# Patient Record
Sex: Male | Born: 1940 | Race: White | Hispanic: No | Marital: Married | Smoking: Current every day smoker
Health system: Southern US, Community
[De-identification: ages and names within clinical notes are randomized; demographics above are authoritative.]

## PROBLEM LIST (undated history)

## (undated) ENCOUNTER — Inpatient Hospital Stay: Admission: EM | Payer: Self-pay | Source: Home / Self Care

## (undated) DIAGNOSIS — E785 Hyperlipidemia, unspecified: Secondary | ICD-10-CM

## (undated) DIAGNOSIS — I10 Essential (primary) hypertension: Secondary | ICD-10-CM

## (undated) DIAGNOSIS — Z87442 Personal history of urinary calculi: Secondary | ICD-10-CM

## (undated) DIAGNOSIS — I714 Abdominal aortic aneurysm, without rupture, unspecified: Secondary | ICD-10-CM

## (undated) DIAGNOSIS — B9620 Unspecified Escherichia coli [E. coli] as the cause of diseases classified elsewhere: Secondary | ICD-10-CM

## (undated) DIAGNOSIS — T827XXA Infection and inflammatory reaction due to other cardiac and vascular devices, implants and grafts, initial encounter: Secondary | ICD-10-CM

## (undated) DIAGNOSIS — R7881 Bacteremia: Secondary | ICD-10-CM

## (undated) DIAGNOSIS — I219 Acute myocardial infarction, unspecified: Secondary | ICD-10-CM

## (undated) DIAGNOSIS — K5090 Crohn's disease, unspecified, without complications: Secondary | ICD-10-CM

## (undated) DIAGNOSIS — C801 Malignant (primary) neoplasm, unspecified: Secondary | ICD-10-CM

## (undated) DIAGNOSIS — C3490 Malignant neoplasm of unspecified part of unspecified bronchus or lung: Secondary | ICD-10-CM

## (undated) DIAGNOSIS — I739 Peripheral vascular disease, unspecified: Secondary | ICD-10-CM

## (undated) HISTORY — PX: CARDIAC CATHETERIZATION: SHX172

## (undated) HISTORY — PX: MOHS SURGERY: SUR867

## (undated) HISTORY — PX: CYSTOSCOPY: SUR368

## (undated) HISTORY — DX: Acute myocardial infarction, unspecified: I21.9

## (undated) HISTORY — DX: Infection and inflammatory reaction due to other cardiac and vascular devices, implants and grafts, initial encounter: T82.7XXA

## (undated) HISTORY — PX: CORONARY STENT PLACEMENT: SHX1402

## (undated) HISTORY — DX: Abdominal aortic aneurysm, without rupture: I71.4

## (undated) HISTORY — DX: Abdominal aortic aneurysm, without rupture, unspecified: I71.40

## (undated) HISTORY — PX: OTHER SURGICAL HISTORY: SHX169

## (undated) HISTORY — DX: Unspecified Escherichia coli (E. coli) as the cause of diseases classified elsewhere: B96.20

## (undated) HISTORY — DX: Bacteremia: R78.81

## (undated) HISTORY — DX: Peripheral vascular disease, unspecified: I73.9

## (undated) HISTORY — DX: Essential (primary) hypertension: I10

---

## 1998-06-16 ENCOUNTER — Emergency Department (HOSPITAL_COMMUNITY): Admission: EM | Admit: 1998-06-16 | Discharge: 1998-06-16 | Payer: Self-pay | Admitting: Emergency Medicine

## 1998-06-21 ENCOUNTER — Encounter: Payer: Self-pay | Admitting: Emergency Medicine

## 1998-06-21 ENCOUNTER — Encounter: Payer: Self-pay | Admitting: Urology

## 1998-06-21 ENCOUNTER — Observation Stay (HOSPITAL_COMMUNITY): Admission: EM | Admit: 1998-06-21 | Discharge: 1998-06-21 | Payer: Self-pay | Admitting: Emergency Medicine

## 2000-06-22 ENCOUNTER — Other Ambulatory Visit: Admission: RE | Admit: 2000-06-22 | Discharge: 2000-06-22 | Payer: Self-pay | Admitting: Gastroenterology

## 2000-06-30 ENCOUNTER — Ambulatory Visit (HOSPITAL_COMMUNITY): Admission: RE | Admit: 2000-06-30 | Discharge: 2000-06-30 | Payer: Self-pay | Admitting: Gastroenterology

## 2000-06-30 ENCOUNTER — Encounter (INDEPENDENT_AMBULATORY_CARE_PROVIDER_SITE_OTHER): Payer: Self-pay | Admitting: *Deleted

## 2004-09-28 DIAGNOSIS — I219 Acute myocardial infarction, unspecified: Secondary | ICD-10-CM

## 2004-09-28 HISTORY — DX: Acute myocardial infarction, unspecified: I21.9

## 2005-08-11 ENCOUNTER — Ambulatory Visit: Payer: Self-pay | Admitting: Cardiology

## 2005-08-11 ENCOUNTER — Inpatient Hospital Stay (HOSPITAL_COMMUNITY): Admission: AD | Admit: 2005-08-11 | Discharge: 2005-08-13 | Payer: Self-pay | Admitting: Cardiology

## 2005-08-13 IMAGING — CR DG CHEST 2V
3 series · 3 of 3 positions shown · non-contrast
Comparison: None.
CHEST - 2 VIEW:

CLINICAL DATA: Stent placement.

[view not recorded (1 of 3)]
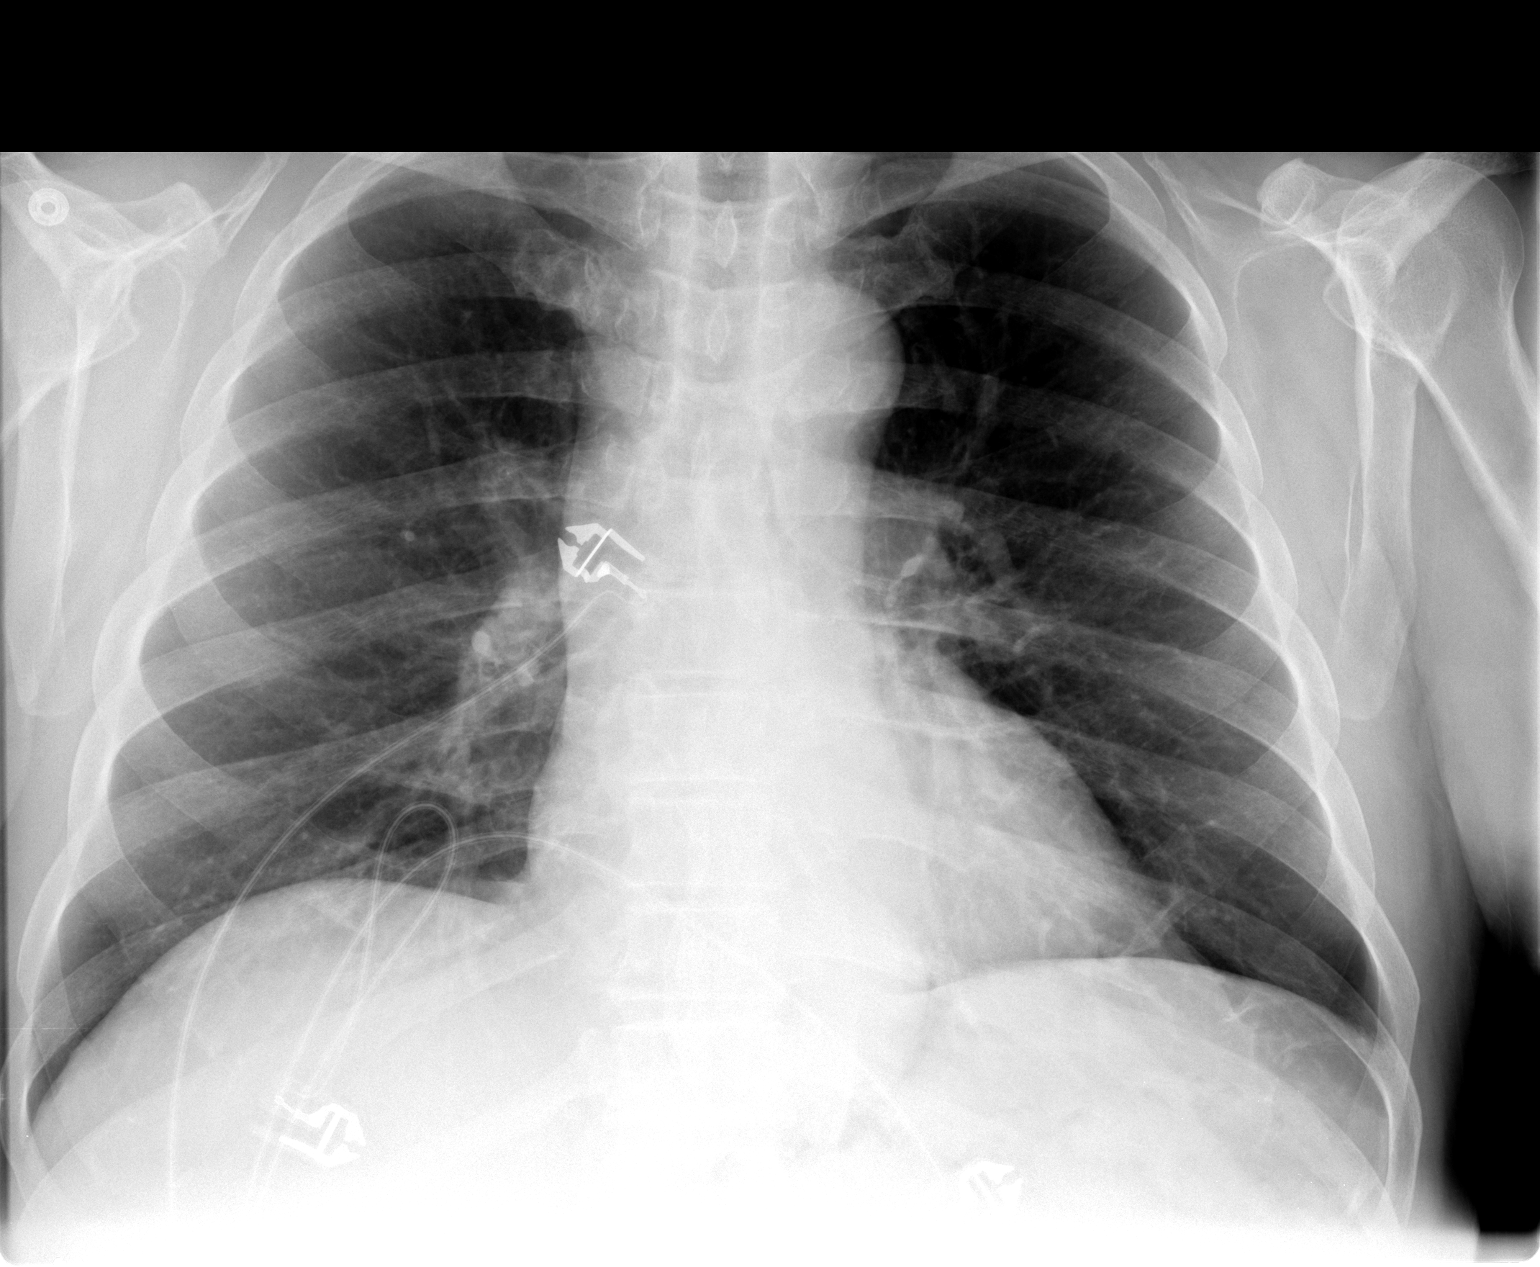

[view not recorded (2 of 3)]
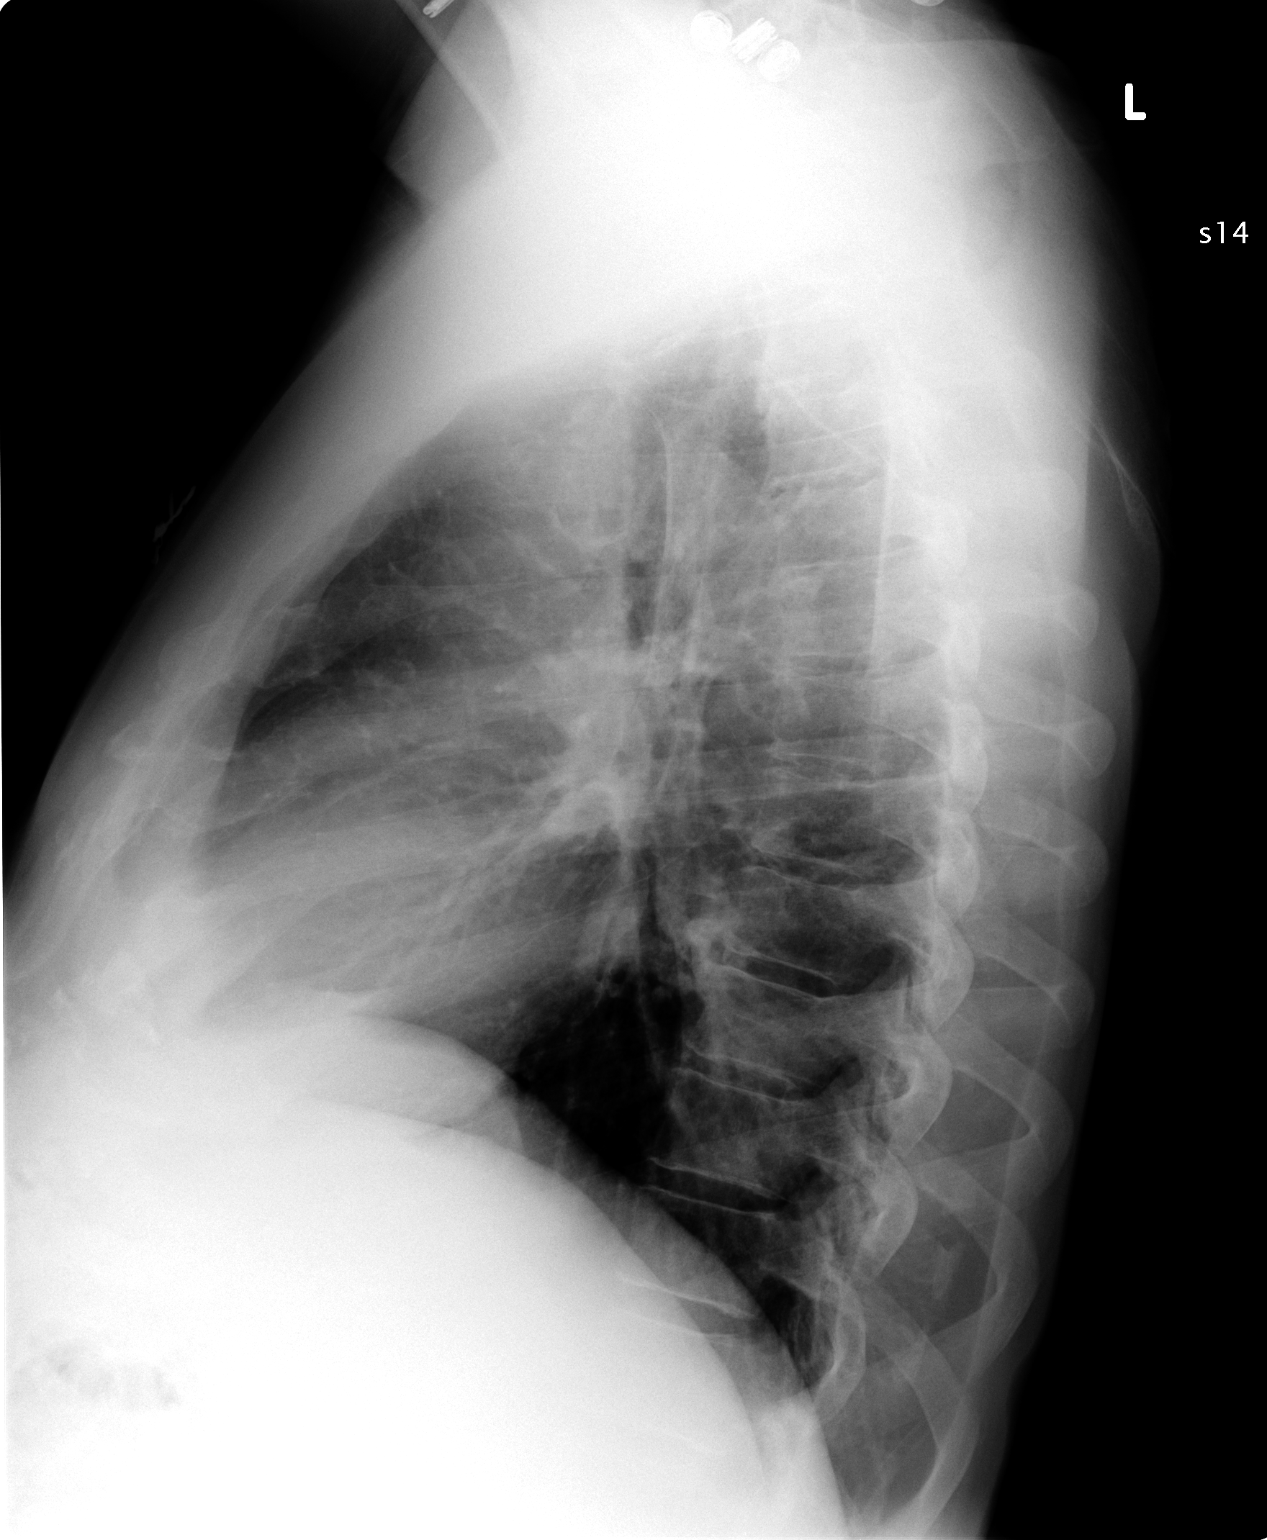

[view not recorded (3 of 3)]
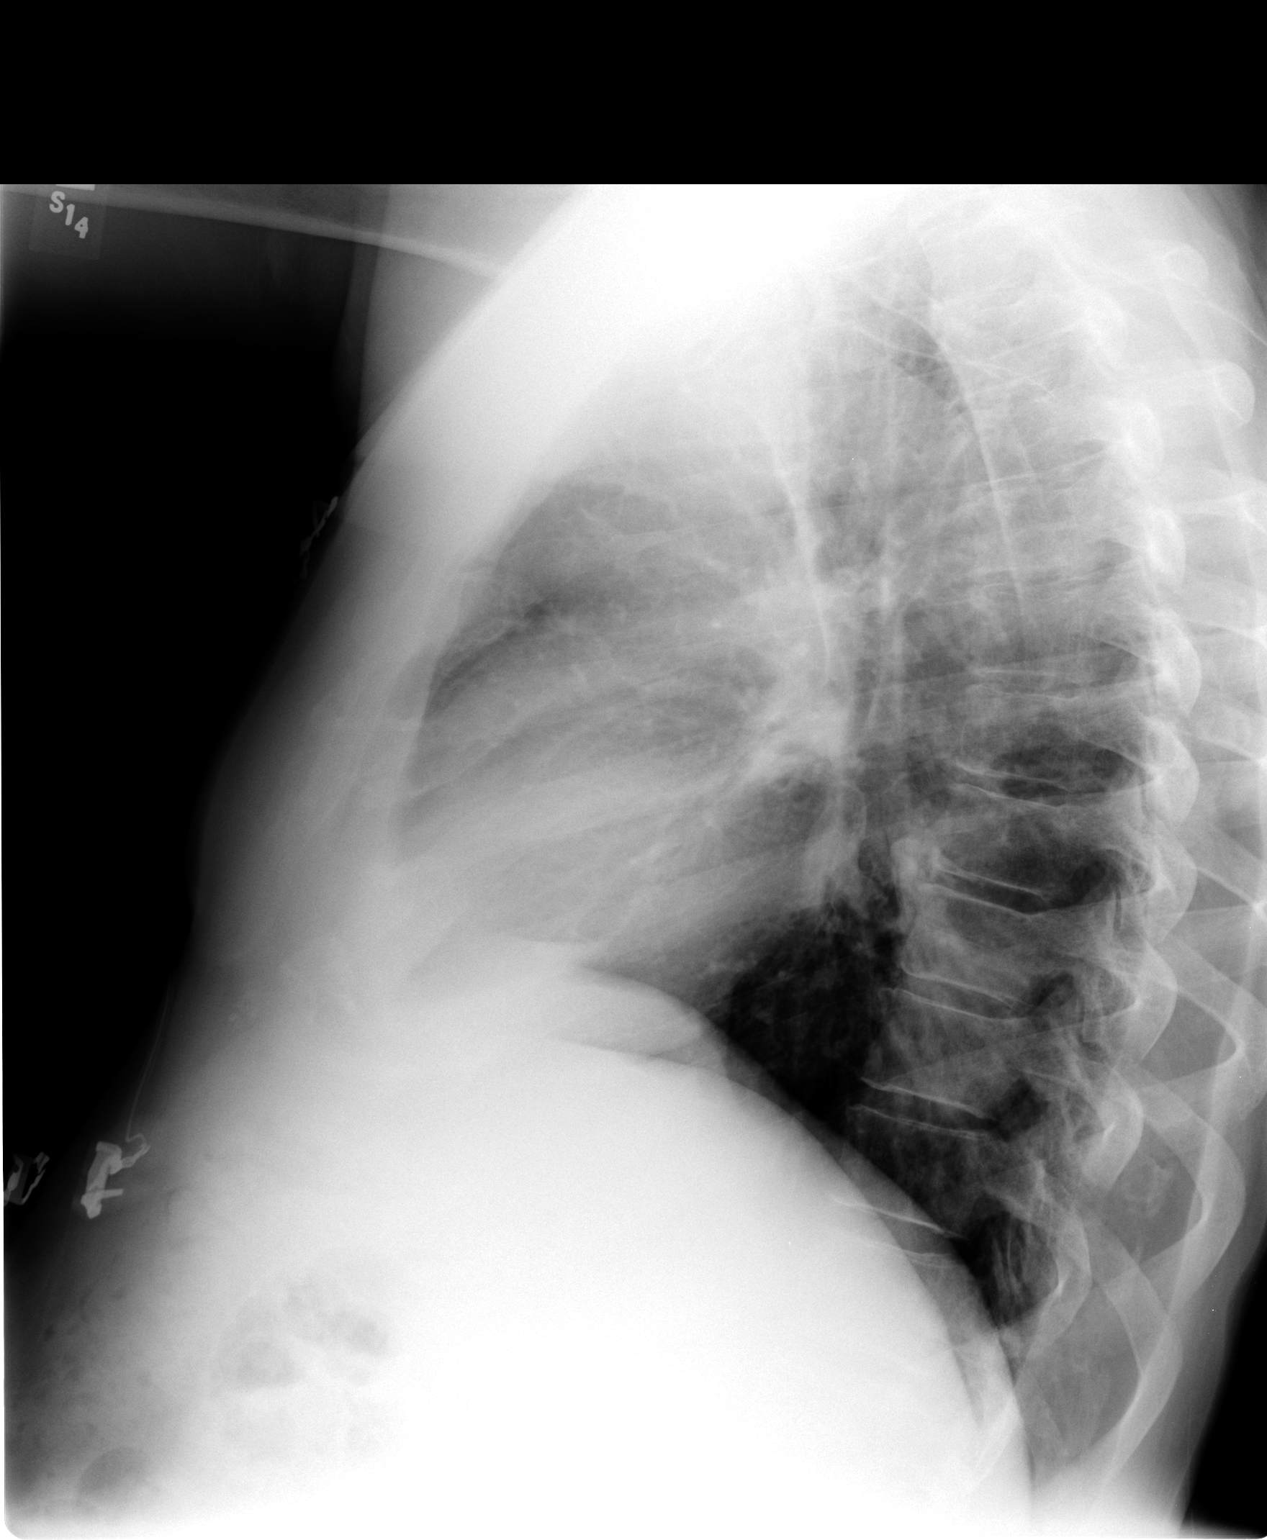

[3 of 3 positions shown; findings below may reference images not displayed]

FINDINGS: Lungs are clear.  Heart size is normal.  No effusion.  Rounded opacity in the lower chest is most compatible with a hiatal hernia.
IMPRESSION: No acute disease with a rounded opacity in the lower chest, most consistent with a hiatal hernia.

## 2005-08-28 ENCOUNTER — Ambulatory Visit: Payer: Self-pay | Admitting: Cardiovascular Disease

## 2006-01-27 ENCOUNTER — Encounter: Payer: Self-pay | Admitting: Emergency Medicine

## 2006-07-27 ENCOUNTER — Ambulatory Visit: Payer: Self-pay | Admitting: Cardiology

## 2010-02-26 DIAGNOSIS — I739 Peripheral vascular disease, unspecified: Secondary | ICD-10-CM

## 2010-02-26 HISTORY — DX: Peripheral vascular disease, unspecified: I73.9

## 2010-03-03 HISTORY — PX: ABDOMINAL AORTIC ANEURYSM REPAIR: SUR1152

## 2010-03-09 ENCOUNTER — Emergency Department (HOSPITAL_COMMUNITY): Admission: EM | Admit: 2010-03-09 | Discharge: 2010-03-09 | Payer: Self-pay | Admitting: Emergency Medicine

## 2010-03-09 IMAGING — CT CT ANGIO PELVIS
3 of 11 series · 11 of 46 positions shown, 17 images · IV contrast (APPLIED)
Comparison: None.

CTA ABDOMEN

CLINICAL DATA: THE PATIENT IS POSTOP DAY #6 FROM LOBE AND ABDOMINAL
AORTIC ANEURYSM REPAIR.  HE SNEEZED EARLIER TODAY AND SOME FLUID
LEAKED FROM THE INCISION SITE.

CT ANGIOGRAPHY OF ABDOMEN AND PELVIS WITHOUT AND/OR WITH CONTRAST -
AAA PROTOCOL
TECHNIQUE: Multidetector CT imaging of the abdomen and pelvis was
performed before and during bolus injection of intravenous
contrast.  Multiplanar CT angiographic image reconstructions were
also generated to evaluate the vascular structures.
Contrast: 100 ml [YN]

[Series 6: dissection 2.0 st · axial · 0.74mm/px · z∈[-514,-258]mm · 5 of 276 slices shown]
[im 19/276  soft-tissue]
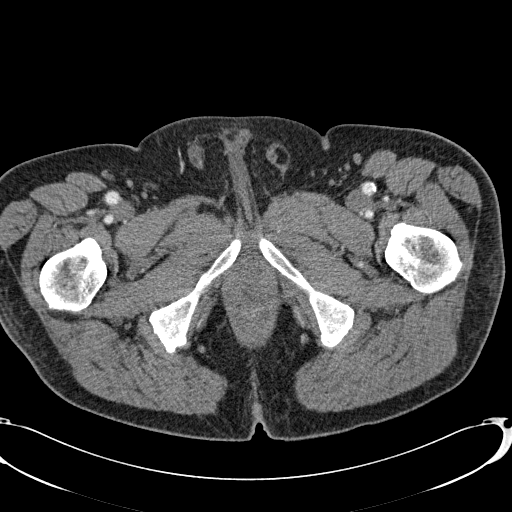
[im 56/276  soft-tissue]
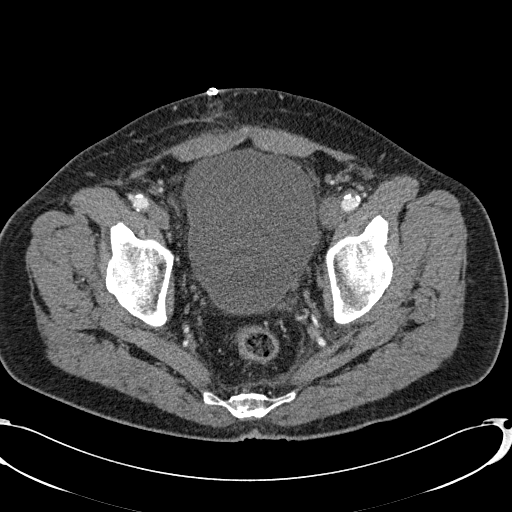
[im 92/276  soft-tissue]
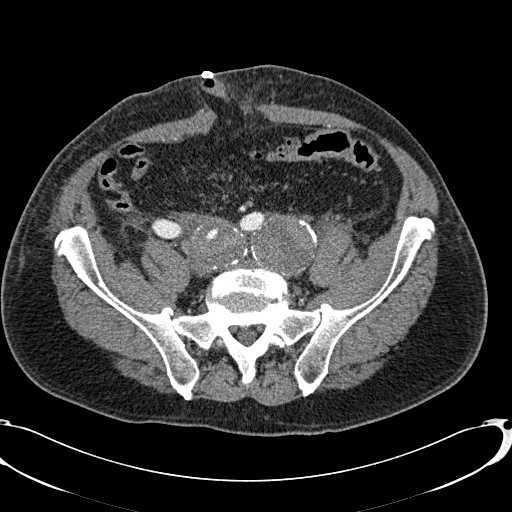
[im 129/276  soft-tissue]
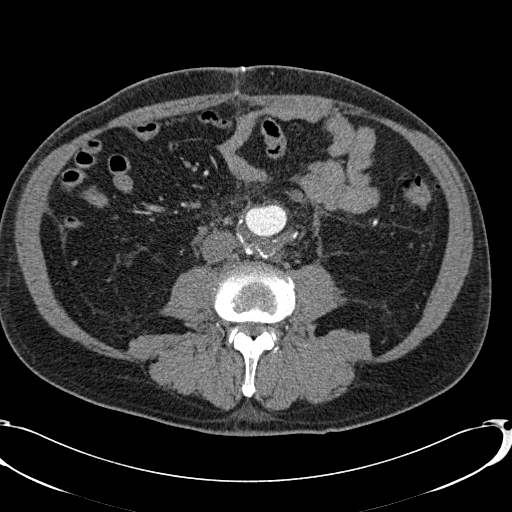
[im 147/276  soft-tissue]
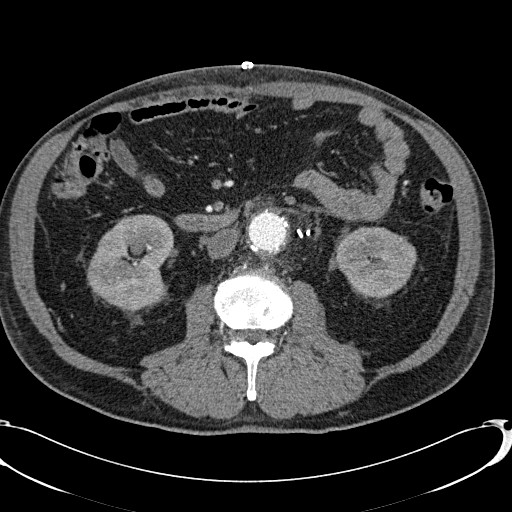

[Series 9: venous 5.0 st · axial · portal-venous · 0.74mm/px · z∈[-460,-96]mm · 5 of 111 slices shown, 10 images]
[im 19/111  soft-tissue]
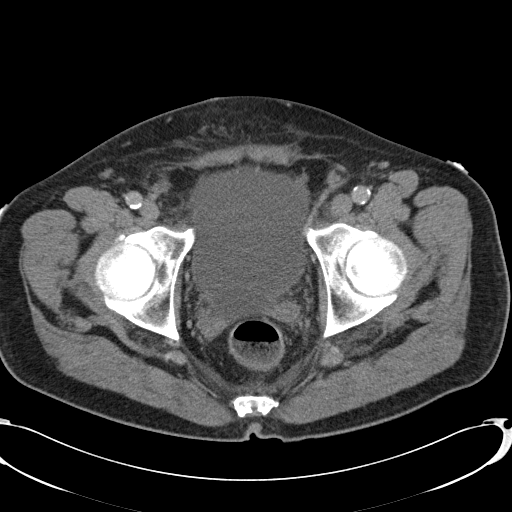
[im 19/111  bone]
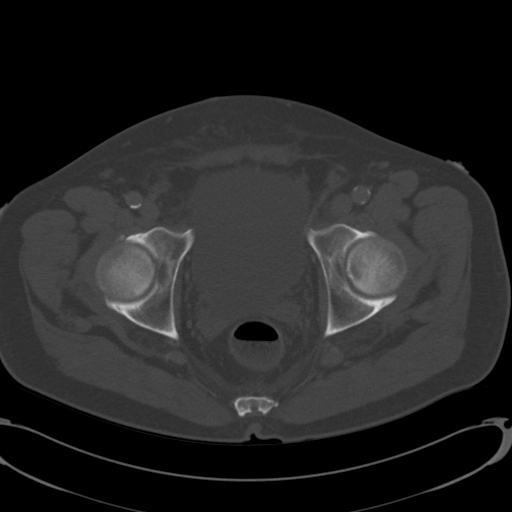
[im 37/111  soft-tissue]
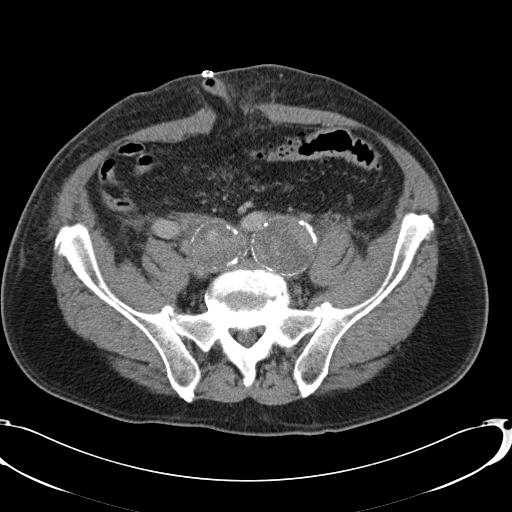
[im 37/111  lung]
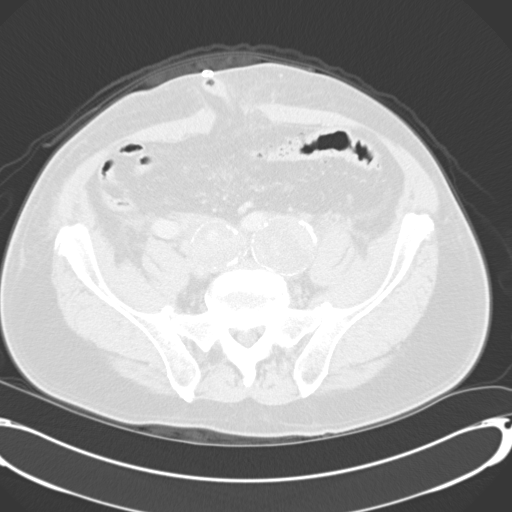
[im 56/111  soft-tissue]
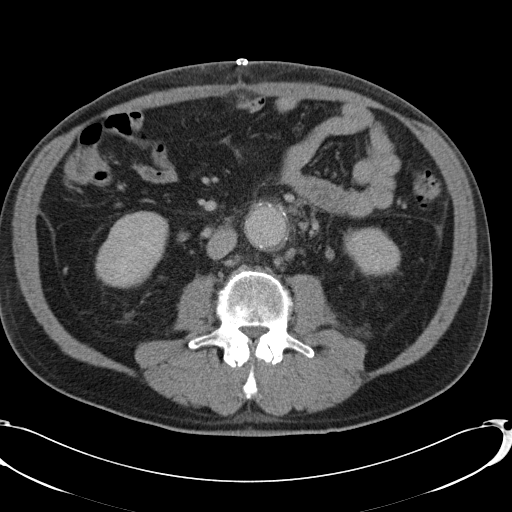
[im 56/111  lung]
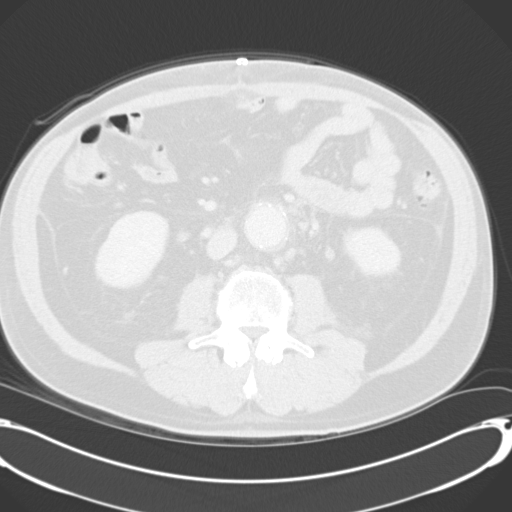
[im 74/111  soft-tissue]
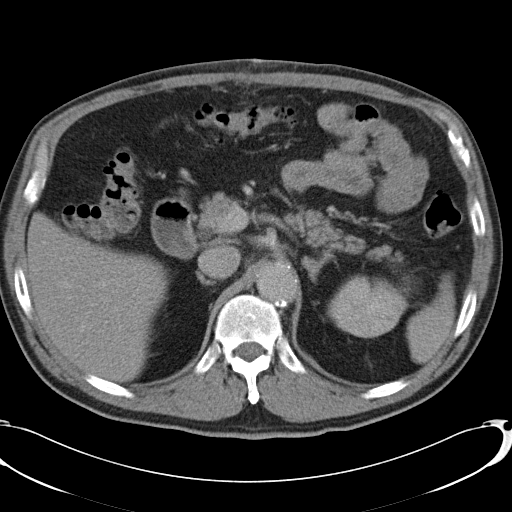
[im 74/111  lung]
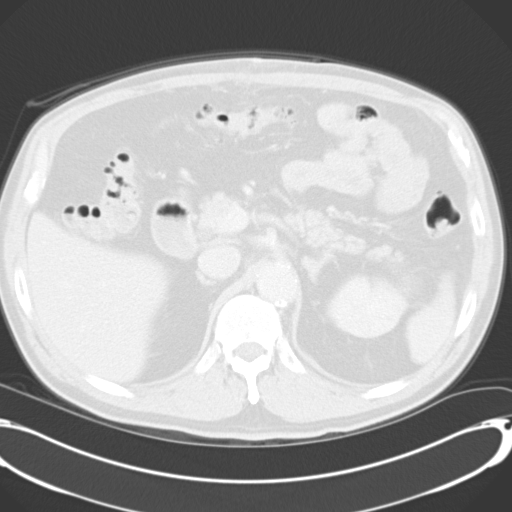
[im 92/111  soft-tissue]
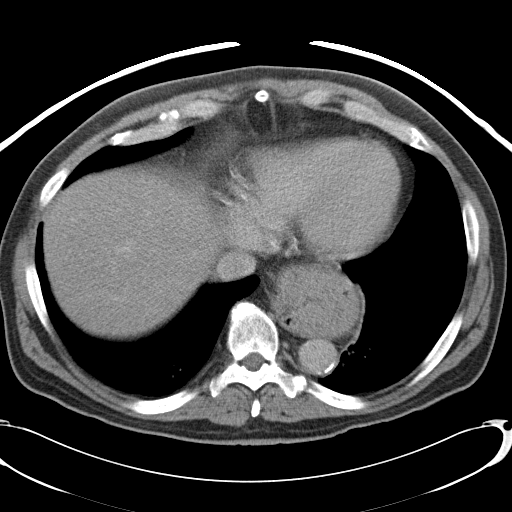
[im 92/111  lung]
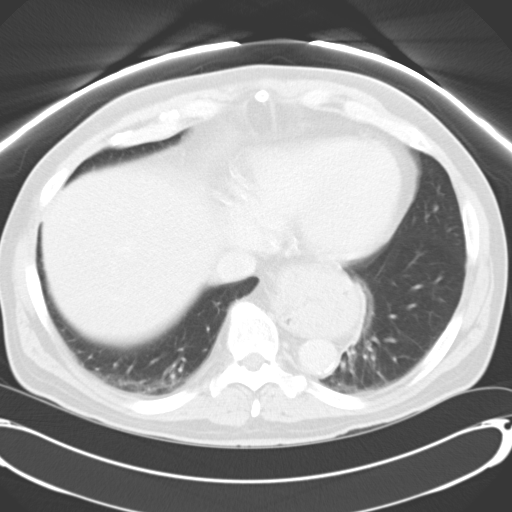

[Series 608: cor a/p venous · coronal · portal-venous · 1.08mm/px · 1 of 92 slices shown, 2 images]
[im 46/92  soft-tissue]
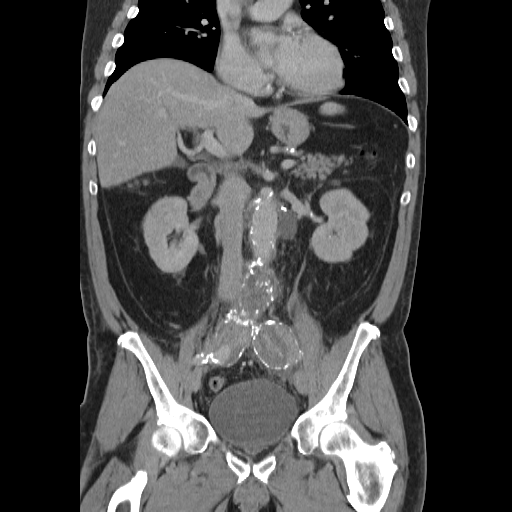
[im 46/92  bone]
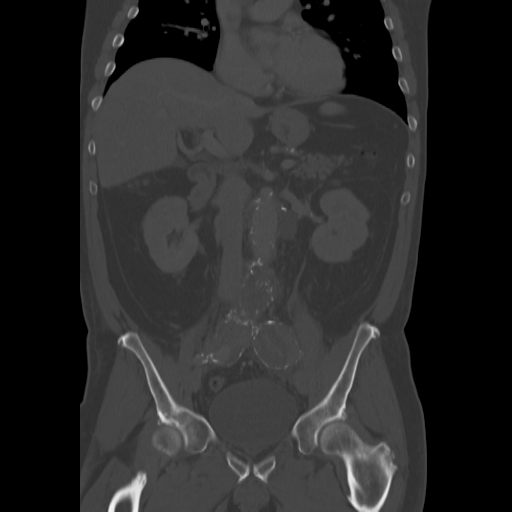

[11 of 46 positions shown; findings below may reference images not displayed]

FINDINGS: There is some minimal dependent atelectasis in the lower
lobes bilaterally.  The patient is status post aorto bi-iliac
bypass grafting.  Proximal anastomoses is at the level of the renal
arteries.  Both renal arteries are patent and renal perfusion is
symmetric.  There is some subtle irregularity at the anastomosis,
but not unexpected for 6 days from surgery.  2 x 3.8 cm fluid
collection is seen to the left of the aorta distal to the renal
arteries.  This contains some air locules.  The collection probably
represents a postsurgical seroma or hematoma and the gas is not
unexpected 6 days from surgery.  Opacification of the graft lumen
and iliac limbs is evident.  Distal anastomoses are patent
bilaterally.  The patient has bilateral common iliac artery
aneurysms and there is retrograde opacification of the right iliac
aneurysm.  Gas and thrombus are visible in the aneurysm sacs
bilaterally, probably related to the recent surgery.

No focal abnormalities seen in the liver or spleen.  Moderate
hiatal hernia is evident.  Duodenum, pancreas, gallbladder, and
adrenal glands have normal features.  The slightly heterogeneous
perfusion of the right renal cortex is probably related to recent
surgery.

No intraperitoneal free fluid.  No free air is identified in the
peroneal cavity.  There is no bowel obstruction.

The patient is noted to have a fascial defect at the midline, deep
to the midline incision.  Small bowel loops extend through the
fascial defects to a position just deep to the skin.
IMPRESSION: Status post aortobi-iliac bypass grafting.  The proximal and distal
anastomoses are patent.  There is some fluid and gas in the
retroperitoneal tissues around the graft which is not unexpected 6
days surgery.  The patient does have some retrograde opacification
of the right common iliac artery.

Midline fascial defect caudal to the umbilicus contains herniated
small bowel loops which track just deep to the midline skin
staples. No underlying subcutaneous fluid collection associated
with the staple line to suggest abscess or hematoma.

CTA PELVIS
FINDINGS: Symmetric opacification of the common femoral arteries is
noted.  There is no intraperitoneal free fluid.  Tiny air locule in
the bladder is probably secondary to recent instrumentation.  The
bilateral inguinal hernias contain only fat.  There is
diverticulosis of the sigmoid colon without diverticulitis.  The
terminal ileum is normal.  The appendix is normal.

Bone windows reveal no worrisome lytic or sclerotic osseous
lesions.
IMPRESSION: No acute findings in the pelvis.

## 2010-03-16 ENCOUNTER — Inpatient Hospital Stay (HOSPITAL_COMMUNITY): Admission: EM | Admit: 2010-03-16 | Discharge: 2010-03-18 | Payer: Self-pay | Admitting: Emergency Medicine

## 2010-03-16 IMAGING — CT CT ABD-PELV W/ CM
2 of 5 series · 15 of 46 positions shown, 17 images · IV contrast (agent unspecified)
Comparison: [DATE]

CLINICAL DATA: Abdominal distention.  Incision is draining.  Nausea
vomiting.

CT ABDOMEN AND PELVIS WITH CONTRAST
TECHNIQUE: Multidetector CT imaging of the abdomen and pelvis was
performed following the standard protocol during bolus
administration of intravenous contrast.
Contrast: 80 ml [W6]

[Series 2: rtn ap with st · axial · 0.82mm/px · z∈[-440,-10]mm · 12 of 96 slices shown, 14 images]
[im 5/96  soft-tissue]
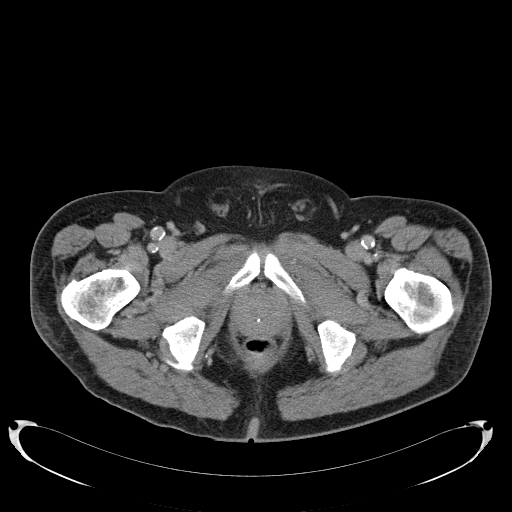
[im 5/96  bone]
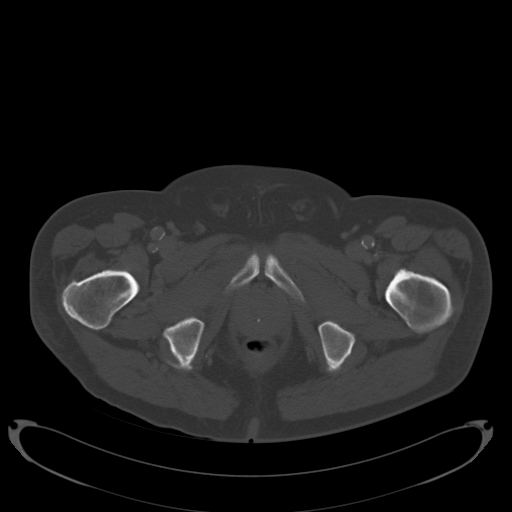
[im 15/96  soft-tissue]
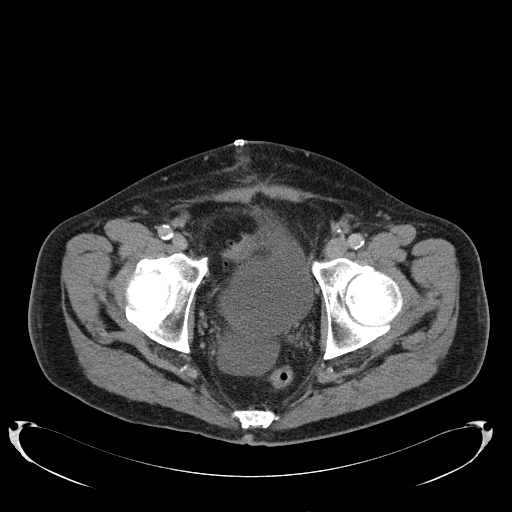
[im 20/96  soft-tissue]
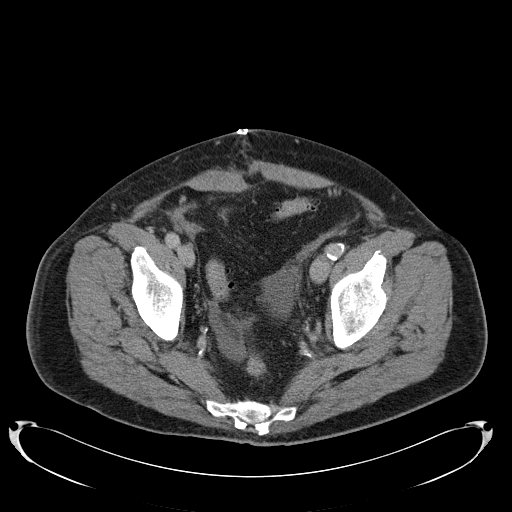
[im 29/96  soft-tissue]
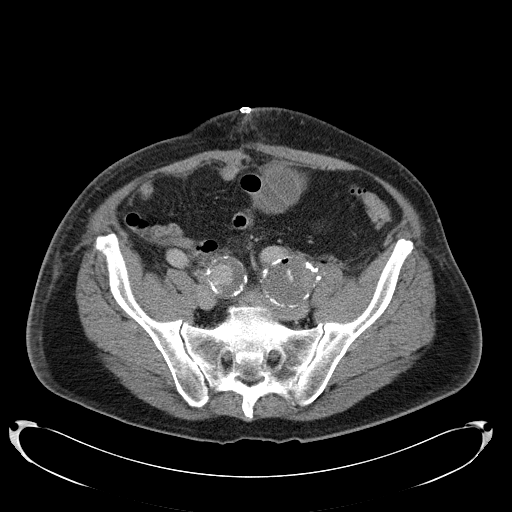
[im 39/96  soft-tissue]
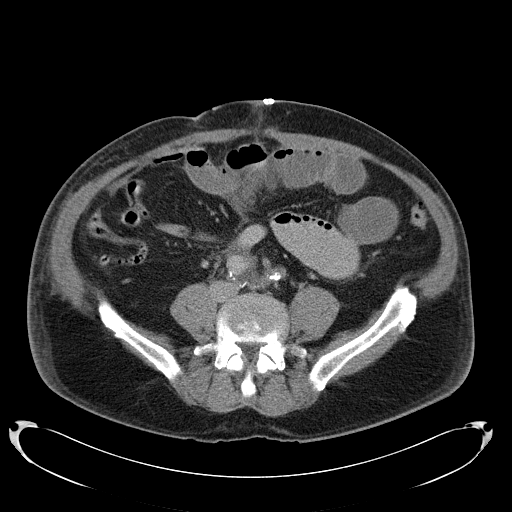
[im 43/96  soft-tissue]
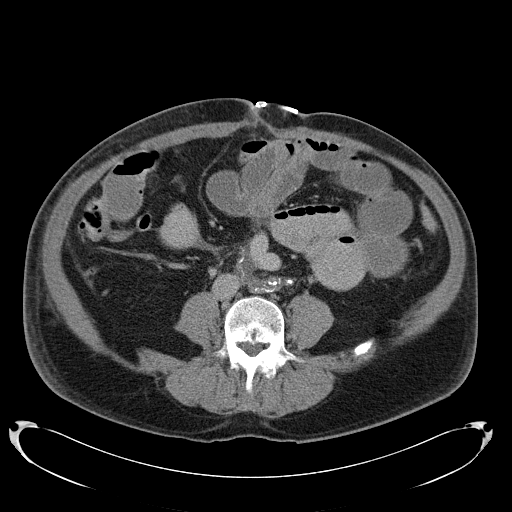
[im 53/96  soft-tissue]
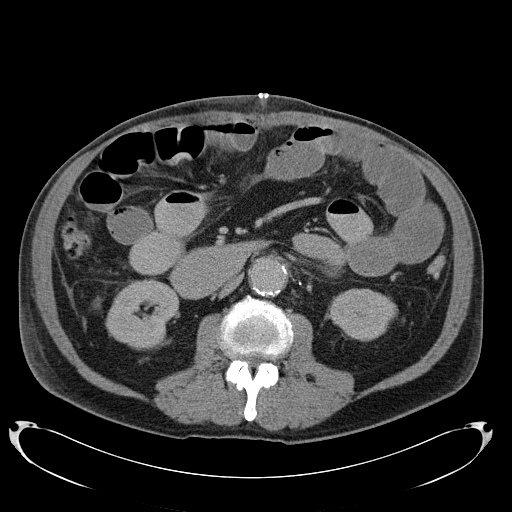
[im 58/96  soft-tissue]
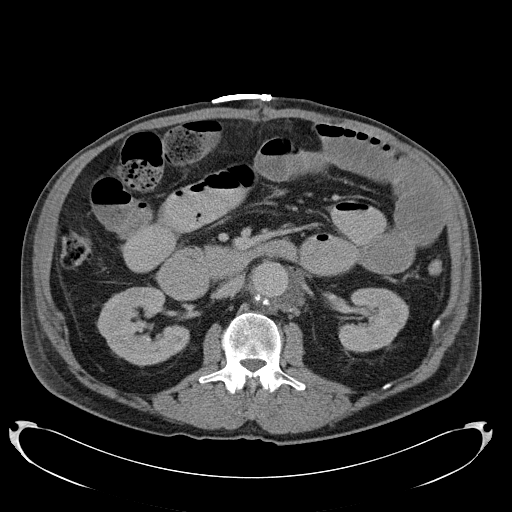
[im 67/96  soft-tissue]
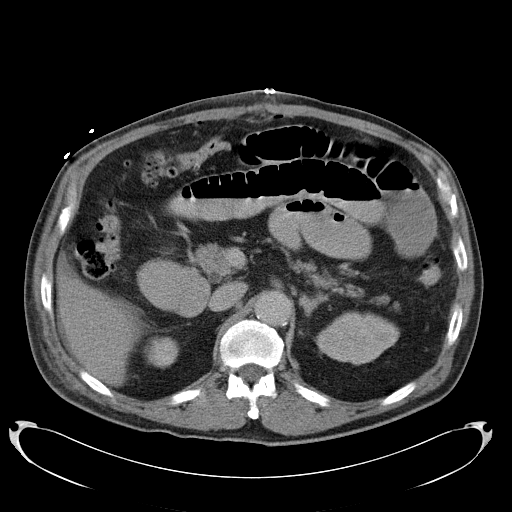
[im 67/96  bone]
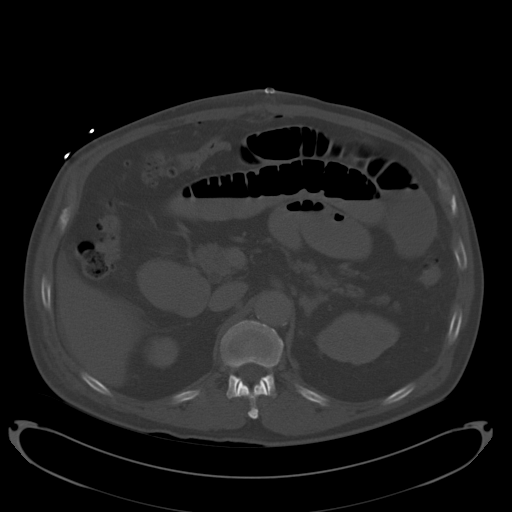
[im 77/96  soft-tissue]
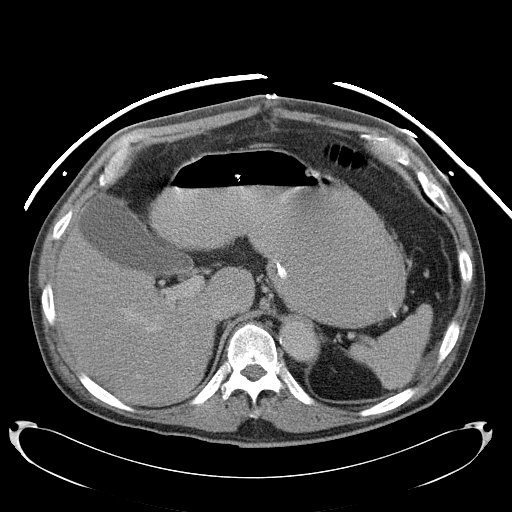
[im 81/96  soft-tissue]
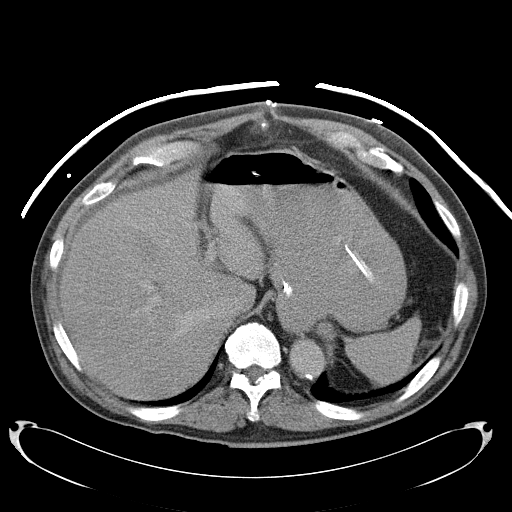
[im 91/96  soft-tissue]
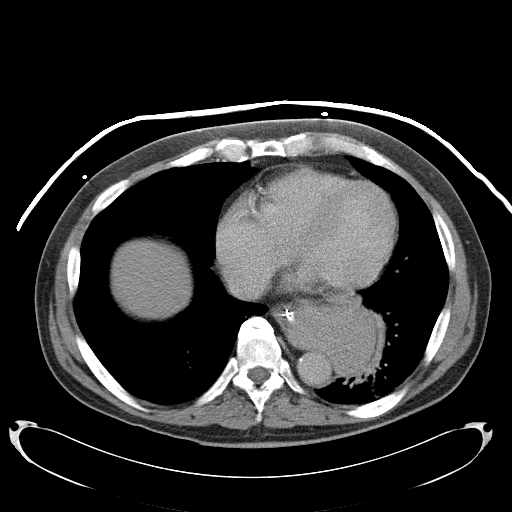

[Series 602: coronal · coronal · 0.97mm/px · 3 of 94 slices shown]
[im 32/94  soft-tissue]
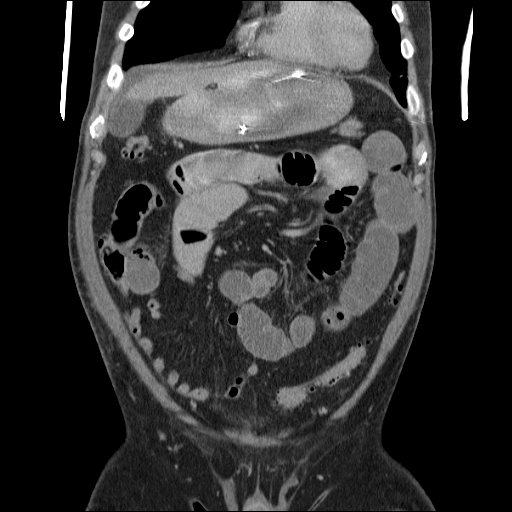
[im 42/94  soft-tissue]
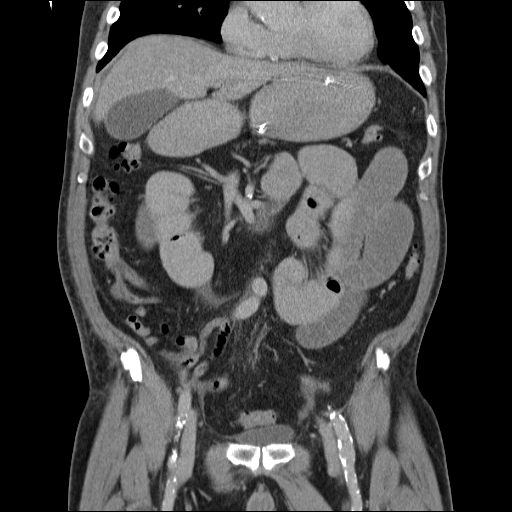
[im 52/94  soft-tissue]
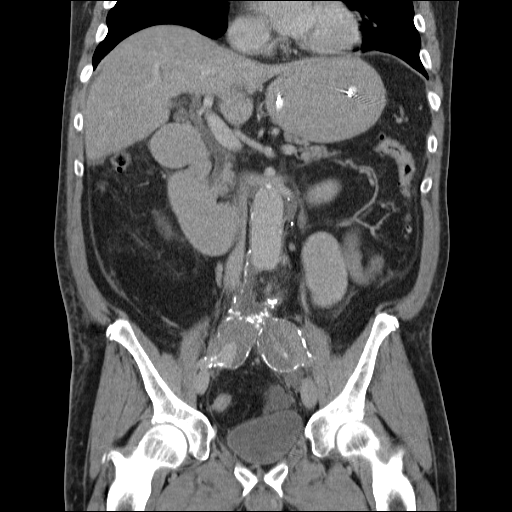

[15 of 46 positions shown; findings below may reference images not displayed]

FINDINGS: Posterior left lower lobe atelectasis is evident.

No focal abnormalities seen in the liver or spleen.  Moderate
hiatal hernia is evident.  NG tube is in the gastric fundus.
Stomach is mildly distended.  Duodenum is mildly distended.
Jejunal loops in the left upper quadrant are distended up to 4 cm
in diameter and are fluid filled.

Retention sutures are now seen at the midline.  By report, the
patient was reoperated on [DATE] for ventral hernia repair.
Numerous tiny air locules under the rectus fascia are not
unexpected 6 days out from surgery.  The herniated bowel loops seen
in the region of the midline incision on the previous study have
been reduced in the interval.  A tiny 2.8 x 0.9 cm fluid collection
is seen just deep to the midline rectus sheath, about 4 cm cranial
to the umbilicus.  This shows no rim enhancement or organization.

The dilated small bowel loops tracking into the central abdomen
where they become decompressed.  The precise transition zone is not
identified, but there is a region of adhesions in the central
abdomen, at about the level of the umbilicus, at the midline.  This
is immediately anterior to the proximal right iliac graft.  Small
bowel loops in the right lower quadrant are collapsed and
decompressed.  The colon is also decompressed.  A small bowel feces
sign is identified in the anterior right abdomen, in the region of
the small bowel transition.

The left para-aortic fluid collection, in the region of the
proximal anastomoses, has decreased in size in the interval,
measuring 13 x 32 mm today compared to 20 x 38 mm previously.
Similarly, the fluid which was seen around the graft bifurcation on
the previous study has decreased.  The gas locules seen in the
native common iliac artery aneurysms on the previous exam have also
decreased although there is still some residual gas on the left.
As before, there is retrograde filling of the right common iliac
artery aneurysm.

Small to moderate amount of free fluid is seen in the pelvis.  The
bladder is distended.  Prostate gland is enlarged.  Diffuse
diverticulosis of the sigmoid colon noted without diverticulitis.
IMPRESSION: Features consistent with small bowel obstruction.  Although the
exact transition point cannot be identified, it appears to be
within the central abdomen, towards the midline, at about the level
of the umbilicus.  There is evidence of adhesions in this region
and the adhesions are immediately anterior to the proximal right
common iliac artery graft.  No associated small bowel wall
thickening at this time.  No pneumatosis.

Small to moderate free fluid in the pelvis.

Intraperitoneal free air locules deep to the rectus sheath are not
unexpected 6 days out from revision of the patient's midline
incision.  Retention sutures are now visible.  There is no evidence
for bowel herniation through the rectus fascia as was seen on the
previous study.  A very tiny fluid collection is seen in the
intraperitoneal cavity, just deep to the incision as described
above.

## 2010-03-16 IMAGING — CR DG ABDOMEN ACUTE W/ 1V CHEST
4 series · 4 of 4 positions shown · non-contrast
Comparison: Comparison chest x-ray [DATE].  Comparison CT of
the abdomen pelvis [DATE].

CLINICAL DATA: Infusion the drainage.  Status post abdominal aortic
aneurysm repair

ACUTE ABDOMEN SERIES (ABDOMEN 2 VIEW & CHEST 1 VIEW)

[w chest pa]
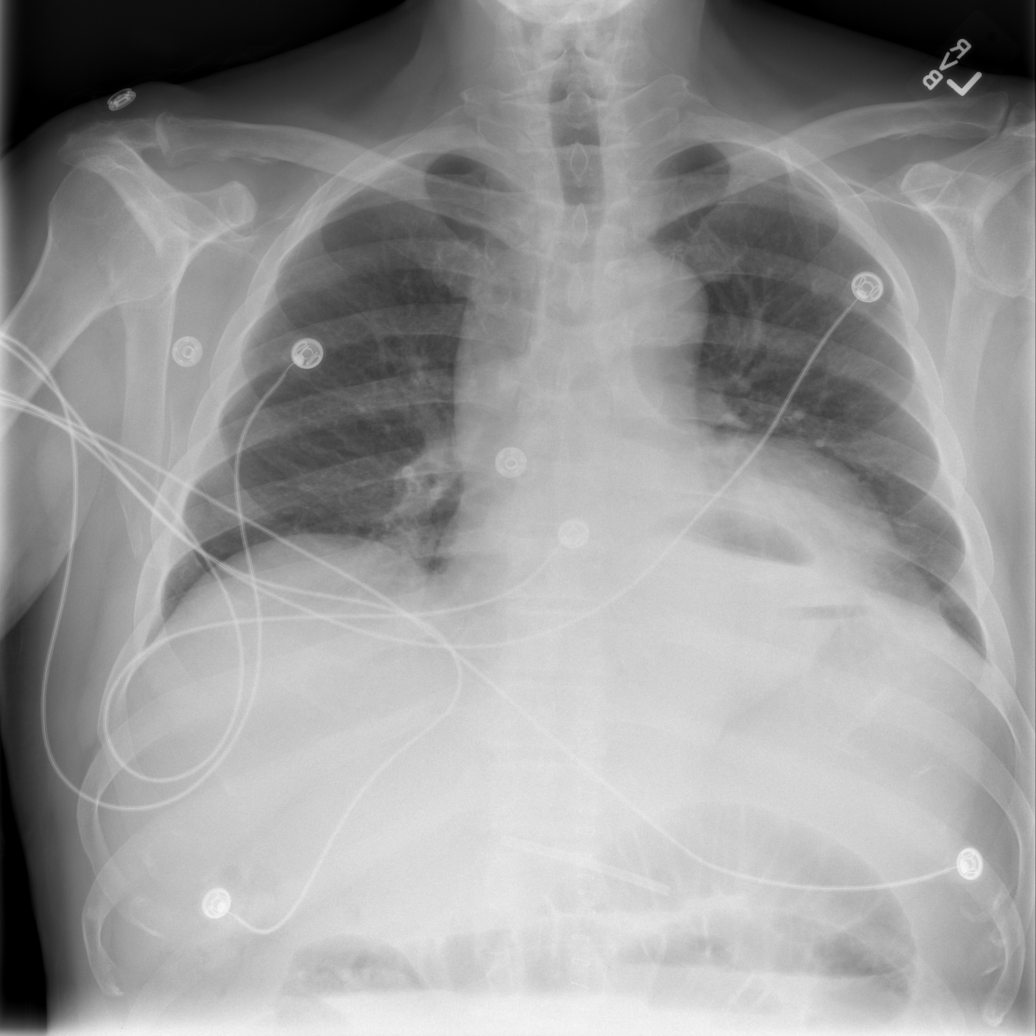

[w abdomen upright *]
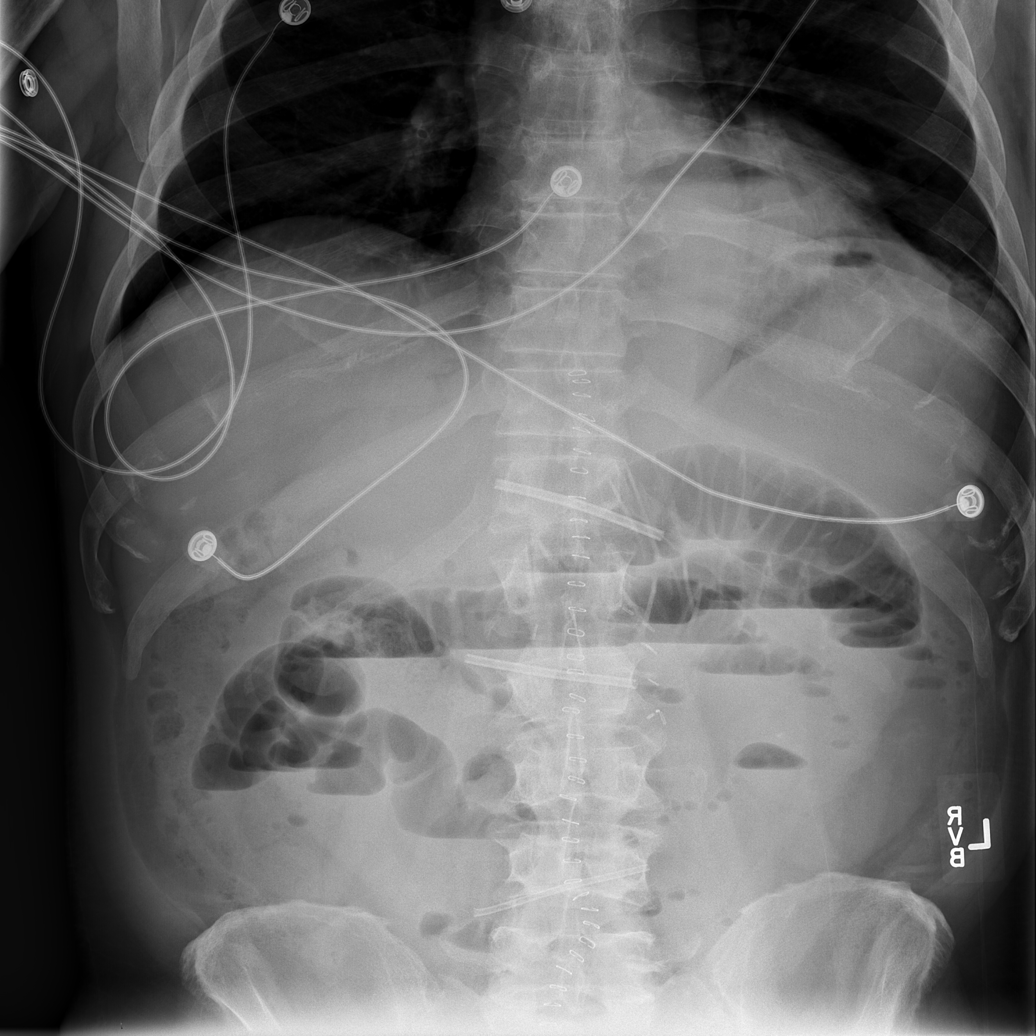

[t abdomen supine (1 of 2)]
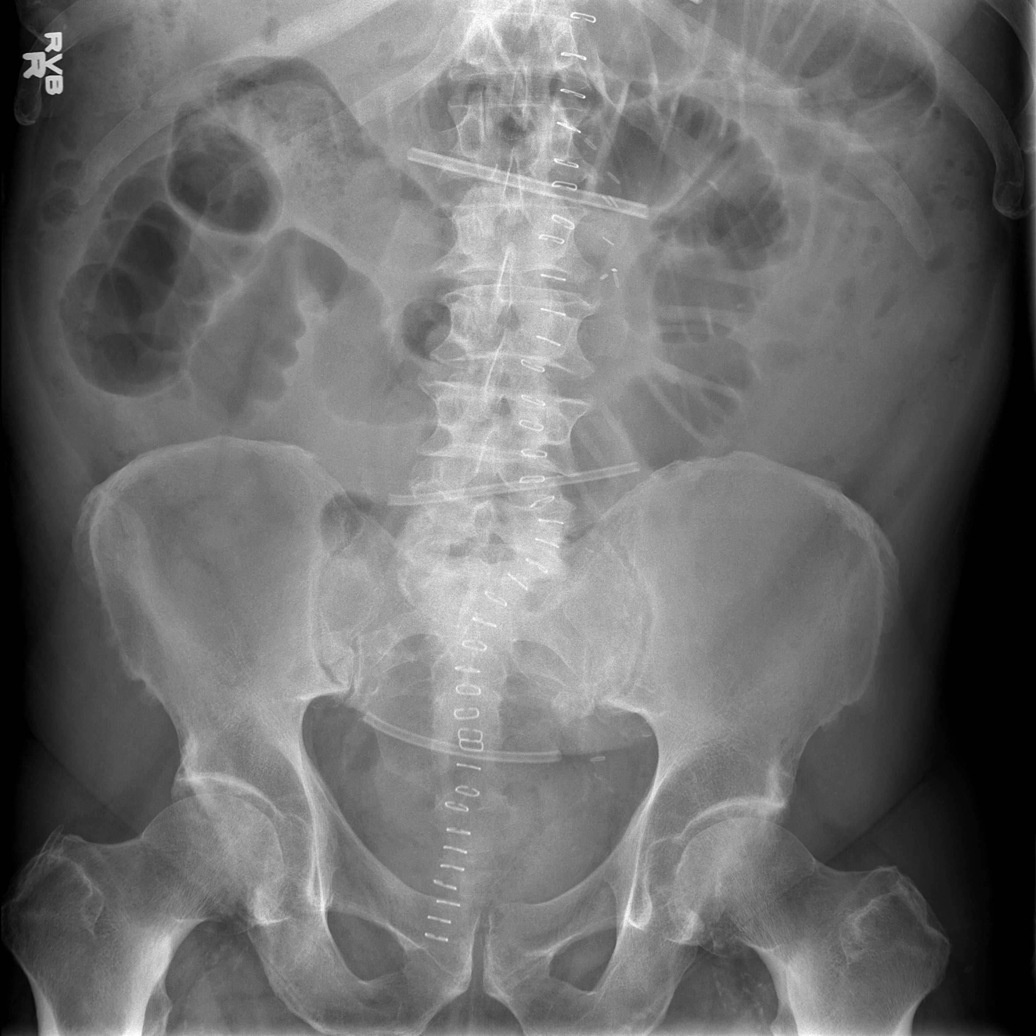

[t abdomen supine (2 of 2)]
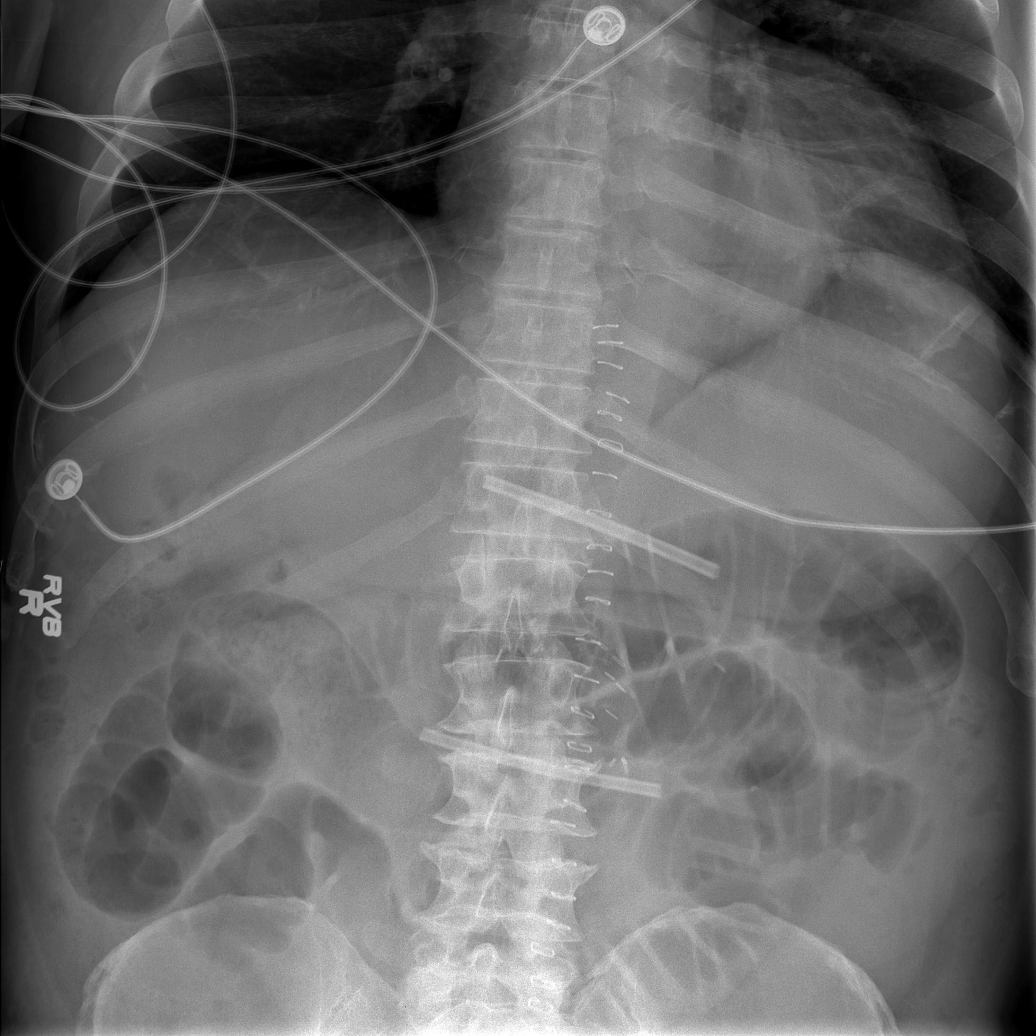

[4 of 4 positions shown; findings below may reference images not displayed]

FINDINGS: Cardiomegaly.  Tortuous aorta.  Basilar subsegmental
atelectasis.

Abnormal bowel gas pattern suggestive of small bowel obstruction
without free intraperitoneal air.  The small bowel loops measure up
to 5.5 cm.  On the prior CT, small bowel loops traversed through
anterior incision.  This cannot be assessed on the frontal views.

Hiatal hernia.
IMPRESSION: Small bowel obstructive pattern without free intraperitoneal air.
Please see above discussion.

Hiatal hernia.

Cardiomegaly.

Mildly tortuous aorta.

## 2010-06-10 ENCOUNTER — Encounter: Admission: RE | Admit: 2010-06-10 | Discharge: 2010-06-10 | Payer: Self-pay | Admitting: Family Medicine

## 2010-06-10 IMAGING — CR DG CHEST 2V
2 series · 2 of 2 positions shown · non-contrast
Comparison: [DATE]

CLINICAL DATA: Fever

CHEST - 2 VIEW

[w chest pa]
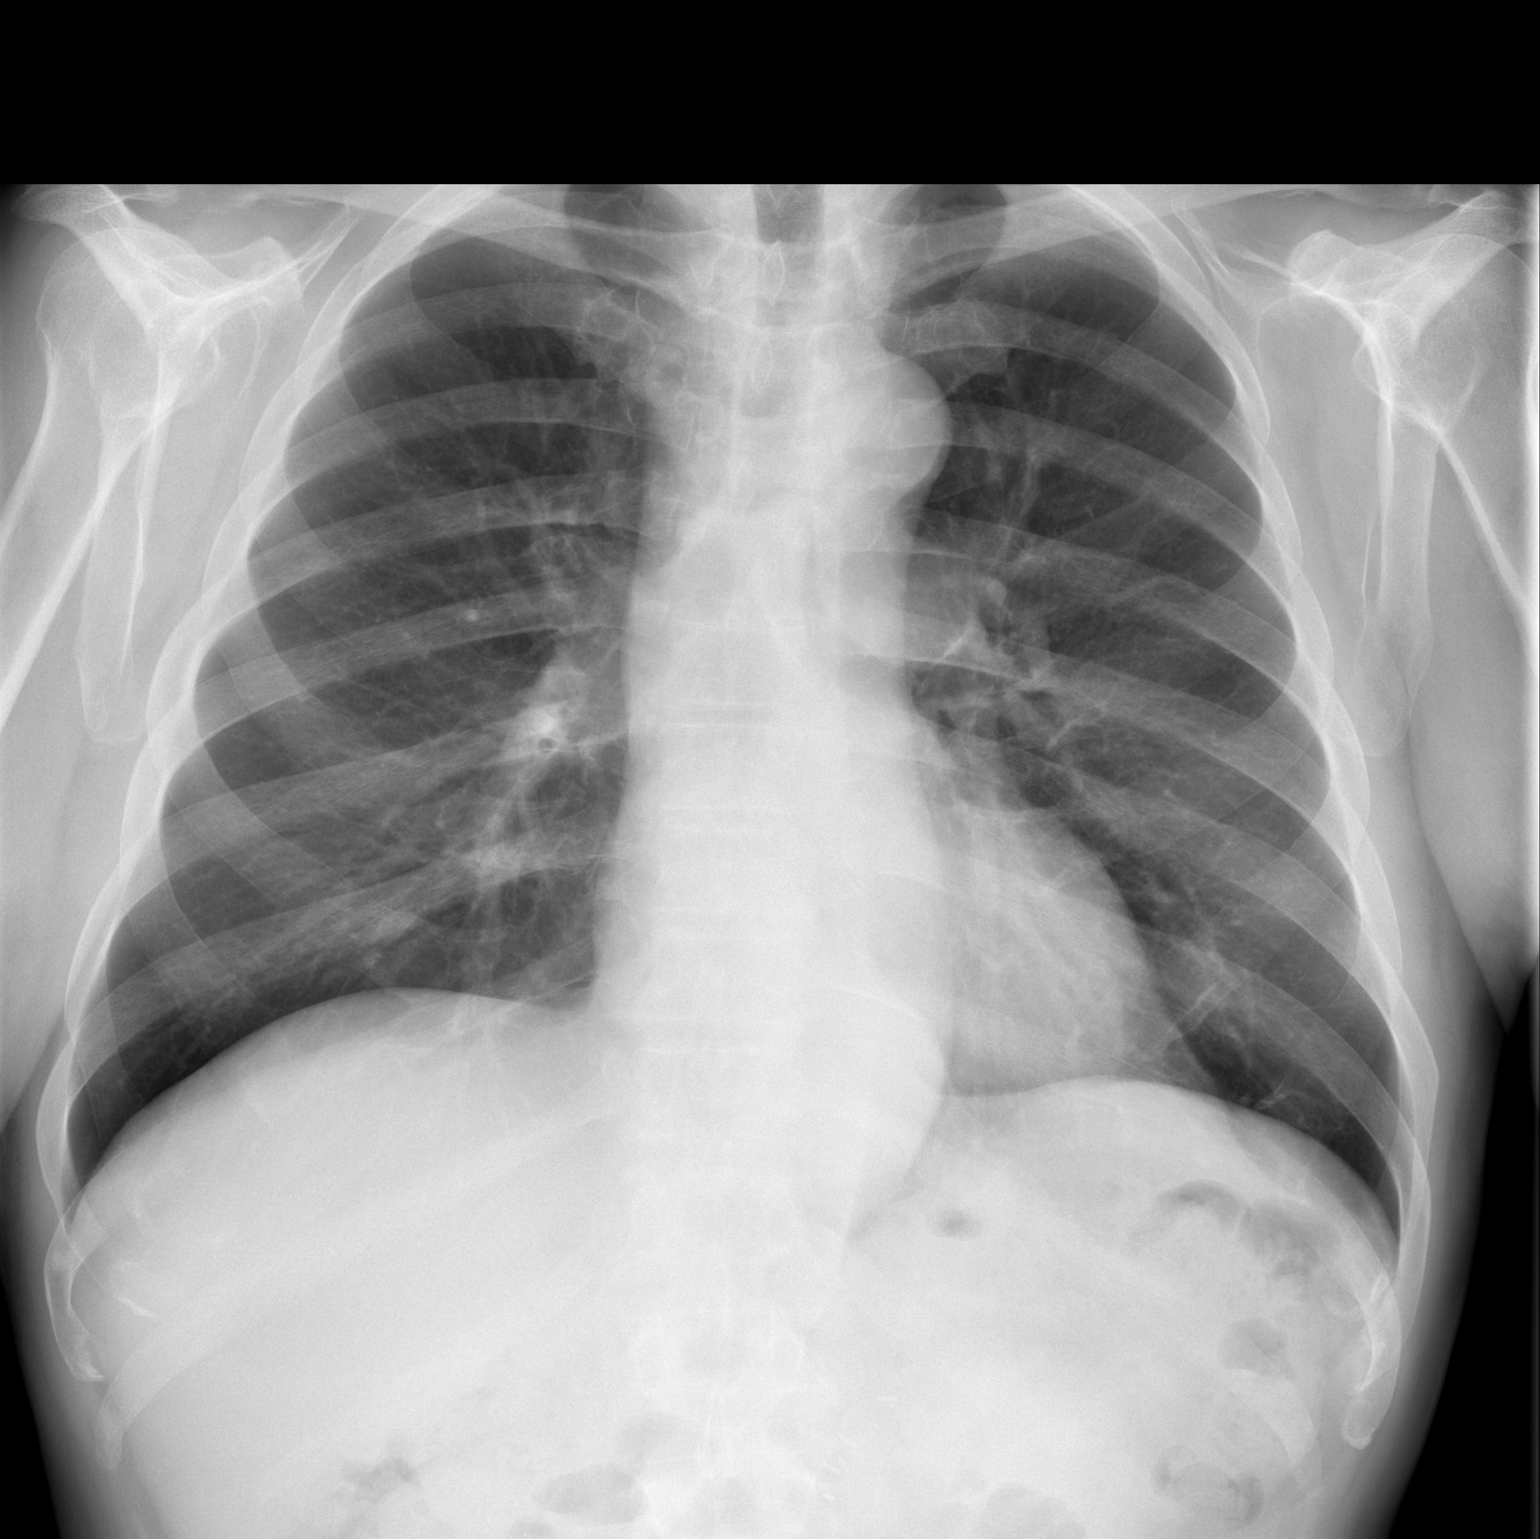

[w chest lat]
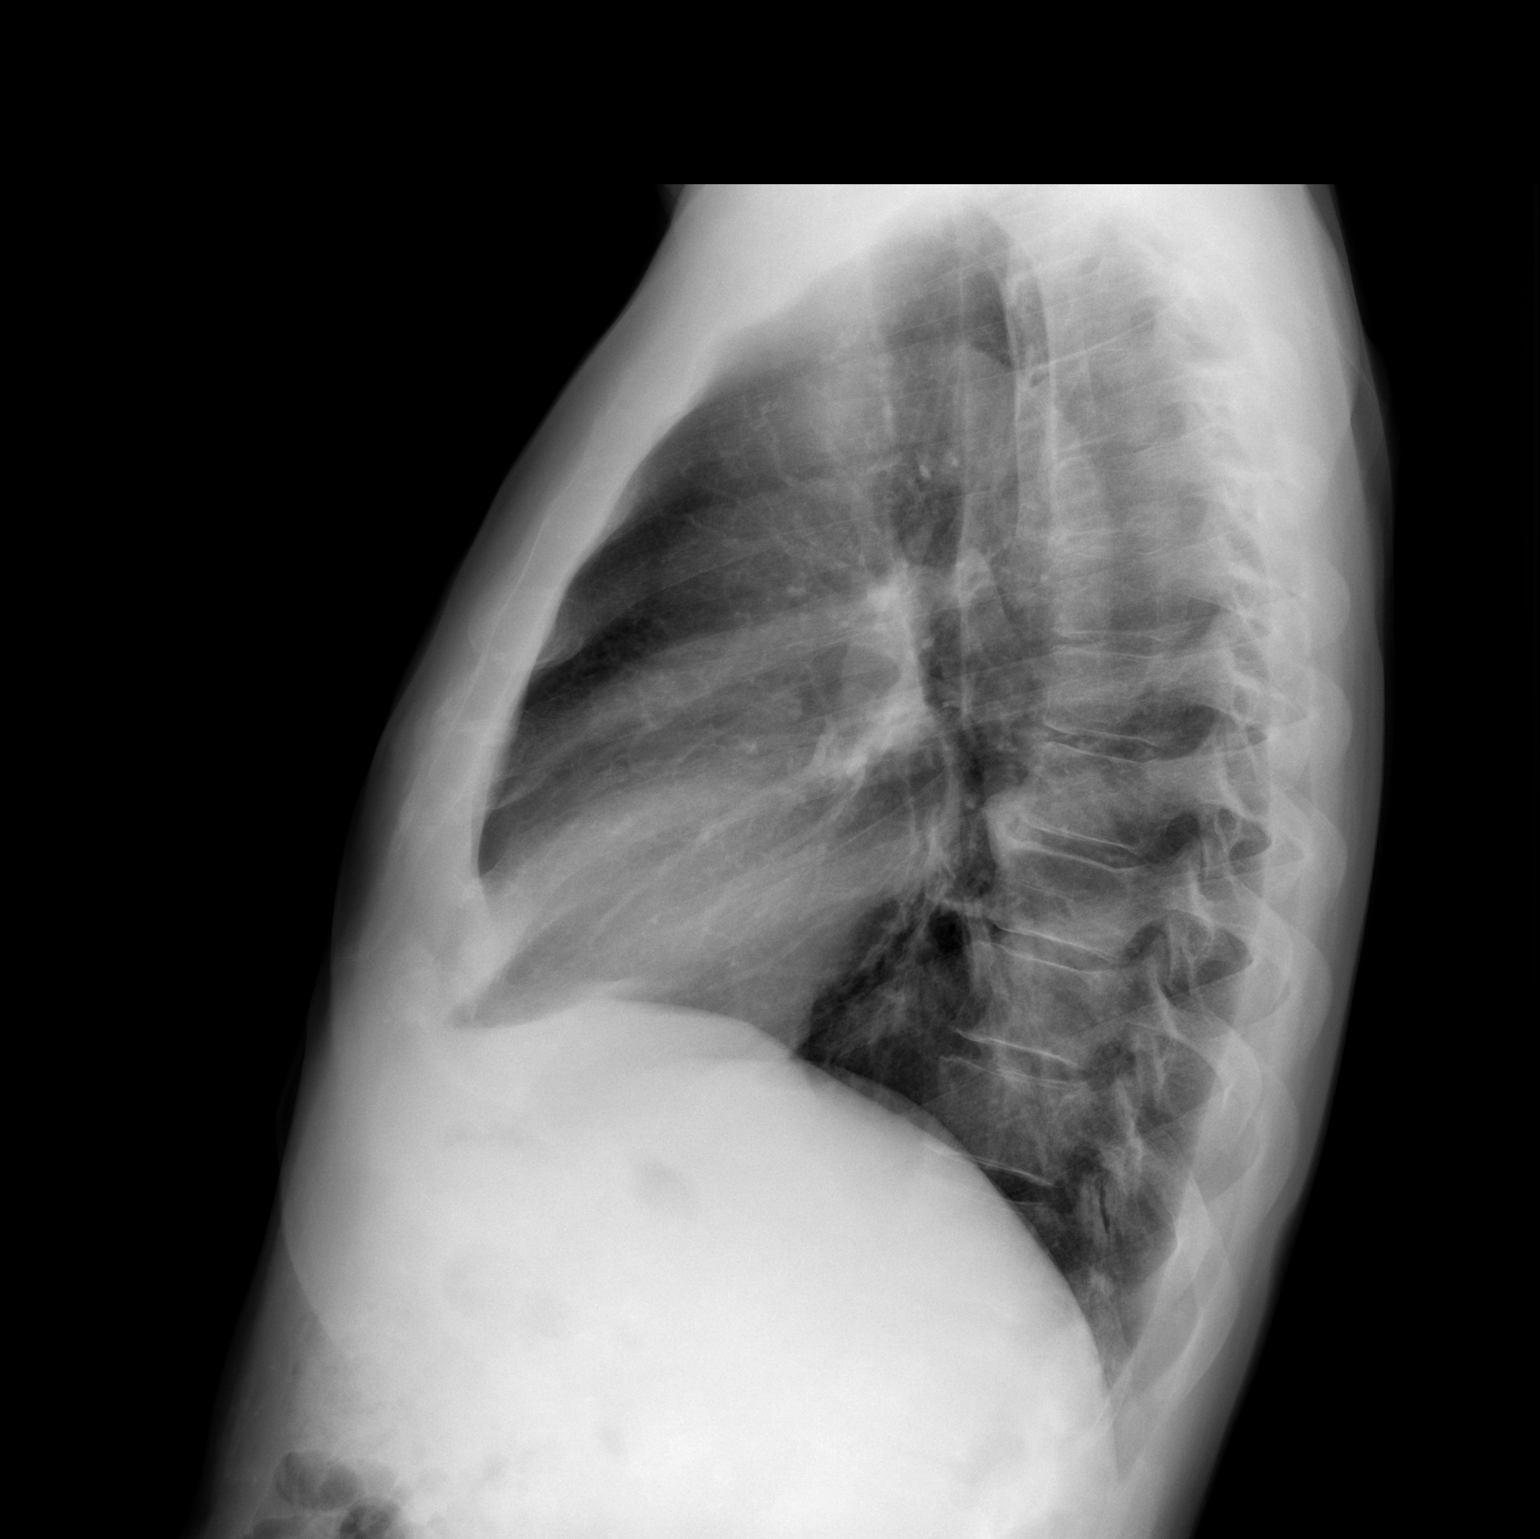

[2 of 2 positions shown; findings below may reference images not displayed]

FINDINGS: The lungs are clear bilaterally.  No confluent airspace
opacities, pleural effuions or pneumothoracies are seen.  The heart
is normal in size and contour.  The upper abdomen and osseous
structures are normal. A moderate sized hiatal hernia is again
seen.
IMPRESSION: No acute cardiopulmonary disease.

## 2010-07-22 ENCOUNTER — Ambulatory Visit: Payer: Self-pay | Admitting: Surgery

## 2010-07-22 ENCOUNTER — Encounter: Payer: Self-pay | Admitting: Emergency Medicine

## 2010-07-22 ENCOUNTER — Ambulatory Visit: Payer: Self-pay | Admitting: Pulmonary Disease

## 2010-07-22 ENCOUNTER — Inpatient Hospital Stay (HOSPITAL_COMMUNITY): Admission: EM | Admit: 2010-07-22 | Discharge: 2010-08-01 | Payer: Self-pay | Admitting: Emergency Medicine

## 2010-07-22 DIAGNOSIS — T827XXA Infection and inflammatory reaction due to other cardiac and vascular devices, implants and grafts, initial encounter: Secondary | ICD-10-CM

## 2010-07-22 HISTORY — PX: AXILLARY-FEMORAL BYPASS GRAFT: SHX894

## 2010-07-22 HISTORY — DX: Infection and inflammatory reaction due to other cardiac and vascular devices, implants and grafts, initial encounter: T82.7XXA

## 2010-07-22 IMAGING — CT CT ABD-PELV W/ CM
2 of 5 series · 17 of 46 positions shown, 19 images · IV contrast (agent unspecified)
Comparison: CT scan [DATE].

CLINICAL DATA: Abdominal pain, nausea, vomiting and constipation.
History of aortic aneurysm repair.

CT ABDOMEN AND PELVIS WITH CONTRAST
TECHNIQUE: Multidetector CT imaging of the abdomen and pelvis was
performed following the standard protocol during bolus
administration of intravenous contrast.
Contrast: 100 ml [0F].

[Series 2: rtn ap with st · axial · 0.79mm/px · z∈[-507,-87]mm · 14 of 96 slices shown, 16 images]
[im 6/96  soft-tissue]
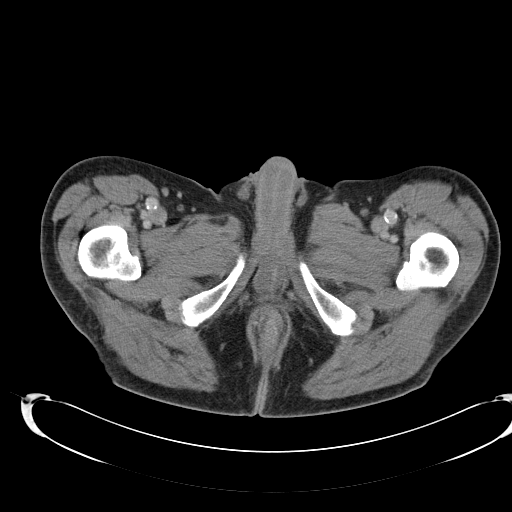
[im 6/96  bone]
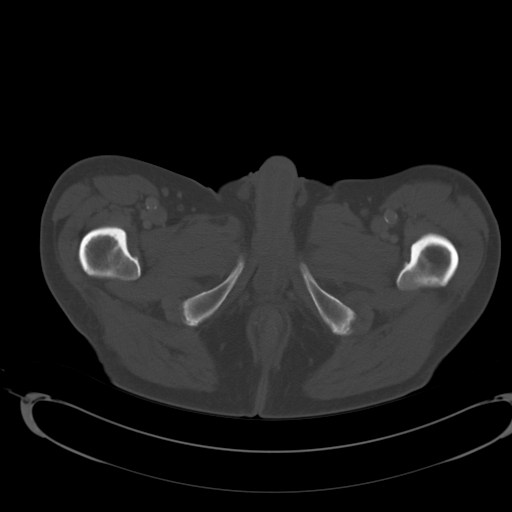
[im 11/96  soft-tissue]
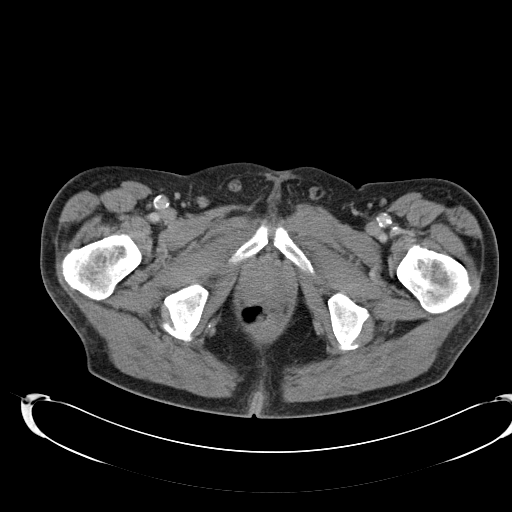
[im 22/96  soft-tissue]
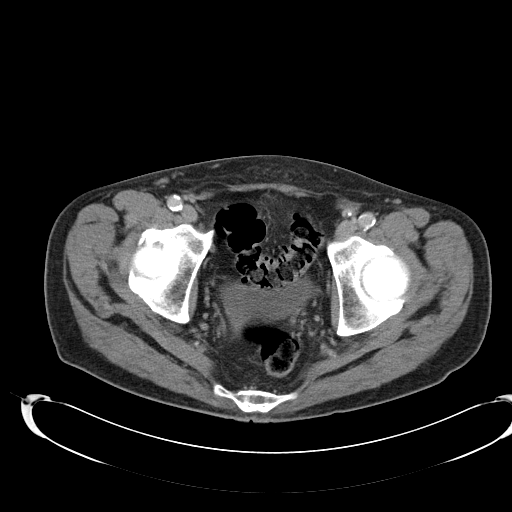
[im 27/96  soft-tissue]
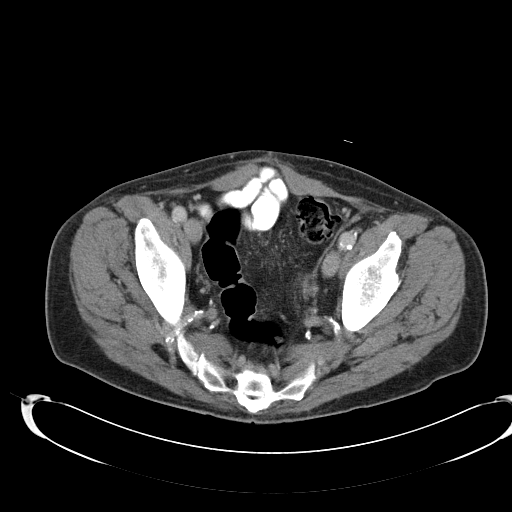
[im 32/96  soft-tissue]
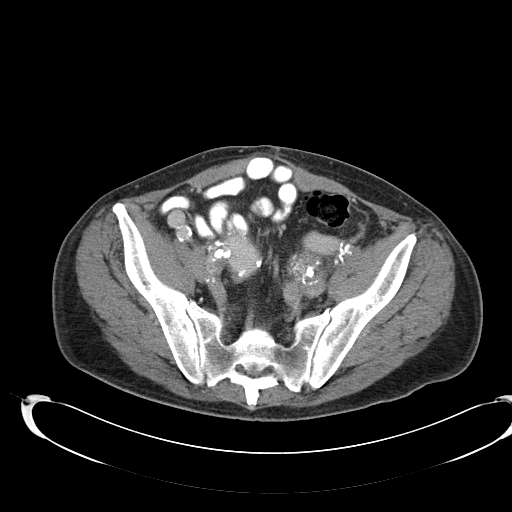
[im 37/96  soft-tissue]
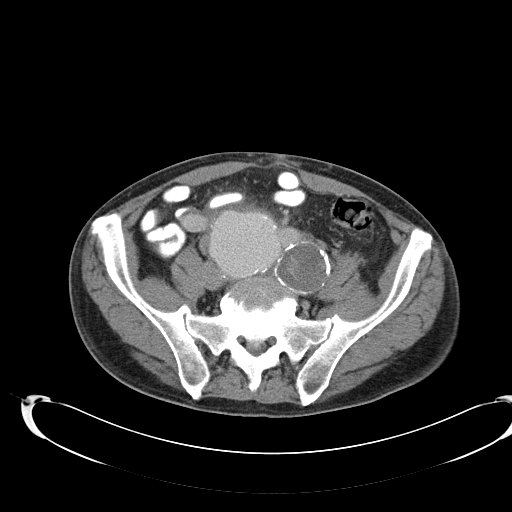
[im 43/96  soft-tissue]
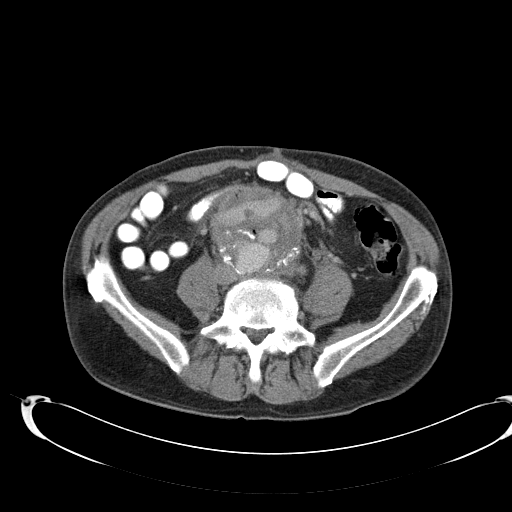
[im 53/96  soft-tissue]
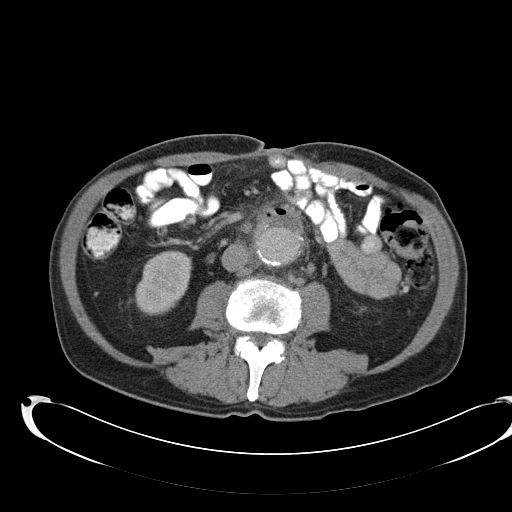
[im 59/96  soft-tissue]
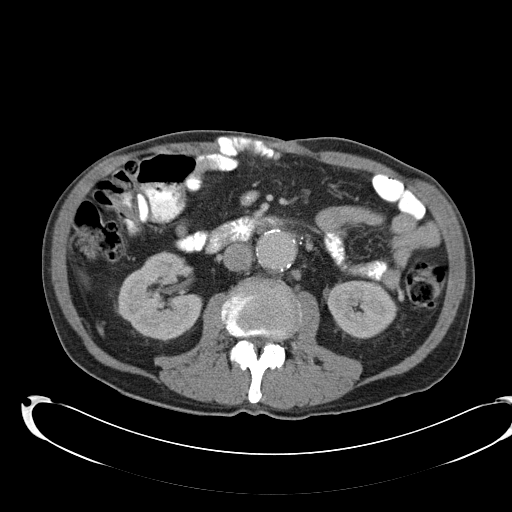
[im 59/96  bone]
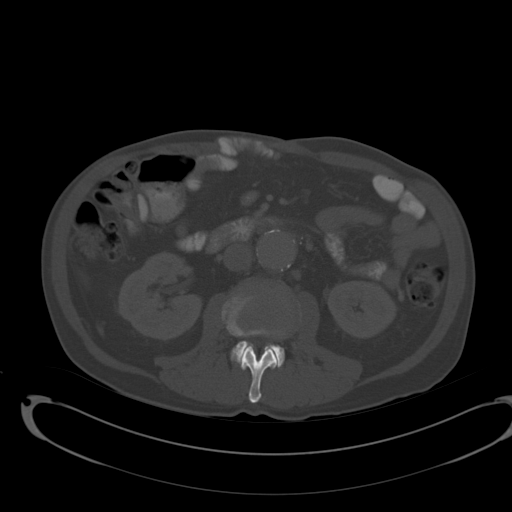
[im 64/96  soft-tissue]
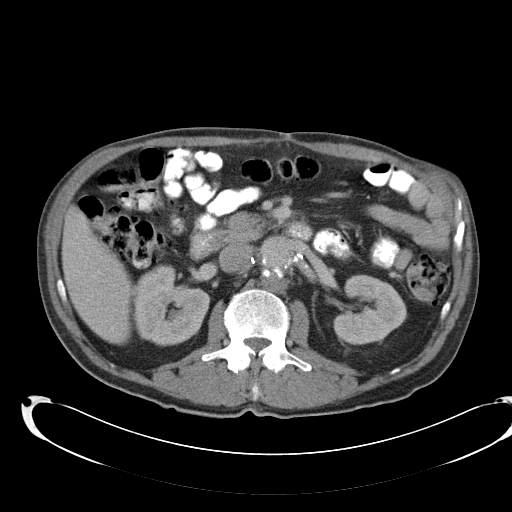
[im 69/96  soft-tissue]
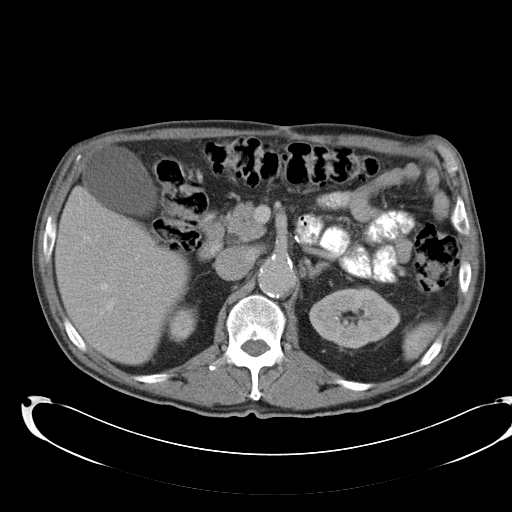
[im 74/96  soft-tissue]
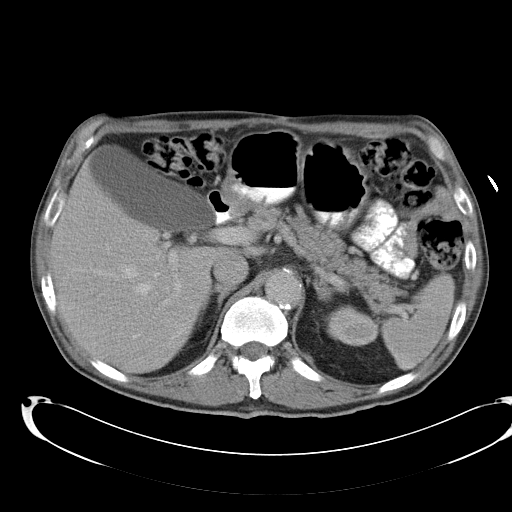
[im 85/96  soft-tissue]
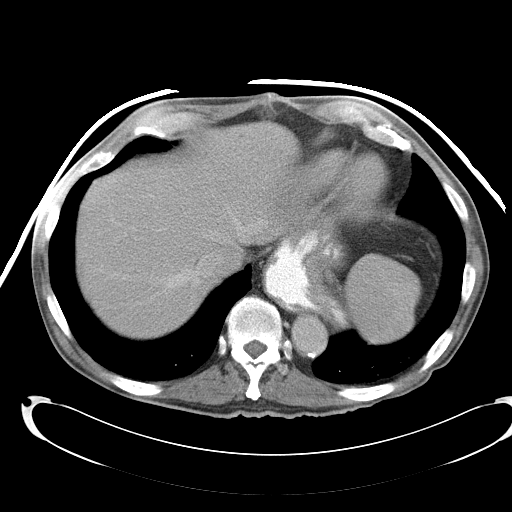
[im 90/96  soft-tissue]
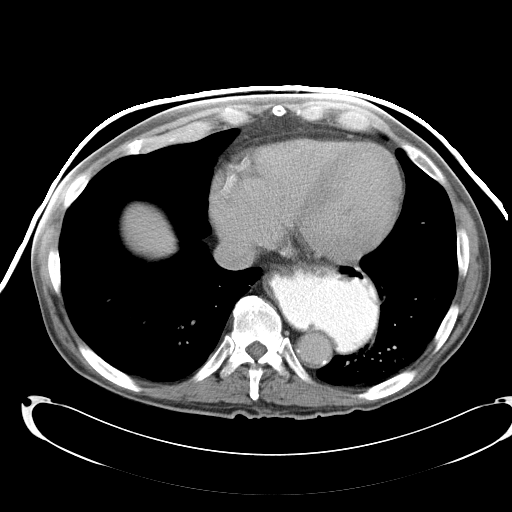

[Series 602: coronal images · coronal · 0.97mm/px · 3 of 85 slices shown]
[im 29/85  soft-tissue]
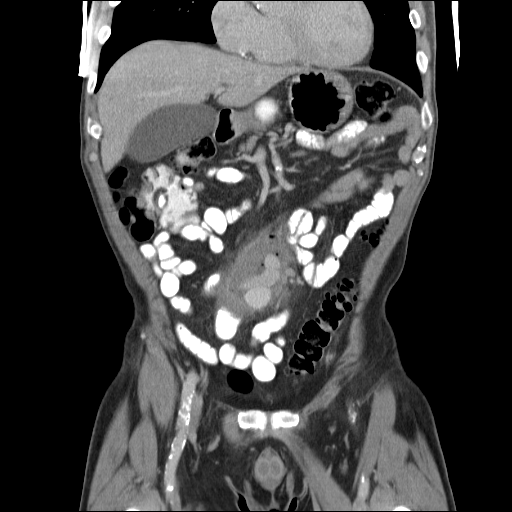
[im 38/85  soft-tissue]
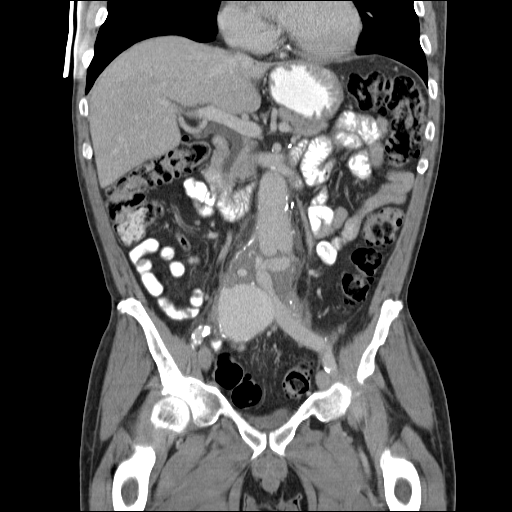
[im 47/85  soft-tissue]
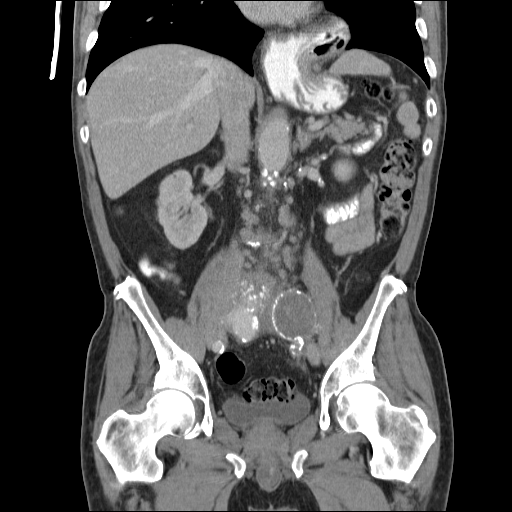

[17 of 46 positions shown; findings below may reference images not displayed]

FINDINGS: The lung bases are clear.  There is a large hiatal
hernia.

The solid abdominal organs are unremarkable except for small renal
calculi.

Mild distention the gallbladder is noted.  The stomach, duodenum,
small bowel and colon grossly normal.

There are surgical changes from the prior oversewn aorto-iliac
graft.  There is a large saccular aneurysm which is new extending
into the right common iliac artery aneurysm sac.  There is evidence
of acute contrast extravasation into the oversewn aortic graft but
no obvious extravasation of contrast into the retroperitoneal or
intraperitoneal cavity.  There are also gas collections in the
aneurysm sac suggesting infection.

The bladder, prostate gland and seminal vesicles are unremarkable.
No pelvic fluid collection or adenopathy.  There is retroperitoneal
adenopathy likely due to an infected aortic graft.
IMPRESSION: Findings consistent with an infected aortic graft with active
extravasation/leak into the oversewn aneurysm sac.  There is a 6 x
6 cm saccular aneurysm extending into the right iliac native
aneurysm.

Critical test results telephoned to Dr. BORK at the time of
interpretation on [DATE] at [0F] hours.

## 2010-07-23 ENCOUNTER — Encounter: Payer: Self-pay | Admitting: Surgery

## 2010-07-23 IMAGING — CR DG CHEST 1V PORT
1 series · 1 of 1 positions shown · non-contrast
Comparison: [DATE]

CLINICAL DATA: Postop AAA.

PORTABLE CHEST - 1 VIEW

[view not recorded]
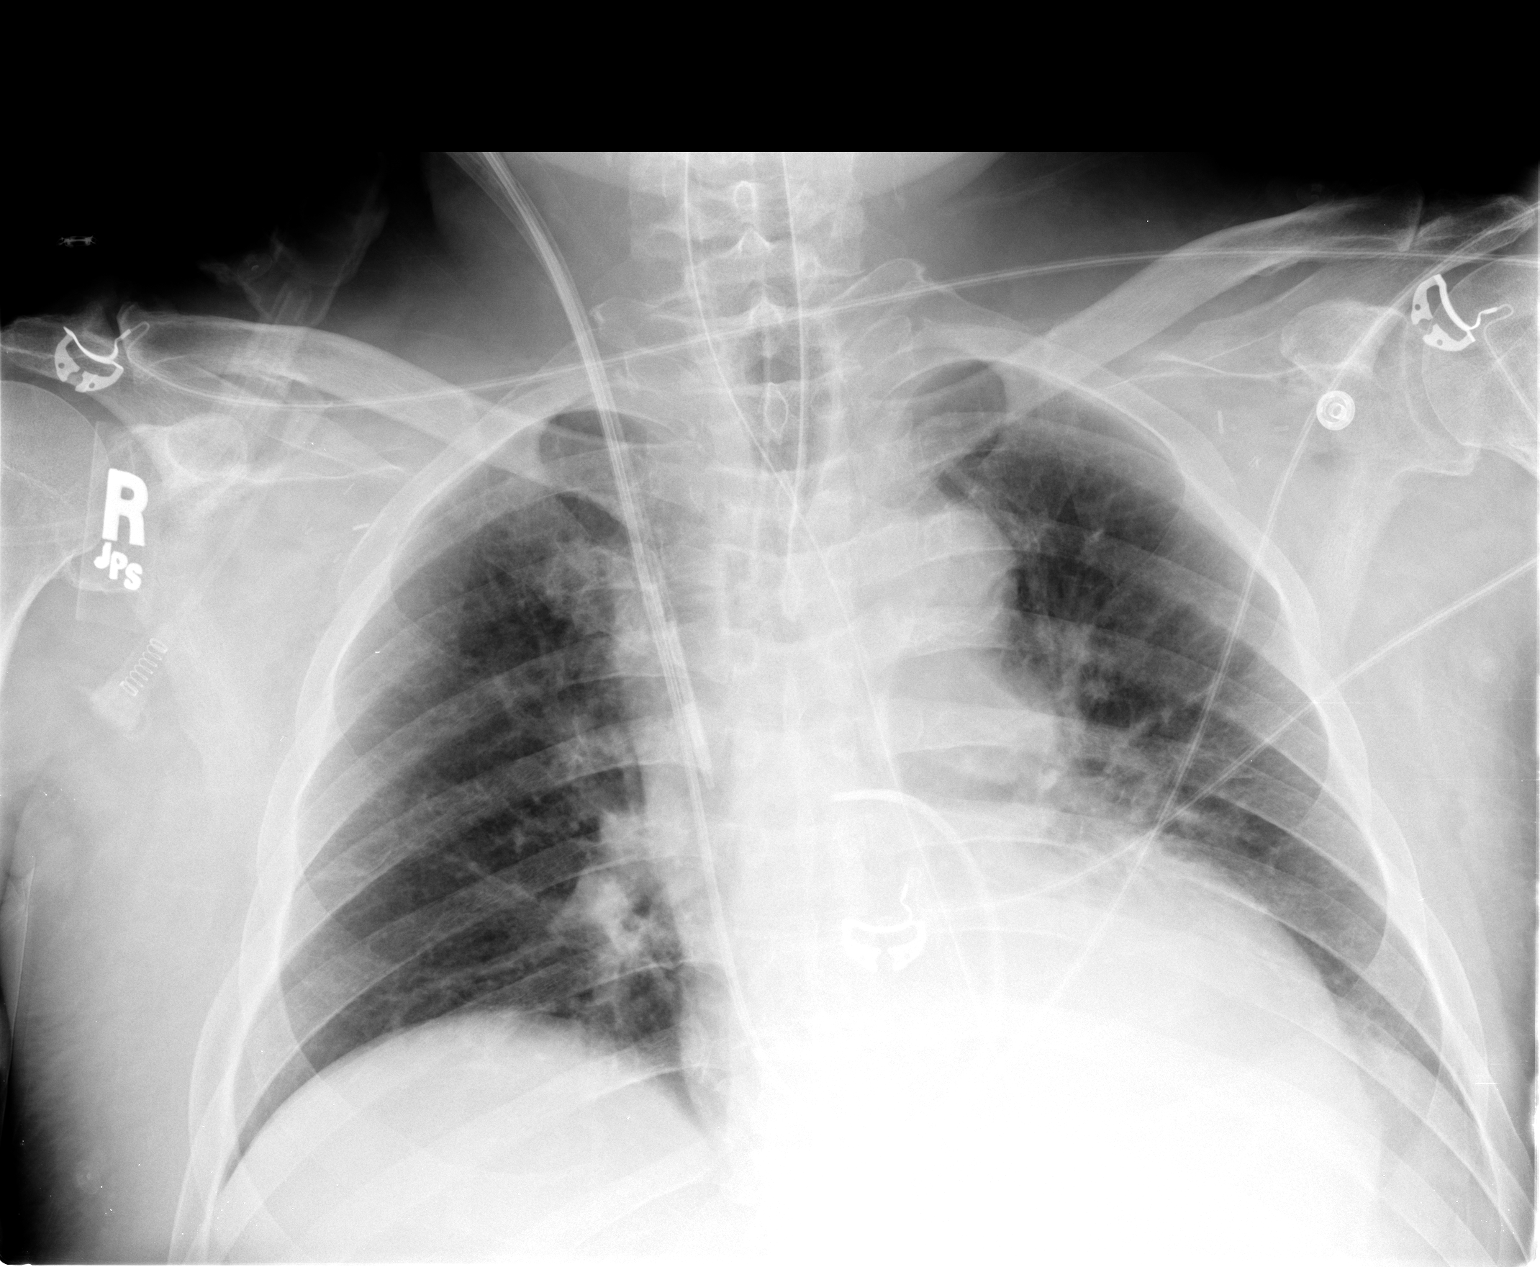

[1 of 1 positions shown; findings below may reference images not displayed]

FINDINGS: Endotracheal tube terminates approximately 4 cm above the
carina.  Right IJ central line tip projects over the SVC.  Right IJ
Swan-Ganz catheter tip projects over the proximal right pulmonary
artery.  Nasogastric tube courses through a large hiatal hernia in
the lower left chest, and is followed into the left upper quadrant,
with the tip projecting beyond the inferior boundary of the film.

Heart size is accentuated by low lung volumes and AP supine
technique.  Lungs are low in volume with mild central pulmonary
congestion.  Bibasilar atelectasis, left greater than right.  No
pneumothorax.
IMPRESSION: Low lung volumes with bibasilar atelectasis, left greater than
right.  Mild central pulmonary vascular congestion.

## 2010-07-24 IMAGING — CR DG CHEST 1V PORT
1 series · 1 of 1 positions shown · non-contrast
Comparison: Portable chest x-ray of [DATE]

CLINICAL DATA: On ventilator, history of triple lead

PORTABLE CHEST - 1 VIEW

[AP]
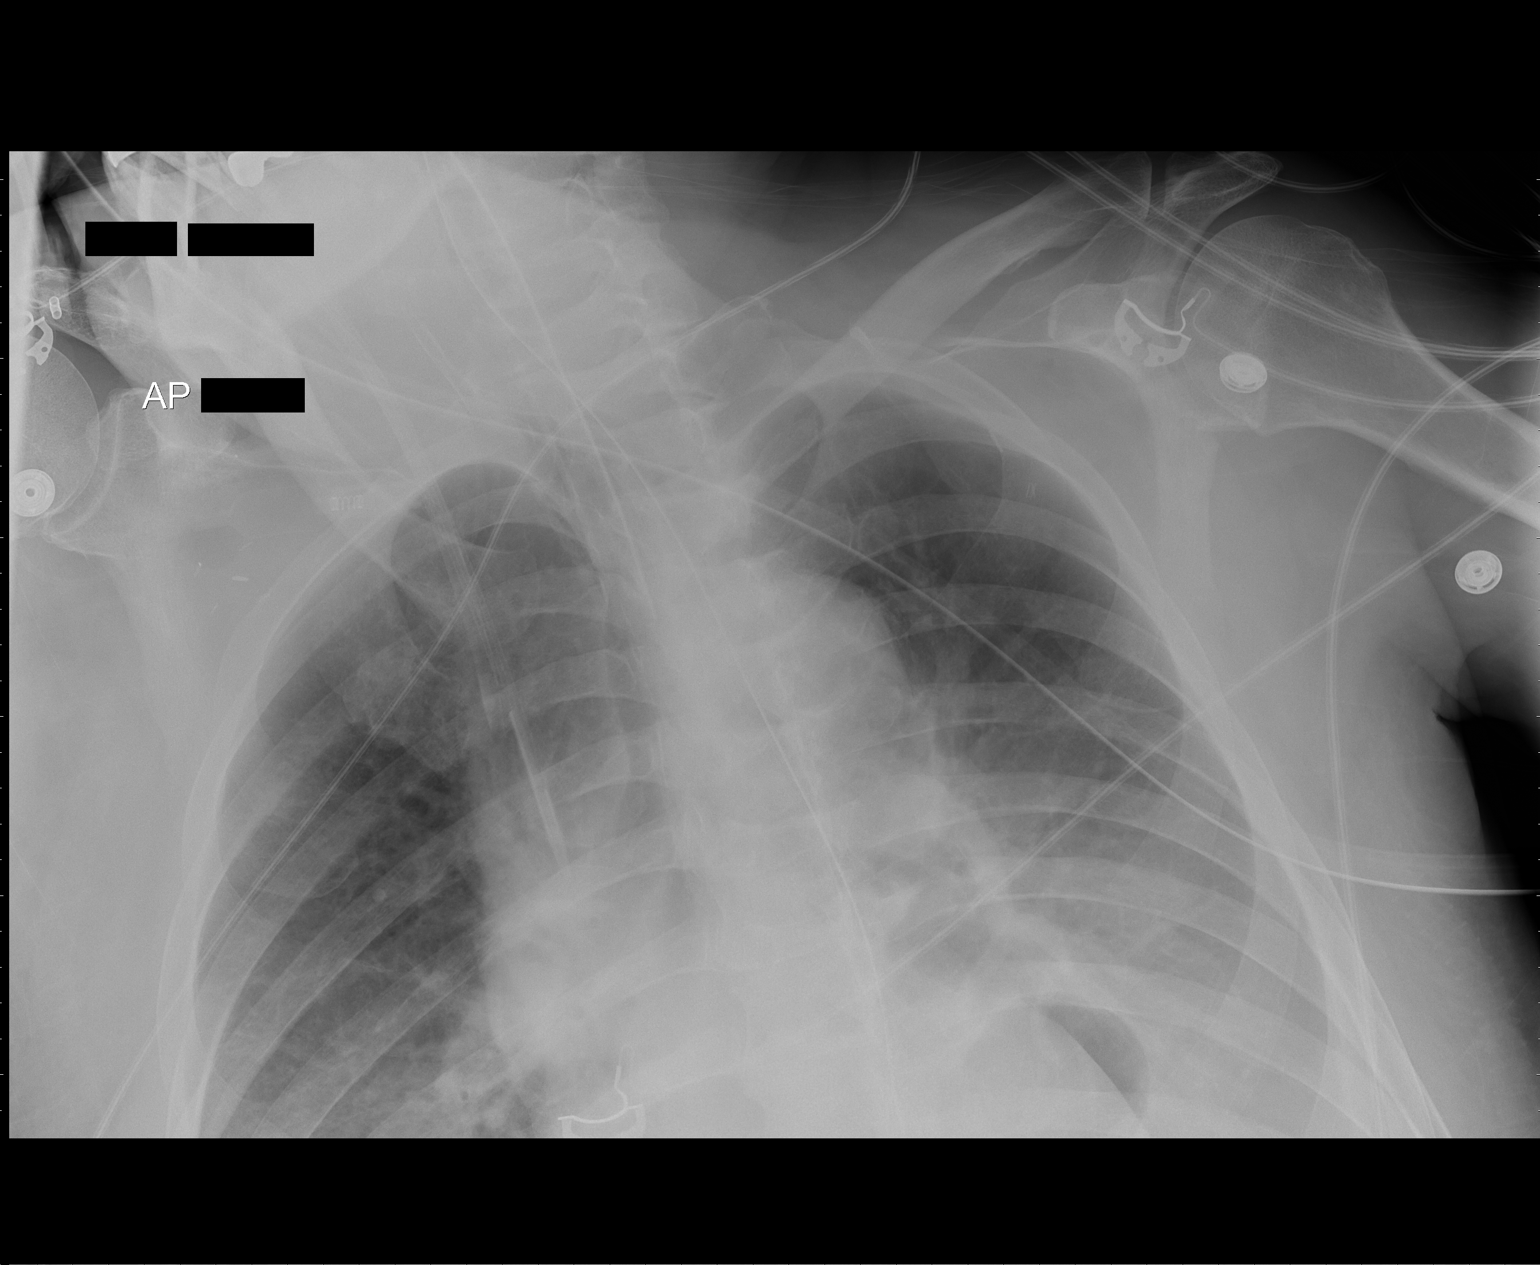

[1 of 1 positions shown; findings below may reference images not displayed]

FINDINGS: Endotracheal tube and right central venous line are
unchanged in position.  There has been some increase in basilar
opacity left greater than right most consistent with atelectasis
and probable effusions.  Lucency at the left lung base may
represent air within a hiatal hernia or medial pneumothorax and
follow-up is recommended. Cardiomegaly is stable.
IMPRESSION: Increasing airspace disease at the bases left greater than right
most consistent with atelectasis and effusion.  Air of the medial
left lung base may be due to hiatal hernia or less likely medial
pneumothorax.  Recommend follow-up.

## 2010-07-27 IMAGING — CR DG CHEST 1V PORT
1 series · 1 of 1 positions shown · non-contrast
Comparison: [DATE]

CLINICAL DATA: History of abdominal aortic aneurysm.

PORTABLE CHEST - 1 VIEW

[AP]
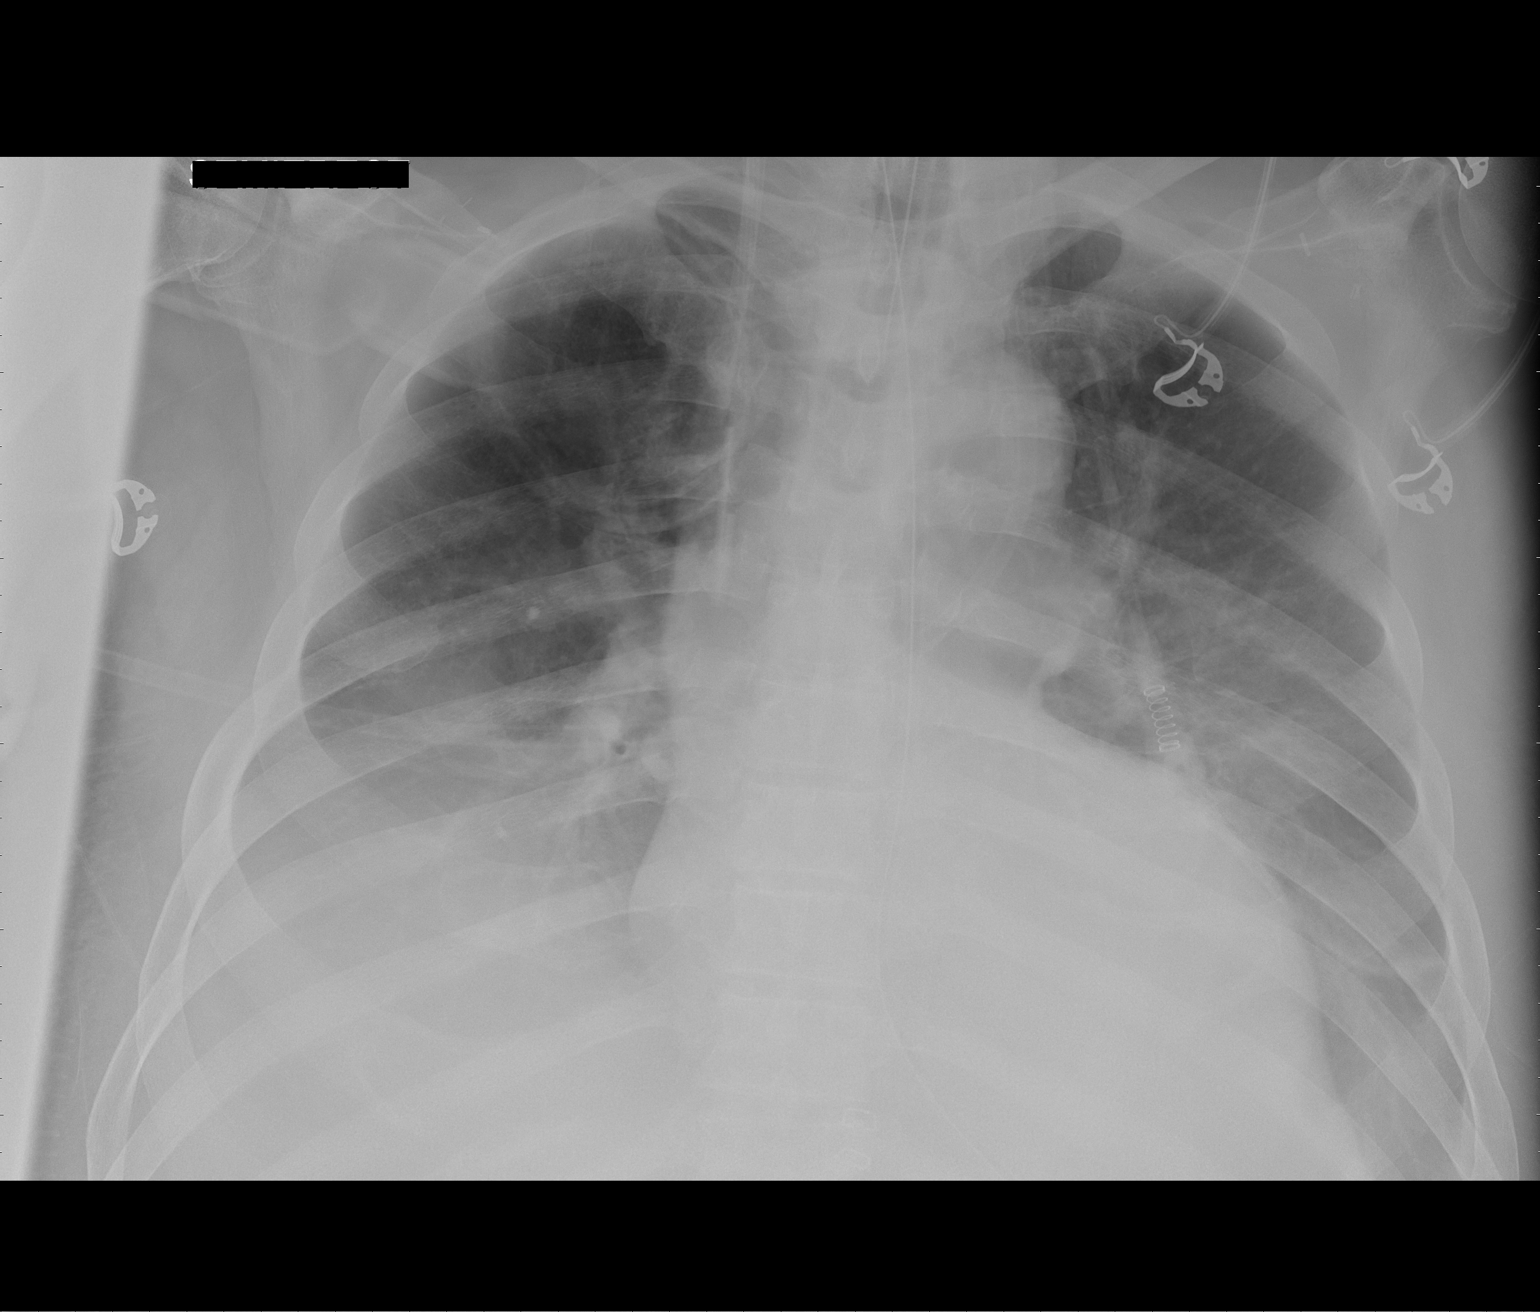

[1 of 1 positions shown; findings below may reference images not displayed]

FINDINGS: Endotracheal tube is 5.9 cm of above the carina.
Nasogastric tube extends into the abdomen.  There is a right
jugular central venous catheter in the SVC.  No evidence for a
large pneumothorax.  Bibasilar densities suggestive for
consolidation and pleural fluid.  Heart size is within normal
limits.
IMPRESSION: Basilar lung opacifications suggestive for pleural fluid and likely
consolidation.

## 2010-07-28 IMAGING — CR DG CHEST 1V PORT
1 series · 1 of 1 positions shown · non-contrast
Comparison: [DATE]

CLINICAL DATA: Abdominal aortic aneurysm.

PORTABLE CHEST - 1 VIEW

[AP]
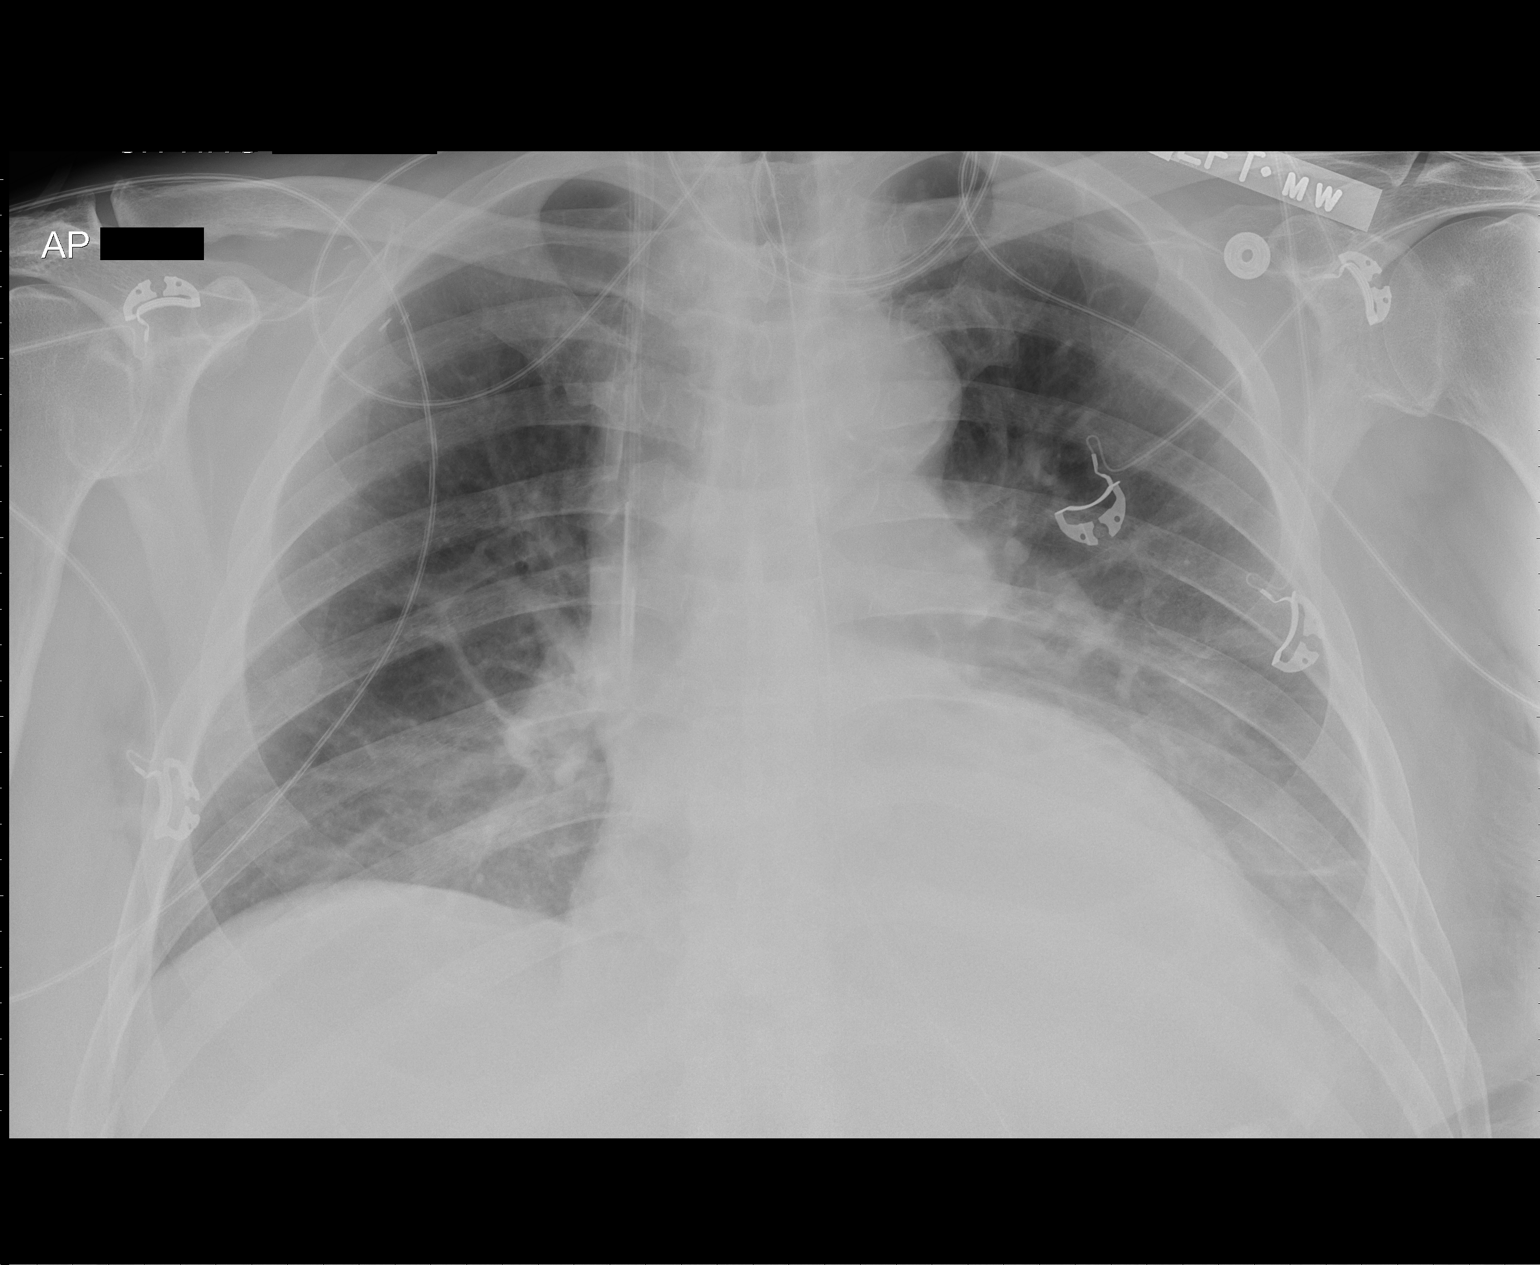

[1 of 1 positions shown; findings below may reference images not displayed]

FINDINGS: The patient has been extubated.  The NG tube remains.  A
right IJ double lumen catheter is stable in position.  Bibasilar
airspace disease, likely reflecting atelectasis remains, now worse
on the left.  There is some clearing at the right base.  A left
pleural effusion is suspected.  No other focal airspace disease is
evident.  The visualized soft tissues and bony thorax are
unremarkable.
IMPRESSION: 1.  Clearing at the right base.
2.  Persistent left basilar airspace disease and probable
effusion/atelectasis.

## 2010-07-31 ENCOUNTER — Ambulatory Visit: Payer: Self-pay | Admitting: Infectious Diseases

## 2010-08-18 ENCOUNTER — Ambulatory Visit: Payer: Self-pay | Admitting: Surgery

## 2010-08-19 ENCOUNTER — Encounter: Payer: Self-pay | Admitting: Infectious Disease

## 2010-08-20 ENCOUNTER — Ambulatory Visit: Payer: Self-pay | Admitting: Infectious Disease

## 2010-08-20 DIAGNOSIS — T8579XA Infection and inflammatory reaction due to other internal prosthetic devices, implants and grafts, initial encounter: Secondary | ICD-10-CM | POA: Insufficient documentation

## 2010-08-20 DIAGNOSIS — I714 Abdominal aortic aneurysm, without rupture, unspecified: Secondary | ICD-10-CM | POA: Insufficient documentation

## 2010-08-20 DIAGNOSIS — A4151 Sepsis due to Escherichia coli [E. coli]: Secondary | ICD-10-CM | POA: Insufficient documentation

## 2010-08-20 DIAGNOSIS — I330 Acute and subacute infective endocarditis: Secondary | ICD-10-CM | POA: Insufficient documentation

## 2010-08-20 LAB — CONVERTED CEMR LAB
Chloride: 106 meq/L (ref 96–112)
Creatinine, Ser: 1.02 mg/dL (ref 0.40–1.50)
Hemoglobin: 11.4 g/dL — ABNORMAL LOW (ref 13.0–17.0)
Lymphocytes Relative: 13 % (ref 12–46)
Lymphs Abs: 0.5 10*3/uL — ABNORMAL LOW (ref 0.7–4.0)
MCHC: 31.1 g/dL (ref 30.0–36.0)
Monocytes Absolute: 0.3 10*3/uL (ref 0.1–1.0)
Monocytes Relative: 7 % (ref 3–12)
Neutro Abs: 3.1 10*3/uL (ref 1.7–7.7)
Neutrophils Relative %: 78 % — ABNORMAL HIGH (ref 43–77)
RBC: 3.68 M/uL — ABNORMAL LOW (ref 4.22–5.81)
WBC: 4 10*3/uL (ref 4.0–10.5)

## 2010-09-09 ENCOUNTER — Encounter
Admission: RE | Admit: 2010-09-09 | Discharge: 2010-09-09 | Payer: Self-pay | Source: Home / Self Care | Attending: Surgery | Admitting: Surgery

## 2010-09-09 IMAGING — CT CT ANGIO ABDOMEN
2 of 7 series · 10 of 36 positions shown, 17 images · IV contrast ([ID] [id number])
Comparison: [DATE]

CTA CHEST

CLINICAL DATA: Previous infected aortic bypass graft, status post
recent bilateral axillary to femoral bypass

CT ANGIOGRAPHY CHEST, ABDOMEN AND PELVIS
TECHNIQUE: Multidetector CT imaging through the chest, abdomen and
pelvis was performed using the standard protocol during bolus
administration of intravenous contrast.  Multiplanar reconstructed
images including MIPs were obtained and reviewed to evaluate the
vascular anatomy.
Contrast: 100 ml [EW]

[Series 5: angio · axial · 0.78mm/px · z∈[-592,-57]mm · 9 of 268 slices shown, 15 images]
[im 27/268  soft-tissue]
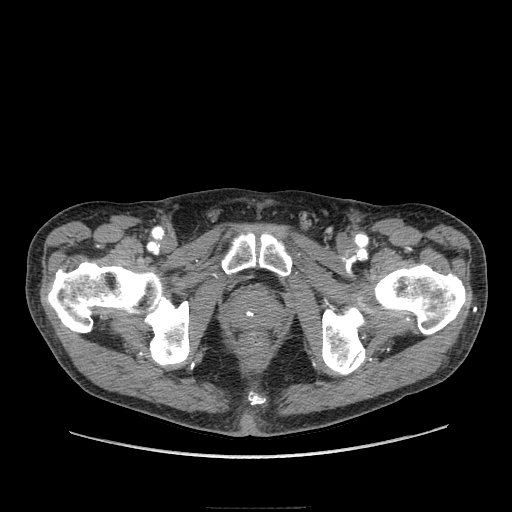
[im 27/268  bone]
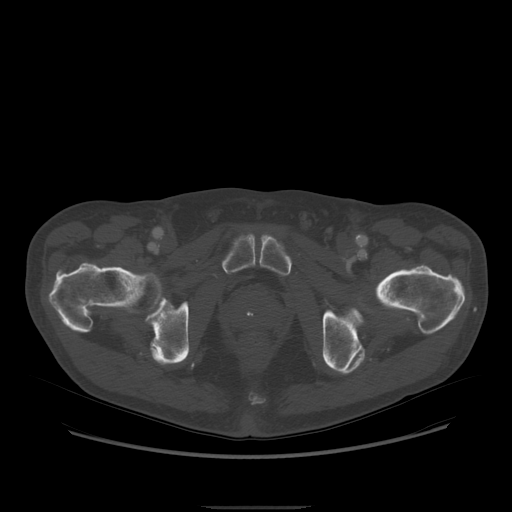
[im 54/268  soft-tissue]
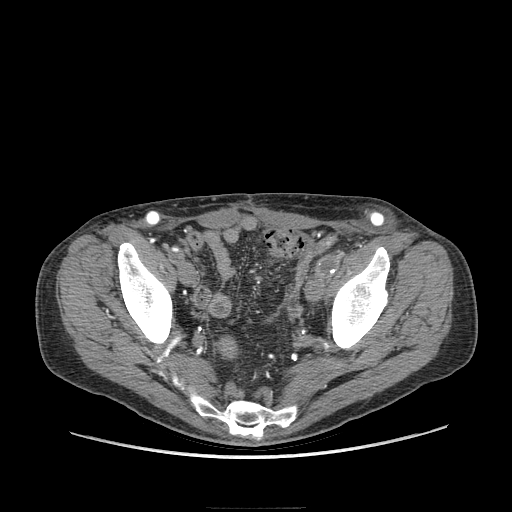
[im 81/268  soft-tissue]
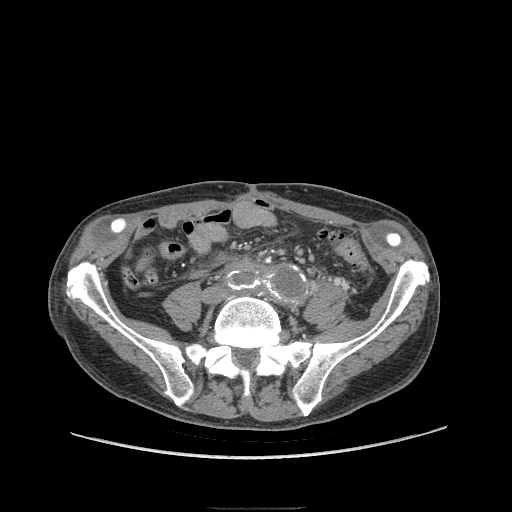
[im 107/268  soft-tissue]
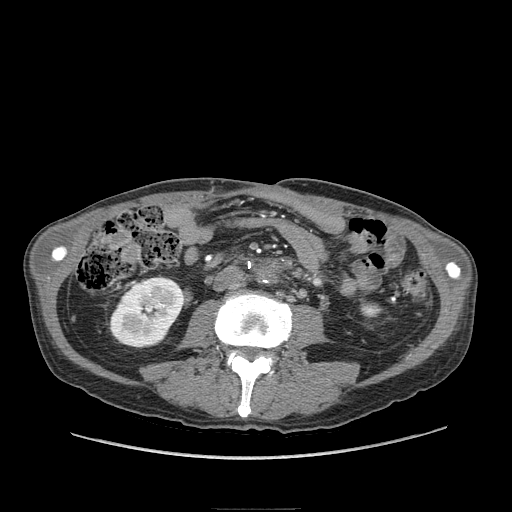
[im 134/268  soft-tissue]
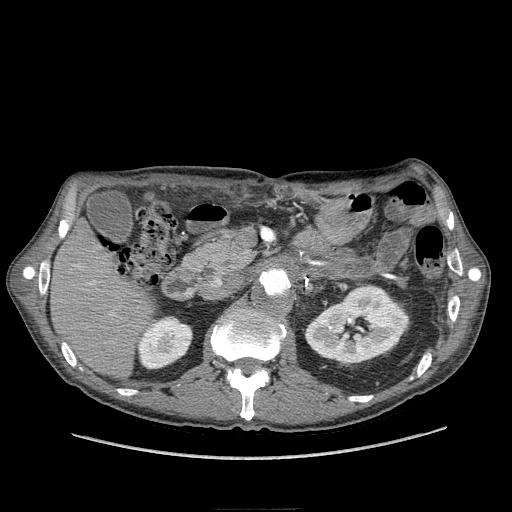
[im 161/268  soft-tissue]
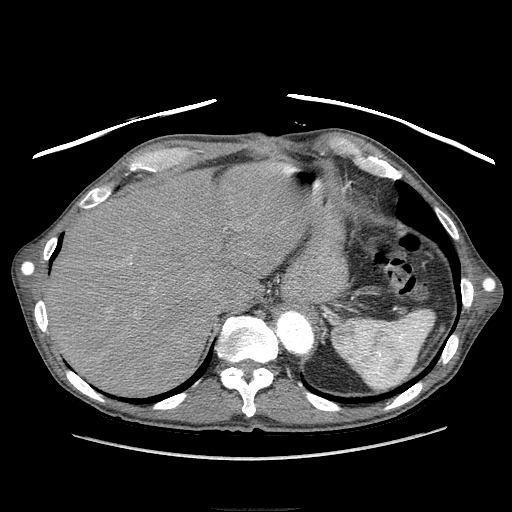
[im 161/268  lung]
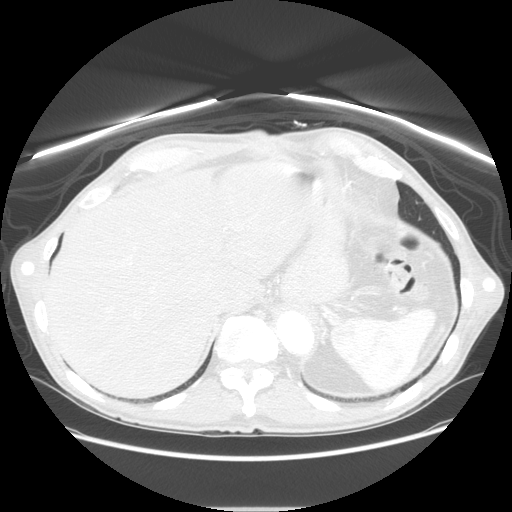
[im 187/268  soft-tissue]
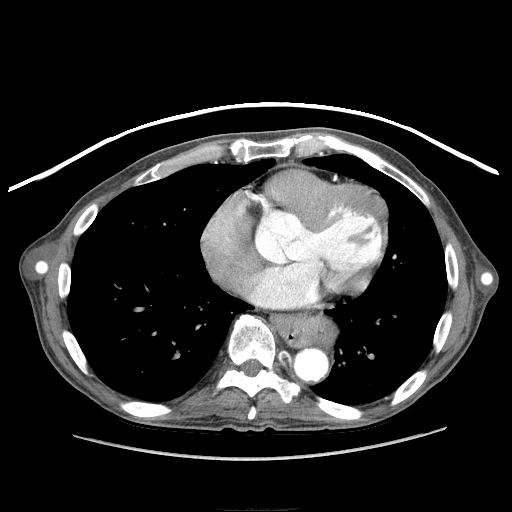
[im 187/268  lung]
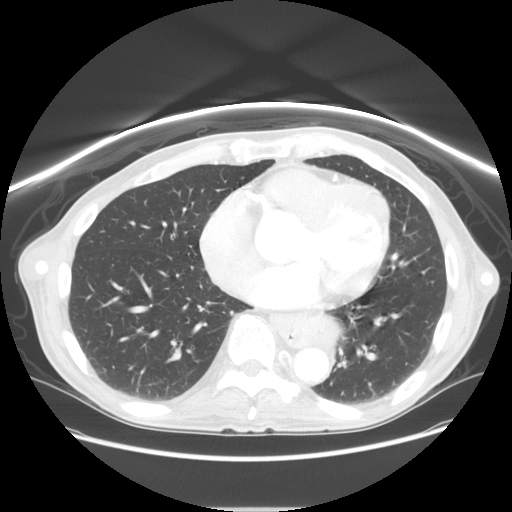
[im 214/268  soft-tissue]
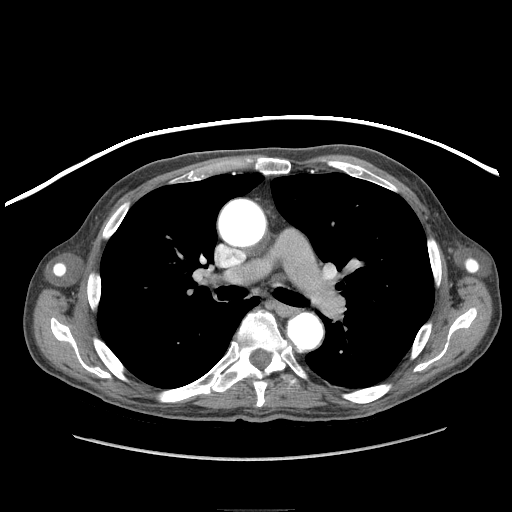
[im 214/268  lung]
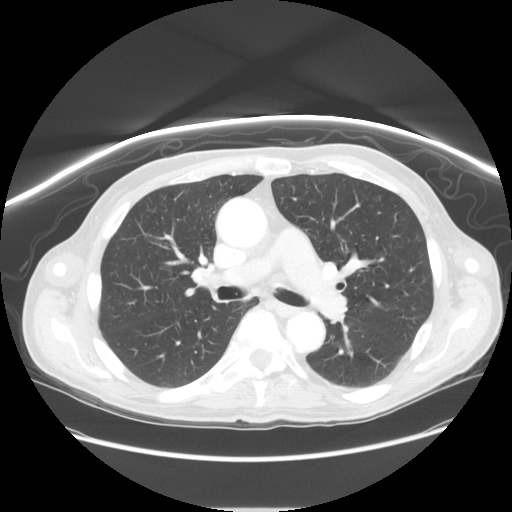
[im 241/268  soft-tissue]
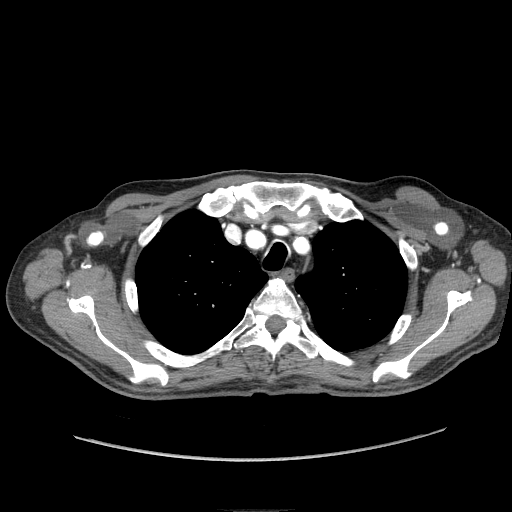
[im 241/268  lung]
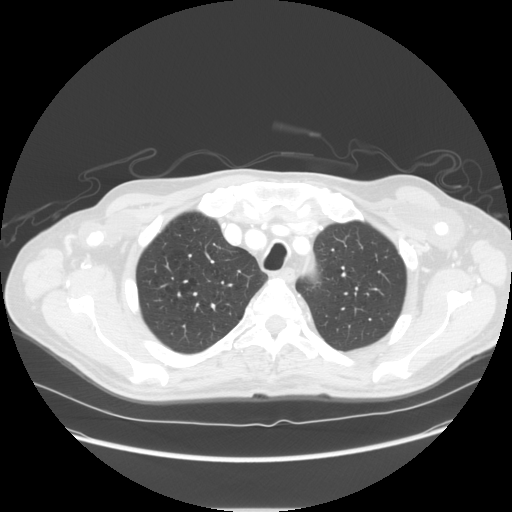
[im 241/268  bone]
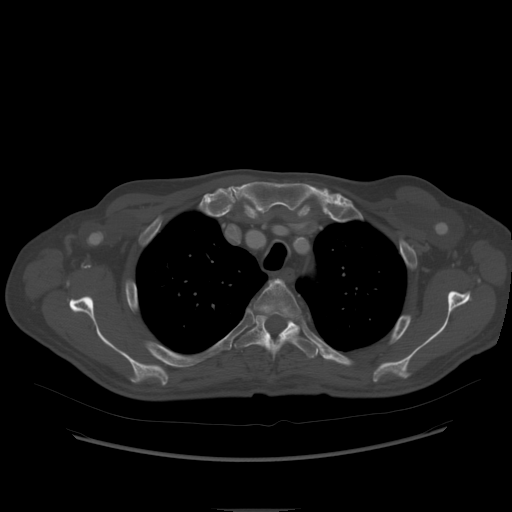

[Series 601: coronal body · coronal · 1.31mm/px · 1 of 119 slices shown, 2 images]
[im 40/119  soft-tissue]
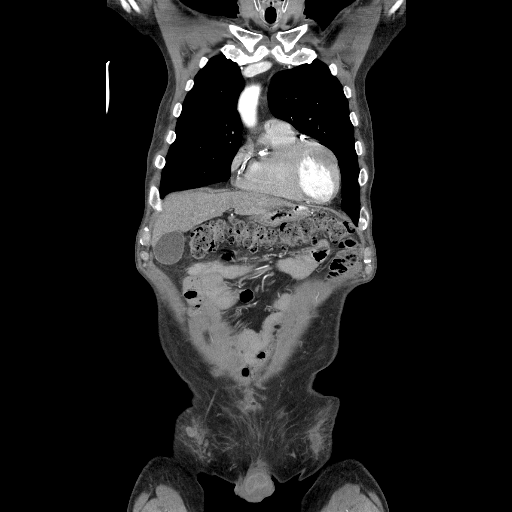
[im 40/119  bone]
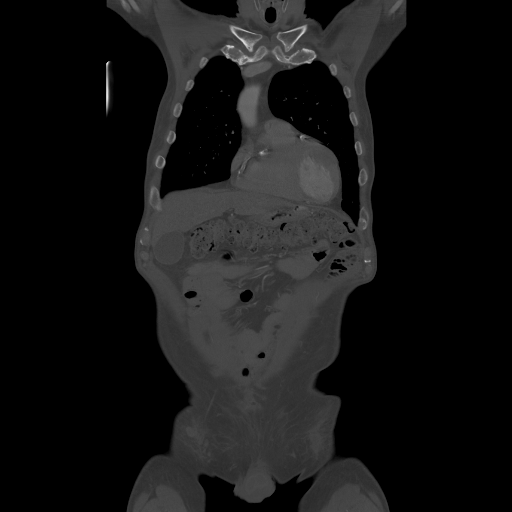

[10 of 36 positions shown; findings below may reference images not displayed]

FINDINGS: Patent bilateral axillary to femoral bypass grafts.
Elongated perigraft fluid collections noted along the bypass
conduits throughout their entire course.  Suspect postoperative
blood products and or seroma.  No air within these fluid
collections or peripheral enhancement to suggest definite
infection.

Atherosclerosis of the thoracic aorta and major branch vessels.
Branch vessels remain patent.  Coronary calcifications noted.
Normal heart size.  No pericardial or pleural effusion.  Large
hiatal hernia noted.

Lung windows  demonstrate mild hyperinflation related to emphysema.
Minimal left lower lobe atelectasis / scarring.  Central airways
trachea patent.

 Review of the MIP images confirms the above findings.
IMPRESSION: Patent bilateral axillary to femoral bypass grafts.

Elongated bilateral perigraft fluid collections suspicious for
postoperative blood products/seroma.  Perigraft infection less
likely.

CTA ABDOMEN
FINDINGS: Upper abdominal suprarenal aorta remains patent.  Aorta
is occluded below the renal arteries.  Postop changes noted.  The
celiac, SMA, and single renal arteries remain patent.  No residual
enhancement of the infrarenal aorta and iliac vessels.

Nonvascular imaging:  Liver, gallbladder, biliary system, pancreas,
spleen, and adrenal glands are within normal limits and stable for
arterial phase imaging.  Kidneys demonstrate symmetric enhancement.
No hydronephrosis.  Diffuse diverticulosis of the colon.  No bowel
obstruction pattern or ileus.  No abdominal free fluid, fluid
collection or acute hemorrhage.

 Review of the MIP images confirms the above findings.
IMPRESSION: Patent bilateral axillary femoral bypass grafts with perigraft
fluid collections as above.

Occluded infrarenal aorta and iliac vessels with postoperative
changes throughout the retroperitoneum.

Colonic diverticulosis

CTA PELVIS
FINDINGS: Distal femoral anastomoses are patent bilaterally.
Residual small perigraft fluid collections in the inguinal regions,
suspect blood products or seroma.  Common femoral, proximal SFA and
profunda femoral arteries are all patent.  Native iliac vessels are
occluded.  Postop changes along the iliac vasculature.  Distal
internal iliac branches reconstituted deep in the pelvis.  No
pelvic fluid collection or acute hemorrhage.  No free fluid.
Diverticulosis of the distal colon.

 Review of the MIP images confirms the above findings.
IMPRESSION: Occluded infrarenal aorta and pelvic iliac vessels with diffuse
postoperative findings in the retroperitoneum.

Patent axillary to femoral distal bypass anastomoses in the
inguinal regions.

Colonic diverticulosis

## 2010-10-06 ENCOUNTER — Ambulatory Visit
Admission: RE | Admit: 2010-10-06 | Discharge: 2010-10-06 | Payer: Self-pay | Source: Home / Self Care | Attending: Surgery | Admitting: Surgery

## 2010-10-06 ENCOUNTER — Ambulatory Visit: Admit: 2010-10-06 | Payer: Self-pay | Admitting: Surgery

## 2010-10-10 ENCOUNTER — Encounter: Payer: Self-pay | Admitting: Infectious Disease

## 2010-10-28 NOTE — Miscellaneous (Signed)
Summary: HIPAA Restrictions  HIPAA Restrictions   Imported By: Bonner Puna 08/26/2010 09:07:03  _____________________________________________________________________  External Attachment:    Type:   Image     Comment:   External Document

## 2010-10-28 NOTE — Assessment & Plan Note (Signed)
Summary: hsfu e coli/need chart   Visit Type:  Follow-up Referring Provider:  Dr. Trula Slade Primary Provider:  Dr. Carlena Sax  CC:  hsfu and slight leg pain and pain at surgery site.  History of Present Illness:   70 yo  with history of CAD, Crohns disease, who underwent open aneurysm repair with an aorto-bi-external iliac graft in June 2011.  The patient's  postoperative course was complicated by dehiscence, which required a return trip to the operating room for abdominal wall closure.  This was  done on March 10, 2010.  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 He was brought to the ED with abdominal pain. He was taken urgently to the OR at [facility] by Dr. 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 The patient underwent Bilateral axillary-to-common femoral artery bypass graft with 8-mm  ringed PTFE . Explant of aorto-bi-external iliac graft with closure of aortic stump.  Ligation of left renal vein. Lysis of adhesions x 90 minutes, Primary repair of enterocutaneous fistul, Omental flap, Wound VAC temporary abdominal closure. Blood cultures and intraoperative cultures yielded E coli. HE was narrowed from vancomycin and zosyn intially to rocephin and then dc on oral levaquin 537m daily. He has done well since dc from the hospital and has remained afebrile and without diaphoresis or signs of systemic infection.        Dr. ACarlena Sax  Current Allergies (reviewed today): No known allergies  Past History:  Past Medical History:  1. Crohn disease.   2. Coronary artery disease status post MI in 2006, status post       coronary stenting.   3. Abdominal aortic aneurysm   4. Hypertension.   5. Hypercholesterolemia.  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 Bilateral axillary-to-common femoral artery bypass graft with 8-mm       ringed PTFE (Propaten).   2. Explant of aorto-bi-external iliac graft with closure of aortic       stump.   3. Ligation of left renal vein.   4. Lysis of adhesions x 90 minutes.   5. Primary repair of enterocutaneous fistula.   6. Omental flap.   7. Wound VAC temporary abdominal closure.         Family History: :  Positive for lung cancer in his father, a liver disease   in his mother.  He has a sister with multiple myeloma.   Social History: quit tobacco after his last surgery. No etoh or other drugs. Veteran. Married. T  Review of Systems  The patient denies anorexia, fever, weight loss, weight gain, vision loss, decreased hearing, hoarseness, chest pain, syncope, dyspnea on exertion, peripheral edema, prolonged cough, headaches, hemoptysis, abdominal pain, melena, hematochezia, severe indigestion/heartburn, hematuria, incontinence, genital sores, muscle weakness, suspicious skin lesions, transient blindness, difficulty walking, depression, unusual weight change, abnormal bleeding, and enlarged lymph nodes.    Vital Signs:  Patient profile:   70year old male Height:      69 inches (175.26 cm) Weight:      146.50 pounds (66.59 kg) BMI:     21.71 Temp:     97.6 degrees F (36.44 degrees C) oral Pulse rate:   62 / minute BP  sitting:   128 / 76  (left arm)  Vitals Entered By: Jarrett Ables CMA (August 20, 2010 10:41 AM) CC: hsfu, slight leg pain and pain at surgery site Is Patient Diabetic? No Nutritional Status BMI of 19 -24 = normal  Does patient need assistance? Functional Status Self care Ambulation Normal   Physical Exam  General:  alert, well-nourished, and well-hydrated.   Head:  atraumatic.   Ears:  no external deformities.   Nose:  no external deformity and no external erythema.   Mouth:  pharynx  pink and moist and no erythema.   Lungs:  normal respiratory effort, no crackles, and no wheezes.   Heart:  normal rate, regular rhythm, no murmur, and no gallop.   Abdomen:  soft and no distention.   Msk:  normal ROM.   Neurologic:  alert & oriented X3 and gait normal.   Skin:  surgical scar well healed and without signficant erythema, no fluctuance. Psych:  Oriented X3, memory intact for recent and remote, and good eye contact.     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 I gave him a years worth of  rx for levaquin and will bring him back in 4 months Orders: T-CBC w/Diff ([id number]) T-Basic Metabolic Panel ([id number]) Est. Patient Level IV (18299)  Problem # 2:  INF&INFLAM REACT DUE UNSPEC DEVICE IMPLANT&GRAFT (ICD-996.60)  see above  Orders: Est. Patient Level IV (37169)  Problem # 3:  AAA (ICD-441.4)  see above  Orders: Est. Patient Level IV (67893)  Medications Added to Medication List This Visit: 1)  Levaquin 500 Mg Tabs (Levofloxacin) .Marland Kitchen.. 1 tablet by mouth indefinitely 2)  Aspir-low 81 Mg Tbec (Aspirin) .Marland Kitchen.. 1 once daily 3)  Atenolol 25 Mg Tabs (Atenolol) .Marland Kitchen.. 1 once daily 4)  Lisinopril 40 Mg Tabs (Lisinopril) .Marland Kitchen.. 1 once daily 5)  Mercaptopurine 50 Mg Tabs (Mercaptopurine) .Marland Kitchen.. 1 tablet by mouth 6)  Prilosec 20 Mg Cpdr (Omeprazole) .Marland Kitchen.. 1 once daily 7)  Simvastatin 80 Mg Tabs (Simvastatin) .Marland Kitchen.. 1 tablet 8)  Colace 50 Mg Caps (Docusate sodium) .... Take two tablets by mouth qhs  Patient Instructions: 1)  rtc in 4 months Prescriptions: LEVAQUIN 500 MG TABS (LEVOFLOXACIN) 1 tablet by mouth indefinitely  #90 x 4   Entered and Authorized by:   Alcide Evener MD   Signed by:   Rhina Brackett Dam MD on 08/20/2010   Method used:    Electronically to        [facility] [phone]* (retail)       [place], [place]  [address]       Ph: [phone]       Fax: [phone]   RxID:   [id number] LEVAQUIN 500 MG TABS (LEVOFLOXACIN) 1 tablet by mouth indefinitely  #90 x 4   Entered and Authorized by:   Alcide Evener MD   Signed by:   Alcide Evener MD on 08/20/2010   Method used:   Electronically to        [phone]* (retail)       [address]       [place], [place]  [address]       Ph: [phone]  Fax: [phone]   RxID:   [id number] COLACE 50 MG CAPS (DOCUSATE SODIUM) take two tablets by mouth qhs  #60 x 0   Entered and Authorized by:   Alcide Evener MD   Signed by:   Rhina Brackett Dam MD on 08/20/2010   Method used:   Print then Give to Patient   RxID:   [id number]

## 2010-11-25 NOTE — Consult Note (Signed)
Summary: [facility]  [facility]   Imported By: Florinda Marker 11/19/2010 10:25:34  _____________________________________________________________________  External Attachment:    Type:   Image     Comment:   External Document

## 2010-11-28 ENCOUNTER — Other Ambulatory Visit (HOSPITAL_COMMUNITY): Payer: Self-pay | Admitting: Family Medicine

## 2010-11-28 DIAGNOSIS — Z8679 Personal history of other diseases of the circulatory system: Secondary | ICD-10-CM

## 2010-12-02 ENCOUNTER — Other Ambulatory Visit (HOSPITAL_COMMUNITY): Payer: Self-pay

## 2010-12-02 ENCOUNTER — Encounter: Payer: Self-pay | Admitting: Licensed Clinical Social Worker

## 2010-12-03 ENCOUNTER — Other Ambulatory Visit (HOSPITAL_COMMUNITY): Payer: Self-pay

## 2010-12-08 ENCOUNTER — Other Ambulatory Visit (HOSPITAL_COMMUNITY): Payer: Self-pay

## 2010-12-08 ENCOUNTER — Ambulatory Visit (HOSPITAL_COMMUNITY)
Admission: RE | Admit: 2010-12-08 | Discharge: 2010-12-08 | Disposition: A | Discharge status: Home / Self Care | Payer: Medicare Other | Source: Ambulatory Visit | Attending: Family Medicine | Admitting: Family Medicine

## 2010-12-08 DIAGNOSIS — I700 Atherosclerosis of aorta: Secondary | ICD-10-CM | POA: Insufficient documentation

## 2010-12-08 DIAGNOSIS — I2510 Atherosclerotic heart disease of native coronary artery without angina pectoris: Secondary | ICD-10-CM | POA: Insufficient documentation

## 2010-12-08 DIAGNOSIS — K449 Diaphragmatic hernia without obstruction or gangrene: Secondary | ICD-10-CM | POA: Insufficient documentation

## 2010-12-08 DIAGNOSIS — Z09 Encounter for follow-up examination after completed treatment for conditions other than malignant neoplasm: Secondary | ICD-10-CM | POA: Insufficient documentation

## 2010-12-08 DIAGNOSIS — I719 Aortic aneurysm of unspecified site, without rupture: Secondary | ICD-10-CM | POA: Insufficient documentation

## 2010-12-08 DIAGNOSIS — J438 Other emphysema: Secondary | ICD-10-CM | POA: Insufficient documentation

## 2010-12-08 DIAGNOSIS — Z8679 Personal history of other diseases of the circulatory system: Secondary | ICD-10-CM

## 2010-12-08 IMAGING — CT CT CTA ABD/PEL W/CM AND/OR W/O CM
1 series · 1 of 1 positions shown · IV contrast (agent unspecified)
Comparison: [DATE]

CTA CHEST

CLINICAL DATA: Aortic aneurysm repair

CT ANGIOGRAPHY CHEST, ABDOMEN AND PELVIS
TECHNIQUE: Multidetector CT imaging through the chest, abdomen and
pelvis was performed using the standard protocol during bolus
administration of intravenous contrast.  Multiplanar reconstructed
images including MIPs were obtained and reviewed to evaluate the
vascular anatomy.
Contrast: 100 ml [FT] IV

[Series 1: scout · coronal · 0.6mm · 0.98mm/px · 1 of 1 slices shown]
[im 1/1]
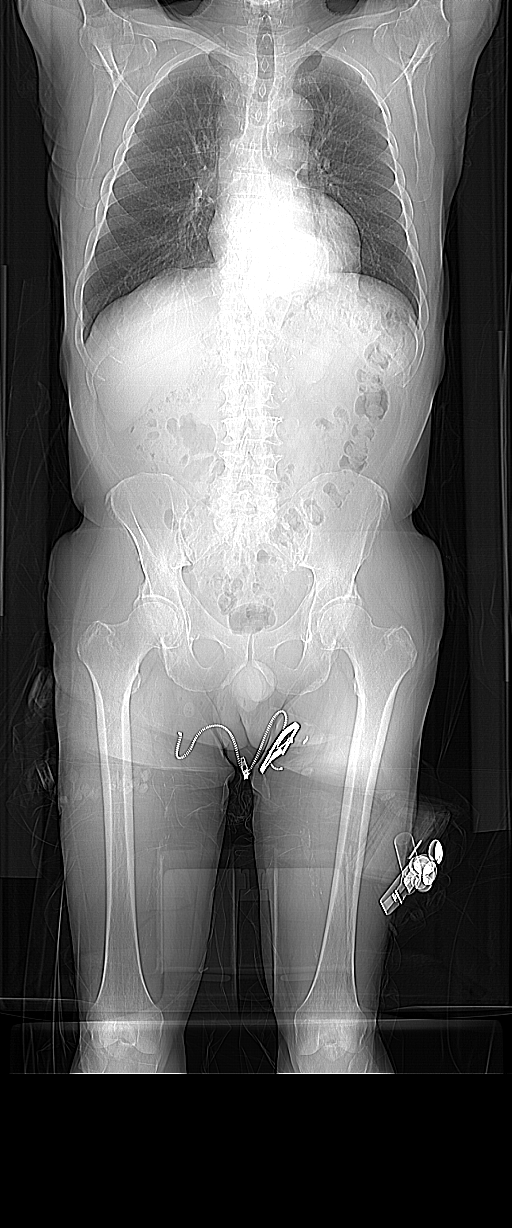

[1 of 1 positions shown; findings below may reference images not displayed]

FINDINGS: Patchy coronary and aortic calcified plaque.  There is
classic three-vessel brachiocephalic origin anatomy calcified
plaque in the origin of the subclavian artery resulting in less
than 50% diameter stenosis.
There are patent bilateral ax-fem grafts, with fluid along the
course of the synthetic grafts bilaterally. There is no
peripheral enhancement of the fluid collections.

There is a moderate hiatal hernia involving much of the gastric
fundus.  Sub centimeter AP window, precarinal, and pretracheal
lymph nodes.  No hilar adenopathy.  No pleural or pericardial
effusion.  Emphysematous changes in both lung apices.  Minimal
subpleural atelectasis or scarring in the posterior and medial
basal segments left lower lobe.

 Review of the MIP images confirms the above findings.
IMPRESSION: 1.  Patent bilateral ax-fem grafts with surrounding fluid.
2.  Coronary and aortic calcifications.
3.  Moderate hiatal hernia.

CTA ABDOMEN AND PELVIS
FINDINGS: Patchy calcified plaque in the suprarenal abdominal
aorta.

Celiac axis widely patent.

There is mild plaque in the proximal SMA without high-grade
stenosis.

There is eccentric mural thrombus in the abdominal aorta at and
below the level of the SMA origin.

There is calcified eccentric plaque at the origin of the single
right renal artery without significant stenosis.

There is calcified plaque at the origin of the single left renal
artery resulting at least 80% diameter stenosis over a short
segment less than 1 cm.

There is infrarenal aortic occlusion.  There is a thrombosed 2.5 cm
fusiform aneurysm of the left common iliac artery.

Patent bilateral ax-fem graft are noted into the common femoral
arteries.  The visualized proximal portions of the superficial
femoral arteries are widely patent with scattered calcified wall
plaque.

The venous phase was not obtained.

Unremarkable arterial phase evaluation of the liver, spleen,
adrenal glands, kidneys, pancreas.

The stomach and small bowel are decompressed.  Normal appendix.
Innumerable scattered diverticula throughout the colon without
adjacent inflammatory/edematous change.  Postop changes along the
bilateral common iliac and left external iliac arteries.

Urinary bladder is incompletely distended.  Central coarse
calcifications in an  enlarged prostate.  No ascites.  No free air.
There is collateral reconstitution of the internal iliac artery
branches bilaterally.

 Review of the MIP images confirms the above findings.
IMPRESSION: 1.  Infrarenal aortic occlusion with patent bilateral ax-fem
grafts.
2.  Left renal artery ostial stenosis of possible hemodynamic
significance.
3.  Extensive colonic diverticulosis.

## 2010-12-08 IMAGING — CT CT CTA ABD/PEL W/CM AND/OR W/O CM
2 of 7 series · 15 of 46 positions shown, 17 images · IV contrast (CONTRAST)
Comparison: [DATE]

CTA CHEST

CLINICAL DATA: Aortic aneurysm repair

CT ANGIOGRAPHY CHEST, ABDOMEN AND PELVIS
TECHNIQUE: Multidetector CT imaging through the chest, abdomen and
pelvis was performed using the standard protocol during bolus
administration of intravenous contrast.  Multiplanar reconstructed
images including MIPs were obtained and reviewed to evaluate the
vascular anatomy.
Contrast: 100 ml [FT] IV

[Series 4: soft tissue · axial · 0.81mm/px · z∈[-669,-59]mm · 12 of 339 slices shown, 14 images]
[im 17/339  soft-tissue]
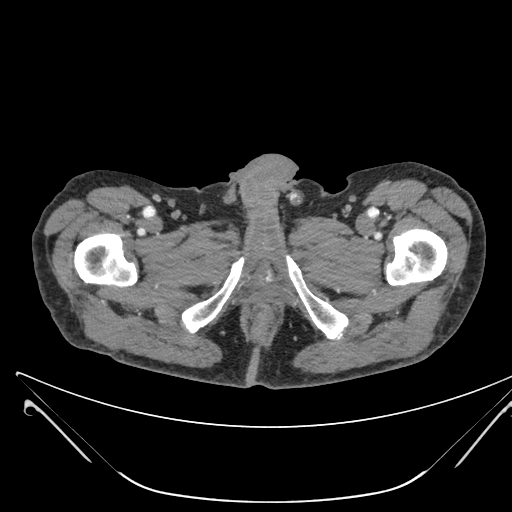
[im 17/339  bone]
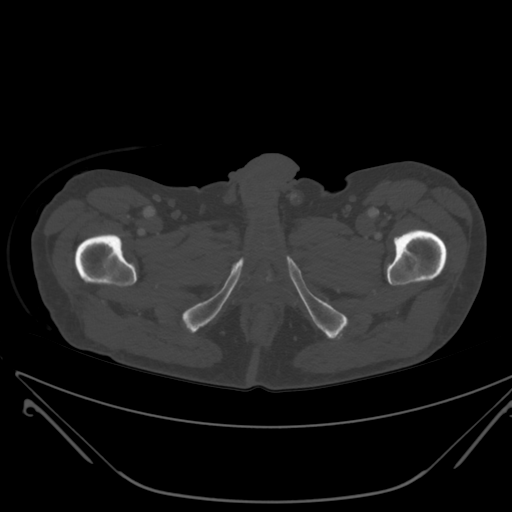
[im 51/339  soft-tissue]
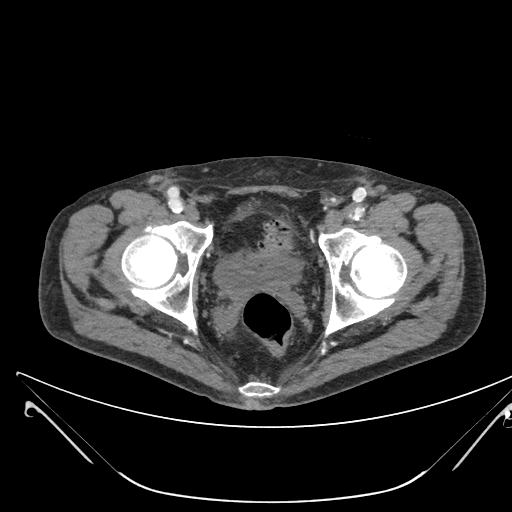
[im 68/339  soft-tissue]
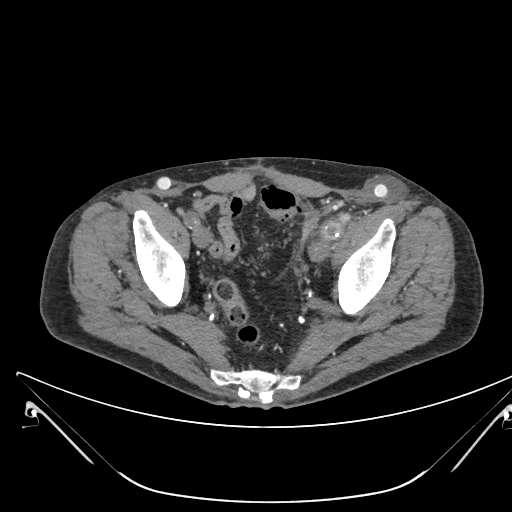
[im 102/339  soft-tissue]
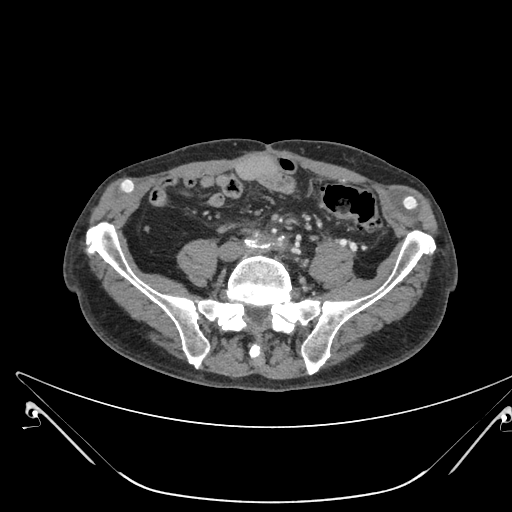
[im 136/339  soft-tissue]
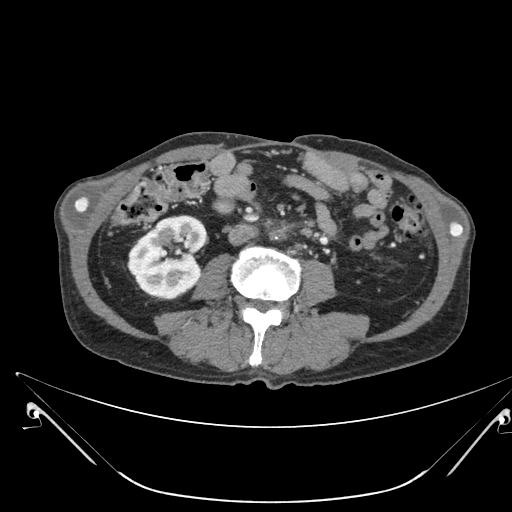
[im 153/339  soft-tissue]
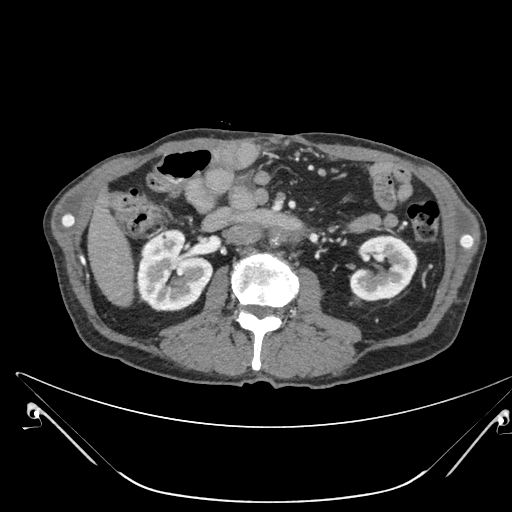
[im 186/339  soft-tissue]
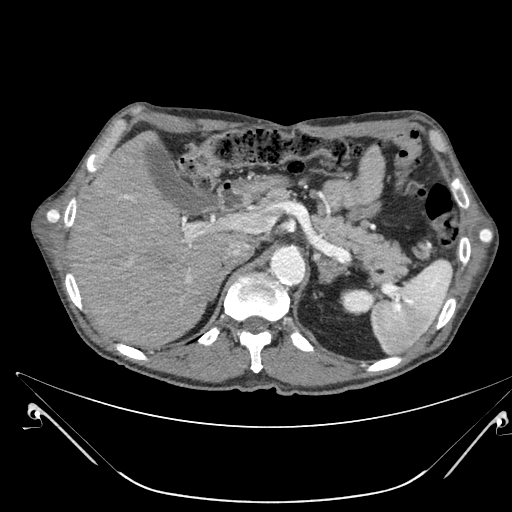
[im 203/339  soft-tissue]
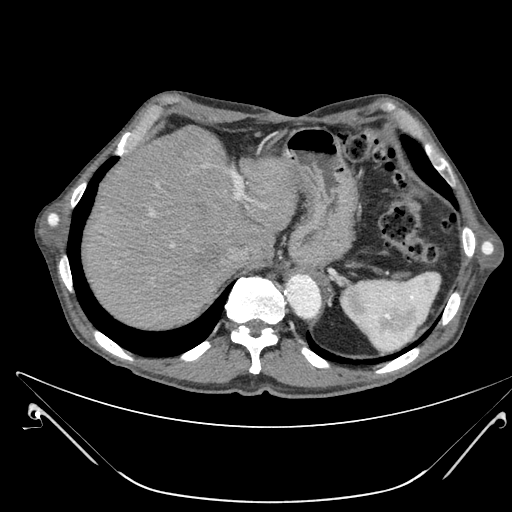
[im 237/339  soft-tissue]
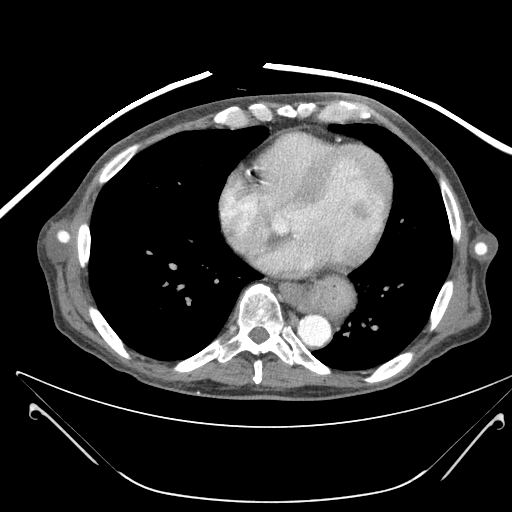
[im 237/339  bone]
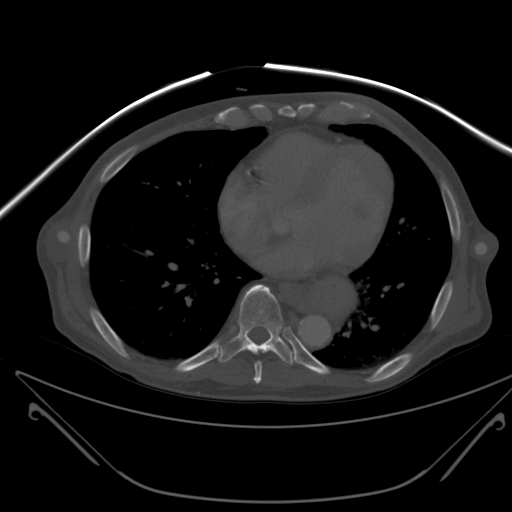
[im 271/339  soft-tissue]
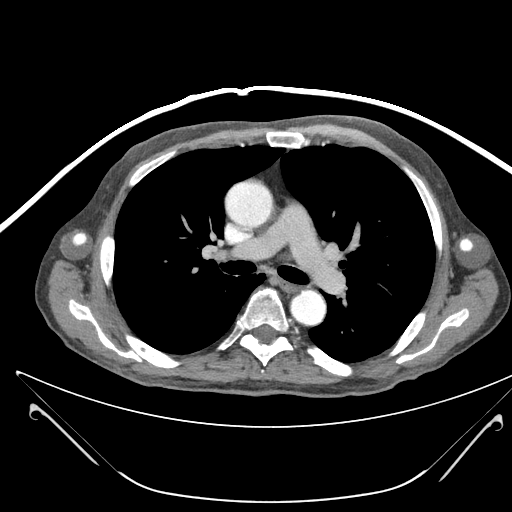
[im 288/339  soft-tissue]
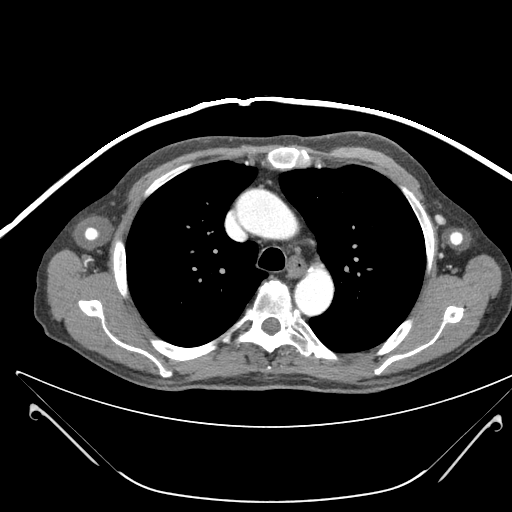
[im 322/339  soft-tissue]
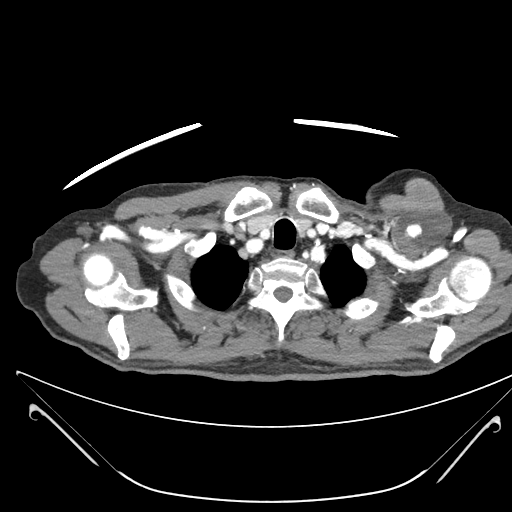

[coronals · coronal · 1.32mm/px · 3 of 119 slices shown]
[im 30/119  soft-tissue]
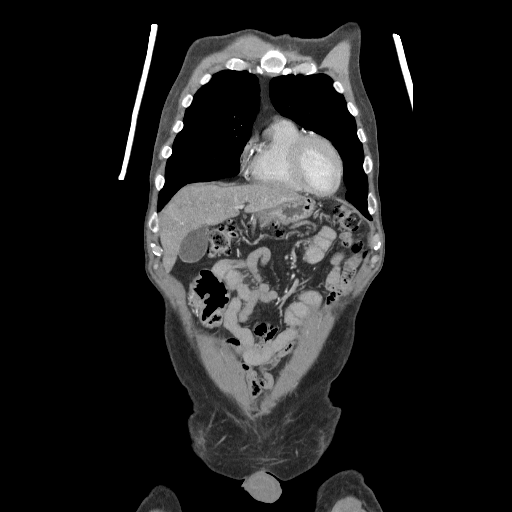
[im 60/119  soft-tissue]
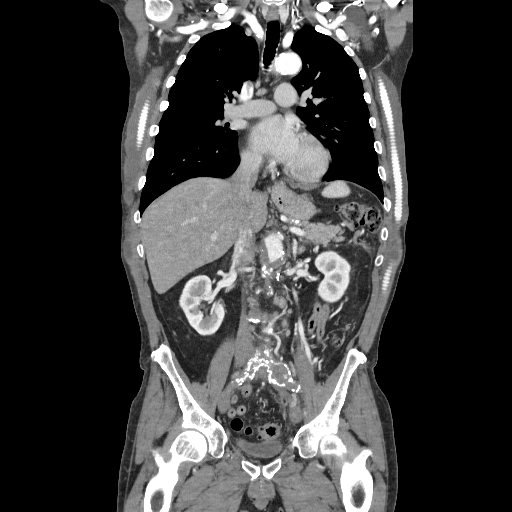
[im 89/119  soft-tissue]
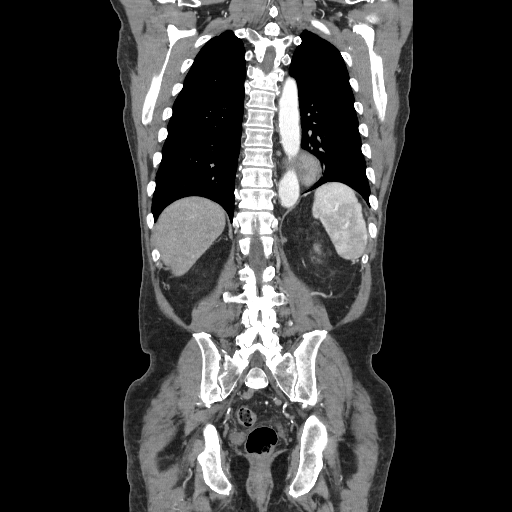

[15 of 46 positions shown; findings below may reference images not displayed]

FINDINGS: Patchy coronary and aortic calcified plaque.  There is
classic three-vessel brachiocephalic origin anatomy calcified
plaque in the origin of the subclavian artery resulting in less
than 50% diameter stenosis.
There are patent bilateral ax-fem grafts, with fluid along the
course of the synthetic grafts bilaterally. There is no
peripheral enhancement of the fluid collections.

There is a moderate hiatal hernia involving much of the gastric
fundus.  Sub centimeter AP window, precarinal, and pretracheal
lymph nodes.  No hilar adenopathy.  No pleural or pericardial
effusion.  Emphysematous changes in both lung apices.  Minimal
subpleural atelectasis or scarring in the posterior and medial
basal segments left lower lobe.

 Review of the MIP images confirms the above findings.
IMPRESSION: 1.  Patent bilateral ax-fem grafts with surrounding fluid.
2.  Coronary and aortic calcifications.
3.  Moderate hiatal hernia.

CTA ABDOMEN AND PELVIS
FINDINGS: Patchy calcified plaque in the suprarenal abdominal
aorta.

Celiac axis widely patent.

There is mild plaque in the proximal SMA without high-grade
stenosis.

There is eccentric mural thrombus in the abdominal aorta at and
below the level of the SMA origin.

There is calcified eccentric plaque at the origin of the single
right renal artery without significant stenosis.

There is calcified plaque at the origin of the single left renal
artery resulting at least 80% diameter stenosis over a short
segment less than 1 cm.

There is infrarenal aortic occlusion.  There is a thrombosed 2.5 cm
fusiform aneurysm of the left common iliac artery.

Patent bilateral ax-fem graft are noted into the common femoral
arteries.  The visualized proximal portions of the superficial
femoral arteries are widely patent with scattered calcified wall
plaque.

The venous phase was not obtained.

Unremarkable arterial phase evaluation of the liver, spleen,
adrenal glands, kidneys, pancreas.

The stomach and small bowel are decompressed.  Normal appendix.
Innumerable scattered diverticula throughout the colon without
adjacent inflammatory/edematous change.  Postop changes along the
bilateral common iliac and left external iliac arteries.

Urinary bladder is incompletely distended.  Central coarse
calcifications in an  enlarged prostate.  No ascites.  No free air.
There is collateral reconstitution of the internal iliac artery
branches bilaterally.

 Review of the MIP images confirms the above findings.
IMPRESSION: 1.  Infrarenal aortic occlusion with patent bilateral ax-fem
grafts.
2.  Left renal artery ostial stenosis of possible hemodynamic
significance.
3.  Extensive colonic diverticulosis.

## 2010-12-08 MED ORDER — IOHEXOL 300 MG/ML  SOLN
100.0000 mL | Freq: Once | INTRAMUSCULAR | Status: AC | PRN
  Administered 2010-12-08: 100 mL via INTRAVENOUS

## 2010-12-09 LAB — BASIC METABOLIC PANEL
BUN: 13 mg/dL (ref 6–23)
CO2: 25 mEq/L (ref 19–32)
CO2: 26 mEq/L (ref 19–32)
Calcium: 7.7 mg/dL — ABNORMAL LOW (ref 8.4–10.5)
Chloride: 108 mEq/L (ref 96–112)
Chloride: 109 mEq/L (ref 96–112)
Chloride: 109 mEq/L (ref 96–112)
Creatinine, Ser: 0.87 mg/dL (ref 0.4–1.5)
Creatinine, Ser: 0.91 mg/dL (ref 0.4–1.5)
GFR calc Af Amer: >60 mL/min (ref >60)
GFR calc Af Amer: >60 mL/min (ref >60)
Glucose, Bld: 104 mg/dL — ABNORMAL HIGH (ref 70–99)
Glucose, Bld: 111 mg/dL — ABNORMAL HIGH (ref 70–99)
Glucose, Bld: 88 mg/dL (ref 70–99)
Potassium: 3.2 mEq/L — ABNORMAL LOW (ref 3.5–5.1)
Potassium: 3.2 mEq/L — ABNORMAL LOW (ref 3.5–5.1)
Sodium: 140 mEq/L (ref 135–145)

## 2010-12-09 LAB — CBC
HCT: 25.2 % — ABNORMAL LOW (ref 39.0–52.0)
HCT: 25.3 % — ABNORMAL LOW (ref 39.0–52.0)
Hemoglobin: 8.5 g/dL — ABNORMAL LOW (ref 13.0–17.0)
MCH: 28.9 pg (ref 26.0–34.0)
MCH: 30 pg (ref 26.0–34.0)
MCH: 30.1 pg (ref 26.0–34.0)
MCHC: 32.4 g/dL (ref 30.0–36.0)
MCHC: 32.9 g/dL (ref 30.0–36.0)
MCHC: 33.6 g/dL (ref 30.0–36.0)
MCV: 89.4 fL (ref 78.0–100.0)
MCV: 91 fL (ref 78.0–100.0)
Platelets: 155 10*3/uL (ref 150–400)
RBC: 2.82 MIL/uL — ABNORMAL LOW (ref 4.22–5.81)
RBC: 3.11 MIL/uL — ABNORMAL LOW (ref 4.22–5.81)
RDW: 16.7 % — ABNORMAL HIGH (ref 11.5–15.5)

## 2010-12-09 LAB — CULTURE, BLOOD (ROUTINE X 2): Culture  Setup Time: 201111030254

## 2010-12-09 LAB — GLUCOSE, CAPILLARY
Glucose-Capillary: 84 mg/dL (ref 70–99)
Glucose-Capillary: 92 mg/dL (ref 70–99)

## 2010-12-10 LAB — POCT I-STAT, CHEM 8
BUN: 13 mg/dL (ref 6–23)
Chloride: 112 mEq/L (ref 96–112)
Creatinine, Ser: 0.6 mg/dL (ref 0.4–1.5)
Sodium: 150 mEq/L — ABNORMAL HIGH (ref 135–145)
TCO2: 19 mmol/L (ref 0–100)

## 2010-12-10 LAB — CBC
HCT: 17.4 % — ABNORMAL LOW (ref 39.0–52.0)
HCT: 21.1 % — ABNORMAL LOW (ref 39.0–52.0)
HCT: 23 % — ABNORMAL LOW (ref 39.0–52.0)
HCT: 23.5 % — ABNORMAL LOW (ref 39.0–52.0)
HCT: 24.3 % — ABNORMAL LOW (ref 39.0–52.0)
Hemoglobin: 5.9 g/dL — CL (ref 13.0–17.0)
Hemoglobin: 6.8 g/dL — CL (ref 13.0–17.0)
Hemoglobin: 8.1 g/dL — ABNORMAL LOW (ref 13.0–17.0)
Hemoglobin: 8.1 g/dL — ABNORMAL LOW (ref 13.0–17.0)
Hemoglobin: 8.1 g/dL — ABNORMAL LOW (ref 13.0–17.0)
Hemoglobin: 8.2 g/dL — ABNORMAL LOW (ref 13.0–17.0)
MCH: 28.4 pg (ref 26.0–34.0)
MCH: 29.1 pg (ref 26.0–34.0)
MCH: 29.1 pg (ref 26.0–34.0)
MCH: 29.2 pg (ref 26.0–34.0)
MCH: 29.2 pg (ref 26.0–34.0)
MCH: 29.8 pg (ref 26.0–34.0)
MCH: 29.9 pg (ref 26.0–34.0)
MCHC: 33.3 g/dL (ref 30.0–36.0)
MCHC: 33.9 g/dL (ref 30.0–36.0)
MCHC: 34.7 g/dL (ref 30.0–36.0)
MCHC: 35.1 g/dL (ref 30.0–36.0)
MCHC: 35.4 g/dL (ref 30.0–36.0)
MCHC: 32.9 g/dL (ref 30.0–36.0)
MCV: 83.3 fL (ref 78.0–100.0)
MCV: 83.4 fL (ref 78.0–100.0)
MCV: 83.7 fL (ref 78.0–100.0)
MCV: 83.7 fL (ref 78.0–100.0)
MCV: 83.8 fL (ref 78.0–100.0)
MCV: 84 fL (ref 78.0–100.0)
MCV: 84.2 fL (ref 78.0–100.0)
MCV: 85.2 fL (ref 78.0–100.0)
MCV: 87.4 fL (ref 78.0–100.0)
MCV: 88.7 fL (ref 78.0–100.0)
MCV: 90.8 fL (ref 78.0–100.0)
Platelets: 57 10*3/uL — ABNORMAL LOW (ref 150–400)
Platelets: 58 10*3/uL — ABNORMAL LOW (ref 150–400)
Platelets: 58 10*3/uL — ABNORMAL LOW (ref 150–400)
Platelets: 59 10*3/uL — ABNORMAL LOW (ref 150–400)
Platelets: 60 10*3/uL — ABNORMAL LOW (ref 150–400)
Platelets: 70 10*3/uL — ABNORMAL LOW (ref 150–400)
Platelets: 75 10*3/uL — ABNORMAL LOW (ref 150–400)
Platelets: 79 10*3/uL — ABNORMAL LOW (ref 150–400)
Platelets: 86 10*3/uL — ABNORMAL LOW (ref 150–400)
Platelets: 98 10*3/uL — ABNORMAL LOW (ref 150–400)
Platelets: 333 10*3/uL (ref 150–400)
RBC: 2.08 MIL/uL — ABNORMAL LOW (ref 4.22–5.81)
RBC: 2.34 MIL/uL — ABNORMAL LOW (ref 4.22–5.81)
RBC: 2.77 MIL/uL — ABNORMAL LOW (ref 4.22–5.81)
RBC: 2.89 MIL/uL — ABNORMAL LOW (ref 4.22–5.81)
RBC: 3.01 MIL/uL — ABNORMAL LOW (ref 4.22–5.81)
RBC: 3.5 MIL/uL — ABNORMAL LOW (ref 4.22–5.81)
RDW: 14.3 % (ref 11.5–15.5)
RDW: 14.4 % (ref 11.5–15.5)
RDW: 14.4 % (ref 11.5–15.5)
RDW: 14.6 % (ref 11.5–15.5)
RDW: 14.6 % (ref 11.5–15.5)
RDW: 14.6 % (ref 11.5–15.5)
RDW: 14.8 % (ref 11.5–15.5)
RDW: 14.9 % (ref 11.5–15.5)
RDW: 15.2 % (ref 11.5–15.5)
RDW: 15.6 % — ABNORMAL HIGH (ref 11.5–15.5)
RDW: 19.3 % — ABNORMAL HIGH (ref 11.5–15.5)
WBC: 5 10*3/uL (ref 4.0–10.5)
WBC: 5 10*3/uL (ref 4.0–10.5)
WBC: 5.1 10*3/uL (ref 4.0–10.5)
WBC: 5.2 10*3/uL (ref 4.0–10.5)
WBC: 5.5 10*3/uL (ref 4.0–10.5)
WBC: 6.1 10*3/uL (ref 4.0–10.5)
WBC: 6.1 10*3/uL (ref 4.0–10.5)
WBC: 6.4 10*3/uL (ref 4.0–10.5)
WBC: 6.5 10*3/uL (ref 4.0–10.5)
WBC: 6.8 10*3/uL (ref 4.0–10.5)
WBC: 7.7 10*3/uL (ref 4.0–10.5)
WBC: 8.2 10*3/uL (ref 4.0–10.5)
WBC: 10.5 10*3/uL (ref 4.0–10.5)

## 2010-12-10 LAB — POCT I-STAT 3, ART BLOOD GAS (G3+)
Acid-Base Excess: 5 mmol/L — ABNORMAL HIGH (ref 0.0–2.0)
Acid-base deficit: 3 mmol/L — ABNORMAL HIGH (ref 0.0–2.0)
Bicarbonate: 21 mEq/L (ref 20.0–24.0)
Bicarbonate: 21.6 mEq/L (ref 20.0–24.0)
Bicarbonate: 25.9 mEq/L — ABNORMAL HIGH (ref 20.0–24.0)
Bicarbonate: 28 mEq/L — ABNORMAL HIGH (ref 20.0–24.0)
Bicarbonate: 28.7 mEq/L — ABNORMAL HIGH (ref 20.0–24.0)
O2 Saturation: 100 %
O2 Saturation: 98 %
O2 Saturation: 99 %
Patient temperature: 35.4
Patient temperature: 36.2
Patient temperature: 97.7
Patient temperature: 98
TCO2: 20 mmol/L (ref 0–100)
TCO2: 25 mmol/L (ref 0–100)
TCO2: 29 mmol/L (ref 0–100)
TCO2: 30 mmol/L (ref 0–100)
pCO2 arterial: 33.9 mmHg — ABNORMAL LOW (ref 35.0–45.0)
pCO2 arterial: 34.2 mmHg — ABNORMAL LOW (ref 35.0–45.0)
pCO2 arterial: 36.2 mmHg (ref 35.0–45.0)
pH, Arterial: 7.387 (ref 7.350–7.450)
pH, Arterial: 7.406 (ref 7.350–7.450)
pH, Arterial: 7.485 — ABNORMAL HIGH (ref 7.350–7.450)
pH, Arterial: 7.498 — ABNORMAL HIGH (ref 7.350–7.450)
pO2, Arterial: 129 mmHg — ABNORMAL HIGH (ref 80.0–100.0)
pO2, Arterial: 134 mmHg — ABNORMAL HIGH (ref 80.0–100.0)
pO2, Arterial: 91 mmHg (ref 80.0–100.0)
pO2, Arterial: 91 mmHg (ref 80.0–100.0)

## 2010-12-10 LAB — POCT I-STAT 7, (LYTES, BLD GAS, ICA,H+H)
Acid-base deficit: 1 mmol/L (ref 0.0–2.0)
Acid-base deficit: 14 mmol/L — ABNORMAL HIGH (ref 0.0–2.0)
Acid-base deficit: 9 mmol/L — ABNORMAL HIGH (ref 0.0–2.0)
Bicarbonate: 14.7 mEq/L — ABNORMAL LOW (ref 20.0–24.0)
Bicarbonate: 16.8 mEq/L — ABNORMAL LOW (ref 20.0–24.0)
Bicarbonate: 17.7 mEq/L — ABNORMAL LOW (ref 20.0–24.0)
Bicarbonate: 23.3 mEq/L (ref 20.0–24.0)
Bicarbonate: 24.6 mEq/L — ABNORMAL HIGH (ref 20.0–24.0)
Calcium, Ion: 0.96 mmol/L — ABNORMAL LOW (ref 1.12–1.32)
Calcium, Ion: 1.08 mmol/L — ABNORMAL LOW (ref 1.12–1.32)
HCT: 22 % — ABNORMAL LOW (ref 39.0–52.0)
HCT: 23 % — ABNORMAL LOW (ref 39.0–52.0)
Hemoglobin: 10.9 g/dL — ABNORMAL LOW (ref 13.0–17.0)
Hemoglobin: 11.6 g/dL — ABNORMAL LOW (ref 13.0–17.0)
Hemoglobin: 7.5 g/dL — ABNORMAL LOW (ref 13.0–17.0)
Hemoglobin: 9.9 g/dL — ABNORMAL LOW (ref 13.0–17.0)
O2 Saturation: 100 %
Patient temperature: 36.6
Patient temperature: 36.9
Patient temperature: 37.2
Sodium: 135 mEq/L (ref 135–145)
Sodium: 141 mEq/L (ref 135–145)
Sodium: 144 mEq/L (ref 135–145)
TCO2: 16 mmol/L (ref 0–100)
TCO2: 18 mmol/L (ref 0–100)
TCO2: 24 mmol/L (ref 0–100)
TCO2: 26 mmol/L (ref 0–100)
pCO2 arterial: 38.3 mmHg (ref 35.0–45.0)
pCO2 arterial: 38.5 mmHg (ref 35.0–45.0)
pCO2 arterial: 38.9 mmHg (ref 35.0–45.0)
pCO2 arterial: 47.7 mmHg — ABNORMAL HIGH (ref 35.0–45.0)
pH, Arterial: 7.108 — CL (ref 7.350–7.450)
pH, Arterial: 7.152 — CL (ref 7.350–7.450)
pH, Arterial: 7.393 (ref 7.350–7.450)
pO2, Arterial: 367 mmHg — ABNORMAL HIGH (ref 80.0–100.0)
pO2, Arterial: 432 mmHg — ABNORMAL HIGH (ref 80.0–100.0)
pO2, Arterial: 450 mmHg — ABNORMAL HIGH (ref 80.0–100.0)

## 2010-12-10 LAB — CROSSMATCH
ABO/RH(D): O POS
Antibody Screen: NEGATIVE
Unit division: 0
Unit division: 0
Unit division: 0
Unit division: 0
Unit division: 0
Unit division: 0
Unit division: 0
Unit division: 0
Unit division: 0
Unit division: 0
Unit division: 0
Unit division: 0
Unit division: 0
Unit division: 0
Unit division: 0
Unit division: 0
Unit division: 0
Unit division: 0
Unit division: 0
Unit division: 0

## 2010-12-10 LAB — PREPARE FRESH FROZEN PLASMA
Unit division: 0
Unit division: 0
Unit division: 0
Unit division: 0
Unit division: 0
Unit division: 0
Unit division: 0
Unit division: 0
Unit division: 0

## 2010-12-10 LAB — BASIC METABOLIC PANEL
BUN: 14 mg/dL (ref 6–23)
CO2: 19 mEq/L (ref 19–32)
CO2: 27 mEq/L (ref 19–32)
Calcium: 5.8 mg/dL — CL (ref 8.4–10.5)
Calcium: 7.1 mg/dL — ABNORMAL LOW (ref 8.4–10.5)
Chloride: 114 mEq/L — ABNORMAL HIGH (ref 96–112)
Creatinine, Ser: 1.06 mg/dL (ref 0.4–1.5)
GFR calc Af Amer: >60 mL/min (ref >60)
Glucose, Bld: 147 mg/dL — ABNORMAL HIGH (ref 70–99)
Glucose, Bld: 91 mg/dL (ref 70–99)
Potassium: 3.7 mEq/L (ref 3.5–5.1)
Sodium: 144 mEq/L (ref 135–145)

## 2010-12-10 LAB — DIC (DISSEMINATED INTRAVASCULAR COAGULATION)PANEL
D-Dimer, Quant: 10.65 ug/mL-FEU — ABNORMAL HIGH (ref 0.00–0.48)
D-Dimer, Quant: 7.89 ug/mL-FEU — ABNORMAL HIGH (ref 0.00–0.48)
Fibrinogen: 259 mg/dL (ref 204–475)
Fibrinogen: 310 mg/dL (ref 204–475)
INR: 1.31 (ref 0.00–1.49)
INR: 1.34 (ref 0.00–1.49)
Platelets: 51 10*3/uL — ABNORMAL LOW (ref 150–400)
Platelets: 52 10*3/uL — ABNORMAL LOW (ref 150–400)
Platelets: 54 10*3/uL — ABNORMAL LOW (ref 150–400)
Prothrombin Time: 16.8 seconds — ABNORMAL HIGH (ref 11.6–15.2)
Prothrombin Time: 17.1 seconds — ABNORMAL HIGH (ref 11.6–15.2)
Smear Review: NONE SEEN
aPTT: 35 seconds (ref 24–37)
aPTT: 40 seconds — ABNORMAL HIGH (ref 24–37)

## 2010-12-10 LAB — COMPREHENSIVE METABOLIC PANEL
ALT: 255 U/L — ABNORMAL HIGH (ref 0–53)
ALT: 339 U/L — ABNORMAL HIGH (ref 0–53)
ALT: 78 U/L — ABNORMAL HIGH (ref 0–53)
AST: 100 U/L — ABNORMAL HIGH (ref 0–37)
AST: 103 U/L — ABNORMAL HIGH (ref 0–37)
AST: 114 U/L — ABNORMAL HIGH (ref 0–37)
AST: 186 U/L — ABNORMAL HIGH (ref 0–37)
AST: 63 U/L — ABNORMAL HIGH (ref 0–37)
Albumin: 1.6 g/dL — ABNORMAL LOW (ref 3.5–5.2)
Albumin: 1.7 g/dL — ABNORMAL LOW (ref 3.5–5.2)
Albumin: 2.1 g/dL — ABNORMAL LOW (ref 3.5–5.2)
Albumin: 2.3 g/dL — ABNORMAL LOW (ref 3.5–5.2)
Albumin: 2.9 g/dL — ABNORMAL LOW (ref 3.5–5.2)
Alkaline Phosphatase: 58 U/L (ref 39–117)
Alkaline Phosphatase: 61 U/L (ref 39–117)
BUN: 19 mg/dL (ref 6–23)
BUN: 29 mg/dL — ABNORMAL HIGH (ref 6–23)
CO2: 24 mEq/L (ref 19–32)
CO2: 24 mEq/L (ref 19–32)
CO2: 28 mEq/L (ref 19–32)
Calcium: 6.8 mg/dL — ABNORMAL LOW (ref 8.4–10.5)
Calcium: 7.4 mg/dL — ABNORMAL LOW (ref 8.4–10.5)
Calcium: 7.4 mg/dL — ABNORMAL LOW (ref 8.4–10.5)
Calcium: 8.9 mg/dL (ref 8.4–10.5)
Chloride: 111 mEq/L (ref 96–112)
Chloride: 114 mEq/L — ABNORMAL HIGH (ref 96–112)
Chloride: 115 mEq/L — ABNORMAL HIGH (ref 96–112)
Creatinine, Ser: 1.2 mg/dL (ref 0.4–1.5)
Creatinine, Ser: 1.25 mg/dL (ref 0.4–1.5)
Creatinine, Ser: 1.28 mg/dL (ref 0.4–1.5)
Creatinine, Ser: 1.34 mg/dL (ref 0.4–1.5)
Creatinine, Ser: 0.97 mg/dL (ref 0.4–1.5)
GFR calc Af Amer: >60 mL/min (ref >60)
GFR calc Af Amer: >60 mL/min (ref >60)
GFR calc Af Amer: >60 mL/min (ref >60)
GFR calc Af Amer: >60 mL/min (ref >60)
GFR calc Af Amer: >60 mL/min (ref >60)
GFR calc non Af Amer: 56 mL/min — ABNORMAL LOW (ref >60)
GFR calc non Af Amer: >60 mL/min (ref >60)
GFR calc non Af Amer: >60 mL/min (ref >60)
Potassium: 3.4 mEq/L — ABNORMAL LOW (ref 3.5–5.1)
Potassium: 3.4 mEq/L — ABNORMAL LOW (ref 3.5–5.1)
Potassium: 3.5 mEq/L (ref 3.5–5.1)
Potassium: 3.5 mEq/L (ref 3.5–5.1)
Potassium: 4.6 mEq/L (ref 3.5–5.1)
Sodium: 143 mEq/L (ref 135–145)
Sodium: 144 mEq/L (ref 135–145)
Sodium: 149 mEq/L — ABNORMAL HIGH (ref 135–145)
Total Bilirubin: 1.1 mg/dL (ref 0.3–1.2)
Total Bilirubin: 4.1 mg/dL — ABNORMAL HIGH (ref 0.3–1.2)
Total Bilirubin: 4.9 mg/dL — ABNORMAL HIGH (ref 0.3–1.2)
Total Protein: 3.9 g/dL — ABNORMAL LOW (ref 6.0–8.3)
Total Protein: 3.9 g/dL — ABNORMAL LOW (ref 6.0–8.3)
Total Protein: 4.5 g/dL — ABNORMAL LOW (ref 6.0–8.3)
Total Protein: 6.8 g/dL (ref 6.0–8.3)

## 2010-12-10 LAB — URINALYSIS, ROUTINE W REFLEX MICROSCOPIC
Hgb urine dipstick: NEGATIVE
Leukocytes, UA: NEGATIVE
Specific Gravity, Urine: 1.022 (ref 1.005–1.030)
Urobilinogen, UA: 0.2 mg/dL (ref 0.0–1.0)

## 2010-12-10 LAB — POTASSIUM: Potassium: 3.2 mEq/L — ABNORMAL LOW (ref 3.5–5.1)

## 2010-12-10 LAB — PREPARE PLATELETS
Unit division: 0
Unit division: 0
Unit division: 0

## 2010-12-10 LAB — GLUCOSE, CAPILLARY
Glucose-Capillary: 102 mg/dL — ABNORMAL HIGH (ref 70–99)
Glucose-Capillary: 106 mg/dL — ABNORMAL HIGH (ref 70–99)
Glucose-Capillary: 110 mg/dL — ABNORMAL HIGH (ref 70–99)
Glucose-Capillary: 122 mg/dL — ABNORMAL HIGH (ref 70–99)
Glucose-Capillary: 69 mg/dL — ABNORMAL LOW (ref 70–99)
Glucose-Capillary: 76 mg/dL (ref 70–99)
Glucose-Capillary: 77 mg/dL (ref 70–99)
Glucose-Capillary: 79 mg/dL (ref 70–99)
Glucose-Capillary: 81 mg/dL (ref 70–99)
Glucose-Capillary: 82 mg/dL (ref 70–99)
Glucose-Capillary: 85 mg/dL (ref 70–99)
Glucose-Capillary: 86 mg/dL (ref 70–99)
Glucose-Capillary: 86 mg/dL (ref 70–99)
Glucose-Capillary: 91 mg/dL (ref 70–99)
Glucose-Capillary: 91 mg/dL (ref 70–99)
Glucose-Capillary: 92 mg/dL (ref 70–99)
Glucose-Capillary: 93 mg/dL (ref 70–99)

## 2010-12-10 LAB — DIFFERENTIAL
Basophils Relative: 0 % (ref 0–1)
Lymphocytes Relative: 2 % — ABNORMAL LOW (ref 12–46)
Monocytes Relative: 5 % (ref 3–12)
Neutro Abs: 9.8 10*3/uL — ABNORMAL HIGH (ref 1.7–7.7)
Neutrophils Relative %: 93 % — ABNORMAL HIGH (ref 43–77)

## 2010-12-10 LAB — POCT I-STAT 4, (NA,K, GLUC, HGB,HCT)
Glucose, Bld: 112 mg/dL — ABNORMAL HIGH (ref 70–99)
HCT: 31 % — ABNORMAL LOW (ref 39.0–52.0)
Potassium: 3.6 mEq/L (ref 3.5–5.1)
Potassium: 4.2 mEq/L (ref 3.5–5.1)
Sodium: 142 mEq/L (ref 135–145)
Sodium: 143 mEq/L (ref 135–145)

## 2010-12-10 LAB — HEMOCCULT GUIAC POC 1CARD (OFFICE)
Fecal Occult Bld: POSITIVE
Fecal Occult Bld: POSITIVE

## 2010-12-10 LAB — PROTIME-INR
INR: 1.31 (ref 0.00–1.49)
INR: 1.2 (ref 0.00–1.49)
Prothrombin Time: 16.5 seconds — ABNORMAL HIGH (ref 11.6–15.2)
Prothrombin Time: 36.6 seconds — ABNORMAL HIGH (ref 11.6–15.2)
Prothrombin Time: 15.4 seconds — ABNORMAL HIGH (ref 11.6–15.2)

## 2010-12-10 LAB — HEMOGLOBIN AND HEMATOCRIT, BLOOD
HCT: 8.3 % — ABNORMAL LOW (ref 39.0–52.0)
Hemoglobin: 2.7 g/dL — CL (ref 13.0–17.0)

## 2010-12-10 LAB — CULTURE, BLOOD (ROUTINE X 2)
Culture  Setup Time: 201110261626
Culture: NO GROWTH

## 2010-12-10 LAB — PHOSPHORUS
Phosphorus: 3.4 mg/dL (ref 2.3–4.6)
Phosphorus: 3.6 mg/dL (ref 2.3–4.6)
Phosphorus: 3.9 mg/dL (ref 2.3–4.6)

## 2010-12-10 LAB — FIBRINOGEN: Fibrinogen: 114 mg/dL — ABNORMAL LOW (ref 204–475)

## 2010-12-10 LAB — SODIUM, URINE, RANDOM: Sodium, Ur: 126 mEq/L

## 2010-12-10 LAB — CALCIUM, IONIZED
Calcium, Ion: 0.98 mmol/L — ABNORMAL LOW (ref 1.12–1.32)
Calcium, Ion: 1.07 mmol/L — ABNORMAL LOW (ref 1.12–1.32)

## 2010-12-10 LAB — WOUND CULTURE

## 2010-12-10 LAB — CK TOTAL AND CKMB (NOT AT ARMC)
CK, MB: 7.2 ng/mL (ref 0.3–4.0)
Relative Index: 3.1 — ABNORMAL HIGH (ref 0.0–2.5)
Total CK: 185 U/L (ref 7–232)

## 2010-12-10 LAB — ANAEROBIC CULTURE

## 2010-12-10 LAB — URINE MICROSCOPIC-ADD ON

## 2010-12-10 LAB — PREPARE CRYOPRECIPITATE: Unit division: 0

## 2010-12-10 LAB — ABO/RH: ABO/RH(D): O POS

## 2010-12-10 LAB — APTT: aPTT: 36 seconds (ref 24–37)

## 2010-12-14 LAB — DIFFERENTIAL
Basophils Absolute: 0 10*3/uL (ref 0.0–0.1)
Basophils Absolute: 0 10*3/uL (ref 0.0–0.1)
Basophils Relative: 0 % (ref 0–1)
Basophils Relative: 0 % (ref 0–1)
Eosinophils Absolute: 0 10*3/uL (ref 0.0–0.7)
Eosinophils Absolute: 0.1 10*3/uL (ref 0.0–0.7)
Eosinophils Relative: 0 % (ref 0–5)
Eosinophils Relative: 1 % (ref 0–5)
Lymphocytes Relative: 1 % — ABNORMAL LOW (ref 12–46)
Lymphs Abs: 0.2 10*3/uL — ABNORMAL LOW (ref 0.7–4.0)
Monocytes Absolute: 1.3 10*3/uL — ABNORMAL HIGH (ref 0.1–1.0)
Monocytes Relative: 8 % (ref 3–12)
Neutro Abs: 15.4 10*3/uL — ABNORMAL HIGH (ref 1.7–7.7)
Neutrophils Relative %: 86 % — ABNORMAL HIGH (ref 43–77)
Neutrophils Relative %: 91 % — ABNORMAL HIGH (ref 43–77)

## 2010-12-14 LAB — COMPREHENSIVE METABOLIC PANEL
ALT: 13 U/L (ref 0–53)
AST: 14 U/L (ref 0–37)
AST: 17 U/L (ref 0–37)
Albumin: 2.5 g/dL — ABNORMAL LOW (ref 3.5–5.2)
Alkaline Phosphatase: 50 U/L (ref 39–117)
BUN: 36 mg/dL — ABNORMAL HIGH (ref 6–23)
CO2: 28 mEq/L (ref 19–32)
Calcium: 8.2 mg/dL — ABNORMAL LOW (ref 8.4–10.5)
Chloride: 100 mEq/L (ref 96–112)
Chloride: 101 mEq/L (ref 96–112)
Creatinine, Ser: 1.33 mg/dL (ref 0.4–1.5)
GFR calc Af Amer: >60 mL/min (ref >60)
GFR calc Af Amer: >60 mL/min (ref >60)
GFR calc non Af Amer: 54 mL/min — ABNORMAL LOW (ref >60)
GFR calc non Af Amer: >60 mL/min (ref >60)
Glucose, Bld: 76 mg/dL (ref 70–99)
Sodium: 142 mEq/L (ref 135–145)
Total Bilirubin: 0.8 mg/dL (ref 0.3–1.2)
Total Bilirubin: 1 mg/dL (ref 0.3–1.2)

## 2010-12-14 LAB — CBC
HCT: 29.2 % — ABNORMAL LOW (ref 39.0–52.0)
HCT: 35.1 % — ABNORMAL LOW (ref 39.0–52.0)
Hemoglobin: 10.9 g/dL — ABNORMAL LOW (ref 13.0–17.0)
Hemoglobin: 11.6 g/dL — ABNORMAL LOW (ref 13.0–17.0)
MCHC: 32.1 g/dL (ref 30.0–36.0)
MCHC: 32.7 g/dL (ref 30.0–36.0)
MCHC: 33 g/dL (ref 30.0–36.0)
MCV: 103.7 fL — ABNORMAL HIGH (ref 78.0–100.0)
MCV: 104 fL — ABNORMAL HIGH (ref 78.0–100.0)
MCV: 104.5 fL — ABNORMAL HIGH (ref 78.0–100.0)
Platelets: 300 10*3/uL (ref 150–400)
Platelets: 405 10*3/uL — ABNORMAL HIGH (ref 150–400)
RBC: 3.26 MIL/uL — ABNORMAL LOW (ref 4.22–5.81)
RBC: 3.39 MIL/uL — ABNORMAL LOW (ref 4.22–5.81)
RDW: 14.9 % (ref 11.5–15.5)
WBC: 17 10*3/uL — ABNORMAL HIGH (ref 4.0–10.5)
WBC: 9 10*3/uL (ref 4.0–10.5)

## 2010-12-14 LAB — HEPATIC FUNCTION PANEL
ALT: 14 U/L (ref 0–53)
AST: 17 U/L (ref 0–37)
Albumin: 3.3 g/dL — ABNORMAL LOW (ref 3.5–5.2)
Bilirubin, Direct: 0.2 mg/dL (ref 0.0–0.3)
Total Protein: 6.7 g/dL (ref 6.0–8.3)

## 2010-12-14 LAB — URINE CULTURE: Special Requests: NEGATIVE

## 2010-12-14 LAB — URINALYSIS, ROUTINE W REFLEX MICROSCOPIC
Glucose, UA: NEGATIVE mg/dL
Leukocytes, UA: NEGATIVE
Nitrite: NEGATIVE
Protein, ur: NEGATIVE mg/dL
Specific Gravity, Urine: 1.025 (ref 1.005–1.030)
Urobilinogen, UA: 0.2 mg/dL (ref 0.0–1.0)
pH: 5 (ref 5.0–8.0)

## 2010-12-14 LAB — LIPASE, BLOOD: Lipase: 22 U/L (ref 11–59)

## 2010-12-14 LAB — BASIC METABOLIC PANEL
CO2: 29 mEq/L (ref 19–32)
Calcium: 9.5 mg/dL (ref 8.4–10.5)
Creatinine, Ser: 1.76 mg/dL — ABNORMAL HIGH (ref 0.4–1.5)
GFR calc non Af Amer: 39 mL/min — ABNORMAL LOW (ref >60)
Glucose, Bld: 132 mg/dL — ABNORMAL HIGH (ref 70–99)
Sodium: 140 mEq/L (ref 135–145)

## 2010-12-14 LAB — MAGNESIUM: Magnesium: 2 mg/dL (ref 1.5–2.5)

## 2010-12-14 LAB — URINE MICROSCOPIC-ADD ON

## 2010-12-14 LAB — PHOSPHORUS: Phosphorus: 2.7 mg/dL (ref 2.3–4.6)

## 2010-12-15 LAB — TYPE AND SCREEN

## 2010-12-15 LAB — CBC
HCT: 31.5 % — ABNORMAL LOW (ref 39.0–52.0)
Hemoglobin: 10.8 g/dL — ABNORMAL LOW (ref 13.0–17.0)
MCHC: 34.2 g/dL (ref 30.0–36.0)
MCV: 103.9 fL — ABNORMAL HIGH (ref 78.0–100.0)
RBC: 3.03 MIL/uL — ABNORMAL LOW (ref 4.22–5.81)

## 2010-12-15 LAB — APTT: aPTT: 33 seconds (ref 24–37)

## 2010-12-15 LAB — DIFFERENTIAL
Basophils Relative: 0 % (ref 0–1)
Eosinophils Relative: 3 % (ref 0–5)
Monocytes Absolute: 0.8 10*3/uL (ref 0.1–1.0)
Monocytes Relative: 13 % — ABNORMAL HIGH (ref 3–12)
Neutro Abs: 4.7 10*3/uL (ref 1.7–7.7)

## 2010-12-15 LAB — POCT I-STAT, CHEM 8
BUN: 22 mg/dL (ref 6–23)
Calcium, Ion: 1.14 mmol/L (ref 1.12–1.32)
Chloride: 107 mEq/L (ref 96–112)
Creatinine, Ser: 1.3 mg/dL (ref 0.4–1.5)

## 2011-01-05 ENCOUNTER — Ambulatory Visit: Payer: Self-pay | Admitting: Infectious Disease

## 2011-01-09 ENCOUNTER — Encounter: Payer: Self-pay | Admitting: Infectious Disease

## 2011-01-09 ENCOUNTER — Ambulatory Visit (INDEPENDENT_AMBULATORY_CARE_PROVIDER_SITE_OTHER): Payer: Medicare Other | Admitting: Infectious Disease

## 2011-01-09 DIAGNOSIS — T8579XA Infection and inflammatory reaction due to other internal prosthetic devices, implants and grafts, initial encounter: Secondary | ICD-10-CM

## 2011-01-09 DIAGNOSIS — R319 Hematuria, unspecified: Secondary | ICD-10-CM | POA: Insufficient documentation

## 2011-01-09 DIAGNOSIS — I714 Abdominal aortic aneurysm, without rupture: Secondary | ICD-10-CM

## 2011-01-09 DIAGNOSIS — A4151 Sepsis due to Escherichia coli [E. coli]: Secondary | ICD-10-CM

## 2011-01-09 NOTE — Assessment & Plan Note (Signed)
Complicated by infection and bacteremia currently on lifelong suppressive levofloxacin. The patient will followup with Dr. Trula Slade

## 2011-01-09 NOTE — Assessment & Plan Note (Signed)
Since her result was likely due to his anti-coagulation

## 2011-01-09 NOTE — Assessment & Plan Note (Signed)
As above we'll continue him on lifelong levofloxacin. Would like to check a CBC and metabolic present metabolic panel from time to time. Clearly the main things are ready to do is to monitor his clinical symptoms. He'll return to clinic in one years time.

## 2011-01-09 NOTE — Assessment & Plan Note (Signed)
We will continue him on lifelong levofloxacin.

## 2011-01-09 NOTE — Progress Notes (Signed)
Subjective:    Patient ID: Gregory Hodges, male    DOB: Feb 12, 1941, 70 y.o.   MRN: [id number]  HPI 70 yo with history of CAD, Crohns disease, who underwent open aneurysm repair with an aorto-bi-external iliac graft in June 2011. The patient's postoperative course was complicated by dehiscence, which required a return trip to the operating room for abdominal wall closure. This was done on March 10, 2010. 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 He was brought to the ED with abdominal pain. He was taken urgently to the OR at [facility] by Dr. 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 The patient underwent Bilateral axillary-to-common femoral artery bypass graft with 8-mm ringed PTFE . Explant of aorto-bi-external iliac graft with closure of aortic stump. Ligation of left renal vein. Lysis of adhesions x 90 minutes, Primary repair of enterocutaneous fistul, Omental flap, Wound VAC temporary abdominal closure. Blood cultures and ntraoperative cultures yielded E coli. HE was narrowed from vancomycin and zosyn intially to rocephin and then dc on oral levaquin 53m daily.   patient returns for followup once more. He is doing well. He continues to use levofloxacin. He has gained weight he is without fevers chills or malaise. He is quite active and is playing golf and then spent time in his garden. He did have some hematuria recently and had a CT of his abdomen and pelvis with contrast was performed. This is in the chart. This was apparently reviewed with vascular surgery. He also was guided by urology. Hematuria is resolved. He has had basic lab work obtained by his primary care physician and that was origin having blood drawn today. He was upset with our scheduling system and was upset that he was unable to make a six-month upon the last visit and that he was unable  to that dated a month prior and he was rescheduled. I told him I would our clinic management. Primary Dr. SPatria Mane   Review of Systems As in history of present illness otherwise 12 point review of systems is negative.    Objective:   Physical Exam    patient is alert and oriented x4. HEENT normocephalic atraumatic. His nose though does have a bandage from a Mohs procedure. Her affect is clear.  Exam regular rate without murmurs or rubs heard lungs clear to auscultation bilaterally his surgical scar is well healed without fluctuance he does have an area of herniation the right side which is stable. 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  INF&INFLAM REACT DUE UNSPEC DEVICE IMPLANT&GRAFT As above we'll continue him on lifelong levofloxacin. Would like to check a CBC and metabolic present metabolic panel from time to time. Clearly the main things are ready to do is to monitor his clinical symptoms. He'll return to clinic in one years time.    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  INF&INFLAM REACT DUE UNSPEC DEVICE IMPLANT&GRAFT As above we'll continue him on lifelong levofloxacin. Would like to check a CBC and metabolic present metabolic panel from time to time. Clearly the main things are ready to do is to monitor his clinical symptoms. He'll return to clinic in one years time.  Hematuria Since her result was likely due  to his anti-coagulation  AAA Complicated by infection and bacteremia currently on lifelong suppressive levofloxacin. The patient will followup with Dr. Trula Slade

## 2011-02-09 ENCOUNTER — Encounter (INDEPENDENT_AMBULATORY_CARE_PROVIDER_SITE_OTHER): Payer: Medicare Other

## 2011-02-09 ENCOUNTER — Ambulatory Visit (INDEPENDENT_AMBULATORY_CARE_PROVIDER_SITE_OTHER): Payer: Medicare Other | Admitting: Surgery

## 2011-02-09 DIAGNOSIS — Z48812 Encounter for surgical aftercare following surgery on the circulatory system: Secondary | ICD-10-CM

## 2011-02-09 DIAGNOSIS — I70219 Atherosclerosis of native arteries of extremities with intermittent claudication, unspecified extremity: Secondary | ICD-10-CM

## 2011-02-09 DIAGNOSIS — I739 Peripheral vascular disease, unspecified: Secondary | ICD-10-CM

## 2011-02-10 NOTE — Assessment & Plan Note (Signed)
OFFICE VISIT   Gregory Hodges, Gregory Hodges  DOB:  12-06-1940                                       10/06/2010  [id number]#:[id number]   REASON FOR VISIT:  Followup.   HISTORY:  The patient is a 70 year old gentleman who presented on  July 22, 2010, with an infected aortic graft with contained ruptured.  His previous aneurysm repair been done at [facility].  It was complicated by  an enterocutaneous fistula and prolonged hospital stay.  He also suffers  from a dehiscence of his wound.  When I saw him, he was having back  pain.  I contacted [facility] and we elected to proceed with repair in  [place].  He underwent bilateral ax-fem bypass grafts with 8-mm  ringed PTFE (Propaten).  At the same time, I removed his graft and  oversewed his aorta.  I did remove 2 small, less than 1 cm cuffs on his  distal iliac arteries.  The patient did amazingly well and was  discharged to home approximately 10 days after his operation.  He saw  Dr. Tommy Hodges with Infectious Disease and is being maintained on lifelong  antibiotics.  At his last visit, I was able to palpate some fluid around  his graft.  However, at that time, he was not having any signs of  infection.  He comes back in today without fevers or chills.  He is  actually doing quite well.  He has started to gain some weight.  He is  still very weak but is feeling better and better each day.   PHYSICAL EXAM:  Heart rate 58, blood pressure 142/78, O2 sat 97%.  General:  Well-appearing, no distress.  His abdomen is soft and  nontender.  A few sutures are protruding from his abdomen.  He continues  to have a midline hernia which was present before his operation.  There  is still in a ballotable amount of fluid around both grafts.  There is a  more prominent bulge around the left iliac crest.  There is no redness  or warmth, or tenderness over the graft.  His extremities are warm and  well perfused.   DIAGNOSTIC STUDIES:  A CT scan was  performed today which shows perigraft  fluid without radiographic signs of infection.  ABIs are also performed  today which are normal at 1.0.   ASSESSMENT/PLAN:  Status post infected aortic graft removal and  bilateral axial-femoral bypass grafts.   Despite having fluid collections around both bypasses, he continues to  do amazingly well.  I do not think that the grafts are infected at this  time.  We certainly discussed the possibility that they could becoming  infected.  However, at this time, there are very minimal options should  the graft become infected, so we will continue to monitor these  expectantly.  The patient is supposed to see Dr. Tommy Hodges in March for  further evaluation.  I will plan on seeing him back in May.  I have  pretty much released him for full activity.     Gregory Abrahams, MD  Electronically Signed   VWB/MEDQ  D:  10/06/2010  T:  10/07/2010  Job:  [id number]   cc:   Gregory Evener, MD

## 2011-02-10 NOTE — Assessment & Plan Note (Signed)
OFFICE VISIT   PLUMMER, MATICH  DOB:  1940/12/29                                       08/18/2010  FVCBS#:[id number]   The patient comes back today for followup.  He is status post bilateral  ax-fem bypass graft and removal of infected aortic aneurysm graft and  oversewing of his infrarenal abdominal aorta.  This was done in the  setting of a contained ruptured infected aortobifemoral bypass graft  done at [facility] in June.  His initial operation was complicated by  enterotomy and dehiscence which required a prolonged hospitalization and  led to an enterocutaneous fistula which has spontaneously healed.  The  patient actually did very well from his operative standpoint and was  discharged home approximately 10 days after his operation.  He comes  back in today for followup.  His midline incision is well-healed.  He  has a hernia to the right of midline which was known at the time of his  abdominal wall closure.  He has palpable femoral and pedal pulses.  Both  ax-fem grafts seem to be surrounded by fluid.  There is no evidence of  infection.  He is not having any fevers.   The patient is being managed by Dr. Tommy Medal from an antibiotic  perspective.  I recommended that he be on lifelong antibiotics.  He is  supposed to see Dr. Tommy Medal tomorrow.   Overall I think he has come through his operation very well.  I am going  to have him come back to see me in 1 month with a CT scan of his chest,  abdomen and pelvis for baseline.  I did leave a small piece of graft on  the iliac vessels.  I also want to evaluate the fluid around his graft.     Eldridge Abrahams, MD  Electronically Signed   VWB/MEDQ  D:  08/18/2010  T:  08/19/2010  Job:  [id number]   cc:   Dr Maura Crandall

## 2011-02-10 NOTE — Assessment & Plan Note (Signed)
OFFICE VISIT  FERDINANDO, LODGE DOB:  [birth year]                                       02/09/2011 FAOZH#:[id number]  REASON FOR VISIT:  Followup.  HISTORY:  This is a 70 year old gentleman who in October 2011 presented with an infected aortic graft with contained rupture. His previous repair had been at [facility].  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  I took him emergently to the operating room and proceeded with bilateral ax-femoral bypass graft with 8 mm ring PTFE as well as oversewing his aorta and removing his infected graft.  I did leave 2 small, less than 1 cm cuffs in the distal iliac artery.  He is currently being maintained on lifelong antibiotics by Dr. Tommy Medal with infectious disease.  He has had some fluid around his graft; however, he has not had any signs of infection.  He comes back in today for a routine follow- up.  He has no complaints at this time.  PHYSICAL EXAMINATION:  Heart rate is 57, blood pressure 113/70, O2 sat 97%.  General:  He is well-appearing in no distress.  Respirations nonlabored.  Cardiovascular:  Palpable pulse within the graft and in the groin.  He has known midline hernia with multiple sutures right underneath the skin, which have not extruded.  Legs are without open wounds.  DIAGNOSTIC STUDIES:  Duplex ultrasound was performed today that shows no hemodynamically significant stenosis.  ABIs are 1 bilaterally.  There continues to be fluid around the graft.  This is an essentially unchanged study from January 2012.  ASSESSMENT/PLAN:  Status post bilateral axillary femoral grafts in the setting of infected, ruptured aortic graft.  The patient is doing very well at this time.  We will continue to keep him on a routine surveillance.  The next study will be in a year.  He will contact me if he has any questions.  Again, he has made excellent  recovery and is nearly back to his baseline.    Eldridge Abrahams, MD Electronically Signed  VWB/MEDQ  D:  02/09/2011  T:  02/10/2011  Job:  [id number]  cc:   Alcide Evener, MD

## 2011-02-13 NOTE — Assessment & Plan Note (Signed)
[place] OFFICE NOTE   NAME:Gregory Hodges, DEVANTA                         MRN:          [id number]  DATE:07/27/2006                            DOB:          08-Jan-1941    PRIMARY CARDIOLOGIST:  [facility] in [place].   This is a 70 year old white male patient who had an ST elevation MI in  November 2006, treated with Horizon study, stent to the first diagonal.  He  otherwise had nonobstructive coronary artery disease, ejection fraction 60%.  He is here for his 1-year followup of the Horizon study, at which time  repeat cardiac catheterization is to be performed, but the patient is  declining this at this time.  Overall, he denies any chest pain,  palpitations, shortness of breath, dyspnea on exertion, dizziness, or  presyncope.  He exercises at the health club 4 to 5 days a week.  He says he  does 45 minutes of cardio, but, unfortunately, he continues to smoke  cigarettes; he says only 2 to 3 a day.   CURRENT MEDICATIONS:  1. Plavix 75 mg daily.  2. Aspirin 81 mg daily.  3. Lipitor 80 mg daily.  4. Mercaptopurine 50 mg daily.  5. Lisinopril 10 mg daily.  6. Atenolol 25 mg daily.  7. Omeprazole 20 mg daily.   PHYSICAL EXAM:  This is a pleasant 70 year old white male in no acute  distress.  Blood pressure is slightly up today at 157/84, pulse 58, weight 201.  NECK:  Without JVD, HR, bruits, or thyroid enlargement.  LUNGS:  Decreased breath sounds with scattered wheezing.  Otherwise clear.  HEART:  Regular rate and rhythm at 58 beats per minute.  Normal S1 and S2.  Positive S4.  No murmur, rub, bruit, thrill, or heave noted.  ABDOMEN:  Soft without organomegaly, masses, lesions, or abnormal  tenderness.  EXTREMITIES:  Without cyanosis, clubbing, or edema.  Good distal pulses.   EKG:  Sinus bradycardia, poor R wave progression, nonspecific ST-T wave  changes.   IMPRESSION:  1. Status post ST elevation  myocardial infarction treated with stenting of      the diagonal with Horizon study stent in November 2006.  Refusing 1-      year followup cath.  2. Hypertension.  3. Hyperlipidemia.  4. Ongoing tobacco abuse.  5. Crohn's disease.   PLAN:  I have encouraged the patient to quit smoking all together.  I told  him his blood pressure is slightly up today and he prefers to have this  followed at the [facility] as well as his future cardiology followup.     ______________________________  Ermalinda Barrios, PA-C    ______________________________  Satira Sark, MD   ML/MedQ  DD: 07/27/2006  DT: 07/27/2006  Job #: [id number]

## 2011-02-13 NOTE — H&P (Signed)
Gregory Hodges NO.:  [id number]   MEDICAL RECORD NO.:  [id number]          PATIENT TYPE:  EMS   LOCATION:  MAJO                         FACILITY:  [facility]   PHYSICIAN:  [name], M.D. LHCDATE OF BIRTH:  1941/05/08   DATE OF ADMISSION:  08/11/2005  DATE OF DISCHARGE:                                HISTORY & PHYSICAL   PRIMARY CARE PHYSICIAN:  [facility] [place]:  New and will be [name], M.D.   CHIEF COMPLAINT:  Chest pain.   HISTORY OF PRESENT ILLNESS:  [name] is a 70 year old male with no known  history of coronary artery disease.  He had onset of chest pain that he  describes as a pressure at approximately noon.  He had no associated  shortness of breath, nausea, vomiting, or diaphoresis.  He has had regular  reflux symptoms which are fairly well controlled with Nexium and p.r.n.  Tums, but this was different and did not resolve with medication.  He called  the [place], spoke with the nurses there who recommended he call 911.  When he  was picked up by EMS, his EKG was indicative of an acute lateral MI, and he  was brought urgently to the hospital and to the catheterization lab.   Mr.  Gregory Hodges has never had symptoms like this before.  He is generally an  active man and plays golf on a regular basis, although he does not exercise  regularly.  He does continue to smoke.  His symptoms were decreased after  EMS gave him 4 baby aspirin and 3 sublingual nitroglycerin, but he is still  having ongoing chest pain at a 2/10.   PAST MEDICAL HISTORY:  Significant for hyperlipidemia and ongoing tobacco  use, but he has no history of diabetes, hypertension, or family history of  coronary artery disease.  He has a history of Crohn's disease.   PAST SURGICAL HISTORY:  He has had surgery on one arm, one knee, and kidney  stones removed.   ALLERGIES:  No known drug allergies.   MEDICATIONS:  1.  Captopurine 50 mg daily.  2.  Nexium 40  mg daily.  3.  Tums p.r.n.   SOCIAL HISTORY:  He lives in [place] with his wife and is retired from  being self-employed in Office manager.  He has greater than 50-pack-year  history of tobacco use but does not abuse alcohol or drugs.   FAMILY HISTORY:  Both parents died in their 38s, but neither one had heart  disease, and none of his siblings have heart disease either.   REVIEW OF SYSTEMS:  He has some vision loss and wears glasses.  He has had a  rash on his left upper extremity for 2 to 3 days.  Chest pain is described  above.  He has rare arthralgias.  He has occasional reflux symptoms.  He  denies hematemesis, hemoptysis, or melena, and there is no recent fever or  chills.  Review of Systems is otherwise negative.   PHYSICAL EXAMINATION:  GENERAL:  He is a well-developed, well-nourished  white male in no acute distress.  HEENT:  His head is normocephalic and atraumatic with pupils equal, round,  and reactive to light and accommodation.  Sclerae clear.  Nares without  discharge.  NECK:  There is no lymphadenopathy, no thyromegaly, no bruit, and no JVD  noted.  CARDIOVASCULAR:  Heart is regular rate and rhythm with S1 and S2.  No  significant murmur, rub, or gallop was noted. Distal pulses are 2+.  LUNGS:  He has a few rales in the bases.  SKIN:  An erythematous, slightly raised area about 5 cm across is noted on  his left upper extremity and extends down from the mid humerus towards the  antecubital fossa.  ABDOMEN: Soft and nontender with active bowel sounds.  EXTREMITIES:  There is no cyanosis, clubbing, or edema noted.  MUSCULOSKELETAL: No joint deformity or effusions noted.  NEUROLOGIC:  Alert and oriented.  Cranial nerves II-XII grossly intact.   LABORATORY DATA:  Chest x-ray and laboratory values are pending.   EKG is sinus rhythm with ST elevation in I and aVL as well as reciprocal  changes in III, aVF, and V4 through V6.   IMPRESSION:  [name] is a  70 year old male with a past medical history of  Crohn's disease and tobacco abuse.  He presents with an acute lateral  myocardial infarction.  He developed substernal chest pain about noon that  was described as burning and pressure and also had bilateral arm numbness  and aching.  He had no other associated symptoms.  His electrocardiogram is  sinus rhythm with lateral ST elevation and inferior depression.   PLAN:  1.  Emergent catheterization for a lateral myocardial infarction.  The risks      and benefits of the procedure were discussed with the patient, and he      agrees to proceed.  2.  He will be treated with aspirin, Plavix, Lopressor, and a statin.  3.  Smoking cessation consult will be called as well.  4.  He will be continued on his home medications for Crohn's disease and      reflux.   This is [name], P.A.-C., dictating for [name], M.D., who saw  the patient and determined the plan of care.      [name], P.A. [facility]    ______________________________  [name], M.D. [facility]    RB/MEDQ  D:  08/11/2005  T:  08/11/2005  Job:  [id number]   cc:   [facility] [place]

## 2011-02-13 NOTE — Cardiovascular Report (Signed)
[name], Hodges NO.:  [id number]   MEDICAL RECORD NO.:  [id number]          PATIENT TYPE:  OIB   LOCATION:  2928                         FACILITY:  [facility]   PHYSICIAN:  Gregory Hodges, M.D. [facility] DATE OF BIRTH:  [birth year]   DATE OF PROCEDURE:  08/11/2005  DATE OF DISCHARGE:                              CARDIAC CATHETERIZATION   PROCEDURE:  Cardiac catheterization and percutaneous coronary intervention.   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  He does have a history of previous  Crohn's disease.  He had the onset of chest pain at 12 noon and called EMS  and was taken to [facility].  His EKG was transmitted to the emergency  department and he was routed directly up to the cath lab and worked up by  Gregory Hodges and Gregory Hodges in the cath lab.   PROCEDURE:  The procedure was performed with the right femoral arterial  sheath and six free-form coronary catheters.  Frontal arterial function was  performed and non-opaque contrast was used.  The ventriculogram was done  after the angioplasty.  We used Q4 6-French guiding catheters with side-  holes and Prowater wire.  We crossed.  The patient was enrolled in the  Horizons trial and randomized to heparin.  We adjusted heparin and  Integrilin.  He was given weight-adjusted heparin to prolong the HCTZ  greater than 200 seconds.  He was also given 600 mg of Plavix.  We crossed  the lesion in the ostium of the diagonal branch of the LAD with a Prowater  wire without difficulty.  We predilated with a 2.5 x 50 mm Maverick  performing two inflations up to eight atmospheres for 30 seconds.  We then  deployed a 2.5 x 12 mm Horizon study stent performing this with one  inflation of 14 atmospheres for 30 seconds.  We postdilated the mid portion  of the stent with a 2.75 x 8 mm Quantum Maverick.  We avoided the ostium for  concern about  compromising the LAD and avoided the distal edge because there  was a sharp step-down at this point in the vessel caliber.  We then  attempted to IVUS the lesion, but the IVUS dysfunctioned and we lost her  image about halfway through the run.  At this point we recognized that there  was an ostial LAD lesion which we had previously not felt was severe.  For  this reason, we decided to IVUS the ostium of the LAD.  This showed that the  ostium of the LAD was moderately tight and what we felt was borderline  significance.   After completing the stent procedure, we performed a left ventriculogram.  The right femoral artery was not suitable for closure.  The patient  tolerated the procedure well and left the laboratory in satisfactory  condition with good results.  The aortic pressure was 129/78 with a mean of  102 and left mid pressure was 129/20.  Left main coronary artery is free of  disease.   Left anterior descending artery had a 50% stenosis at the ostium.  There was  also a large diagonal branch that had a 90% ostial stenosis.  It also gave  rise to four septal perforators.  It was irregular throughout its course,  but there were no other high-grade stenoses.   The circumflex artery was a small vessel that gave rise to a marginal branch  and posterolateral branch.  These vessels were irregular with no significant  obstruction.   The right coronary artery had a moderate-sized vessel gave rise to a conus  branch, a right ventricular branch, a posterior descending branch, and five  posterolateral branches.  The right coronary artery was irregular.  There  was 40% narrowing at the posterior descending branch and 40% narrowing  before the third posterolateral branch.   The left ventriculogram performed in RA projection showed hyperkinesis of  the anterolateral wall.  The overall wall motion was very good and the  estimated ejection fraction was 60%.   Following stenting of the lesion  in the ostium of the diagonal branch of the  LAD went from 90% to less than 10% and flow was TIMI-III before and after  intervention.  Myocardial Bless score was also TIMI-III before and after  intervention.   The IVUS measurements on the ostial LAD showed that the proximal LAD was  quite large and was a 5 mm vessel.  At the ostium of the LAD the dimensions  were about 2.2 to 2.5 in diameter.  The lesion area was 4.9 mm/sq and the  reference lumen area was 18.9 mm/sq in the LAD giving an area of stenosis of  74%.  The distal reference may have been aneurysmally dilated and therefore  we assumed a diameter of 4 rather than 5, then the area of stenosis would  have been 61%.   The patient had the onset of chest pain at 12 noon and arrived in the cath  lab at 1437.  The first balloon inflation was performed at 1507.  This gave  a door balloon time of 30 minutes and a reperfusion time of three hours and  7 minutes.   CONCLUSIONS:  1.  Acute lateral wall myocardial infarction with 50% stenosis in the ostium      of the LAD, 90% stenosis in the ostium of the first large diagonal      branch, irregularities in the circumflex artery, 40% distal stenosis in      the right coronary artery, and mild anterolateral wall hyperkinesis with      an estimated ejection fraction of 60%.  2.  Successful stenting of the ostial lesion in the diagonal branch of the      LAD using Horizon study stent with improvement with central narrowing      from 90% to less than 10%.   DISPOSITION:  The patient will return to unit for further observation.  I  would recommend evaluation with stress testing after the patient has  recovered from myocardial infarction to evaluate the significance of the  ostial LAD lesion.  This lesion would be difficult to treat percutaneously  if the patient has ischemia in this distribution.           ______________________________  Gregory Hodges, M.D. [facility]    BB/MEDQ  D:   08/11/2005  T:  08/12/2005  Job:  [id number]   cc:   Gregory Hodges, M.D. [facility]  [address] [place]  [place] [address]   Cardiopulmonary Lab

## 2011-02-13 NOTE — Procedures (Signed)
[place]  Patient:    Gregory Hodges, Gregory Hodges                         MRN: [id number] Proc. Date: 06/30/00 Adm. Date:  [id number] Attending:  Ernie Avena CC:         Anthoney Harada, M.D.   Procedure Report  PROCEDURE:  Colonoscopy.  ENDOSCOPIST:  Cleotis Nipper, M.D.  INDICATION:  [age]-year-old with severe colitis noted on recent flexible sigmoidoscopy.  Assess activity and extent of disease.  FINDINGS:  Severe left-sided colitis, see details below.  DESCRIPTION OF PROCEDURE:  The nature, purpose and risks of the procedure had been discussed with patient, who provided written consent.  Sedation was Fentanyl 100 mcg and Versed 7 mg IV, without arrhythmias or desaturation.  The Olympus adult video colonoscope was quite easily advanced to the cecum and for a moderate distance into a normal-appearing terminal ileum.  Biopsies were obtained from it and then pullback was initiated.  The quality of the prep was fair.  It was felt that areas were adequately seen and that no major lesions would have been missed.  The main findings on this exam were the complete normalcy of the terminal ileum, ascending colon and transverse colon, and the severe inflammatory changes beginning at 50 to 60 cm, corresponding roughly to the region of the left colon and rectum.  The inflammatory changes in the left colon and rectum were characterized by deep-furrowed ulcerations of various sizes, some of them quite wide and patchy (for example, 2 x 5 cm), others more narrow and slit-like.  The surrounding mucosa was somewhat erythematous, edematous and heaped up.  There was virtually complete loss of normal vascularity in the distal colon and, in fact, in the distal rectum, there was almost circumferential deep ulceration.  I did not see any discrete polyps, masses or extensive diverticulosis, although a rare left-sided diverticulum was  seen.  Multiple biopsies were obtained from the uninflamed and inflamed segments of this exam and placed in separate jars.  The patient tolerated the procedure well and there were no apparent complications.  IMPRESSION:  Severe left-sided colitis.  COMMENT:  The endoscopic picture would seem to be compatible with Crohns colitis, although the distal occurrence and rectal involvement would be more characteristic of ulcerative colitis; however, the deep furrowed ulcerations and relative absence of confluency of the ulcerative changes makes me favor Crohns colitis over ulcerative colitis.  PLAN:  Await pathology on todays biopsies. DD:  06/30/00 TD:  07/01/00 Job: [id number]

## 2011-02-13 NOTE — Discharge Summary (Signed)
Gregory Hodges, Gregory Hodges                ACCOUNT NO.:  [id number]   MEDICAL RECORD NO.:  [id number]          PATIENT TYPE:  INP   LOCATION:  5188                         FACILITY:  [facility]   PHYSICIAN:  [name], M.D. LHCDATE OF BIRTH:  June 28, 1941   DATE OF ADMISSION:  08/11/2005  DATE OF DISCHARGE:  08/13/2005                                 DISCHARGE SUMMARY   PRIMARY DIAGNOSIS:  Acute lateral myocardial infarction.   SECONDARY DIAGNOSES:  1.  Hyperlipidemia.  2.  Tobacco use.  3.  History of Crohn's disease.  4.  Gastroesophageal reflux disease symptoms.  5.  Insomnia.  6.  Mildly decreased TSH with normal T3 and T4, follow-up as outpatient.  7.  Dyslipidemia with total cholesterol of 173, triglycerides 111, HDL 39,      LDL 112.   PROCEDURES:  1.  Cardiac catheterization.  2.  Coronary arteriogram.  3.  Left ventriculogram.  4.  PTCA and stent to the diagonal.   HOSPITAL COURSE:  Gregory Hodges is a 70 year old male with no known history of  coronary artery disease.  He had onset of chest pain described as some  pressure approximately noon on the day of admission.  He had no associated  symptoms. He has never had symptoms like this before but he did not come  because he thought it was reflux.  His symptoms did not resolve and he  called the [facility] who manages his general medical care.  They recommended he  call 911, which he did, and he was transported by EMS to [facility]. [facility], he had baby aspirin x4 and sublingual  nitroglycerin x3.  His symptoms decreased but were still present.  EKG was  indicative of an acute lateral MI and he was taken urgently to the  catheterization lab.   The cardiac catheterization showed 90% diagonal which was felt to be the  culprit lesion and this was treated __________ Crohn's disease.   Surgical with PTCA and a HORIZON study stent reducing the stenosis to 0.  He  had nonobstructive disease at 50% in the LAD and 40% in  the distal RCA.  He  had some lateral hypokinesis with an EF of 60%.   Gregory Hodges tolerated the procedure well and the sheath was removed without  difficulty.  His peak CK-MB was 2099/371 with a peak troponin of 7.37.  Gregory Hodges recovered over the next two days and was seen by cardiac rehab as well  as smoking cessation.  It was recommended that he stop tobacco and he was  going to quit cold [place].  He was seen by cardiac rehab and educated as  well as ambulated.  He would follow up with them as an outpatient.  As part  of his evaluation, a TSH was checked which was low at 0.339.  A free T4 was  within normal limits at 1.03 and a free T3 was within normal limits at 2.5.  CRP was low at 0.2.  Dr. [name] recommended he have a TSH rechecked and if  it remains  low, he should be referred to endocrine, but nothing further  needs to be done now.  A chest x-ray was also performed which showed clear  lungs, normal heart size and a hiatal hernia.   By August 13, 2005, Gregory Hodges was ambulating without chest pain or  shortness of breath.  He was evaluated by Dr. [name] and considered stable  for discharge with outpatient follow-up arranged.   DISCHARGE INSTRUCTIONS:  His activity level is to be per rehab guidelines.  He is to call our office for problems with the catheterization site. He is  to follow up with outpatient cardiac rehab and with Dr. [name].  He is to  follow up at the [place] in [place], [place], as scheduled.   DISCHARGE MEDICATIONS:  1.  Mercaptopurine __________ mg daily.  2.  Altace 5 mg daily.  3.  Plavix 75 mg daily.  4.  Aspirin 325 mg daily.  5.  Lipitor 80 mg daily.  6.  Nexium 40 mg daily.  7.  Toprol XL 50 mg daily.      [name], P.A. [facility]    ______________________________  [name], M.D. [facility]    RB/MEDQ  D:  08/13/2005  T:  08/14/2005  Job:  [id number]   cc:   Kathalene Frames, [place]

## 2011-02-18 NOTE — Procedures (Unsigned)
BYPASS GRAFT EVALUATION  INDICATION:  Bilateral axillary to common femoral artery bypass graft followup.  HISTORY: Diabetes:  No. Cardiac:  Stents. Hypertension:  Currently medicated. Smoking:  Previous. Previous Surgery:  Bilateral axillary to common femoral artery bypass graft on 07/22/2010.  SINGLE LEVEL ARTERIAL EXAM                              RIGHT              LEFT Brachial:                    126                123 Anterior tibial:             126                114 Posterior tibial:            123                129 Peroneal: Ankle/brachial index:        1.0                1.02  PREVIOUS ABI:  Date: 10/06/2010  RIGHT:  1.02  LEFT:  1.10  LOWER EXTREMITY BYPASS GRAFT DUPLEX EXAM:  DUPLEX: 1. Nonhemodynamically significant plaque noted in the bilateral     superficial femoral arteries and popliteal arteries. 2. Anechoic area not vascular in nature surrounding bilateral axillary     to common femoral artery bypass grafts, the majority of the length     of the graft.  IMPRESSION: 1. Patent bilateral axillary to common femoral artery bypass grafts. 2. Bilateral ankle brachial indices are >0.95  and considered normal. 3. Essentially unchanged ankle brachial indices since study on     10/06/2010.  ___________________________________________ V. Leia Alf, MD  SH/MEDQ  D:  02/09/2011  T:  02/09/2011  Job:  [id number]

## 2012-01-13 ENCOUNTER — Encounter: Payer: Self-pay | Admitting: Infectious Disease

## 2012-01-13 ENCOUNTER — Ambulatory Visit (INDEPENDENT_AMBULATORY_CARE_PROVIDER_SITE_OTHER): Payer: Medicare Other | Admitting: Infectious Disease

## 2012-01-13 VITALS — BP 157/78 | HR 64 | Temp 98.3°F | Ht 69.0 in | Wt 188.0 lb

## 2012-01-13 DIAGNOSIS — T07XXXA Unspecified multiple injuries, initial encounter: Secondary | ICD-10-CM

## 2012-01-13 DIAGNOSIS — T8579XA Infection and inflammatory reaction due to other internal prosthetic devices, implants and grafts, initial encounter: Secondary | ICD-10-CM

## 2012-01-13 DIAGNOSIS — A4151 Sepsis due to Escherichia coli [E. coli]: Secondary | ICD-10-CM

## 2012-01-13 DIAGNOSIS — T148XXA Other injury of unspecified body region, initial encounter: Secondary | ICD-10-CM

## 2012-01-13 NOTE — Assessment & Plan Note (Signed)
Continue liflong suppressive antibiotics. Will continue levaquin for now but another alternative would be TMP/SMX (less risk for C diff)

## 2012-01-13 NOTE — Assessment & Plan Note (Signed)
Chronic drainage managed with topical agents by PCP. ? Need some debridement of this area. ? 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

## 2012-01-13 NOTE — Assessment & Plan Note (Signed)
See above, lifelong suppressive abx

## 2012-01-13 NOTE — Progress Notes (Signed)
Subjective:    Patient ID: Gregory Hodges, male    DOB: 1941/08/11, 71 y.o.   MRN: [id number]  HPI  71 yo with history of CAD, Crohns disease, who underwent open aneurysm repair with an aorto-bi-external iliac graft in June 2011. The patient's postoperative course was complicated by dehiscence, which required a return trip to the operating room for abdominal wall closure. This was done on March 10, 2010. 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 He was brought to the ED with abdominal pain. He was taken urgently to the OR at [facility] by Dr. 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 The patient underwent Bilateral axillary-to-common femoral artery bypass graft with 8-mm ringed PTFE . Explant of aorto-bi-external iliac graft with closure of aortic stump. Ligation of left renal vein. Lysis of adhesions x 90 minutes, Primary repair of enterocutaneous fistul, Omental flap, Wound VAC temporary abdominal closure. Blood cultures and ntraoperative cultures yielded E coli. HE was narrowed from vancomycin and zosyn intially to rocephin and then dc on oral levaquin 518m daily. He has continued on this since then. He has had no recurrence of fevers, chills nausea. He does have some chronic discharge from superior aspect of the wound.   Review of Systems  Constitutional: Negative for fever, chills, diaphoresis, activity change, appetite change, fatigue and unexpected weight change.  HENT: Negative for congestion, sore throat, rhinorrhea, sneezing, trouble swallowing and sinus pressure.   Eyes: Negative for photophobia and visual disturbance.  Respiratory: Negative for cough, chest tightness, shortness of breath, wheezing and stridor.   Cardiovascular: Negative for chest pain, palpitations and leg swelling.  Gastrointestinal: Negative for nausea, vomiting, abdominal  pain, diarrhea, constipation, blood in stool, abdominal distention and anal bleeding.  Genitourinary: Negative for dysuria, hematuria, flank pain and difficulty urinating.  Musculoskeletal: Negative for myalgias, back pain, joint swelling, arthralgias and gait problem.  Skin: Positive for wound. Negative for color change, pallor and rash.  Neurological: Negative for dizziness, tremors, weakness and light-headedness.  Hematological: Negative for adenopathy. Does not bruise/bleed easily.  Psychiatric/Behavioral: Negative for behavioral problems, confusion, sleep disturbance, dysphoric mood, decreased concentration and agitation.       Objective:   Physical Exam  Constitutional: He is oriented to person, place, and time. He appears well-developed and well-nourished. No distress.  HENT:  Head: Normocephalic and atraumatic.  Mouth/Throat: Oropharynx is clear and moist. No oropharyngeal exudate.  Eyes: Conjunctivae and EOM are normal. Pupils are equal, round, and reactive to light. No scleral icterus.  Neck: Normal range of motion. Neck supple. No JVD present.  Cardiovascular: Normal rate, regular rhythm and normal heart sounds.  Exam reveals no gallop and no friction rub.   No murmur heard. Pulmonary/Chest: Effort normal and breath sounds normal. No respiratory distress. He has no wheezes. He has no rales. He exhibits no tenderness.  Abdominal: He exhibits no distension and no mass. There is no tenderness. There is no rebound and no guarding.  Musculoskeletal: He exhibits no edema and no tenderness.  Lymphadenopathy:    He has no cervical adenopathy.  Neurological: He is alert and oriented to person, place, and time. He has normal reflexes. He exhibits normal muscle tone. Coordination normal.  Skin: Skin is warm and dry. He is not diaphoretic. No erythema. No pallor.     Psychiatric: He has a normal mood and affect. His behavior is normal.  Judgment and thought content normal.            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 Will continue levaquin for now but another alternative would be TMP/SMX (less risk for C diff)  INF&INFLAM REACT DUE UNSPEC DEVICE IMPLANT&GRAFT See above, lifelong suppressive abx  Wound drainage Chronic drainage managed with topical agents by PCP. ? Need some debridement of this area. ? 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

## 2012-01-21 ENCOUNTER — Other Ambulatory Visit: Payer: Self-pay | Admitting: Dermatology

## 2012-02-15 ENCOUNTER — Ambulatory Visit: Payer: Self-pay | Admitting: Surgery

## 2012-03-18 ENCOUNTER — Encounter: Payer: Self-pay | Admitting: Neurosurgery

## 2012-03-21 ENCOUNTER — Ambulatory Visit (INDEPENDENT_AMBULATORY_CARE_PROVIDER_SITE_OTHER): Payer: Medicare Other | Admitting: Surgery

## 2012-03-21 ENCOUNTER — Encounter (INDEPENDENT_AMBULATORY_CARE_PROVIDER_SITE_OTHER): Payer: Medicare Other | Admitting: *Deleted

## 2012-03-21 ENCOUNTER — Ambulatory Visit (INDEPENDENT_AMBULATORY_CARE_PROVIDER_SITE_OTHER): Payer: Medicare Other | Admitting: *Deleted

## 2012-03-21 ENCOUNTER — Encounter: Payer: Self-pay | Admitting: Neurosurgery

## 2012-03-21 VITALS — BP 120/69 | HR 54 | Resp 18 | Ht 69.0 in | Wt 182.6 lb

## 2012-03-21 DIAGNOSIS — Z48812 Encounter for surgical aftercare following surgery on the circulatory system: Secondary | ICD-10-CM

## 2012-03-21 DIAGNOSIS — I739 Peripheral vascular disease, unspecified: Secondary | ICD-10-CM

## 2012-03-21 DIAGNOSIS — I70219 Atherosclerosis of native arteries of extremities with intermittent claudication, unspecified extremity: Secondary | ICD-10-CM | POA: Insufficient documentation

## 2012-03-21 NOTE — Progress Notes (Signed)
[facility] [place]   Patient name: Gregory Hodges MRN: [id number] DOB: [birth year] Sex: male     Chief Complaint  Patient presents with  . PVD    f/up with lab work- pain in legs with walking.    HISTORY OF PRESENT ILLNESS: The patient is back today for followup. In 2011, October, he presented with an infected aortic graft with contained rupture. His previous repair was done at [facility]. 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 I took him emergently to the operating room and proceeded with bilateral axillary femoral bypass graft with 8 mm ring PTFE. I removed his infected graft and oversewed his aorta. I had to leave a small cuff on the distal iliac artery because it was well incorporated. I have recommended lifelong antibiotics. 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 He reports no complaints today. He has had a small area of drainage in the superior aspect of his incision which has resolved.    Past Medical History  Diagnosis Date  . Peripheral vascular disease June 2011    No past surgical history on file.  History   Social History  . Marital Status: Married    Spouse Name: N/A    Number of Children: N/A  . Years of Education: N/A   Occupational History  . Not on file.   Social History Main Topics  . Smoking status: Former Research scientist (life sciences)  . Smokeless tobacco: Not on file  . Alcohol Use: Not on file  . Drug Use: Not on file  . Sexually Active: Not on file   Other Topics Concern  . Not on file   Social History Narrative  . No narrative on file    No family history on file.  Allergies as of 03/21/2012  . (No Known Allergies)    Current Outpatient Prescriptions on File Prior to Visit  Medication Sig Dispense Refill  . aspirin 81 MG tablet Take 81 mg by mouth daily.        Marland Kitchen atenolol (TENORMIN) 25 MG tablet Take 25 mg by mouth daily.          Marland Kitchen docusate sodium (COLACE) 50 MG capsule Take by mouth 2 (two) times daily. At bedtime       . levofloxacin (LEVAQUIN) 500 MG tablet Take 500 mg by mouth daily.        Marland Kitchen lisinopril (PRINIVIL,ZESTRIL) 40 MG tablet Take 40 mg by mouth daily.        . mercaptopurine (PURINETHOL) 50 MG tablet Take 50 mg by mouth daily. Give on an empty stomach 1 hour before or 2 hours after meals. Caution: Chemotherapy.       Marland Kitchen omeprazole (PRILOSEC) 20 MG capsule Take 20 mg by mouth daily.        . simvastatin (ZOCOR) 80 MG tablet Take 80 mg by mouth at bedtime.           REVIEW OF SYSTEMS: Positive for productive cough and pain in his legs with walking.  PHYSICAL EXAMINATION:   Vital signs are BP 120/69  Pulse 54  Resp 18  Ht 5' 9"  (1.753 m)  Wt 182 lb 9.6 oz (82.827 kg)  BMI 26.97 kg/m2  SpO2 99% General: The patient appears their stated age. HEENT:  No gross abnormalities Pulmonary:  Non labored breathing Abdomen: Soft and non-tender multiple midline incisional hernias Musculoskeletal: There are no major deformities.  Neurologic: No focal weakness or paresthesias are detected, Skin: There are no ulcer or rashes noted. Psychiatric: The patient has normal affect. Cardiovascular: Palpable femoral pulse   Diagnostic Studies I have ordered and reviewed his ultrasound. This shows a widely patent axillary femoral bypass graft bilaterally. ABIs are 1.0 on the right and 1.2 on the left with both had a biphasic waveforms  Assessment: Status post removal of an infected aortic graft and bilateral axillary femoral bypass graft Plan: The patient remained stable. He was placed on routine surveillance ultrasound. Next that he will be in 6 months. He'll continue his lifelong antibiotics. I did cauterize the area that had drained a little bit and the superior aspect of his wound. We discussed the possibility of see general surgery for hernia repair. At this time he does not wish to go have this evaluated.  Eldridge Abrahams, M.D. [facility] [place] Office: [phone] Pager:  [phone]

## 2012-03-28 NOTE — Procedures (Unsigned)
BYPASS GRAFT EVALUATION  INDICATION:  Bilateral axillary to common femoral artery bypass grafts.  HISTORY: Diabetes:  No Cardiac:  Stents Hypertension:  Yes Smoking:  Previous Previous Surgery:  Bilateral axillary to common femoral artery bypass grafts performed 07/22/2010  SINGLE LEVEL ARTERIAL EXAM                              RIGHT              LEFT Brachial: Anterior tibial: Posterior tibial: Peroneal: Ankle/brachial index:        1.13               1.21  PREVIOUS ABI:  Date:  02/09/2011  RIGHT:  1.0  LEFT:  1.02  LOWER EXTREMITY BYPASS GRAFT DUPLEX EXAM:  DUPLEX:  No hemodynamically significant plaque noted in the bilateral superficial femoral arteries and popliteal arteries. Patent bilateral axillary to common femoral artery bypass grafts.  IMPRESSION: 1. Patent bilateral axillary to common femoral artery bypass grafts. 2. Bilateral ankle brachial indices are within normal limits. 3. Please see the following diagram for exam details.  ___________________________________________ V. Leia Alf, MD  EM/MEDQ  D:  03/21/2012  T:  03/21/2012  Job:  [id number]

## 2012-09-14 ENCOUNTER — Other Ambulatory Visit: Payer: Self-pay | Admitting: *Deleted

## 2012-09-14 DIAGNOSIS — Z48812 Encounter for surgical aftercare following surgery on the circulatory system: Secondary | ICD-10-CM

## 2012-09-14 DIAGNOSIS — I739 Peripheral vascular disease, unspecified: Secondary | ICD-10-CM

## 2012-09-16 ENCOUNTER — Encounter: Payer: Self-pay | Admitting: Surgery

## 2012-09-19 ENCOUNTER — Ambulatory Visit (INDEPENDENT_AMBULATORY_CARE_PROVIDER_SITE_OTHER): Payer: Medicare Other | Admitting: Surgery

## 2012-09-19 ENCOUNTER — Encounter: Payer: Self-pay | Admitting: Surgery

## 2012-09-19 ENCOUNTER — Encounter (INDEPENDENT_AMBULATORY_CARE_PROVIDER_SITE_OTHER): Payer: Medicare Other | Admitting: *Deleted

## 2012-09-19 VITALS — BP 161/81 | HR 62 | Ht 69.0 in | Wt 191.1 lb

## 2012-09-19 DIAGNOSIS — I70219 Atherosclerosis of native arteries of extremities with intermittent claudication, unspecified extremity: Secondary | ICD-10-CM

## 2012-09-19 DIAGNOSIS — Z48812 Encounter for surgical aftercare following surgery on the circulatory system: Secondary | ICD-10-CM

## 2012-09-19 DIAGNOSIS — B372 Candidiasis of skin and nail: Secondary | ICD-10-CM

## 2012-09-19 DIAGNOSIS — I739 Peripheral vascular disease, unspecified: Secondary | ICD-10-CM

## 2012-09-19 MED ORDER — NYSTATIN 100000 UNIT/GM EX POWD: Freq: Four times a day (QID) | CUTANEOUS | Status: DC

## 2012-09-19 NOTE — Progress Notes (Signed)
[facility] [place]   Patient name: Gregory Hodges MRN: [id number] DOB: Oct 07, 1940 Sex: male     Chief Complaint  Patient presents with  . PVD    6 month f/u     HISTORY OF PRESENT ILLNESS: Eldridge Abrahams, MD 03/21/2012 2:06 PM Signed  [facility] [place]  Patient name: Gregory Hodges MRN: [id number] DOB: Jul 20, 1941 Sex: male  Chief Complaint   Patient presents with   .  PVD     f/up with lab work- pain in legs with walking.    HISTORY OF PRESENT ILLNESS:  The patient is back today for followup. In 2011, October, he presented with an infected aortic graft with contained rupture. His previous repair was done at [facility]. 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 I took him emergently to the operating room and proceeded with bilateral axillary femoral bypass graft with 8 mm ring PTFE. I removed his infected graft and oversewed his aorta. I had to leave a small cuff on the distal iliac artery because it was well incorporated. I have recommended lifelong antibiotics. 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 He reports no complaints today. He has had a small area of drainage in the superior aspect of his incision .   Past Medical History  Diagnosis Date  . Peripheral vascular disease June 2011  . Myocardial infarction 2006  . AAA (abdominal aortic aneurysm)   . Hypertension     Past Surgical History  Procedure Date  . Axillary-femoral bypass graft 07/22/2010    bilateral  . Bowel     herniated bowel  . Abdominal aortic aneurysm repair 03/03/2010  . Coronary stent placement     History   Social History  . Marital Status: Married    Spouse Name: N/A    Number of Children: N/A  . Years of Education: N/A   Occupational History  . Not on file.   Social History Main Topics  . Smoking status: Current Every Day Smoker -- 1.0  packs/day    Types: Cigarettes  . Smokeless tobacco: Never Used  . Alcohol Use: 0.0 - 0.5 oz/week    0-1 drink(s) per week  . Drug Use: No  . Sexually Active: Not on file   Other Topics Concern  . Not on file   Social History Narrative  . No narrative on file    No family history on file.  Allergies as of 09/19/2012 - Review Complete 09/19/2012  Allergen Reaction Noted  . Oxycodone  09/19/2012    Current Outpatient Prescriptions on File Prior to Visit  Medication Sig Dispense Refill  . aspirin 81 MG tablet Take 81 mg by mouth daily.        Marland Kitchen atenolol (TENORMIN) 25 MG tablet Take 25 mg by mouth daily.        Marland Kitchen docusate sodium (COLACE) 50 MG capsule Take by mouth 2 (two) times daily. At bedtime       . lisinopril (PRINIVIL,ZESTRIL) 40 MG tablet Take 40 mg by mouth daily.        . mercaptopurine (PURINETHOL) 50 MG tablet Take 50 mg by mouth daily. Give on an empty stomach 1 hour before or 2 hours after meals. Caution: Chemotherapy.       Marland Kitchen omeprazole (PRILOSEC) 20 MG capsule Take 20 mg by mouth daily.        Marland Kitchen  simvastatin (ZOCOR) 80 MG tablet Take 80 mg by mouth at bedtime.        Marland Kitchen levofloxacin (LEVAQUIN) 500 MG tablet Take 500 mg by mouth daily.        Marland Kitchen sulfamethoxazole-trimethoprim (BACTRIM DS) 800-160 MG per tablet Take 800 mg by mouth as needed. 800-160 mg         REVIEW OF SYSTEMS: No changes from the prior visit  PHYSICAL EXAMINATION:   Vital signs are BP 161/81  Pulse 62  Ht 5' 9"  (1.753 m)  Wt 191 lb 1.6 oz (86.682 kg)  BMI 28.22 kg/m2  SpO2 96% General: The patient appears their stated age. HEENT:  No gross abnormalities Pulmonary:  Non labored breathing Abdomen: Soft and non-tender easily reducible large ventral hernias. Small area of drainage at the superior aspect of the incision with surrounding inflammatory tissue Musculoskeletal: There are no major deformities. Neurologic: No focal weakness or paresthesias are detected, Skin: There are no ulcer or  rashes noted. Psychiatric: The patient has normal affect. Cardiovascular: There is a regular rate and rhythm without significant murmur appreciated.   Diagnostic Studies ABIs were ordered and performed today they were in triphasic 1.0 bilaterally  Assessment: Status post removal of aortic graft and bilateral axillary to femoral bypass grafting Plan: The patient continues to do very well. He will remain on lifelong antibiotics. He and he did have a yeast infection in the left groin which have given him a prescription for nystatin powder. The area of drainage in the superior aspect of the wound, I will leave alone as it intermittently resurfaces. The other option would be surgical exploration which I would not recommend at this time.  Eldridge Abrahams, M.D. [facility] [place] Office: [phone] Pager:  [phone]

## 2012-09-19 NOTE — Addendum Note (Signed)
Addended by: Sharee Pimple on: 09/19/2012 02:01 PM   Modules accepted: Orders

## 2012-09-26 ENCOUNTER — Ambulatory Visit: Payer: Medicare Other | Admitting: Surgery

## 2012-10-19 ENCOUNTER — Other Ambulatory Visit: Payer: Self-pay | Admitting: Dermatology

## 2013-01-26 ENCOUNTER — Ambulatory Visit (INDEPENDENT_AMBULATORY_CARE_PROVIDER_SITE_OTHER): Payer: Medicare Other | Admitting: Infectious Disease

## 2013-01-26 ENCOUNTER — Encounter: Payer: Self-pay | Admitting: Infectious Disease

## 2013-01-26 VITALS — BP 161/82 | HR 73 | Temp 98.0°F | Ht 69.0 in | Wt 190.4 lb

## 2013-01-26 DIAGNOSIS — R7881 Bacteremia: Secondary | ICD-10-CM

## 2013-01-26 DIAGNOSIS — Z5189 Encounter for other specified aftercare: Secondary | ICD-10-CM

## 2013-01-26 DIAGNOSIS — T827XXD Infection and inflammatory reaction due to other cardiac and vascular devices, implants and grafts, subsequent encounter: Secondary | ICD-10-CM

## 2013-01-26 DIAGNOSIS — B49 Unspecified mycosis: Secondary | ICD-10-CM

## 2013-01-26 DIAGNOSIS — B379 Candidiasis, unspecified: Secondary | ICD-10-CM | POA: Insufficient documentation

## 2013-01-26 DIAGNOSIS — A498 Other bacterial infections of unspecified site: Secondary | ICD-10-CM

## 2013-01-26 NOTE — Progress Notes (Signed)
Subjective:    Patient ID: Gregory Hodges, male    DOB: Aug 24, 1941, 72 y.o.   MRN: [id number]  HPI   71yo with history of CAD, Crohns disease, who underwent open aneurysm repair with an aorto-bi-external iliac graft in June 2011. The patient's postoperative course was complicated by dehiscence, which required a return trip to the operating room for abdominal wall closure. This was done on March 10, 2010. 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 He was brought to the ED with abdominal pain. He was taken urgently to the OR at [facility] by Dr. 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 The patient underwent Bilateral axillary-to-common femoral artery bypass graft with 8-mm ringed PTFE . Explant of aorto-bi-external iliac graft with closure of aortic stump. Ligation of left renal vein. Lysis of adhesions x 90 minutes, Primary repair of enterocutaneous fistul, Omental flap, Wound VAC temporary abdominal closure. Blood cultures and ntraoperative cultures yielded E coli. HE was narrowed from vancomycin and zosyn intially to rocephin and then dc on oral levaquin 500mg  daily. He has continued on this since then. He has had no recurrence of fevers, chills nausea. He  Previously had discharge from superior aspect of the wound but this had resolved  He returns to me for yearly followup. 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   Review of Systems  Constitutional: Negative for fever, chills, diaphoresis, activity change, appetite change, fatigue and unexpected weight change.  HENT: Negative for congestion, sore throat, rhinorrhea, sneezing, trouble swallowing and sinus pressure.     Eyes: Negative for photophobia and visual disturbance.  Respiratory: Negative for cough, chest tightness, shortness of breath, wheezing and stridor.   Cardiovascular: Negative for chest pain, palpitations and leg swelling.  Gastrointestinal: Negative for nausea, vomiting, abdominal pain, diarrhea, constipation, blood in stool, abdominal distention and anal bleeding.  Genitourinary: Negative for dysuria, hematuria, flank pain and difficulty urinating.  Musculoskeletal: Negative for myalgias, back pain, joint swelling, arthralgias and gait problem.  Skin: Positive for color change and wound. Negative for pallor and rash.  Neurological: Negative for dizziness, tremors, weakness and light-headedness.  Hematological: Negative for adenopathy. Does not bruise/bleed easily.  Psychiatric/Behavioral: Negative for behavioral problems, confusion, sleep disturbance, dysphoric mood, decreased concentration and agitation.       Objective:   Physical Exam  Constitutional: He is oriented to person, place, and time. He appears well-developed and well-nourished. No distress.  HENT:  Head: Normocephalic and atraumatic.  Mouth/Throat: Oropharynx is clear and moist. No oropharyngeal exudate.  Eyes: Conjunctivae and EOM are normal. Pupils are equal, round, and reactive to light. No scleral icterus.  Neck: Normal range of motion. Neck supple. No JVD present.  Cardiovascular: Normal rate, regular rhythm and normal heart sounds.  Exam reveals no gallop and no friction rub.   No murmur heard. Pulmonary/Chest: Effort normal and breath sounds normal. No respiratory distress. He has no wheezes. He has no rales. He exhibits no tenderness.  Abdominal: He exhibits no distension and no mass. There is no tenderness. There is no rebound and no guarding.  Musculoskeletal: He exhibits no edema and no tenderness.  Lymphadenopathy:    He has no cervical  adenopathy.  Neurological: He is alert and oriented to person, place, and  time. He has normal reflexes. He exhibits normal muscle tone. Coordination normal.  Skin: Skin is warm and dry. He is not diaphoretic. No erythema. No pallor.     Psychiatric: He has a normal mood and affect. His behavior is normal. Judgment and thought content normal.          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 Could try fluconaazole but would need different statin or statin holiday due to risk of Rhabdo on this contraindicated combo. Also risk of QT prolongation may be additive with FQ

## 2013-01-26 NOTE — Patient Instructions (Addendum)
I would try OTC   Clotrimazole (lotrimin)

## 2013-02-15 ENCOUNTER — Telehealth: Payer: Self-pay | Admitting: *Deleted

## 2013-02-15 NOTE — Telephone Encounter (Signed)
Was considering switch to fluconazole but concerned about side effects and having to change statins. He has a follow up with a dermatologist and is going to have a biopsy done to see if this would help in deciding on treatment. Gregory Hodges

## 2013-08-02 ENCOUNTER — Other Ambulatory Visit: Payer: Self-pay | Admitting: Dermatology

## 2013-09-25 ENCOUNTER — Ambulatory Visit: Payer: Medicare Other | Admitting: Surgery

## 2013-09-25 ENCOUNTER — Ambulatory Visit: Payer: Medicare Other | Admitting: Family

## 2013-09-25 ENCOUNTER — Encounter (HOSPITAL_COMMUNITY): Payer: Medicare Other

## 2013-10-06 ENCOUNTER — Encounter: Payer: Self-pay | Admitting: Surgery

## 2013-10-09 ENCOUNTER — Ambulatory Visit (INDEPENDENT_AMBULATORY_CARE_PROVIDER_SITE_OTHER): Payer: Medicare Other | Admitting: Surgery

## 2013-10-09 ENCOUNTER — Encounter: Payer: Self-pay | Admitting: Surgery

## 2013-10-09 ENCOUNTER — Ambulatory Visit (HOSPITAL_COMMUNITY)
Admission: RE | Admit: 2013-10-09 | Discharge: 2013-10-09 | Disposition: A | Discharge status: Home / Self Care | Payer: Medicare Other | Source: Ambulatory Visit | Attending: Surgery | Admitting: Surgery

## 2013-10-09 VITALS — BP 135/71 | HR 67 | Ht 69.0 in | Wt 197.0 lb

## 2013-10-09 DIAGNOSIS — I739 Peripheral vascular disease, unspecified: Secondary | ICD-10-CM | POA: Insufficient documentation

## 2013-10-09 DIAGNOSIS — I70219 Atherosclerosis of native arteries of extremities with intermittent claudication, unspecified extremity: Secondary | ICD-10-CM

## 2013-10-09 DIAGNOSIS — Z48812 Encounter for surgical aftercare following surgery on the circulatory system: Secondary | ICD-10-CM

## 2013-10-09 NOTE — Progress Notes (Signed)
Patient name: Gregory Hodges MRN: [id number] DOB: 1941-02-16 Sex: male     Chief Complaint  Patient presents with  . Re-evaluation    1 year f/u - pt has no complaints    HISTORY OF PRESENT ILLNESS: The patient is back today for followup. In 2011, October, he presented with an infected aortic graft with contained rupture. His previous repair was done at [facility]. 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 I took him emergently to the operating room and proceeded with bilateral axillary femoral bypass graft with 8 mm ring PTFE. I removed his infected graft and oversewed his aorta. I had to leave a small cuff on the distal iliac artery because it was well incorporated. I have recommended lifelong antibiotics. 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 He reports no complaints today. He has had a small area of drainage in the superior aspect of his incision .   Past Medical History  Diagnosis Date  . Peripheral vascular disease June 2011  . Myocardial infarction 2006  . AAA (abdominal aortic aneurysm)   . Hypertension     Past Surgical History  Procedure Laterality Date  . Axillary-femoral bypass graft  07/22/2010    bilateral  . Bowel      herniated bowel  . Abdominal aortic aneurysm repair  03/03/2010  . Coronary stent placement      History   Social History  . Marital Status: Married    Spouse Name: N/A    Number of Children: N/A  . Years of Education: N/A   Occupational History  . Not on file.   Social History Main Topics  . Smoking status: Current Every Day Smoker -- 1.00 packs/day for 30 years    Types: Cigarettes  . Smokeless tobacco: Never Used  . Alcohol Use: 0 - .5 oz/week    0-1 drink(s) per week  . Drug Use: No  . Sexual Activity: Not on file   Other Topics Concern  . Not on file   Social History Narrative  . No narrative on file    No family  history on file.  Allergies as of 10/09/2013 - Review Complete 10/09/2013  Allergen Reaction Noted  . Oxycodone  09/19/2012    Current Outpatient Prescriptions on File Prior to Visit  Medication Sig Dispense Refill  . aspirin 81 MG tablet Take 81 mg by mouth daily.        Marland Kitchen atenolol (TENORMIN) 25 MG tablet Take 25 mg by mouth daily.        Marland Kitchen levofloxacin (LEVAQUIN) 500 MG tablet Take 500 mg by mouth daily.        Marland Kitchen lisinopril (PRINIVIL,ZESTRIL) 40 MG tablet Take 20 mg by mouth daily.       Marland Kitchen omeprazole (PRILOSEC) 20 MG capsule Take 20 mg by mouth daily.       . simvastatin (ZOCOR) 80 MG tablet Take 40 mg by mouth at bedtime.       . docusate sodium (COLACE) 50 MG capsule Take by mouth 2 (two) times daily. At bedtime       . mercaptopurine (PURINETHOL) 50 MG tablet Take 50 mg by mouth daily. Give on an empty stomach 1 hour before or 2 hours after meals. Caution: Chemotherapy.       . nystatin (MYCOSTATIN) powder Apply topically 4 (four) times daily.  30 g  0  .  sulfamethoxazole-trimethoprim (BACTRIM DS) 800-160 MG per tablet Take 800 mg by mouth as needed. 800-160 mg       No current facility-administered medications on file prior to visit.     REVIEW OF SYSTEMS: No changes from previous the  PHYSICAL EXAMINATION:   Vital signs are BP 135/71  Pulse 67  Ht 5' 9"  (1.753 m)  Wt 197 lb (89.359 kg)  BMI 29.08 kg/m2  SpO2 96% General: The patient appears their stated age. HEENT:  No gross abnormalities Pulmonary:  Non labored breathing Abdomen: Soft and non-tender.  Midline incisional hernia which is reducible.  There is an area at the apex of the incision it continues to drain but this is unchanged. Musculoskeletal: There are no major deformities. Neurologic: No focal weakness or paresthesias are detected, Skin: Yeast infection in left and right groin, left greater than rightal affect. Cardiovascular: There is a regular rate and rhythm without significant murmur appreciated.   Palpable pulse within bilateral axillary femoral bypass grafts   Diagnostic Studies have reviewed his ultrasound study today.  There have been no significant changes in his ankle brachial indices bilaterally.  Today it measures 1.12 on the left a 1.14 on the right.    Assessment:  status post extra anatomic bilateral bypass with removal of infected aortic graft  Plan: the patient continues to do very well.  He is back to normal activities and has no complaints today.  I did give him a prescription for Diflucan for the yeast in his groin.  He has tried nystatin powder and has not had this worked.  He is scheduled to come back in one year with a duplex ultrasound of bilateral axillary femoral bypass grafts as well as echo brachial indices   V. Leia Alf, M.D. [facility] [place] Office: [phone] Pager:  [phone]

## 2013-10-09 NOTE — Addendum Note (Signed)
Addended by: Mena Goes on: 10/09/2013 04:28 PM   Modules accepted: Orders

## 2014-02-12 ENCOUNTER — Ambulatory Visit (INDEPENDENT_AMBULATORY_CARE_PROVIDER_SITE_OTHER): Payer: Medicare Other | Admitting: Infectious Disease

## 2014-02-12 ENCOUNTER — Encounter: Payer: Self-pay | Admitting: Infectious Disease

## 2014-02-12 VITALS — BP 147/77 | HR 58 | Temp 98.5°F | Wt 195.0 lb

## 2014-02-12 DIAGNOSIS — A498 Other bacterial infections of unspecified site: Secondary | ICD-10-CM

## 2014-02-12 DIAGNOSIS — T827XXA Infection and inflammatory reaction due to other cardiac and vascular devices, implants and grafts, initial encounter: Secondary | ICD-10-CM

## 2014-02-12 DIAGNOSIS — R7881 Bacteremia: Secondary | ICD-10-CM

## 2014-02-12 MED ORDER — LEVOFLOXACIN 500 MG PO TABS: 500.0000 mg | ORAL_TABLET | Freq: Every day | ORAL | Status: DC

## 2014-02-12 NOTE — Progress Notes (Signed)
Subjective:    Patient ID: Gregory Hodges, male    DOB: Feb 16, 1941, 73 y.o.   MRN: [id number]  HPI   73 yo with history of CAD, Crohns disease, who underwent open aneurysm repair with an aorto-bi-external iliac graft in June 2011. The patient's postoperative course was complicated by dehiscence, which required a return trip to the operating room for abdominal wall closure. This was done on March 10, 2010. 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 He was brought to the ED with abdominal pain. He was taken urgently to the OR at [facility] by Dr. 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 The patient underwent Bilateral axillary-to-common femoral artery bypass graft with 8-mm ringed PTFE . Explant of aorto-bi-external iliac graft with closure of aortic stump. Ligation of left renal vein. Lysis of adhesions x 90 minutes, Primary repair of enterocutaneous fistul, Omental flap, Wound VAC temporary abdominal closure. Blood cultures and ntraoperative cultures yielded E coli. HE was narrowed from vancomycin and zosyn intially to rocephin and then dc on oral levaquin 500mg  daily. He has continued on this since then. He has had no recurrence of fevers, chills nausea. He  Previously had discharge from superior aspect of the wound but this had resolved  He returns to me for yearly followup.   He has no complaints. His prior rash responded to topical steroids rather than antifungals.   Review of Systems  Constitutional: Negative for fever, chills, diaphoresis, activity change, appetite change, fatigue and unexpected weight change.  HENT: Negative for congestion, rhinorrhea, sinus pressure, sneezing, sore throat and trouble swallowing.   Eyes: Negative for photophobia and visual disturbance.  Respiratory: Negative for cough, chest tightness, shortness of breath,  wheezing and stridor.   Cardiovascular: Negative for chest pain, palpitations and leg swelling.  Gastrointestinal: Negative for nausea, vomiting, abdominal pain, diarrhea, constipation, blood in stool, abdominal distention and anal bleeding.  Genitourinary: Negative for dysuria, hematuria, flank pain and difficulty urinating.  Musculoskeletal: Negative for arthralgias, back pain, gait problem, joint swelling and myalgias.  Skin: Positive for color change and wound. Negative for pallor and rash.  Neurological: Negative for dizziness, tremors, weakness and light-headedness.  Hematological: Negative for adenopathy. Does not bruise/bleed easily.  Psychiatric/Behavioral: Negative for behavioral problems, confusion, sleep disturbance, dysphoric mood, decreased concentration and agitation.       Objective:   Physical Exam  Constitutional: He is oriented to person, place, and time. He appears well-developed and well-nourished. No distress.  HENT:  Head: Normocephalic and atraumatic.  Mouth/Throat: Oropharynx is clear and moist. No oropharyngeal exudate.  Eyes: Conjunctivae and EOM are normal. Pupils are equal, round, and reactive to light. No scleral icterus.  Neck: Normal range of motion. Neck supple. No JVD present.  Cardiovascular: Normal rate, regular rhythm and normal heart sounds.  Exam reveals no gallop and no friction rub.   No murmur heard. Pulmonary/Chest: Effort normal and breath sounds normal. No respiratory distress. He has no wheezes. He has no rales. He exhibits no tenderness.  Abdominal: He exhibits no distension and no mass. There is no tenderness. There is no rebound and no guarding.  Musculoskeletal: He exhibits no edema and no tenderness.  Lymphadenopathy:    He has no cervical adenopathy.  Neurological: He is alert and oriented to person, place, and time. He has normal reflexes. He exhibits normal muscle tone. Coordination normal.  Skin:  Skin is warm and dry. He is not  diaphoretic. No erythema. No pallor.     Psychiatric: He has a normal mood and affect. His behavior is normal. Judgment and thought content normal.          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 He will consider these options and revisit with [place] in 3 months time. I spent greater than 25 minutes with the patient including greater than 50% of time in face to face counsel of the patient and in coordination of their care.

## 2014-02-12 NOTE — Patient Instructions (Signed)
The antibiotic you are on currently puts you at higher risk for Clostridium difficile (C diff) colitis and infection due to bacteria that predominates when "good bacteria in gut are killed by your use of the levaquin  Two other less riskier antibiotics such as :  Amoxicillin 572m twice daily  Or   Bactrim DS twice daily

## 2014-03-27 ENCOUNTER — Telehealth: Payer: Self-pay | Admitting: *Deleted

## 2014-03-27 NOTE — Telephone Encounter (Signed)
Patient has completed his research, would like to switch to bactrim.  He would like a 90- day [facility] (pharmacy preference already changed). He has plenty of Levaquin on hand to take while waiting for the bactrim. Please advise. Gregory Gandy, RN

## 2014-03-28 NOTE — Telephone Encounter (Signed)
Would then switch him to ONE DS Bactrim Twice daily He will need #60 with 11 refills

## 2014-03-29 ENCOUNTER — Telehealth: Payer: Self-pay | Admitting: *Deleted

## 2014-03-29 DIAGNOSIS — I330 Acute and subacute infective endocarditis: Secondary | ICD-10-CM

## 2014-03-29 MED ORDER — SULFAMETHOXAZOLE-TMP DS 800-160 MG PO TABS: 1.0000 | ORAL_TABLET | Freq: Two times a day (BID) | ORAL | Status: DC

## 2014-03-29 NOTE — Telephone Encounter (Signed)
Meds changed per Dr. Hubert Azure, Lanice Schwab, RN

## 2014-03-29 NOTE — Telephone Encounter (Signed)
Sent to his pharmacy

## 2014-07-18 ENCOUNTER — Encounter: Payer: Self-pay | Admitting: *Deleted

## 2014-07-18 NOTE — Telephone Encounter (Signed)
Error

## 2014-07-18 NOTE — Telephone Encounter (Deleted)
Bactrim DS is covered by "new" Medicare Advantage Plan,

## 2014-10-15 ENCOUNTER — Other Ambulatory Visit (HOSPITAL_COMMUNITY): Payer: Medicare Other

## 2014-10-15 ENCOUNTER — Encounter (HOSPITAL_COMMUNITY): Payer: Medicare Other

## 2014-10-15 ENCOUNTER — Ambulatory Visit: Payer: Medicare Other | Admitting: Family

## 2014-10-22 ENCOUNTER — Other Ambulatory Visit: Payer: Self-pay | Admitting: *Deleted

## 2014-10-22 DIAGNOSIS — Z48812 Encounter for surgical aftercare following surgery on the circulatory system: Secondary | ICD-10-CM

## 2014-10-22 DIAGNOSIS — I739 Peripheral vascular disease, unspecified: Secondary | ICD-10-CM

## 2014-10-25 ENCOUNTER — Encounter: Payer: Self-pay | Admitting: Surgery

## 2014-10-29 ENCOUNTER — Other Ambulatory Visit: Payer: Self-pay | Admitting: Surgery

## 2014-10-29 ENCOUNTER — Other Ambulatory Visit: Payer: Self-pay | Admitting: *Deleted

## 2014-10-29 ENCOUNTER — Ambulatory Visit (HOSPITAL_COMMUNITY)
Admission: RE | Admit: 2014-10-29 | Discharge: 2014-10-29 | Disposition: A | Discharge status: Home / Self Care | Payer: PPO | Source: Ambulatory Visit | Attending: Surgery | Admitting: Surgery

## 2014-10-29 ENCOUNTER — Ambulatory Visit (INDEPENDENT_AMBULATORY_CARE_PROVIDER_SITE_OTHER)
Admission: RE | Admit: 2014-10-29 | Discharge: 2014-10-29 | Disposition: A | Discharge status: Home / Self Care | Payer: PPO | Source: Ambulatory Visit | Attending: Surgery | Admitting: Surgery

## 2014-10-29 ENCOUNTER — Ambulatory Visit (INDEPENDENT_AMBULATORY_CARE_PROVIDER_SITE_OTHER): Payer: PPO | Admitting: Surgery

## 2014-10-29 VITALS — BP 144/81 | HR 59 | Temp 87.7°F | Resp 16 | Ht 69.5 in | Wt 204.5 lb

## 2014-10-29 DIAGNOSIS — Z48812 Encounter for surgical aftercare following surgery on the circulatory system: Secondary | ICD-10-CM

## 2014-10-29 DIAGNOSIS — I714 Abdominal aortic aneurysm, without rupture, unspecified: Secondary | ICD-10-CM

## 2014-10-29 DIAGNOSIS — I739 Peripheral vascular disease, unspecified: Secondary | ICD-10-CM

## 2014-10-29 NOTE — Addendum Note (Signed)
Addended by: Mena Goes on: 10/29/2014 04:14 PM   Modules accepted: Orders

## 2014-10-29 NOTE — Progress Notes (Signed)
Patient name: Gregory Hodges MRN: [id number] DOB: 28-Oct-1940 Sex: male     Chief Complaint  Patient presents with  . PVD    1 year followup     HISTORY OF PRESENT ILLNESS: The patient is back today for followup. In 2011, October, he presented with an infected aortic graft with contained rupture. His previous repair was done at [facility]. 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 I took him emergently to the operating room and proceeded with bilateral axillary femoral bypass graft with 8 mm ring PTFE. I removed his infected graft and oversewed his aorta. I had to leave a small cuff on the distal iliac artery because it was well incorporated. I have recommended lifelong antibiotics. 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   He is doing very well today.  He remains active.  He will occasionally get leg pain with prolonged activity.  He denies any fevers chills or night sweats.  Past Medical History  Diagnosis Date  . Peripheral vascular disease June 2011  . Myocardial infarction 2006  . AAA (abdominal aortic aneurysm)   . Hypertension   . E coli bacteremia   . 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Past Surgical History  Procedure Laterality Date  . Axillary-femoral bypass graft  07/22/2010    bilateral  . Bowel      herniated bowel  . Abdominal aortic aneurysm repair  03/03/2010  . Coronary stent placement      History   Social History  . Marital Status: Married    Spouse Name: N/A    Number of Children: N/A  . Years of Education: N/A   Occupational History  . Not on file.   Social History Main Topics  . Smoking status: Current Every Day Smoker -- 1.00 packs/day for 30 years    Types: Cigarettes  . Smokeless tobacco: Never Used  . Alcohol Use: 0.0 - 0.5 oz/week    0-1 drink(s) per week  . Drug Use: No  . Sexual Activity: Not on file   Other Topics Concern  . Not  on file   Social History Narrative  . No narrative on file    No family history on file.  Allergies as of 10/29/2014 - Review Complete 10/29/2014  Allergen Reaction Noted  . Oxycodone  09/19/2012    Current Outpatient Prescriptions on File Prior to Visit  Medication Sig Dispense Refill  . aspirin 81 MG tablet Take 81 mg by mouth daily.      Marland Kitchen atenolol (TENORMIN) 25 MG tablet Take 25 mg by mouth daily.      Marland Kitchen atorvastatin (LIPITOR) 40 MG tablet Take 40 mg by mouth daily.    Marland Kitchen docusate sodium (COLACE) 50 MG capsule Take by mouth 2 (two) times daily. At bedtime     . lisinopril (PRINIVIL,ZESTRIL) 40 MG tablet Take 20 mg by mouth daily.     . mercaptopurine (PURINETHOL) 50 MG tablet Take 50 mg by mouth daily. Give on an empty stomach 1 hour before or 2 hours after meals. Caution: Chemotherapy.     . nystatin (MYCOSTATIN) powder Apply topically 4 (four) times daily. 30 g 0  . omeprazole (PRILOSEC) 20 MG capsule Take 20 mg by mouth daily.     . simvastatin (ZOCOR) 80 MG tablet Take 40 mg by mouth at bedtime.     . sulfamethoxazole-trimethoprim (BACTRIM DS)  800-160 MG per tablet Take 1 tablet by mouth 2 (two) times daily. 800-160 mg 60 tablet 11   No current facility-administered medications on file prior to visit.     REVIEW OF SYSTEMS: Cardiovascular: No chest pain, chest pressure, palpitations, orthopnea, or dyspnea on exertion. Positive for leg pain with walking Pulmonary: No productive cough, asthma or wheezing. Neurologic: positive for leg weakness.  Noparesthesias, aphasia, or amaurosis. No dizziness. Hematologic: No bleeding problems or clotting disorders. Musculoskeletal: No joint pain or joint swelling. Gastrointestinal: No blood in stool or hematemesis Genitourinary: No dysuria or hematuria. Psychiatric:: No history of major depression. Integumentary: No rashes or ulcers. Constitutional: No fever or chills.  PHYSICAL EXAMINATION:   Vital signs are BP 144/81 mmHg  Pulse  59  Temp(Src) 87.7 F (30.9 C) (Oral)  Resp 16  Ht 5' 9.5" (1.765 m)  Wt 204 lb 8 oz (92.761 kg)  BMI 29.78 kg/m2  SpO2 99% General: The patient appears their stated age. HEENT:  No gross abnormalities Pulmonary:  Non labored breathing Abdomen: Soft and non-tendereasily reducible hernia.  Eschar remains at the superior aspect of the incision which has not changed Musculoskeletal: There are no major deformities. Neurologic: No focal weakness or paresthesias are detected, Skin: There are no ulcer or rashes noted. Psychiatric: The patient has normal affect. Cardiovascular: There is a regular rate and rhythm without significant murmur appreciated.easily palpable ask femoral bypass graft pulse and femoral femoral pulse   Diagnostic Studies I have reviewed his ultrasound studies from today.  His ankle-brachial indices are 0.96 on the left and 1.01 on the right both with triphasic waveforms.  Duplex does not show any hemodynamically significant stenosis.  ABIs have decreased just slightly from 1 year ago  Assessment: Status post emergent repair of infected ruptured abdominal aortic aneurysm Plan: The patient continues to do very well.  He is on lifelong suppressive therapy for graft infection.  His axillary femoral bypass graft remains widely patent.  We discussed the signs and symptoms of graft failure.  I will plan on having him return in 1 year with repeat ultrasound.his last CT scan was in 2012.  This will likely need to be repeated in approximately 1-2 years.  Eldridge Abrahams, M.D. [facility] [place] Office: [phone] Pager:  [phone]

## 2014-10-29 NOTE — Patient Instructions (Signed)
Please review the tobacco cessation information given to you today. It lists many hints that are useful in your effort to stop smoking. The [facility] contact phone # is [phone] These nurses and advisors offer lots of FREE information and aids to help you quit.    The [place] Quit Smoking line #  [phone], they will also assist you with programs designed to help you stop smoking.

## 2015-10-28 ENCOUNTER — Encounter: Payer: Self-pay | Admitting: Surgery

## 2015-11-01 ENCOUNTER — Other Ambulatory Visit: Payer: Self-pay | Admitting: *Deleted

## 2015-11-01 DIAGNOSIS — Z48812 Encounter for surgical aftercare following surgery on the circulatory system: Secondary | ICD-10-CM

## 2015-11-01 DIAGNOSIS — I739 Peripheral vascular disease, unspecified: Secondary | ICD-10-CM

## 2015-11-04 ENCOUNTER — Ambulatory Visit (INDEPENDENT_AMBULATORY_CARE_PROVIDER_SITE_OTHER)
Admission: RE | Admit: 2015-11-04 | Discharge: 2015-11-04 | Disposition: A | Discharge status: Home / Self Care | Payer: PPO | Source: Ambulatory Visit | Attending: Surgery | Admitting: Surgery

## 2015-11-04 ENCOUNTER — Ambulatory Visit (INDEPENDENT_AMBULATORY_CARE_PROVIDER_SITE_OTHER): Payer: PPO | Admitting: Surgery

## 2015-11-04 ENCOUNTER — Encounter: Payer: Self-pay | Admitting: Surgery

## 2015-11-04 ENCOUNTER — Ambulatory Visit (HOSPITAL_COMMUNITY)
Admission: RE | Admit: 2015-11-04 | Discharge: 2015-11-04 | Disposition: A | Discharge status: Home / Self Care | Payer: PPO | Source: Ambulatory Visit | Attending: Surgery | Admitting: Surgery

## 2015-11-04 ENCOUNTER — Other Ambulatory Visit: Payer: Self-pay | Admitting: Surgery

## 2015-11-04 VITALS — BP 132/85 | HR 59 | Ht 69.5 in | Wt 202.9 lb

## 2015-11-04 DIAGNOSIS — Z48812 Encounter for surgical aftercare following surgery on the circulatory system: Secondary | ICD-10-CM | POA: Diagnosis not present

## 2015-11-04 DIAGNOSIS — I739 Peripheral vascular disease, unspecified: Secondary | ICD-10-CM | POA: Diagnosis not present

## 2015-11-04 DIAGNOSIS — I714 Abdominal aortic aneurysm, without rupture, unspecified: Secondary | ICD-10-CM

## 2015-11-04 DIAGNOSIS — I713 Abdominal aortic aneurysm, ruptured, unspecified: Secondary | ICD-10-CM

## 2015-11-04 DIAGNOSIS — I10 Essential (primary) hypertension: Secondary | ICD-10-CM | POA: Diagnosis not present

## 2015-11-04 NOTE — Progress Notes (Signed)
Patient name: Gregory Hodges MRN: [id number] DOB: 18-Feb-1941 Sex: male     Chief Complaint  Patient presents with  . Re-evaluation    1 year f/u     HISTORY OF PRESENT ILLNESS: The patient is back today for followup. In 2011, October, he presented with an infected aortic graft with contained rupture. His previous repair was done at [facility]. 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 I took him emergently to the operating room and proceeded with bilateral axillary femoral bypass graft with 8 mm ring PTFE. I removed his infected graft and oversewed his aorta. I had to leave a small cuff on the distal iliac artery because it was well incorporated. I have recommended lifelong antibiotics. 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  He is doing well today without complaints other than the fact that his abdominal wall hernia does bother him on occasion.  He denies any pain.  It is just simply the bulge.  Past Medical History  Diagnosis Date  . Peripheral vascular disease [facility]) June 2011  . Myocardial infarction ([place]) 2006  . AAA (abdominal aortic aneurysm) ([place])   . Hypertension   . E coli bacteremia   . 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[facility])     Past Surgical History  Procedure Laterality Date  . Axillary-femoral bypass graft  07/22/2010    bilateral  . Bowel      herniated bowel  . Abdominal aortic aneurysm repair  03/03/2010  . Coronary stent placement      Social History   Social History  . Marital Status: Married    Spouse Name: N/A  . Number of Children: N/A  . Years of Education: N/A   Occupational History  . Not on file.   Social History Main Topics  . Smoking status: Current Every Day Smoker -- 1.00 packs/day for 30 years    Types: Cigarettes  . Smokeless tobacco: Never Used  . Alcohol Use: 0.0 - 0.5 oz/week    0-1 drink(s) per week  . Drug Use: No  . Sexual  Activity: Not on file   Other Topics Concern  . Not on file   Social History Narrative    No family history on file.  Allergies as of 11/04/2015 - Review Complete 11/04/2015  Allergen Reaction Noted  . Oxycodone  09/19/2012    Current Outpatient Prescriptions on File Prior to Visit  Medication Sig Dispense Refill  . aspirin 81 MG tablet Take 81 mg by mouth daily.      Marland Kitchen atenolol (TENORMIN) 25 MG tablet Take 25 mg by mouth daily.      Marland Kitchen atorvastatin (LIPITOR) 40 MG tablet Take 40 mg by mouth daily.    Marland Kitchen docusate sodium (COLACE) 50 MG capsule Take by mouth 2 (two) times daily. At bedtime     . lisinopril (PRINIVIL,ZESTRIL) 40 MG tablet Take 20 mg by mouth daily.     Marland Kitchen omeprazole (PRILOSEC) 20 MG capsule Take 20 mg by mouth daily.     Marland Kitchen sulfamethoxazole-trimethoprim (BACTRIM DS) 800-160 MG per tablet Take 1 tablet by mouth 2 (two) times daily. 800-160 mg 60 tablet 11  . mercaptopurine (PURINETHOL) 50 MG tablet Take 50 mg by mouth daily. Reported on 11/04/2015    . nystatin (MYCOSTATIN) powder Apply topically 4 (four) times daily. (Patient not taking: Reported on 11/04/2015) 30 g 0  . simvastatin (ZOCOR)  80 MG tablet Take 40 mg by mouth at bedtime. Reported on 11/04/2015     No current facility-administered medications on file prior to visit.     REVIEW OF SYSTEMS: Cardiovascular: No chest pain, chest pressure Pulmonary: No productive cough, asthma or wheezing. Neurologic: No weakness, paresthesias, aphasia, or amaurosis. No dizziness. Hematologic: No bleeding problems or clotting disorders. Musculoskeletal: No joint pain or joint swelling. Gastrointestinal: No blood in stool or hematemesis.  Positive hernia Genitourinary: No dysuria or hematuria. Psychiatric:: No history of major depression. Integumentary: No rashes or ulcers. Constitutional: No fever or chills.  PHYSICAL EXAMINATION:   Vital signs are  Filed Vitals:   11/04/15 0932  BP: 132/85  Pulse: 59  Height: 5' 9.5"  (1.765 m)  Weight: 202 lb 14.4 oz (92.035 kg)  SpO2: 96%   Body mass index is 29.54 kg/(m^2). General: The patient appears their stated age. HEENT:  No gross abnormalities Pulmonary:  Non labored breathing Abdomen: Soft and non-tender.  Easily reducible large ventral hernia Musculoskeletal: There are no major deformities. Neurologic: No focal weakness or paresthesias are detected, Skin: There are no ulcer or rashes noted. Psychiatric: The patient has normal affect. Cardiovascular: There is a regular rate and rhythm without significant murmur appreciated.  Palpable bilateral graft pulse.  Palpable pedal pulse   Diagnostic Studies I have reviewed his ultrasound.  All waveforms are triphasic.  ABIs are over 1.0.  Assessment: Status post bilateral axillary femoral bypass graft and removal of infected aorta. Plan: The patient continues to do remarkably well.  I offered referral to general surgery to discuss hernia repair, however he is leaning against having this done, which I do not disagree with.  He will follow up in one year with a graft ultrasound  V. Leia Alf, M.D. [facility] [place] Office: [phone] Pager:  [phone]

## 2015-11-04 NOTE — Addendum Note (Signed)
Addended by: Dorthula Rue L on: 11/04/2015 05:07 PM   Modules accepted: Orders

## 2015-11-06 DIAGNOSIS — H2513 Age-related nuclear cataract, bilateral: Secondary | ICD-10-CM | POA: Diagnosis not present

## 2015-11-06 DIAGNOSIS — D3132 Benign neoplasm of left choroid: Secondary | ICD-10-CM | POA: Diagnosis not present

## 2015-11-06 DIAGNOSIS — H43811 Vitreous degeneration, right eye: Secondary | ICD-10-CM | POA: Diagnosis not present

## 2016-02-05 DIAGNOSIS — L570 Actinic keratosis: Secondary | ICD-10-CM | POA: Diagnosis not present

## 2016-02-05 DIAGNOSIS — L821 Other seborrheic keratosis: Secondary | ICD-10-CM | POA: Diagnosis not present

## 2016-02-20 DIAGNOSIS — E785 Hyperlipidemia, unspecified: Secondary | ICD-10-CM | POA: Diagnosis not present

## 2016-02-20 DIAGNOSIS — Z1211 Encounter for screening for malignant neoplasm of colon: Secondary | ICD-10-CM | POA: Diagnosis not present

## 2016-02-20 DIAGNOSIS — Z720 Tobacco use: Secondary | ICD-10-CM | POA: Diagnosis not present

## 2016-02-20 DIAGNOSIS — L309 Dermatitis, unspecified: Secondary | ICD-10-CM | POA: Diagnosis not present

## 2016-02-20 DIAGNOSIS — I10 Essential (primary) hypertension: Secondary | ICD-10-CM | POA: Diagnosis not present

## 2016-02-20 DIAGNOSIS — Z0000 Encounter for general adult medical examination without abnormal findings: Secondary | ICD-10-CM | POA: Diagnosis not present

## 2016-02-20 DIAGNOSIS — D509 Iron deficiency anemia, unspecified: Secondary | ICD-10-CM | POA: Diagnosis not present

## 2016-02-20 DIAGNOSIS — M79609 Pain in unspecified limb: Secondary | ICD-10-CM | POA: Diagnosis not present

## 2016-02-20 DIAGNOSIS — Z23 Encounter for immunization: Secondary | ICD-10-CM | POA: Diagnosis not present

## 2016-02-27 DIAGNOSIS — Z1211 Encounter for screening for malignant neoplasm of colon: Secondary | ICD-10-CM | POA: Diagnosis not present

## 2016-04-02 ENCOUNTER — Other Ambulatory Visit: Payer: Self-pay | Admitting: Acute Care

## 2016-04-02 DIAGNOSIS — F17210 Nicotine dependence, cigarettes, uncomplicated: Secondary | ICD-10-CM

## 2016-04-29 ENCOUNTER — Ambulatory Visit: Admission: RE | Admit: 2016-04-29 | Payer: PPO | Source: Ambulatory Visit

## 2016-04-29 ENCOUNTER — Telehealth: Payer: Self-pay | Admitting: Acute Care

## 2016-04-29 ENCOUNTER — Encounter: Payer: PPO | Admitting: Acute Care

## 2016-04-29 NOTE — Telephone Encounter (Signed)
Opened msg in error.  Closing.

## 2016-08-05 ENCOUNTER — Other Ambulatory Visit: Payer: Self-pay | Admitting: Dermatology

## 2016-08-05 DIAGNOSIS — L570 Actinic keratosis: Secondary | ICD-10-CM | POA: Diagnosis not present

## 2016-08-05 DIAGNOSIS — C44622 Squamous cell carcinoma of skin of right upper limb, including shoulder: Secondary | ICD-10-CM | POA: Diagnosis not present

## 2016-10-26 DIAGNOSIS — R220 Localized swelling, mass and lump, head: Secondary | ICD-10-CM | POA: Diagnosis not present

## 2016-10-31 ENCOUNTER — Encounter (HOSPITAL_COMMUNITY): Payer: Self-pay | Admitting: Emergency Medicine

## 2016-10-31 ENCOUNTER — Emergency Department (HOSPITAL_COMMUNITY)
Admission: EM | Admit: 2016-10-31 | Discharge: 2016-10-31 | Disposition: A | Discharge status: Home / Self Care | Payer: PPO | Attending: Emergency Medicine | Admitting: Emergency Medicine

## 2016-10-31 DIAGNOSIS — I10 Essential (primary) hypertension: Secondary | ICD-10-CM | POA: Diagnosis not present

## 2016-10-31 DIAGNOSIS — F17210 Nicotine dependence, cigarettes, uncomplicated: Secondary | ICD-10-CM | POA: Diagnosis not present

## 2016-10-31 DIAGNOSIS — Y929 Unspecified place or not applicable: Secondary | ICD-10-CM | POA: Diagnosis not present

## 2016-10-31 DIAGNOSIS — X58XXXA Exposure to other specified factors, initial encounter: Secondary | ICD-10-CM | POA: Insufficient documentation

## 2016-10-31 DIAGNOSIS — Z955 Presence of coronary angioplasty implant and graft: Secondary | ICD-10-CM | POA: Diagnosis not present

## 2016-10-31 DIAGNOSIS — I252 Old myocardial infarction: Secondary | ICD-10-CM | POA: Insufficient documentation

## 2016-10-31 DIAGNOSIS — S0121XA Laceration without foreign body of nose, initial encounter: Secondary | ICD-10-CM | POA: Insufficient documentation

## 2016-10-31 DIAGNOSIS — Z7982 Long term (current) use of aspirin: Secondary | ICD-10-CM | POA: Diagnosis not present

## 2016-10-31 DIAGNOSIS — Y939 Activity, unspecified: Secondary | ICD-10-CM | POA: Diagnosis not present

## 2016-10-31 DIAGNOSIS — S0992XA Unspecified injury of nose, initial encounter: Secondary | ICD-10-CM | POA: Diagnosis not present

## 2016-10-31 DIAGNOSIS — Y999 Unspecified external cause status: Secondary | ICD-10-CM | POA: Insufficient documentation

## 2016-10-31 MED ORDER — LIDOCAINE HCL (PF) 1 % IJ SOLN
30.0000 mL | Freq: Once | INTRAMUSCULAR | Status: AC
  Administered 2016-10-31: 30 mL

## 2016-10-31 MED ORDER — OXYMETAZOLINE HCL 0.05 % NA SOLN
1.0000 | Freq: Once | NASAL | Status: AC
  Administered 2016-10-31: 1 via NASAL

## 2016-10-31 MED ORDER — LIDOCAINE HCL (PF) 1 % IJ SOLN
INTRAMUSCULAR | Status: AC
  Filled 2016-10-31: qty 5

## 2016-10-31 MED ORDER — LIDOCAINE HCL (PF) 1 % IJ SOLN
INTRAMUSCULAR | Status: AC
  Administered 2016-10-31: 30 mL
  Filled 2016-10-31: qty 30

## 2016-10-31 NOTE — ED Notes (Signed)
Dr. Zenia Resides at bedside to examine pt. ENT cart and Suture cart at bedside

## 2016-10-31 NOTE — Discharge Instructions (Signed)
Have the suture removed in 4-5 days. Return here for any increased bleeding

## 2016-10-31 NOTE — ED Triage Notes (Signed)
Pt c/o nosebleed onset 3 hours PTA. Pt denies injury.

## 2016-10-31 NOTE — ED Notes (Signed)
Pt comfortable with discharge and follow up instructions. Pt declines wheelchair, escorted to waiting area by this RN. Rx x0

## 2016-10-31 NOTE — ED Provider Notes (Signed)
[facility] DEPT Provider Note   CSN: [id number] Arrival date & time: 10/31/16  1638     History   Chief Complaint Chief Complaint  Patient presents with  . Epistaxis    HPI BRIX Gregory Hodges is a 76 y.o. male.  76 year old male presents with epistaxis which began today. Does not take any blood thinners. He began to notice bleeding from the left side of his nose. Used direct pressure which did not help. Denies any trauma to his nose. Does have a history of prior surgery there for skin cancer.      Past Medical History:  Diagnosis Date  . AAA (abdominal aortic aneurysm) ([place])   . E coli bacteremia   . Hypertension   . Myocardial infarction 2006  . Peripheral vascular disease [facility]) June 2011  . 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 E coli bacteremia   . 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 Peripheral vascular disease, unspecified 10/09/2013  . Yeast infection 01/26/2013  . Atherosclerosis of native arteries of the extremities with intermittent claudication 03/21/2012  . Wound drainage 01/13/2012  . Hematuria 01/09/2011  . 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 ACUTE AND SUBACUTE BACTERIAL ENDOCARDITIS 08/20/2010  . AAA 08/20/2010  . INF&INFLAM REACT DUE UNSPEC DEVICE IMPLANT&GRAFT 08/20/2010    Past Surgical History:  Procedure Laterality Date  . ABDOMINAL AORTIC ANEURYSM REPAIR  03/03/2010  . AXILLARY-FEMORAL BYPASS GRAFT  07/22/2010   bilateral  . bowel     herniated bowel  . CORONARY STENT PLACEMENT         Home Medications    Prior to Admission medications   Medication Sig Start Date End Date Taking? Authorizing Provider  aspirin 81 MG tablet Take 81 mg by mouth daily.      Historical Provider, MD  atenolol (TENORMIN) 25 MG tablet Take 25 mg by mouth daily.      Historical Provider, MD  atorvastatin (LIPITOR) 40 MG tablet Take 40 mg by mouth daily.    Historical Provider, MD  docusate  sodium (COLACE) 50 MG capsule Take by mouth 2 (two) times daily. At bedtime     Historical Provider, MD  lisinopril (PRINIVIL,ZESTRIL) 40 MG tablet Take 20 mg by mouth daily.     Historical Provider, MD  mercaptopurine (PURINETHOL) 50 MG tablet Take 50 mg by mouth daily. Reported on 11/04/2015    Historical Provider, MD  nystatin (MYCOSTATIN) powder Apply topically 4 (four) times daily. Patient not taking: Reported on 11/04/2015 09/19/12   Serafina Mitchell, MD  omeprazole (PRILOSEC) 20 MG capsule Take 20 mg by mouth daily.     Historical Provider, MD  simvastatin (ZOCOR) 80 MG tablet Take 40 mg by mouth at bedtime. Reported on 11/04/2015    Historical Provider, MD  sulfamethoxazole-trimethoprim (BACTRIM DS) 800-160 MG per tablet Take 1 tablet by mouth 2 (two) times daily. 800-160 mg 03/29/14   Truman Hayward, MD    Family History No family history on file.  Social History Social History  Substance Use Topics  . Smoking status: Current Every Day Smoker    Packs/day: 1.00    Years: 30.00    Types: Cigarettes  . Smokeless tobacco: Never Used  . Alcohol use 0.0 - 0.5 oz/week     Allergies   Oxycodone   Review of Systems Review of Systems  All other systems reviewed and are negative.    Physical Exam  Updated Vital Signs BP 131/66   Pulse 60   Temp 98.3 F (36.8 C) (Oral)   Resp 20   Ht 5' 9"  (1.753 m)   Wt 89.8 kg   SpO2 94%   BMI 29.24 kg/m   Physical Exam  Constitutional: He is oriented to person, place, and time. He appears well-developed and well-nourished.  Non-toxic appearance. No distress.  HENT:  Head: Normocephalic and atraumatic.  Nose:    No bleeding from deep inside the nose appreciated.  Eyes: Conjunctivae, EOM and lids are normal. Pupils are equal, round, and reactive to light.  Neck: Normal range of motion. Neck supple. No tracheal deviation present. No thyroid mass present.  Cardiovascular: Normal rate, regular rhythm and normal heart sounds.  Exam  reveals no gallop.   No murmur heard. Pulmonary/Chest: Effort normal and breath sounds normal. No stridor. No respiratory distress. He has no decreased breath sounds. He has no wheezes. He has no rhonchi. He has no rales.  Abdominal: Soft. Normal appearance and bowel sounds are normal. He exhibits no distension. There is no tenderness. There is no rebound and no CVA tenderness.  Musculoskeletal: Normal range of motion. He exhibits no edema or tenderness.  Neurological: He is alert and oriented to person, place, and time. He has normal strength. No cranial nerve deficit or sensory deficit. GCS eye subscore is 4. GCS verbal subscore is 5. GCS motor subscore is 6.  Skin: Skin is warm and dry. No abrasion and no rash noted.  Psychiatric: He has a normal mood and affect. His speech is normal and behavior is normal.  Nursing note and vitals reviewed.    ED Treatments / Results  Labs (all labs ordered are listed, but only abnormal results are displayed) Labs Reviewed - No data to display  EKG  EKG Interpretation None       Radiology No results found.  Procedures Procedures (including critical care time)  Medications Ordered in ED Medications  oxymetazoline (AFRIN) 0.05 % nasal spray 1 spray (not administered)     Initial Impression / Assessment and Plan / ED Course  I have reviewed the triage vital signs and the nursing notes.  Pertinent labs & imaging results that were available during my care of the patient were reviewed by me and considered in my medical decision making (see chart for details).     LACERATION REPAIR Performed by: Leota Jacobsen Authorized by: Leota Jacobsen Consent: Verbal consent obtained. Risks and benefits: risks, benefits and alternatives were discussed Consent given by: patient Patient identity confirmed: provided demographic data Prepped and Draped in normal sterile fashion Wound explored  Laceration Location: Nose  Laceration Length: 0.5  cm  No Foreign Bodies seen or palpated  Anesthesia: local infiltration  Local anesthetic: lidocaine 0 % 0 epinephrine  Anesthetic total: 0 ml  Irrigation method: syringe Amount of cleaning: standard  Skin closure: Simple interrupted   Number of sutures: One   Technique: 1 6-0 Prolene stitch used   Patient tolerance: Patient tolerated the procedure well with no immediate complications.    Final Clinical Impressions(s) / ED Diagnoses   Final diagnoses:  None   External bleeding source  1 stitch. Patient has adequate hemostasis. Return precautions given New Prescriptions New Prescriptions   No medications on file     Lacretia Leigh, MD 10/31/16 1113

## 2016-11-03 ENCOUNTER — Encounter: Payer: Self-pay | Admitting: Surgery

## 2016-11-04 DIAGNOSIS — Z23 Encounter for immunization: Secondary | ICD-10-CM | POA: Diagnosis not present

## 2016-11-04 DIAGNOSIS — Z4802 Encounter for removal of sutures: Secondary | ICD-10-CM | POA: Diagnosis not present

## 2016-11-09 ENCOUNTER — Ambulatory Visit (INDEPENDENT_AMBULATORY_CARE_PROVIDER_SITE_OTHER)
Admission: RE | Admit: 2016-11-09 | Discharge: 2016-11-09 | Disposition: A | Discharge status: Home / Self Care | Payer: PPO | Source: Ambulatory Visit | Attending: Surgery | Admitting: Surgery

## 2016-11-09 ENCOUNTER — Ambulatory Visit (HOSPITAL_COMMUNITY)
Admission: RE | Admit: 2016-11-09 | Discharge: 2016-11-09 | Disposition: A | Discharge status: Home / Self Care | Payer: PPO | Source: Ambulatory Visit | Attending: Surgery | Admitting: Surgery

## 2016-11-09 ENCOUNTER — Ambulatory Visit (INDEPENDENT_AMBULATORY_CARE_PROVIDER_SITE_OTHER): Payer: PPO | Admitting: Surgery

## 2016-11-09 ENCOUNTER — Encounter: Payer: Self-pay | Admitting: Surgery

## 2016-11-09 VITALS — BP 127/76 | HR 68 | Temp 98.5°F | Resp 20 | Ht 69.0 in | Wt 191.0 lb

## 2016-11-09 DIAGNOSIS — I713 Abdominal aortic aneurysm, ruptured, unspecified: Secondary | ICD-10-CM

## 2016-11-09 DIAGNOSIS — I739 Peripheral vascular disease, unspecified: Secondary | ICD-10-CM | POA: Diagnosis not present

## 2016-11-09 LAB — VAS US LOWER EXTREMITY ARTERIAL DUPLEX
LATIBDISTSYS: 50 cm/s
LEFT PERO DIST SYS: 16 cm/s
LPTIBDISTSYS: 43 cm/s
Left popliteal dist sys PSV: -36 cm/s
Left super femoral dist sys PSV: -25 cm/s
Left super femoral mid sys PSV: -41 cm/s
Left super femoral prox sys PSV: 59 cm/s
RATIBDISTSYS: 39 cm/s
RIGHT POST TIB DIST SYS: 37 cm/s
RPERPSV: 19 cm/s
RSFPPSV: 115 cm/s
Right popliteal dist sys PSV: -42 cm/s
Right super femoral dist sys PSV: -31 cm/s
Right super femoral mid sys PSV: 47 cm/s

## 2016-11-09 NOTE — Addendum Note (Signed)
Addended by: Lianne Cure A on: 11/09/2016 02:24 PM   Modules accepted: Orders

## 2016-11-09 NOTE — Progress Notes (Signed)
Vascular and Vein Specialist of [place]  Patient name: Gregory Hodges MRN: [id number] DOB: 07/21/1941 Sex: male   REASON FOR VISIT:    F/U ruptured AAA  HISOTRY OF PRESENT ILLNESS:   The patient is back today for followup. In 2011, October, he presented with an infected aortic graft with contained rupture. His previous repair was done at [facility]. 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 I took him emergently to the operating room and proceeded with bilateral axillary femoral bypass graft with 8 mm ring PTFE. I removed his infected graft and oversewed his aorta. I had to leave a small cuff on the distal iliac artery because it was well incorporated. I have recommended lifelong antibiotics. 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  He recently developed angioedema secondary to an ACE inhibitor.  Otherwise he has had no complaints this past year.  He states that he does get cramps a little sooner in his legs when he is on the treadmill.  PAST MEDICAL HISTORY:   Past Medical History:  Diagnosis Date  . AAA (abdominal aortic aneurysm) ([place])   . E coli bacteremia   . Hypertension   . Myocardial infarction 2006  . Peripheral vascular disease [facility]) June 2011  . 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 No pertinent family history.  SOCIAL HISTORY:   Social History  Substance Use Topics  . Smoking status: Current Every Day Smoker    Packs/day: 1.00    Years: 30.00    Types: Cigarettes  . Smokeless tobacco: Never Used  . Alcohol use 0.0 - 0.5 oz/week     ALLERGIES:   Allergies  Allergen Reactions  . Oxycodone     Pt states he can not take any narcotic pain pills     CURRENT MEDICATIONS:   Current Outpatient Prescriptions  Medication Sig Dispense Refill  . aspirin 81 MG tablet Take 81 mg by  mouth daily.      Marland Kitchen atenolol (TENORMIN) 25 MG tablet Take 25 mg by mouth daily.      Marland Kitchen atorvastatin (LIPITOR) 40 MG tablet Take 40 mg by mouth daily.    Marland Kitchen docusate sodium (COLACE) 50 MG capsule Take by mouth 2 (two) times daily. At bedtime     . mercaptopurine (PURINETHOL) 50 MG tablet Take 50 mg by mouth daily. Reported on 11/04/2015    . nystatin (MYCOSTATIN) powder Apply topically 4 (four) times daily. 30 g 0  . omeprazole (PRILOSEC) 20 MG capsule Take 20 mg by mouth daily.     . simvastatin (ZOCOR) 80 MG tablet Take 40 mg by mouth at bedtime. Reported on 11/04/2015    . sulfamethoxazole-trimethoprim (BACTRIM DS) 800-160 MG per tablet Take 1 tablet by mouth 2 (two) times daily. 800-160 mg 60 tablet 11  . lisinopril (PRINIVIL,ZESTRIL) 40 MG tablet Take 20 mg by mouth daily.      No current facility-administered medications for this visit.     REVIEW OF SYSTEMS:   [X]  denotes positive finding, [ ]  denotes negative finding Cardiac  Comments:  Chest pain or chest pressure:    Shortness of breath upon exertion:    Short of breath when lying flat:    Irregular heart rhythm:        Vascular    Pain in calf, thigh, or hip brought on by ambulation: x  Pain in feet at night that wakes you up from your sleep:     Blood clot in your veins:    Leg swelling:         Pulmonary    Oxygen at home:    Productive cough:     Wheezing:         Neurologic    Sudden weakness in arms or legs:     Sudden numbness in arms or legs:     Sudden onset of difficulty speaking or slurred speech:    Temporary loss of vision in one eye:     Problems with dizziness:         Gastrointestinal    Blood in stool:     Vomited blood:         Genitourinary    Burning when urinating:     Blood in urine:        Psychiatric    Major depression:         Hematologic    Bleeding problems:    Problems with blood clotting too easily:        Skin    Rashes or ulcers:        Constitutional    Fever or chills:       PHYSICAL EXAM:   Vitals:   11/09/16 0915  BP: 127/76  Pulse: 68  Resp: 20  Temp: 98.5 F (36.9 C)  TempSrc: Oral  SpO2: 96%  Weight: 191 lb (86.6 kg)  Height: 5' 9"  (1.753 m)    GENERAL: The patient is a well-nourished male, in no acute distress. The vital signs are documented above. CARDIAC: There is a regular rate and rhythm.  VASCULAR: Palpable graft pulse.  All incisions are healed. PULMONARY: Non-labored respirations ABDOMEN: Large easily reducible ventral hernia, unchanged  MUSCULOSKELETAL: There are no major deformities or cyanosis. NEUROLOGIC: No focal weakness or paresthesias are detected. SKIN: There are no ulcers or rashes noted. PSYCHIATRIC: The patient has a normal affect.  STUDIES:   I have ordered and reviewed his vascular lab studies.  He has normal ABIs with triphasic waveforms.  The bilateral axillofemoral bypass grafts are widely patent  MEDICAL ISSUES:   Status post bilateral axillary femoral bypass graft and removal of infected aortic graft: He continues to do remarkably well.  His bypass grafts remained widely patent.  He has normal activity levels.  We will continue with annual surveillance.    Annamarie Major, MD [facility] Specialists of [facility] [phone] Pager [phone]

## 2017-01-07 DIAGNOSIS — D649 Anemia, unspecified: Secondary | ICD-10-CM | POA: Diagnosis not present

## 2017-01-07 DIAGNOSIS — I10 Essential (primary) hypertension: Secondary | ICD-10-CM | POA: Diagnosis not present

## 2017-01-07 DIAGNOSIS — E785 Hyperlipidemia, unspecified: Secondary | ICD-10-CM | POA: Diagnosis not present

## 2017-01-13 DIAGNOSIS — D649 Anemia, unspecified: Secondary | ICD-10-CM | POA: Diagnosis not present

## 2017-01-19 DIAGNOSIS — D649 Anemia, unspecified: Secondary | ICD-10-CM | POA: Diagnosis not present

## 2017-02-05 DIAGNOSIS — E785 Hyperlipidemia, unspecified: Secondary | ICD-10-CM | POA: Diagnosis not present

## 2017-02-05 DIAGNOSIS — D509 Iron deficiency anemia, unspecified: Secondary | ICD-10-CM | POA: Diagnosis not present

## 2017-02-05 DIAGNOSIS — I10 Essential (primary) hypertension: Secondary | ICD-10-CM | POA: Diagnosis not present

## 2017-02-05 DIAGNOSIS — Z0000 Encounter for general adult medical examination without abnormal findings: Secondary | ICD-10-CM | POA: Diagnosis not present

## 2017-03-11 DIAGNOSIS — D649 Anemia, unspecified: Secondary | ICD-10-CM | POA: Diagnosis not present

## 2017-06-14 DIAGNOSIS — T63461A Toxic effect of venom of wasps, accidental (unintentional), initial encounter: Secondary | ICD-10-CM | POA: Diagnosis not present

## 2017-10-18 ENCOUNTER — Other Ambulatory Visit: Payer: Self-pay | Admitting: Dermatology

## 2017-10-18 DIAGNOSIS — L821 Other seborrheic keratosis: Secondary | ICD-10-CM | POA: Diagnosis not present

## 2017-10-18 DIAGNOSIS — C44311 Basal cell carcinoma of skin of nose: Secondary | ICD-10-CM | POA: Diagnosis not present

## 2017-10-18 DIAGNOSIS — L814 Other melanin hyperpigmentation: Secondary | ICD-10-CM | POA: Diagnosis not present

## 2017-10-18 DIAGNOSIS — L570 Actinic keratosis: Secondary | ICD-10-CM | POA: Diagnosis not present

## 2017-11-01 ENCOUNTER — Ambulatory Visit (INDEPENDENT_AMBULATORY_CARE_PROVIDER_SITE_OTHER): Payer: PPO | Admitting: Surgery

## 2017-11-01 ENCOUNTER — Ambulatory Visit (HOSPITAL_COMMUNITY)
Admission: RE | Admit: 2017-11-01 | Discharge: 2017-11-01 | Disposition: A | Discharge status: Home / Self Care | Payer: PPO | Source: Ambulatory Visit | Attending: Surgery | Admitting: Surgery

## 2017-11-01 ENCOUNTER — Encounter: Payer: Self-pay | Admitting: Surgery

## 2017-11-01 ENCOUNTER — Ambulatory Visit (INDEPENDENT_AMBULATORY_CARE_PROVIDER_SITE_OTHER)
Admission: RE | Admit: 2017-11-01 | Discharge: 2017-11-01 | Disposition: A | Discharge status: Home / Self Care | Payer: PPO | Source: Ambulatory Visit | Attending: Surgery | Admitting: Surgery

## 2017-11-01 VITALS — BP 117/71 | HR 60 | Resp 20 | Ht 69.0 in | Wt 204.6 lb

## 2017-11-01 DIAGNOSIS — Z09 Encounter for follow-up examination after completed treatment for conditions other than malignant neoplasm: Secondary | ICD-10-CM | POA: Insufficient documentation

## 2017-11-01 DIAGNOSIS — I713 Abdominal aortic aneurysm, ruptured, unspecified: Secondary | ICD-10-CM

## 2017-11-01 DIAGNOSIS — F17200 Nicotine dependence, unspecified, uncomplicated: Secondary | ICD-10-CM | POA: Diagnosis not present

## 2017-11-01 DIAGNOSIS — R9389 Abnormal findings on diagnostic imaging of other specified body structures: Secondary | ICD-10-CM | POA: Diagnosis not present

## 2017-11-01 DIAGNOSIS — I739 Peripheral vascular disease, unspecified: Secondary | ICD-10-CM | POA: Diagnosis not present

## 2017-11-01 DIAGNOSIS — R0989 Other specified symptoms and signs involving the circulatory and respiratory systems: Secondary | ICD-10-CM | POA: Diagnosis present

## 2017-11-01 NOTE — Progress Notes (Signed)
Vascular and Vein Specialist of [place]  Patient name: Gregory Hodges MRN: [id number] DOB: Apr 25, 1941 Sex: male   REASON FOR VISIT:    Follow up  [name]:     The patient is back today for followup. In 2011, October, he presented with an infected aortic graft with contained rupture. His previous repair was done at [facility]. 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 I took him emergently to the operating room and proceeded with bilateral axillary femoral bypass graft with 8 mm ring PTFE. I removed his infected graft and oversewed his aorta. I had to leave a small cuff on the distal iliac artery because it was well incorporated. I have recommended lifelong antibiotics. He is followed by Dr. Lucianne Lei Hodges with infectious disease.  He has no complaints today   PAST MEDICAL HISTORY:   Past Medical History:  Diagnosis Date  . AAA (abdominal aortic aneurysm) ([place])   . E coli bacteremia   . Hypertension   . Myocardial infarction ([place]) 2006  . Peripheral vascular disease [facility]) June 2011  . 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 No pertinent family history.  SOCIAL HISTORY:   Social History   Tobacco Use  . Smoking status: Current Every Day Smoker    Packs/day: 1.00    Years: 30.00    Pack years: 30.00    Types: Cigarettes  . Smokeless tobacco: Never Used  Substance Use Topics  . Alcohol use: Yes    Alcohol/week: 0.0 - 0.5 oz     ALLERGIES:   Allergies  Allergen Reactions  . Oxycodone     Pt states he can not take any narcotic pain pills  . Codeine Nausea And Vomiting    All Narcotics  . Oxycodone-Acetaminophen Other (See Comments)    Other Reaction: NAUSEA/VOMITING     CURRENT MEDICATIONS:   Current Outpatient Medications  Medication Sig Dispense Refill  . aspirin 81 MG tablet  Take 81 mg by mouth daily.      Marland Kitchen atenolol (TENORMIN) 25 MG tablet Take 25 mg by mouth daily.      Marland Kitchen atorvastatin (LIPITOR) 40 MG tablet Take 40 mg by mouth daily.    Marland Kitchen docusate sodium (COLACE) 50 MG capsule Take by mouth 2 (two) times daily. At bedtime     . lisinopril (PRINIVIL,ZESTRIL) 40 MG tablet Take 20 mg by mouth daily.     . mercaptopurine (PURINETHOL) 50 MG tablet Take 50 mg by mouth daily. Reported on 11/04/2015    . nystatin (MYCOSTATIN) powder Apply topically 4 (four) times daily. 30 g 0  . omeprazole (PRILOSEC) 20 MG capsule Take 20 mg by mouth daily.     . simvastatin (ZOCOR) 80 MG tablet Take 40 mg by mouth at bedtime. Reported on 11/04/2015    . sulfamethoxazole-trimethoprim (BACTRIM DS) 800-160 MG per tablet Take 1 tablet by mouth 2 (two) times daily. 800-160 mg 60 tablet 11   No current facility-administered medications for this visit.     REVIEW OF SYSTEMS:   [X]  denotes positive finding, [ ]  denotes negative finding Cardiac  Comments:  Chest pain or chest pressure:    Shortness of breath upon exertion:    Short of breath when lying flat:    Irregular heart rhythm:        Vascular    Pain in calf, thigh,  or hip brought on by ambulation:    Pain in feet at night that wakes you up from your sleep:     Blood clot in your veins:    Leg swelling:         Pulmonary    Oxygen at home:    Productive cough:     Wheezing:         Neurologic    Sudden weakness in arms or legs:     Sudden numbness in arms or legs:     Sudden onset of difficulty speaking or slurred speech:    Temporary loss of vision in one eye:     Problems with dizziness:         Gastrointestinal    Blood in stool:     Vomited blood:         Genitourinary    Burning when urinating:     Blood in urine:        Psychiatric    Major depression:         Hematologic    Bleeding problems:    Problems with blood clotting too easily:        Skin    Rashes or ulcers:        Constitutional      Fever or chills:      PHYSICAL EXAM:   Vitals:   11/01/17 0922  BP: 117/71  Pulse: 60  Resp: 20  SpO2: 91%  Weight: 204 lb 9.6 oz (92.8 kg)  Height: 5' 9"  (1.753 m)    GENERAL: The patient is a well-nourished male, in no acute distress. The vital signs are documented above. CARDIAC: There is a regular rate and rhythm.  PULMONARY: Non-labored respirations ABDOMEN: Abdominal wall hernia.  MUSCULOSKELETAL: There are no major deformities or cyanosis. NEUROLOGIC: No focal weakness or paresthesias are detected. SKIN: There are no ulcers or rashes noted. PSYCHIATRIC: The patient has a normal affect.  STUDIES:   I have reviewed his vascular lab studies.  The bilateral axillary femoral bypass grafts are widely patent with no evidence of stenosis.  He has normal ABIs with triphasic waveforms  MEDICAL ISSUES:   Follow-up in 1 year with repeat duplex.    Annamarie Major, MD [facility] Specialists of [facility] [phone] Pager [phone]

## 2017-11-15 ENCOUNTER — Encounter (HOSPITAL_COMMUNITY): Payer: PPO

## 2017-11-15 ENCOUNTER — Ambulatory Visit: Payer: PPO | Admitting: Surgery

## 2017-12-13 DIAGNOSIS — H2513 Age-related nuclear cataract, bilateral: Secondary | ICD-10-CM | POA: Diagnosis not present

## 2017-12-13 DIAGNOSIS — H43811 Vitreous degeneration, right eye: Secondary | ICD-10-CM | POA: Diagnosis not present

## 2017-12-13 DIAGNOSIS — D3132 Benign neoplasm of left choroid: Secondary | ICD-10-CM | POA: Diagnosis not present

## 2018-02-17 DIAGNOSIS — Z1211 Encounter for screening for malignant neoplasm of colon: Secondary | ICD-10-CM | POA: Diagnosis not present

## 2018-02-17 DIAGNOSIS — Z0000 Encounter for general adult medical examination without abnormal findings: Secondary | ICD-10-CM | POA: Diagnosis not present

## 2018-02-17 DIAGNOSIS — I10 Essential (primary) hypertension: Secondary | ICD-10-CM | POA: Diagnosis not present

## 2018-02-17 DIAGNOSIS — E785 Hyperlipidemia, unspecified: Secondary | ICD-10-CM | POA: Diagnosis not present

## 2018-12-05 ENCOUNTER — Other Ambulatory Visit: Payer: Self-pay

## 2018-12-05 DIAGNOSIS — I739 Peripheral vascular disease, unspecified: Secondary | ICD-10-CM

## 2018-12-05 DIAGNOSIS — I713 Abdominal aortic aneurysm, ruptured, unspecified: Secondary | ICD-10-CM

## 2018-12-12 ENCOUNTER — Other Ambulatory Visit: Payer: Self-pay

## 2018-12-12 ENCOUNTER — Ambulatory Visit (HOSPITAL_COMMUNITY)
Admission: RE | Admit: 2018-12-12 | Discharge: 2018-12-12 | Disposition: A | Discharge status: Home / Self Care | Payer: PPO | Source: Ambulatory Visit | Attending: Family | Admitting: Family

## 2018-12-12 ENCOUNTER — Encounter: Payer: Self-pay | Admitting: Surgery

## 2018-12-12 ENCOUNTER — Ambulatory Visit (INDEPENDENT_AMBULATORY_CARE_PROVIDER_SITE_OTHER)
Admission: RE | Admit: 2018-12-12 | Discharge: 2018-12-12 | Disposition: A | Discharge status: Home / Self Care | Payer: PPO | Source: Ambulatory Visit | Attending: Family | Admitting: Family

## 2018-12-12 ENCOUNTER — Ambulatory Visit: Payer: PPO | Admitting: Surgery

## 2018-12-12 VITALS — BP 119/77 | HR 70 | Temp 98.9°F | Resp 20 | Ht 69.0 in | Wt 196.5 lb

## 2018-12-12 DIAGNOSIS — I713 Abdominal aortic aneurysm, ruptured, unspecified: Secondary | ICD-10-CM

## 2018-12-12 DIAGNOSIS — I739 Peripheral vascular disease, unspecified: Secondary | ICD-10-CM

## 2018-12-12 NOTE — Progress Notes (Signed)
Vascular and Vein Specialist of [place]  Patient name: Gregory Hodges MRN: [id number] DOB: 1941-07-09 Sex: male   REASON FOR VISIT:    Follow up  [place]:   The patient is back today for followup. In 2011, October, he presented with an infected aortic graft with contained rupture. His previous repair was done at [facility]. 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 I took him emergently to the operating room and proceeded with bilateral axillary femoral bypass graft with 8 mm ring PTFE. I removed his infected graft and oversewed his aorta. I had to leave a small cuff on the distal iliac artery because it was well incorporated. I have recommended lifelong antibiotics. 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   He has no complaints today  PAST MEDICAL HISTORY:   Past Medical History:  Diagnosis Date  . AAA (abdominal aortic aneurysm) ([place])   . E coli bacteremia   . Hypertension   . Myocardial infarction ([place]) 2006  . Peripheral vascular disease [facility]) June 2011  . 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 No pertinent family history.  SOCIAL HISTORY:   Social History   Tobacco Use  . Smoking status: Current Every Day Smoker    Packs/day: 1.00    Years: 30.00    Pack years: 30.00    Types: Cigarettes  . Smokeless tobacco: Never Used  Substance Use Topics  . Alcohol use: Yes    Alcohol/week: 0.0 - 1.0 standard drinks     ALLERGIES:   Allergies  Allergen Reactions  . Oxycodone     Pt states he can not take any narcotic pain pills  . Codeine Nausea And Vomiting    All Narcotics  . Oxycodone-Acetaminophen Other (See Comments)    Other Reaction: NAUSEA/VOMITING     CURRENT MEDICATIONS:   Current Outpatient Medications  Medication Sig Dispense Refill  . aspirin 81 MG  tablet Take 81 mg by mouth daily.      Marland Kitchen atenolol (TENORMIN) 25 MG tablet Take 25 mg by mouth daily.      Marland Kitchen atorvastatin (LIPITOR) 40 MG tablet Take 40 mg by mouth daily.    Marland Kitchen omeprazole (PRILOSEC) 20 MG capsule Take 20 mg by mouth daily.     . simvastatin (ZOCOR) 80 MG tablet Take 40 mg by mouth at bedtime. Reported on 11/04/2015    . sulfamethoxazole-trimethoprim (BACTRIM DS) 800-160 MG per tablet Take 1 tablet by mouth 2 (two) times daily. 800-160 mg 60 tablet 11   No current facility-administered medications for this visit.     REVIEW OF SYSTEMS:   [X]  denotes positive finding, [ ]  denotes negative finding Cardiac  Comments:  Chest pain or chest pressure:    Shortness of breath upon exertion:    Short of breath when lying flat:    Irregular heart rhythm:        Vascular    Pain in calf, thigh, or hip brought on by ambulation:    Pain in feet at night that wakes you up from your sleep:     Blood clot in your veins:    Leg swelling:         Pulmonary    Oxygen at home:    Productive cough:     Wheezing:         Neurologic  Sudden weakness in arms or legs:     Sudden numbness in arms or legs:     Sudden onset of difficulty speaking or slurred speech:    Temporary loss of vision in one eye:     Problems with dizziness:         Gastrointestinal    Blood in stool:     Vomited blood:         Genitourinary    Burning when urinating:     Blood in urine:        Psychiatric    Major depression:         Hematologic    Bleeding problems:    Problems with blood clotting too easily:        Skin    Rashes or ulcers:        Constitutional    Fever or chills:      PHYSICAL EXAM:   Vitals:   12/12/18 1416  BP: 119/77  Pulse: 70  Resp: 20  Temp: 98.9 F (37.2 C)  SpO2: 93%  Weight: 89.1 kg  Height: 5' 9"  (1.753 m)    GENERAL: The patient is a well-nourished male, in no acute distress. The vital signs are documented above. CARDIAC: There is a regular rate and  rhythm.  PULMONARY: Non-labored respirations ABDOMEN: Ventral hernia MUSCULOSKELETAL: There are no major deformities or cyanosis. NEUROLOGIC: No focal weakness or paresthesias are detected. SKIN: There are no ulcers or rashes noted. PSYCHIATRIC: The patient has a normal affect.  STUDIES:   I have ordered and reviewed his ultra sound:  Right: No significant change compared to previous study.  Right Graft(s): AX-FEM bypass graft patent with no evidence of stenosis noted. Left: No significant change as compared to previous study.  Left Graft(s): AX-FEM bypass graft patent with no evidence of stenosis noted.   +-------+-----------+-----------+------------+------------+ ABI/TBIToday's ABIToday's TBIPrevious ABIPrevious TBI +-------+-----------+-----------+------------+------------+ Right  0.83       0.50       0.97        0.41         +-------+-----------+-----------+------------+------------+ Left   0.95       0.72       0.97        0.61         +-------+-----------+-----------+------------+------------+    MEDICAL ISSUES:   Patient remained stable.  He will follow-up in 1 year with repeat studies    Annamarie Major, IV, MD, FACS [facility] Vein Specialists of [facility] [phone] Pager [phone]

## 2019-06-01 DIAGNOSIS — I2510 Atherosclerotic heart disease of native coronary artery without angina pectoris: Secondary | ICD-10-CM | POA: Diagnosis not present

## 2019-06-01 DIAGNOSIS — Z0000 Encounter for general adult medical examination without abnormal findings: Secondary | ICD-10-CM | POA: Diagnosis not present

## 2019-06-01 DIAGNOSIS — E785 Hyperlipidemia, unspecified: Secondary | ICD-10-CM | POA: Diagnosis not present

## 2019-06-01 DIAGNOSIS — I10 Essential (primary) hypertension: Secondary | ICD-10-CM | POA: Diagnosis not present

## 2019-06-01 DIAGNOSIS — D509 Iron deficiency anemia, unspecified: Secondary | ICD-10-CM | POA: Diagnosis not present

## 2019-06-01 DIAGNOSIS — K432 Incisional hernia without obstruction or gangrene: Secondary | ICD-10-CM | POA: Diagnosis not present

## 2019-06-16 DIAGNOSIS — Z0000 Encounter for general adult medical examination without abnormal findings: Secondary | ICD-10-CM | POA: Diagnosis not present

## 2019-06-16 DIAGNOSIS — D509 Iron deficiency anemia, unspecified: Secondary | ICD-10-CM | POA: Diagnosis not present

## 2019-06-16 DIAGNOSIS — E785 Hyperlipidemia, unspecified: Secondary | ICD-10-CM | POA: Diagnosis not present

## 2019-10-17 ENCOUNTER — Ambulatory Visit: Payer: PPO | Attending: Internal Medicine

## 2019-10-17 DIAGNOSIS — Z23 Encounter for immunization: Secondary | ICD-10-CM | POA: Insufficient documentation

## 2019-10-17 NOTE — Progress Notes (Signed)
   Covid-19 Vaccination Clinic  Name:  Gregory Hodges    MRN: [id number] DOB: [birth year]  10/17/2019  [name] was observed post Covid-19 immunization for 15 minutes without incidence. He was provided with Vaccine Information Sheet and instruction to access the V-Safe system.   [name] was instructed to call 911 with any severe reactions post vaccine: Marland Kitchen Difficulty breathing  . Swelling of your face and throat  . A fast heartbeat  . A bad rash all over your body  . Dizziness and weakness    Immunizations Administered    Name Date Dose VIS Date Route   Pfizer COVID-19 Vaccine 10/17/2019 11:46 AM 0.3 mL 09/08/2019 Intramuscular   Manufacturer: [place]   Lot: [id number]   [place]: [id number]

## 2019-11-06 ENCOUNTER — Ambulatory Visit: Payer: PPO | Attending: Internal Medicine

## 2019-11-06 DIAGNOSIS — Z23 Encounter for immunization: Secondary | ICD-10-CM | POA: Insufficient documentation

## 2019-11-06 NOTE — Progress Notes (Signed)
   Covid-19 Vaccination Clinic  Name:  [name]    MRN: [id number] DOB: 02-May-1941  11/06/2019  [name] was observed post Covid-19 immunization for 15 minutes without incidence. He was provided with Vaccine Information Sheet and instruction to access the V-Safe system.   [name] was instructed to call 911 with any severe reactions post vaccine: Marland Kitchen Difficulty breathing  . Swelling of your face and throat  . A fast heartbeat  . A bad rash all over your body  . Dizziness and weakness    Immunizations Administered    Name Date Dose VIS Date Route   Pfizer COVID-19 Vaccine 11/06/2019  9:29 AM 0.3 mL 09/08/2019 Intramuscular   Manufacturer: [place]   Lot: [id number]   [place]: [id number]

## 2019-12-27 ENCOUNTER — Other Ambulatory Visit: Payer: Self-pay | Admitting: *Deleted

## 2019-12-27 DIAGNOSIS — I713 Abdominal aortic aneurysm, ruptured, unspecified: Secondary | ICD-10-CM

## 2019-12-27 DIAGNOSIS — T827XXD Infection and inflammatory reaction due to other cardiac and vascular devices, implants and grafts, subsequent encounter: Secondary | ICD-10-CM

## 2019-12-27 DIAGNOSIS — I739 Peripheral vascular disease, unspecified: Secondary | ICD-10-CM

## 2020-01-01 ENCOUNTER — Encounter: Payer: Self-pay | Admitting: Surgery

## 2020-01-01 ENCOUNTER — Ambulatory Visit (HOSPITAL_COMMUNITY)
Admission: RE | Admit: 2020-01-01 | Discharge: 2020-01-01 | Disposition: A | Discharge status: Home / Self Care | Payer: PPO | Source: Ambulatory Visit | Attending: Surgery | Admitting: Surgery

## 2020-01-01 ENCOUNTER — Ambulatory Visit (INDEPENDENT_AMBULATORY_CARE_PROVIDER_SITE_OTHER)
Admission: RE | Admit: 2020-01-01 | Discharge: 2020-01-01 | Disposition: A | Discharge status: Home / Self Care | Payer: PPO | Source: Ambulatory Visit | Attending: Surgery | Admitting: Surgery

## 2020-01-01 ENCOUNTER — Ambulatory Visit: Payer: PPO | Admitting: Surgery

## 2020-01-01 ENCOUNTER — Other Ambulatory Visit: Payer: Self-pay

## 2020-01-01 VITALS — BP 147/72 | HR 66 | Temp 99.5°F | Resp 18 | Ht 69.0 in | Wt 192.0 lb

## 2020-01-01 DIAGNOSIS — I713 Abdominal aortic aneurysm, ruptured, unspecified: Secondary | ICD-10-CM

## 2020-01-01 DIAGNOSIS — I739 Peripheral vascular disease, unspecified: Secondary | ICD-10-CM | POA: Insufficient documentation

## 2020-01-01 DIAGNOSIS — T827XXD Infection and inflammatory reaction due to other cardiac and vascular devices, implants and grafts, subsequent encounter: Secondary | ICD-10-CM | POA: Diagnosis not present

## 2020-01-01 NOTE — Progress Notes (Signed)
Vascular and Vein Specialist of [place]  Patient name: Gregory Hodges MRN: [id number] DOB: 18-May-1941 Sex: male   REASON FOR VISIT:    Follow up  [place]:   The patient is back today for followup. 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 His previous repair was done at [facility]. 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 I took him emergently to the operating room and proceeded with bilateral axillary femoral bypass graft with 8 mm ring PTFE. I removed his infected graft and oversewed his aorta. I had to leave a small cuff on the distal iliac artery because it was well incorporated. I have recommended lifelong antibiotics. 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   He has no complaints today.  He has a history of MI.  HE takes a statin for hypercholesterolemia.  He is a smoker   PAST MEDICAL HISTORY:   Past Medical History:  Diagnosis Date  . AAA (abdominal aortic aneurysm) ([place])   . E coli bacteremia   . Hypertension   . Myocardial infarction ([place]) 2006  . Peripheral vascular disease [facility]) June 2011  . 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 No pertinent family history.  SOCIAL HISTORY:   Social History   Tobacco Use  . Smoking status: Current Every Day Smoker    Packs/day: 1.00    Years: 30.00    Pack years: 30.00    Types: Cigarettes  . Smokeless tobacco: Never Used  Substance Use Topics  . Alcohol use: Yes    Alcohol/week: 0.0 - 1.0 standard drinks     ALLERGIES:   Allergies  Allergen Reactions  . Oxycodone     Pt states he can not take any narcotic pain pills  . Codeine Nausea And Vomiting    All Narcotics  . Oxycodone-Acetaminophen Other (See Comments)    Other Reaction: NAUSEA/VOMITING      CURRENT MEDICATIONS:   Current Outpatient Medications  Medication Sig Dispense Refill  . aspirin 81 MG tablet Take 81 mg by mouth daily.      Marland Kitchen atenolol (TENORMIN) 25 MG tablet Take 25 mg by mouth daily.      Marland Kitchen atorvastatin (LIPITOR) 40 MG tablet Take 40 mg by mouth daily.    Marland Kitchen omeprazole (PRILOSEC) 20 MG capsule Take 20 mg by mouth daily.     . simvastatin (ZOCOR) 80 MG tablet Take 40 mg by mouth at bedtime. Reported on 11/04/2015    . sulfamethoxazole-trimethoprim (BACTRIM DS) 800-160 MG per tablet Take 1 tablet by mouth 2 (two) times daily. 800-160 mg (Patient not taking: Reported on 01/01/2020) 60 tablet 11   No current facility-administered medications for this visit.    REVIEW OF SYSTEMS:   [X]  denotes positive finding, [ ]  denotes negative finding Cardiac  Comments:  Chest pain or chest pressure:    Shortness of breath upon exertion:    Short of breath when lying flat:    Irregular heart rhythm:        Vascular    Pain in calf, thigh, or hip brought on by ambulation:    Pain in feet at night that wakes you up from your sleep:     Blood clot in your veins:    Leg swelling:  Pulmonary    Oxygen at home:    Productive cough:     Wheezing:         Neurologic    Sudden weakness in arms or legs:     Sudden numbness in arms or legs:     Sudden onset of difficulty speaking or slurred speech:    Temporary loss of vision in one eye:     Problems with dizziness:         Gastrointestinal    Blood in stool:     Vomited blood:         Genitourinary    Burning when urinating:     Blood in urine:        Psychiatric    Major depression:         Hematologic    Bleeding problems:    Problems with blood clotting too easily:        Skin    Rashes or ulcers:        Constitutional    Fever or chills:      PHYSICAL EXAM:   Vitals:   01/01/20 0959  BP: (!) 147/72  Pulse: 66  Resp: 18  Temp: 99.5 F (37.5 C)  TempSrc: Temporal  SpO2: 94%  Weight: 192  lb (87.1 kg)  Height: 5' 9"  (1.753 m)    GENERAL: The patient is a well-nourished male, in no acute distress. The vital signs are documented above. CARDIAC: There is a regular rate and rhythm.  VASCULAR: I could not palpate a right pedal pulse.  He does have a palpable left posterior tibial PULMONARY: Non-labored respirations ABDOMEN: Soft and non-tender with ventral incisional hernia MUSCULOSKELETAL: There are no major deformities or cyanosis. NEUROLOGIC: No focal weakness or paresthesias are detected. SKIN: There are no ulcers or rashes noted. PSYCHIATRIC: The patient has a normal affect.  STUDIES:   I have reviewed the following: Axillary bypass duplex: Right: Patent ax-fem BPG with no evidence of stenosis.   Left: Patent ax-fem BPG with no evidence of stenosis.  +-------+-----------+-----------+------------+------------+  ABI/TBIToday's ABIToday's TBIPrevious ABIPrevious TBI  +-------+-----------+-----------+------------+------------+  Right 1.04    0.81    0.83    0.5       +-------+-----------+-----------+------------+------------+  Left  1.08    0.71    0.95    0.72      +-------+-----------+-----------+------------+------------+    MEDICAL ISSUES:   The patient has done well over the past year with no significant interval change.  I will continue with regular annual surveillance follow-up   Leia Alf, MD, FACS Vascular and Vein Specialists of [facility] [phone] Pager [phone]

## 2020-01-03 ENCOUNTER — Other Ambulatory Visit: Payer: Self-pay | Admitting: *Deleted

## 2020-01-03 DIAGNOSIS — I713 Abdominal aortic aneurysm, ruptured, unspecified: Secondary | ICD-10-CM

## 2020-01-03 DIAGNOSIS — I739 Peripheral vascular disease, unspecified: Secondary | ICD-10-CM

## 2020-02-12 DIAGNOSIS — R110 Nausea: Secondary | ICD-10-CM | POA: Diagnosis not present

## 2020-02-12 DIAGNOSIS — D509 Iron deficiency anemia, unspecified: Secondary | ICD-10-CM | POA: Diagnosis not present

## 2020-02-12 DIAGNOSIS — E785 Hyperlipidemia, unspecified: Secondary | ICD-10-CM | POA: Diagnosis not present

## 2020-07-02 DIAGNOSIS — D509 Iron deficiency anemia, unspecified: Secondary | ICD-10-CM | POA: Diagnosis not present

## 2020-07-02 DIAGNOSIS — I10 Essential (primary) hypertension: Secondary | ICD-10-CM | POA: Diagnosis not present

## 2020-07-02 DIAGNOSIS — Z0000 Encounter for general adult medical examination without abnormal findings: Secondary | ICD-10-CM | POA: Diagnosis not present

## 2020-07-02 DIAGNOSIS — E785 Hyperlipidemia, unspecified: Secondary | ICD-10-CM | POA: Diagnosis not present

## 2020-07-02 DIAGNOSIS — I2510 Atherosclerotic heart disease of native coronary artery without angina pectoris: Secondary | ICD-10-CM | POA: Diagnosis not present

## 2020-10-07 ENCOUNTER — Telehealth: Payer: Self-pay

## 2020-10-07 ENCOUNTER — Other Ambulatory Visit: Payer: Self-pay

## 2020-10-07 ENCOUNTER — Other Ambulatory Visit (HOSPITAL_COMMUNITY): Payer: Self-pay | Admitting: Surgery

## 2020-10-07 ENCOUNTER — Ambulatory Visit (HOSPITAL_COMMUNITY)
Admission: RE | Admit: 2020-10-07 | Discharge: 2020-10-07 | Disposition: A | Discharge status: Home / Self Care | Payer: PPO | Source: Ambulatory Visit | Attending: Surgery | Admitting: Surgery

## 2020-10-07 ENCOUNTER — Encounter: Payer: Self-pay | Admitting: Surgery

## 2020-10-07 ENCOUNTER — Ambulatory Visit: Payer: PPO | Admitting: Surgery

## 2020-10-07 VITALS — BP 122/77 | HR 67 | Temp 98.0°F | Resp 20 | Ht 69.0 in | Wt 182.5 lb

## 2020-10-07 DIAGNOSIS — M79604 Pain in right leg: Secondary | ICD-10-CM

## 2020-10-07 DIAGNOSIS — Z09 Encounter for follow-up examination after completed treatment for conditions other than malignant neoplasm: Secondary | ICD-10-CM

## 2020-10-07 DIAGNOSIS — I739 Peripheral vascular disease, unspecified: Secondary | ICD-10-CM | POA: Diagnosis not present

## 2020-10-07 NOTE — Telephone Encounter (Signed)
Patient has yearly surveillance after complicated AAA repair in 2011. He called c/o R leg pain that has been ongoing for a week weeks. It feels "like hard pain, sort of like a sore muscle." He is taking tylenol and using a heating pad which sometimes helps. Denies temperature change or swelling in the affected leg. Does endorse some lower back pain. He has stopped going to the health club in case it is a pulled muscle. Placed on VWB schedule today with ABIs and advised patient to make appt with PCP.

## 2020-10-07 NOTE — Progress Notes (Signed)
Vascular and Vein Specialist of [place]  Patient name: Gregory Hodges MRN: [id number] DOB: [birth year] Sex: male   REASON FOR VISIT:    Follow up  HISOTRY OF PRESENT ILLNESS:    Gregory Hodges is a 80 y.o. male who is here for a unscheduled visit.  I had previously performed bilateral axillary femoral bypass grafts for a repair of an infected ruptured aneurysm.  He states that for the past several weeks he has been having right back, thigh calf and leg pain that can be somewhat debilitating.  There are no aggravating factors.  It does not occur with activity but it can.  It keeps him up sometimes at night.   PAST MEDICAL HISTORY:   Past Medical History:  Diagnosis Date  . AAA (abdominal aortic aneurysm) ([place])   . E coli bacteremia   . Hypertension   . Myocardial infarction ([place]) 2006  . Peripheral vascular disease [facility]) June 2011  . 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 No pertinent family history.  SOCIAL HISTORY:   Social History   Tobacco Use  . Smoking status: Current Every Day Smoker    Packs/day: 1.00    Years: 30.00    Pack years: 30.00    Types: Cigarettes  . Smokeless tobacco: Never Used  Substance Use Topics  . Alcohol use: Yes    Alcohol/week: 0.0 - 1.0 standard drinks     ALLERGIES:   Allergies  Allergen Reactions  . Oxycodone     Pt states he can not take any narcotic pain pills  . Codeine Nausea And Vomiting    All Narcotics  . Oxycodone-Acetaminophen Other (See Comments)    Other Reaction: NAUSEA/VOMITING     CURRENT MEDICATIONS:   Current Outpatient Medications  Medication Sig Dispense Refill  . aspirin 81 MG tablet Take 81 mg by mouth daily.      Marland Kitchen atenolol (TENORMIN) 25 MG tablet Take 25 mg by mouth daily.      Marland Kitchen atorvastatin (LIPITOR) 40 MG tablet Take 40 mg by mouth daily.    Marland Kitchen omeprazole (PRILOSEC) 20 MG capsule Take 20 mg by mouth daily.     . simvastatin  (ZOCOR) 80 MG tablet Take 40 mg by mouth at bedtime. Reported on 11/04/2015    . sulfamethoxazole-trimethoprim (BACTRIM DS) 800-160 MG per tablet Take 1 tablet by mouth 2 (two) times daily. 800-160 mg (Patient not taking: Reported on 01/01/2020) 60 tablet 11   No current facility-administered medications for this visit.    REVIEW OF SYSTEMS:   [X]  denotes positive finding, [ ]  denotes negative finding Cardiac  Comments:  Chest pain or chest pressure:    Shortness of breath upon exertion:    Short of breath when lying flat:    Irregular heart rhythm:        Vascular    Pain in calf, thigh, or hip brought on by ambulation:    Pain in feet at night that wakes you up from your sleep:     Blood clot in your veins:    Leg swelling:         Pulmonary    Oxygen at home:    Productive cough:     Wheezing:         Neurologic    Sudden weakness in arms or legs:     Sudden numbness in arms or legs:     Sudden onset of difficulty speaking or  slurred speech:    Temporary loss of vision in one eye:     Problems with dizziness:         Gastrointestinal    Blood in stool:     Vomited blood:         Genitourinary    Burning when urinating:     Blood in urine:        Psychiatric    Major depression:         Hematologic    Bleeding problems:    Problems with blood clotting too easily:        Skin    Rashes or ulcers:        Constitutional    Fever or chills:      PHYSICAL EXAM:   Vitals:   10/07/20 1334  BP: 122/77  Pulse: 67  Resp: 20  Temp: 98 F (36.7 C)  SpO2: 93%  Weight: 182 lb 8 oz (82.8 kg)  Height: 5' 9"  (1.753 m)    GENERAL: The patient is a well-nourished male, in no acute distress. The vital signs are documented above. CARDIAC: There is a regular rate and rhythm.  VASCULAR: Palpable pedal pulses bilaterally.  Palpable femoral pulses.  There is no evidence of pseudoaneurysm in the right groin or any kind of groin complications PULMONARY: Non-labored  respirations ABDOMEN: Soft and non-tender with normal pitched bowel sounds.  MUSCULOSKELETAL: There are no major deformities or cyanosis. NEUROLOGIC: No focal weakness or paresthesias are detected. SKIN: There are no ulcers or rashes noted. PSYCHIATRIC: The patient has a normal affect.  STUDIES:   I have reviewed his u/s with the following results  +-------+-----------+-----------+------------+------------+  ABI/TBIToday's ABIToday's TBIPrevious ABIPrevious TBI  +-------+-----------+-----------+------------+------------+  Right 0.97    0.48    1.04    0.81      +-------+-----------+-----------+------------+------------+  Left  1.03    0.76    1.08    0.71      +-------+-----------+-----------+------------+------------+  MEDICAL ISSUES:   Right leg pain: I do not think that this is vascular in etiology given normal ABIs, palpable femoral and pedal pulses.  I suspect this is a lower back issue.  I have asked him to discuss this with Dr. Orland Mustard his primary care doctor.  He will follow-up with me at his regular scheduled visit.    Leia Alf, MD, FACS Vascular and Vein Specialists of [facility] [phone] Pager [phone]

## 2020-10-08 DIAGNOSIS — M549 Dorsalgia, unspecified: Secondary | ICD-10-CM | POA: Diagnosis not present

## 2020-10-08 DIAGNOSIS — M79604 Pain in right leg: Secondary | ICD-10-CM | POA: Diagnosis not present

## 2020-10-09 ENCOUNTER — Telehealth: Payer: Self-pay

## 2020-10-09 DIAGNOSIS — M545 Low back pain, unspecified: Secondary | ICD-10-CM | POA: Diagnosis not present

## 2020-10-09 DIAGNOSIS — M25551 Pain in right hip: Secondary | ICD-10-CM | POA: Diagnosis not present

## 2020-10-09 NOTE — Telephone Encounter (Signed)
Patient left VM to ask if he had aneurysm clips as he needs to undergo MRI. Patient had surgery in 2011 for infected aortic graft. Pulled patient's paper record, previous graft was removed and 19m PTFE graft placed. Found no evidence of anything that would be unsafe for MRI - left VM for patient.

## 2020-10-11 DIAGNOSIS — M545 Low back pain, unspecified: Secondary | ICD-10-CM | POA: Diagnosis not present

## 2020-10-15 ENCOUNTER — Telehealth: Payer: Self-pay

## 2020-10-15 NOTE — Telephone Encounter (Signed)
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 He will be seeing Dr. Zada Finders for follow up. Advised him I would place a note in his chart and call for sooner follow up with [facility] than June if need be.

## 2020-10-16 ENCOUNTER — Other Ambulatory Visit: Payer: Self-pay

## 2020-10-16 ENCOUNTER — Other Ambulatory Visit: Payer: Self-pay | Admitting: Neurological Surgery

## 2020-10-16 ENCOUNTER — Other Ambulatory Visit (HOSPITAL_COMMUNITY): Payer: Self-pay | Admitting: Neurological Surgery

## 2020-10-16 ENCOUNTER — Ambulatory Visit (HOSPITAL_COMMUNITY)
Admission: RE | Admit: 2020-10-16 | Discharge: 2020-10-16 | Disposition: A | Discharge status: Home / Self Care | Payer: PPO | Source: Ambulatory Visit | Attending: Neurological Surgery | Admitting: Neurological Surgery

## 2020-10-16 DIAGNOSIS — S2243XA Multiple fractures of ribs, bilateral, initial encounter for closed fracture: Secondary | ICD-10-CM | POA: Insufficient documentation

## 2020-10-16 DIAGNOSIS — S2232XA Fracture of one rib, left side, initial encounter for closed fracture: Secondary | ICD-10-CM | POA: Diagnosis not present

## 2020-10-16 DIAGNOSIS — X58XXXA Exposure to other specified factors, initial encounter: Secondary | ICD-10-CM | POA: Diagnosis not present

## 2020-10-16 DIAGNOSIS — D492 Neoplasm of unspecified behavior of bone, soft tissue, and skin: Secondary | ICD-10-CM | POA: Insufficient documentation

## 2020-10-16 DIAGNOSIS — S2231XA Fracture of one rib, right side, initial encounter for closed fracture: Secondary | ICD-10-CM | POA: Diagnosis not present

## 2020-10-16 DIAGNOSIS — R590 Localized enlarged lymph nodes: Secondary | ICD-10-CM | POA: Insufficient documentation

## 2020-10-16 DIAGNOSIS — N200 Calculus of kidney: Secondary | ICD-10-CM | POA: Insufficient documentation

## 2020-10-16 DIAGNOSIS — Z6826 Body mass index (BMI) 26.0-26.9, adult: Secondary | ICD-10-CM | POA: Diagnosis not present

## 2020-10-16 DIAGNOSIS — S2249XA Multiple fractures of ribs, unspecified side, initial encounter for closed fracture: Secondary | ICD-10-CM | POA: Diagnosis not present

## 2020-10-16 DIAGNOSIS — K5730 Diverticulosis of large intestine without perforation or abscess without bleeding: Secondary | ICD-10-CM | POA: Diagnosis not present

## 2020-10-16 DIAGNOSIS — K5790 Diverticulosis of intestine, part unspecified, without perforation or abscess without bleeding: Secondary | ICD-10-CM | POA: Insufficient documentation

## 2020-10-16 DIAGNOSIS — K449 Diaphragmatic hernia without obstruction or gangrene: Secondary | ICD-10-CM | POA: Diagnosis not present

## 2020-10-16 DIAGNOSIS — N281 Cyst of kidney, acquired: Secondary | ICD-10-CM | POA: Diagnosis not present

## 2020-10-16 LAB — POCT I-STAT CREATININE: Creatinine, Ser: 1.5 mg/dL — ABNORMAL HIGH (ref 0.61–1.24)

## 2020-10-16 IMAGING — CT CT CHEST-ABD-PELV W/ CM
2 of 5 series · 12 of 36 positions shown, 14 images · IV contrast (APPLIED)
Comparison: MRI from [DATE], CT from [DATE].

CLINICAL DATA: L5 replacement, possible metastatic disease

EXAM:
CT CHEST, ABDOMEN, AND PELVIS WITH CONTRAST
TECHNIQUE: Multidetector CT imaging of the chest, abdomen and pelvis was
performed following the standard protocol during bolus
administration of intravenous contrast.
CONTRAST:  100mL OMNIPAQUE IOHEXOL 300 MG/ML  SOLN

[Series 2: cap with · axial · 0.88mm/px · z∈[-417,+123]mm · 9 of 136 slices shown, 11 images]
[im 14/136  mediastinal]
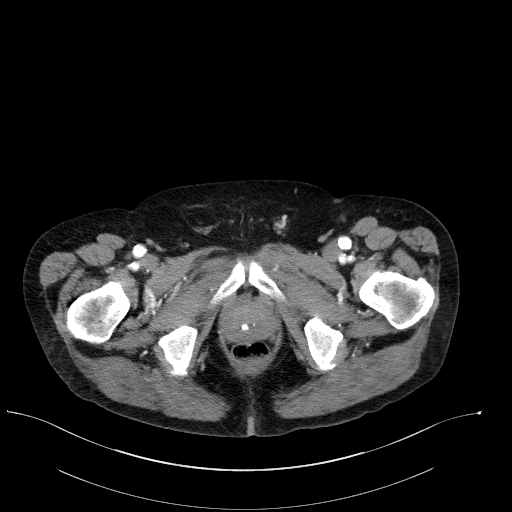
[im 14/136  bone]
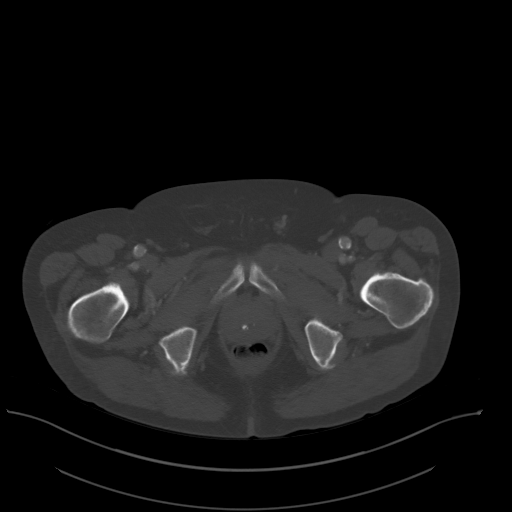
[im 28/136  mediastinal]
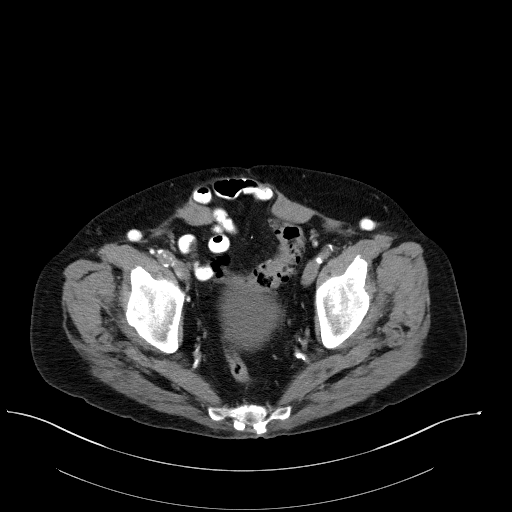
[im 41/136  mediastinal]
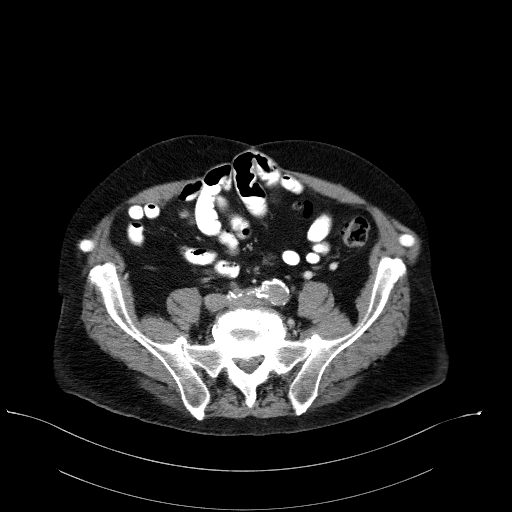
[im 55/136  mediastinal]
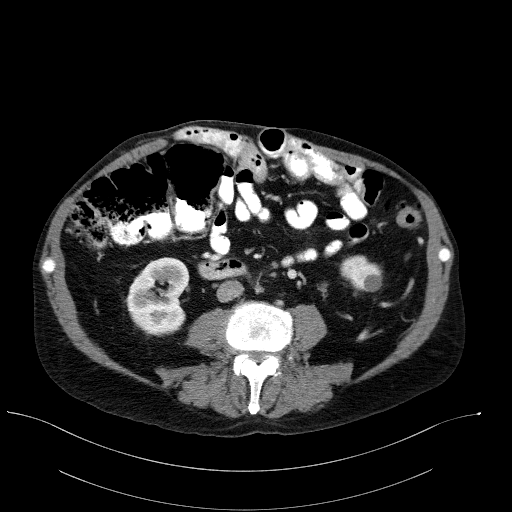
[im 68/136  mediastinal]
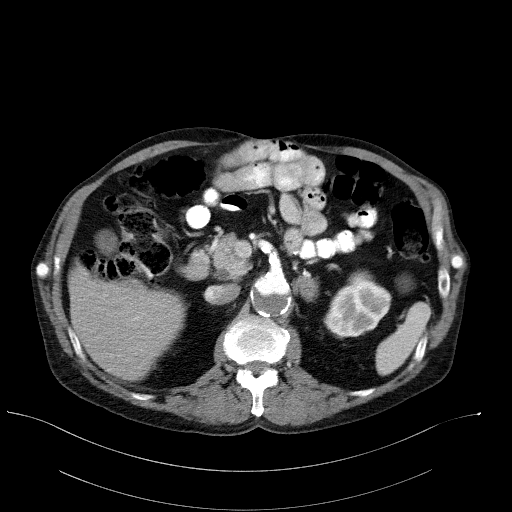
[im 82/136  mediastinal]
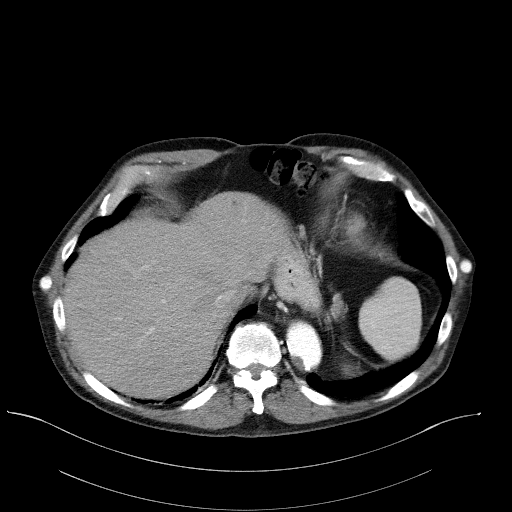
[im 95/136  mediastinal]
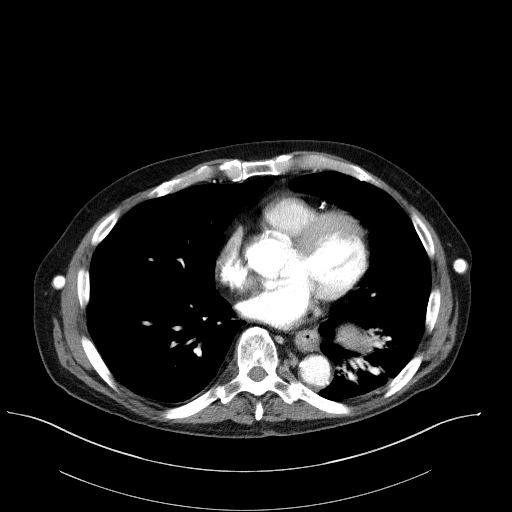
[im 109/136  mediastinal]
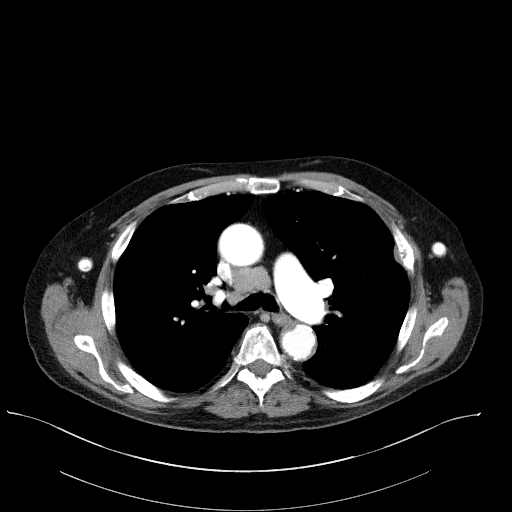
[im 122/136  mediastinal]
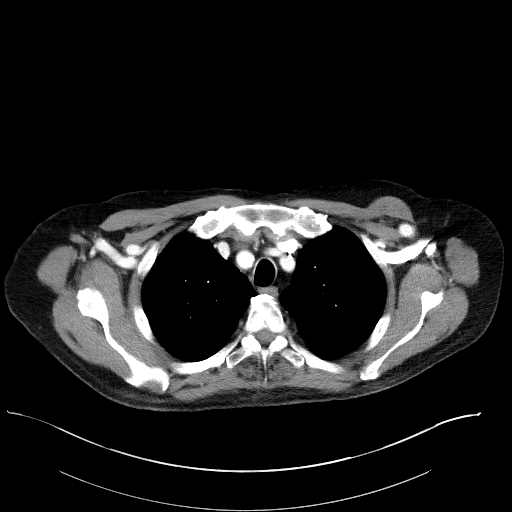
[im 122/136  bone]
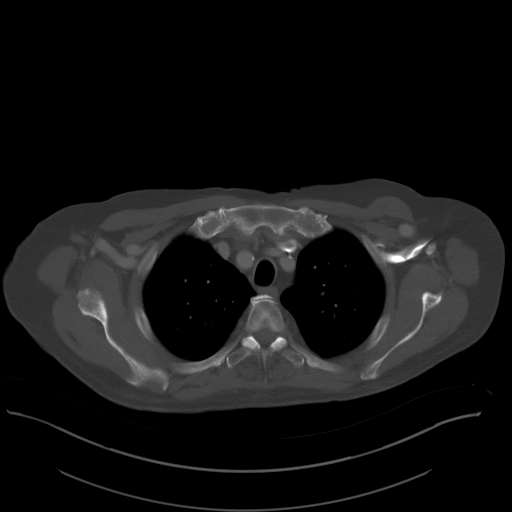

[Series 5: coronals · coronal · 0.87mm/px · 3 of 151 slices shown]
[im 31/151  mediastinal]
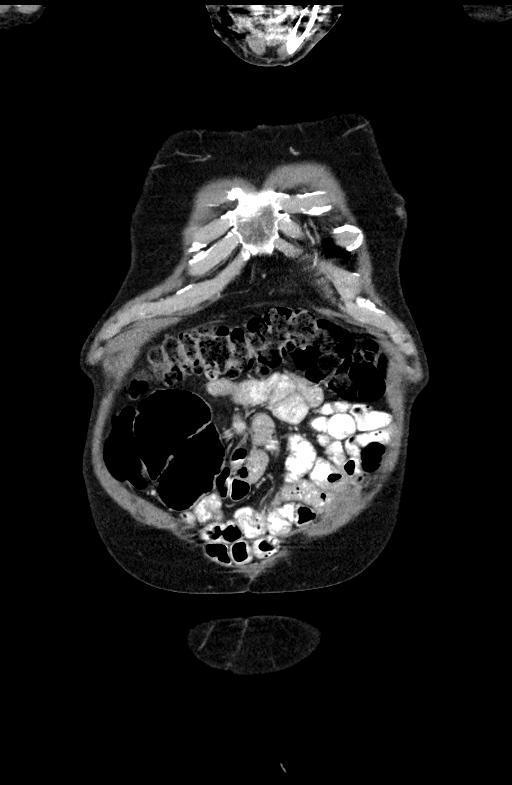
[im 61/151  mediastinal]
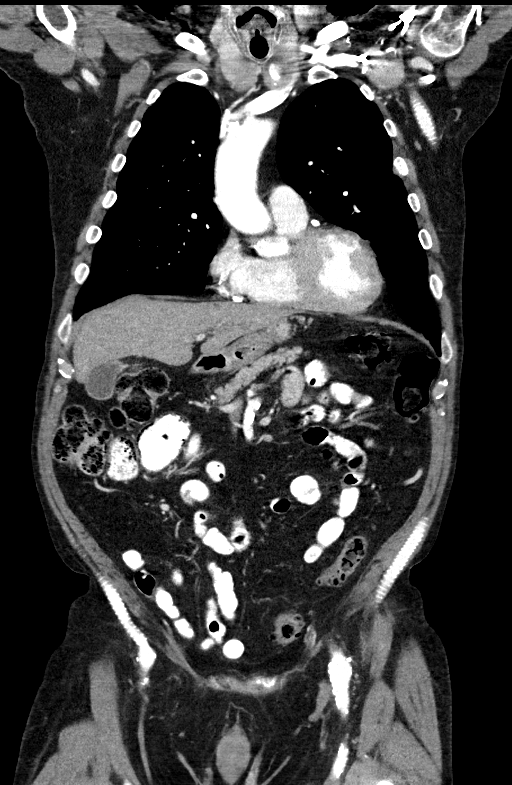
[im 91/151  mediastinal]
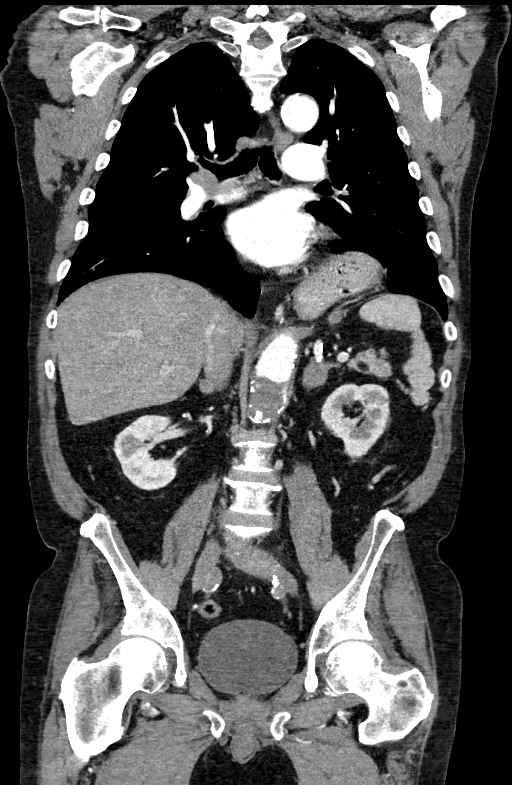

[12 of 36 positions shown; findings below may reference images not displayed]

FINDINGS: CT CHEST FINDINGS

Cardiovascular: Thoracic aorta demonstrates atherosclerotic
calcifications without aneurysmal dilatation or dissection.
Irregular soft plaque is noted in the descending aorta. No cardiac
enlargement is seen. Coronary calcifications are noted. The
pulmonary artery as visualized is within normal limits although some
attenuation of the branches are seen in the region of the right
hilum.

Mediastinum/Nodes: Thoracic inlet is within normal limits. There is
a precarinal nodal mass which measures 2.3 cm in short axis.
Additionally hilar adenopathy is noted best seen on image number 31
of series 2 on the right. This causes compression upon the pulmonary
arterial branches at this level. This nodal mass measures
approximately 2.8 x 3.1 cm in greatest dimension. The esophagus as
visualized is within normal limits. Stable sliding-type hiatal
hernia is noted. Approximately 50% of the stomach remains in the
chest. This is stable from the prior exam.

Lungs/Pleura: The lungs are well aerated bilaterally. Emphysematous
changes are seen. The left lung is clear with the exception of some
basilar atelectasis. In the medial aspect of the left lower lobe
best seen on image number 83 of series 4 there is a 12.7 mm nodule
identified. No other sizable nodules are seen. Some scattered
calcified granulomas are noted as well.

Musculoskeletal: Degenerative changes of the thoracic spine are
noted. No compression deformity is seen. Fractures involving the
right sixth and left fourth rib are noted. Some moth-eaten
appearance of the ribs adjacent to the fractures are seen. A mild
soft tissue component is noted associated with the left fourth rib
fracture. These changes are consistent with pathologic fractures.

CT ABDOMEN PELVIS FINDINGS

Hepatobiliary: Scattered hypodensities are noted within the liver
stable from the prior CT examination likely representing small
cysts. The gallbladder is within normal limits. No biliary ductal
dilatation is seen.

Pancreas: Unremarkable. No pancreatic ductal dilatation or
surrounding inflammatory changes.

Spleen: Normal in size without focal abnormality.

Adrenals/Urinary Tract: Nodular enlargement of the adrenal glands is
noted new from the prior exam worst on the left with a focal 3.4 x
1.9 cm mass lesion identified. These changes are consistent with
adrenal metastatic disease. The kidneys demonstrate cystic change on
the left which is new from the prior CT of [FP] but simple in
appearance. Nonobstructing renal calculi are noted bilaterally right
greater than left. The largest of these is noted on the right
measuring approximately 8.6 mm. No obstructive changes are seen. The
bladder is well distended.

Stomach/Bowel: Diverticular change of the colon is noted without
evidence of diverticulitis. No obstructive or inflammatory changes
are seen. No mass lesion is noted. The appendix is within normal
limits. Small bowel shows no obstructive change. The stomach again
demonstrates the previously described hiatal hernia.

Vascular/Lymphatic: The abdominal aorta demonstrates atherosclerotic
calcification and occlusion just below the renal arteries. These
changes are consistent with the prior aortic repair. Bilateral
axillo to femoral bypass grafts are noted. Previously seen
surrounding seromas have resolved in the interval. The grafts are
widely patent with normal outflow into the superficial and deep
femoral arteries bilaterally.

Reproductive: Prostate is unremarkable.

Other: Mild laxity of the anterior abdominal wall is noted likely
related to prior surgical intervention. No true herniation is seen.
No abdominopelvic ascites.

Musculoskeletal: Mottled appearance to the L5 vertebral body is
noted consistent with that seen on recent MRI. No other definitive
metastatic foci are noted in the lumbar spine.
IMPRESSION: Changes most consistent with a small pulmonary neoplasm with hilar
and mediastinal adenopathy as well as bilateral adrenal metastatic
disease. The previously seen disease in the L5 vertebral body is
again noted also related to metastatic disease. There are bilateral
rib fractures with permeative pattern and associated soft tissue
prominence consistent with metastatic disease and pathologic
fracture. Tissue sampling is recommended. PET-CT may also be helpful
for further evaluation of metastatic disease.

Bilateral patent axillofemoral bypass grafts.

Diverticulosis without diverticulitis.

Bilateral nonobstructing renal calculi.

These results will be called to the ordering clinician or
representative by the Radiologist Assistant, and communication
documented in the PACS or [REDACTED].

## 2020-10-16 MED ORDER — IOHEXOL 300 MG/ML  SOLN
100.0000 mL | Freq: Once | INTRAMUSCULAR | Status: AC | PRN
  Administered 2020-10-16: 100 mL via INTRAVENOUS

## 2020-10-17 NOTE — Pre-Procedure Instructions (Signed)
[facility] - [address], [place] [address] Phone: [phone] Fax: [phone]  [facility], [place] [address] [place] [address] Phone: [phone] Fax: [phone]      Your procedure is scheduled on Tuesday January 25th.  Report to [facility] Main Entrance "A" at 8:00 A.M., and check in at the Admitting office.  Call this number if you have problems the morning of surgery:  [phone]  Call [phone] if you have any questions prior to your surgery date Monday-Friday 8am-4pm    Remember:  Do not eat or drink after midnight the night before your surgery   Take these medicines the morning of surgery with A SIP OF WATER  atenolol (TENORMIN) omeprazole (PRILOSEC)  oxymetazoline (AFRIN)  acetaminophen (TYLENOL) if needed diphenhydrAMINE (BENADRYL) if needed nitroGLYCERIN (NITROSTAT) if needed oxymetazoline (AFRIN) if needed   As of today, STOP taking any Aspirin (unless otherwise instructed by your surgeon) Aleve, Naproxen, Ibuprofen, Motrin, Advil, Goody's, BC's, all herbal medications, fish oil, and all vitamins.                      Do not wear jewelry, make up, or nail polish            Do not wear lotions, powders, perfumes/colognes, or deodorant.            Do not shave 48 hours prior to surgery.  Men may shave face and neck.            Do not bring valuables to the hospital.            [facility] is not responsible for any belongings or valuables.  Do NOT Smoke (Tobacco/Vaping) or drink Alcohol 24 hours prior to your procedure If you use a CPAP at night, you may bring all equipment for your overnight stay.   Contacts, glasses, dentures or bridgework may not be worn into surgery.      For patients admitted to the hospital, discharge time will be determined by your treatment team.   Patients discharged the day of surgery will not be  allowed to drive home, and someone needs to stay with them for 24 hours.    Special instructions:   [facility]- Preparing For Surgery  Before surgery, you can play an important role. Because skin is not sterile, your skin needs to be as free of germs as possible. You can reduce the number of germs on your skin by washing with CHG (chlorahexidine gluconate) Soap before surgery.  CHG is an antiseptic cleaner which kills germs and bonds with the skin to continue killing germs even after washing.    Oral Hygiene is also important to reduce your risk of infection.  Remember - BRUSH YOUR TEETH THE MORNING OF SURGERY WITH YOUR REGULAR TOOTHPASTE  Please do not use if you have an allergy to CHG or antibacterial soaps. If your skin becomes reddened/irritated stop using the CHG.  Do not shave (including legs and underarms) for at least 48 hours prior to first CHG shower. It is OK to shave your face.  Please follow these instructions carefully.   1. Shower the NIGHT BEFORE SURGERY and the MORNING OF SURGERY with CHG Soap.   2. If you chose to wash your hair, wash your hair first as usual with your normal shampoo.  3. After you shampoo, rinse your  hair and body thoroughly to remove the shampoo.  4. Use CHG as you would any other liquid soap. You can apply CHG directly to the skin and wash gently with a scrungie or a clean washcloth.   5. Apply the CHG Soap to your body ONLY FROM THE NECK DOWN.  Do not use on open wounds or open sores. Avoid contact with your eyes, ears, mouth and genitals (private parts). Wash Face and genitals (private parts)  with your normal soap.   6. Wash thoroughly, paying special attention to the area where your surgery will be performed.  7. Thoroughly rinse your body with warm water from the neck down.  8. DO NOT shower/wash with your normal soap after using and rinsing off the CHG Soap.  9. Pat yourself dry with a CLEAN TOWEL.  10. Wear CLEAN PAJAMAS to bed the night  before surgery  11. Place CLEAN SHEETS on your bed the night of your first shower and DO NOT SLEEP WITH PETS.   Day of Surgery: Wear Clean/Comfortable clothing the morning of surgery Do not apply any deodorants/lotions.   Remember to brush your teeth WITH YOUR REGULAR TOOTHPASTE.   Please read over the following fact sheets that you were given.

## 2020-10-18 ENCOUNTER — Telehealth: Payer: Self-pay | Admitting: Emergency Medicine

## 2020-10-18 ENCOUNTER — Other Ambulatory Visit: Payer: Self-pay

## 2020-10-18 ENCOUNTER — Encounter (HOSPITAL_COMMUNITY): Payer: Self-pay

## 2020-10-18 ENCOUNTER — Other Ambulatory Visit (HOSPITAL_COMMUNITY)
Admission: RE | Admit: 2020-10-18 | Discharge: 2020-10-18 | Disposition: A | Discharge status: Home / Self Care | Payer: PPO | Source: Ambulatory Visit | Attending: Neurological Surgery | Admitting: Neurological Surgery

## 2020-10-18 ENCOUNTER — Encounter (HOSPITAL_COMMUNITY)
Admission: RE | Admit: 2020-10-18 | Discharge: 2020-10-18 | Disposition: A | Discharge status: Home / Self Care | Payer: PPO | Source: Ambulatory Visit | Attending: Neurological Surgery | Admitting: Neurological Surgery

## 2020-10-18 DIAGNOSIS — Z79899 Other long term (current) drug therapy: Secondary | ICD-10-CM | POA: Diagnosis not present

## 2020-10-18 DIAGNOSIS — I723 Aneurysm of iliac artery: Secondary | ICD-10-CM | POA: Diagnosis not present

## 2020-10-18 DIAGNOSIS — K5090 Crohn's disease, unspecified, without complications: Secondary | ICD-10-CM | POA: Diagnosis not present

## 2020-10-18 DIAGNOSIS — Z87442 Personal history of urinary calculi: Secondary | ICD-10-CM | POA: Diagnosis not present

## 2020-10-18 DIAGNOSIS — Y939 Activity, unspecified: Secondary | ICD-10-CM | POA: Diagnosis not present

## 2020-10-18 DIAGNOSIS — I2510 Atherosclerotic heart disease of native coronary artery without angina pectoris: Secondary | ICD-10-CM | POA: Insufficient documentation

## 2020-10-18 DIAGNOSIS — M48061 Spinal stenosis, lumbar region without neurogenic claudication: Secondary | ICD-10-CM | POA: Insufficient documentation

## 2020-10-18 DIAGNOSIS — N200 Calculus of kidney: Secondary | ICD-10-CM | POA: Insufficient documentation

## 2020-10-18 DIAGNOSIS — I252 Old myocardial infarction: Secondary | ICD-10-CM | POA: Diagnosis not present

## 2020-10-18 DIAGNOSIS — Z7982 Long term (current) use of aspirin: Secondary | ICD-10-CM | POA: Insufficient documentation

## 2020-10-18 DIAGNOSIS — M47819 Spondylosis without myelopathy or radiculopathy, site unspecified: Secondary | ICD-10-CM | POA: Insufficient documentation

## 2020-10-18 DIAGNOSIS — M79604 Pain in right leg: Secondary | ICD-10-CM | POA: Insufficient documentation

## 2020-10-18 DIAGNOSIS — F17210 Nicotine dependence, cigarettes, uncomplicated: Secondary | ICD-10-CM | POA: Insufficient documentation

## 2020-10-18 DIAGNOSIS — Y929 Unspecified place or not applicable: Secondary | ICD-10-CM | POA: Diagnosis not present

## 2020-10-18 DIAGNOSIS — M549 Dorsalgia, unspecified: Secondary | ICD-10-CM | POA: Insufficient documentation

## 2020-10-18 DIAGNOSIS — S2243XA Multiple fractures of ribs, bilateral, initial encounter for closed fracture: Secondary | ICD-10-CM | POA: Insufficient documentation

## 2020-10-18 DIAGNOSIS — D492 Neoplasm of unspecified behavior of bone, soft tissue, and skin: Secondary | ICD-10-CM | POA: Insufficient documentation

## 2020-10-18 DIAGNOSIS — Z01812 Encounter for preprocedural laboratory examination: Secondary | ICD-10-CM | POA: Insufficient documentation

## 2020-10-18 DIAGNOSIS — Z20822 Contact with and (suspected) exposure to covid-19: Secondary | ICD-10-CM | POA: Insufficient documentation

## 2020-10-18 HISTORY — DX: Malignant (primary) neoplasm, unspecified: C80.1

## 2020-10-18 HISTORY — DX: Hyperlipidemia, unspecified: E78.5

## 2020-10-18 HISTORY — DX: Personal history of urinary calculi: Z87.442

## 2020-10-18 HISTORY — DX: Crohn's disease, unspecified, without complications: K50.90

## 2020-10-18 LAB — CBC
HCT: 55.3 % — ABNORMAL HIGH (ref 39.0–52.0)
Hemoglobin: 17 g/dL (ref 13.0–17.0)
MCH: 30.1 pg (ref 26.0–34.0)
MCHC: 30.7 g/dL (ref 30.0–36.0)
MCV: 98 fL (ref 80.0–100.0)
Platelets: 145 10*3/uL — ABNORMAL LOW (ref 150–400)
RBC: 5.64 MIL/uL (ref 4.22–5.81)
RDW: 14.2 % (ref 11.5–15.5)
WBC: 9.9 10*3/uL (ref 4.0–10.5)
nRBC: 0 % (ref 0.0–0.2)

## 2020-10-18 LAB — COMPREHENSIVE METABOLIC PANEL
ALT: 10 U/L (ref 0–44)
AST: 13 U/L — ABNORMAL LOW (ref 15–41)
Albumin: 3.6 g/dL (ref 3.5–5.0)
Alkaline Phosphatase: 56 U/L (ref 38–126)
Anion gap: 10 (ref 5–15)
BUN: 39 mg/dL — ABNORMAL HIGH (ref 8–23)
CO2: 28 mmol/L (ref 22–32)
Calcium: 9.2 mg/dL (ref 8.9–10.3)
Chloride: 102 mmol/L (ref 98–111)
Creatinine, Ser: 1.51 mg/dL — ABNORMAL HIGH (ref 0.61–1.24)
GFR, Estimated: 47 mL/min — ABNORMAL LOW (ref >60)
Glucose, Bld: 76 mg/dL (ref 70–99)
Potassium: 5.4 mmol/L — ABNORMAL HIGH (ref 3.5–5.1)
Sodium: 140 mmol/L (ref 135–145)
Total Bilirubin: 0.6 mg/dL (ref 0.3–1.2)
Total Protein: 6.5 g/dL (ref 6.5–8.1)

## 2020-10-18 LAB — TYPE AND SCREEN
ABO/RH(D): O POS
Antibody Screen: NEGATIVE

## 2020-10-18 LAB — SURGICAL PCR SCREEN
MRSA, PCR: NEGATIVE
Staphylococcus aureus: NEGATIVE

## 2020-10-18 LAB — SARS CORONAVIRUS 2 (TAT 6-24 HRS): SARS Coronavirus 2: NEGATIVE

## 2020-10-18 NOTE — Telephone Encounter (Signed)
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 The previously seen disease in the L5 vertebral body is again noted also related to metastatic disease. There are  bilateral rib fractures with permeative pattern and associated soft tissue prominence consistent with metastatic disease and pathologic fracture. Tissue sampling is recommended. PET-CT may also be helpful for further evaluation of metastatic disease.

## 2020-10-18 NOTE — Progress Notes (Signed)
Anesthesia Chart Review:  Case: [id number] Date/Time: 10/22/20 0945   Procedure: Lumbar 5 Open Laminectomy for tumor resection (N/A ) - 3C/RM 21   Anesthesia type: General   Pre-op diagnosis: Lumbar spine tumor   Location: [facility] OR ROOM 21 / [place] [facility]   Surgeons: Judith Part, MD      DISCUSSION: Patient is a 80 year old male scheduled for the above procedure. He is followed regularly by vascular surgeon Dr. Trula Slade following 2011 surgery for bilateral axillofemoral bypass and removal of infected AAA graft. Work-in visit 10/07/20 for several weeks of right back and leg pain, but not felt to be vascular in etiology. Primary care sent him to orthopedics where imaging revealed a spinal tumor (complete replacement of the L5 vertebral body with tumor, consider metastatic disease or plasmacytoma). He was then referred to Dr. Zada Finders for tumor resection. 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   Other history includes smoking, CAD (lateral STEMI, s/p Horizon study stent to the [facility] 08/11/05), Crohn's disease, AAA (s/p open repair with Aorto-bifemoral graft 03/03/10 Dr. 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 coli; on lifelong antibiotics per ID levofloxacin-->Bactrim DS), skin cancer.   I called and spoke with Mr. Laabs. He denied any cardiac issues since his MI. He believes his last ischemic evaluation was one year post 2006 PCI. He  does not recall having a cardiac work-up prior to AAA repair in 2011. He used to play golf, but following his axillofemoral bypass graft (with over sewing of aortic stump) he was not able to tolerate walking as long distance due to claudication type symptoms. However, his daughter who is a physical therapist and kinesiologist developed an exercise program for him. He had been going to the health club 3 times per week and works on speed walking on the treadmill and some weight training. He denied chest pain, SOB, syncope. He gets refills of his Nitro yearly, but has not had to use any. He is still able to walk independently, but now has back and RLE pain related to his spinal tumor.   Discussed case with anesthesiologist Wallis Bamberg, MD. Patient with remote history of MI with D1 PCI in 2006. He underwent several high risk vascular procedures in 2011. Until very recently, he was going to the gym 3 times per week. He denied CV symptoms, and has not needed Nitro. EKG appears stable. Now with spinal tumor and concern that he may have metastatic disease. He needs spinal tumor resection and definitive tissue diagnosis. Anesthesiologist to evaluate on the day of surgery and discuss definitve anesthesia plan.    [place] COVID-19 vaccine 06/24/20. 10/18/20 presurgical COVID-19 test in process.    VS: BP (!) 105/53    Pulse 66    Temp (!) 36.4 C (Oral)    Resp 19    Ht 5\' 9"  (1.753 m)    Wt 79.4 kg    SpO2 93%    BMI 25.84 kg/m    PROVIDERS: [name], MD is PCP [facility]). Annual exam 07/02/20.  Back and RLE pain visit 10/08/20. Also seen yearly at the [facility].  Theotis Burrow, MD is vascular surgeon Tommy Medal, Roderic Scarce, MD is ID He is not currently followed by cardiology, but was evaluated by Phill Mutter, MD and Wynetta Emery, MD (PCI) in 07/2005 for MI, D1 stent.     LABS: Labs reviewed: Acceptable for surgery. K 5.4, no mention of hemolysis. Cr 1.51, previously  1.50 on 10/16/20 and 1.21 on 07/02/20 ([facility]).  (all labs ordered are listed, but only abnormal results are displayed)  Labs Reviewed  COMPREHENSIVE METABOLIC PANEL - Abnormal; Notable for the following components:      Result Value   Potassium 5.4 (*)    BUN 39 (*)    Creatinine, Ser 1.51 (*)    AST 13 (*)    GFR, Estimated 47 (*)    All other components within normal limits  CBC - Abnormal; Notable for the following components:   HCT 55.3 (*)    Platelets 145 (*)    All other components within normal limits  SURGICAL PCR SCREEN  TYPE AND SCREEN     IMAGES: CT chest/abd/pelvis 10/16/20: IMPRESSION: - Changes most consistent with a small pulmonary neoplasm with hilar and mediastinal adenopathy as well as bilateral adrenal metastatic disease. The previously seen disease in the L5 vertebral body is again noted also related to metastatic disease. There are bilateral rib fractures with permeative pattern and associated soft tissue prominence consistent with metastatic disease and pathologic fracture. Tissue sampling is recommended. PET-CT may also be helpful for further evaluation of metastatic disease. - Bilateral patent axillofemoral bypass grafts. - Diverticulosis without diverticulitis. - Bilateral nonobstructing renal calculi.  MRI L-spine 10/11/20 Noemi Chapel, Herbie Baltimore, MD, report in [facility]/PACS): IMPRESSION: 1. Complete replacement of the L5 vertebral body with tumor. Extraosseous extension into the anterior epidural space and both L5-S1 neural foramina with severe spinal canal and neuroforaminal stenosis. Tumor affects the bilateral L5 and right S1 nerve roots. Differential considerations include metastatic disease and plasmacytoma. 2. Multilevel spondylosis superimposed on congenitally short pedicles with severe spinal canal stenosis at L3-L4 and moderate spinal canal stenosis at L2-L3 and L4-L5. 3. 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   EKG: 10/18/20: Normal sinus rhythm Left axis deviation Septal infarct , age undetermined Abnormal ECG No significant change since last tracing Confirmed by Kate Sable [phone]) on 10/18/2020 11:39:21 AM   CV: Cardiac cath 08/11/05 Olevia Perches, Darnell Level, MD, [facility]):  CONCLUSIONS:  1.  Acute lateral wall myocardial infarction with 50% stenosis in the ostium      of the LAD, 90% stenosis in the ostium of the first large diagonal      branch, irregularities in the circumflex artery, 40% distal stenosis in      the right coronary artery, and mild anterolateral wall hyperkinesis with      an estimated ejection fraction of 60%.  2.  Successful stenting of the ostial lesion in the diagonal branch of the      LAD using Horizon study stent with improvement with central narrowing      from 90% to less than 10%.   Past Medical History:  Diagnosis Date   AAA (abdominal aortic aneurysm) ([place])    s/p open repair using aortobifemoral graft 03/03/10 ([facility]), complicated by wound dehisence, enterocutaneous fistula; developed aortic graft infection s/p explant of graft and placement of bilateral axillofemoral grafts 07/22/10 [facility])   Cancer ([place])    Skin   Crohn's disease ([place])  E coli bacteremia    History of kidney stones    Hyperlipemia    Hypertension    "denies"   Myocardial infarction ([place]) 08/11/2005   s/p Horizon study stent D1   Peripheral vascular disease ([place]) June 2011   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    Past Surgical History:  Procedure Laterality Date   ABDOMINAL AORTIC ANEURYSM REPAIR  03/03/2010   AXILLARY-FEMORAL BYPASS GRAFT  07/22/2010   bilateral   bowel     herniated bowel   CARDIAC CATHETERIZATION     CORONARY STENT PLACEMENT     CYSTOSCOPY     MOHS SURGERY     several     MEDICATIONS:  acetaminophen (TYLENOL) 500 MG tablet   aspirin 81 MG tablet   atenolol (TENORMIN) 25 MG tablet   calcium carbonate  (TUMS - DOSED IN MG ELEMENTAL CALCIUM) 500 MG chewable tablet   diphenhydrAMINE (BENADRYL) 25 MG tablet   ferrous sulfate 325 (65 FE) MG tablet   nitroGLYCERIN (NITROSTAT) 0.4 MG SL tablet   omeprazole (PRILOSEC) 10 MG capsule   oxymetazoline (AFRIN) 0.05 % nasal spray   rosuvastatin (CRESTOR) 10 MG tablet   sulfamethoxazole-trimethoprim (BACTRIM DS) 800-160 MG per tablet   No current facility-administered medications for this encounter.  ASA on hold for surgery.   Myra Gianotti, PA-C Surgical Short Stay/Anesthesiology [facility] Phone [phone] [facility] Phone [phone] 10/18/2020 4:19 PM

## 2020-10-18 NOTE — Progress Notes (Addendum)
PCP - Dr. Marjory Lies Marrow  Cardiologist - Gregory Hodges states that he saw a Cardiologist years ago , when he had a MI.  - I found records of patient seeing Dr, [name] in 2006 Patient reports that he has not seen a cardiologist  In [place] or [place].  Chest x-ray - na  EKG - 10/18/20  Stress Test - none found  ECHO - none found  Cardiac Cath - 2012 - [facility]  Sleep Study - no CPAP - no  LABS-CBC, T/S, CMP- elevatated liver functions in the past and on Bactrim DS   ASA- on hold-(will check- it was last week when he saw Dr. [name].)  ERAS-no  HA1C-no Fasting Blood Sugar - na Checks Blood Sugar ___0__ times a day  Anesthesia-  Pt denies having chest pain, sob, or fever at this time. All instructions explained to the pt, with a verbal understanding of the material. Pt agrees to go over the instructions while at home for a better understanding. Pt also instructed to self quarantine after being tested for COVID-19. The opportunity to ask questions was provided.

## 2020-10-18 NOTE — Anesthesia Preprocedure Evaluation (Addendum)
Anesthesia Evaluation  Patient identified by MRN, date of birth, ID band Patient awake    Reviewed: Allergy & Precautions, NPO status , Patient's Chart, lab work & pertinent test results  Airway Mallampati: III  TM Distance: >3 FB Neck ROM: Full    Dental no notable dental hx.    Pulmonary Current SmokerPatient did not abstain from smoking.,    Pulmonary exam normal breath sounds clear to auscultation       Cardiovascular hypertension, Pt. on home beta blockers + CAD, + Past MI, + Cardiac Stents and + Peripheral Vascular Disease  Normal cardiovascular exam Rhythm:Regular Rate:Normal  ECG: rate 68.Normal sinus rhythm, Left axis deviation   Neuro/Psych negative neurological ROS  negative psych ROS   GI/Hepatic negative GI ROS, Neg liver ROS,   Endo/Other  negative endocrine ROS  Renal/GU negative Renal ROS     Musculoskeletal negative musculoskeletal ROS (+)   Abdominal   Peds  Hematology HLD   Anesthesia Other Findings Lumbar spine tumor  Reproductive/Obstetrics                           Anesthesia Physical Anesthesia Plan  ASA: III  Anesthesia Plan: General   Post-op Pain Management:    Induction: Intravenous  PONV Risk Score and Plan: 1 and Ondansetron, Dexamethasone and Treatment may vary due to age or medical condition  Airway Management Planned: Oral ETT  Additional Equipment: Arterial line  Intra-op Plan:   Post-operative Plan: Extubation in OR  Informed Consent: I have reviewed the patients History and Physical, chart, labs and discussed the procedure including the risks, benefits and alternatives for the proposed anesthesia with the patient or authorized representative who has indicated his/her understanding and acceptance.     Dental advisory given  Plan Discussed with: CRNA  Anesthesia Plan Comments: (Reviewed PAT note written 10/18/2020 by Myra Gianotti,  PA-C. )      Anesthesia Quick Evaluation

## 2020-10-18 NOTE — Pre-Procedure Instructions (Signed)
Gregory Hodges   Your procedure is scheduled on Tuesday January 25th.  Report to [facility] Main Entrance "A" at 8:00 A.M., and check in at the Admitting office.               Your surgery or procedure is scheduled to begin at 10:00 AM  Call this number if you have problems the morning of surgery:  [phone]  Call [phone] if you have any questions prior to your surgery date Monday-Friday 8am-4pm    Remember:  Do not eat or drink after midnight the night before your surgery   Take these medicines the morning of surgery with A SIP OF WATER  atenolol (TENORMIN) omeprazole (PRILOSEC)  oxymetazoline (AFRIN) -if needed. acetaminophen (TYLENOL) if needed diphenhydrAMINE (BENADRYL) if needed nitroGLYCERIN (NITROSTAT) if needed oxymetazoline (AFRIN) if needed   As of today, STOP taking any Aspirin (unless otherwise instructed by your surgeon) Aleve, Naproxen, Ibuprofen, Motrin, Advil, Goody's, BC's, all herbal medications, fish oil, and all vitamins.              Do NOT Smoke (Tobacco/Vaping) or drink Alcohol 24 hours prior to your procedure If you use a CPAP at night, you may bring all equipment for your overnight stay.    Special instructions:   [place]- Preparing For Surgery  Before surgery, you can play an important role. Because skin is not sterile, your skin needs to be as free of germs as possible. You can reduce the number of germs on your skin by washing with CHG (chlorahexidine gluconate) Soap before surgery.  CHG is an antiseptic cleaner which kills germs and bonds with the skin to continue killing germs even after washing.    Oral Hygiene is also important to reduce your risk of infection.  Remember - BRUSH YOUR TEETH THE MORNING OF SURGERY WITH YOUR REGULAR TOOTHPASTE  Please do not use if you have an allergy to CHG or antibacterial soaps. If your skin becomes reddened/irritated stop using the CHG.  Do not shave (including legs and underarms) for at least 48  hours prior to first CHG shower. It is OK to shave your face.  Please follow these instructions carefully.   1. Shower the NIGHT BEFORE SURGERY and the MORNING OF SURGERY with CHG Soap.   2. If you chose to wash your hair, wash your hair first as usual with your normal shampoo.  3. After you shampoo, wash your face and private area with the soap you use at home, then rinse your hair and body thoroughly to remove the shampoo and soap.your hair and body thoroughly to remove the shampoo.  4. Use CHG as you would any other liquid soap. You can apply CHG directly to the skin and wash gently with a scrungie or a clean washcloth.   Apply the CHG Soap to your body ONLY FROM THE NECK DOWN.  Do not use on open wounds or open sores. Avoid contact with your eyes, ears, mouth and genitals (private parts). 5. Wash thoroughly, paying special attention to the area where your surgery will be performed.  6. Thoroughly rinse your body with warm water from the neck down.  7. DO NOT shower/wash with your normal soap after using and rinsing off the CHG Soap.  8. Pat yourself dry with a CLEAN TOWEL.  9. Wear CLEAN PAJAMAS to bed the night before surgery  10. Place CLEAN SHEETS on your bed the night of your first shower and DO NOT SLEEP WITH PETS.  Day of Surgery: Shower as instructed above.             Do not wear lotions, powders, perfumes/colognes, or deodorant Wear Clean/Comfortable clothing the morning of surgery  Remember to brush your teeth WITH YOUR REGULAR TOOTHPASTE.             Do not wear jewelry, make up, or nail polish             Men may shave face and neck.             Do not bring valuables to the hospital.             [facility] is not responsible for any belongings or valuables.  Contacts, glasses, dentures or bridgework may not be worn into surgery.      For patients admitted to the hospital, discharge time will be determined by your treatment team.   Patients discharged the  day of surgery will not be allowed to drive home, and someone needs to stay with them for 24 hours. Please read over the act sheets that you were given.

## 2020-10-18 NOTE — Telephone Encounter (Signed)
[facility]:  We were contacted by the [place] [facility].  CT chest abdomen pelvis was ordered on 10/16/2020 by Dr. Zada Finders in order to evaluate newly identified apparently metastatic disease to L5.  It looks like he has surgical procedure scheduled for 1/25 with Dr. Zada Finders.  If tissue diagnosis is not obtained with that procedure then the right hilar abnormality should be reachable by endobronchial ultrasound and bronchoscopy.  Recommendations: Referral to pulmonary if no tissue diagnosis obtained from his upcoming lumbar surgical procedure on 1/25.  Anticipate that both pulmonary and oncology referrals will be made while the patient is an inpatient to help plan either therapy or next diagnostic steps.   [facility] 10/18/2020 5:10 PM

## 2020-10-22 ENCOUNTER — Inpatient Hospital Stay (HOSPITAL_COMMUNITY)
Admission: RE | Admit: 2020-10-22 | Discharge: 2020-10-23 | DRG: 982 | Disposition: A | Discharge status: Home / Self Care | Payer: PPO | Source: Ambulatory Visit | Attending: Neurological Surgery | Admitting: Neurological Surgery

## 2020-10-22 ENCOUNTER — Inpatient Hospital Stay (HOSPITAL_COMMUNITY): Payer: PPO

## 2020-10-22 ENCOUNTER — Inpatient Hospital Stay (HOSPITAL_COMMUNITY): Payer: PPO | Admitting: Physician Assistant

## 2020-10-22 ENCOUNTER — Other Ambulatory Visit: Payer: Self-pay

## 2020-10-22 ENCOUNTER — Inpatient Hospital Stay (HOSPITAL_COMMUNITY): Payer: PPO | Admitting: Anesthesiology

## 2020-10-22 ENCOUNTER — Encounter (HOSPITAL_COMMUNITY): Payer: Self-pay | Admitting: Neurological Surgery

## 2020-10-22 ENCOUNTER — Encounter (HOSPITAL_COMMUNITY)
Admission: RE | Disposition: A | Discharge status: Home / Self Care | Payer: Self-pay | Source: Ambulatory Visit | Attending: Neurological Surgery

## 2020-10-22 DIAGNOSIS — I2510 Atherosclerotic heart disease of native coronary artery without angina pectoris: Secondary | ICD-10-CM | POA: Diagnosis not present

## 2020-10-22 DIAGNOSIS — K5090 Crohn's disease, unspecified, without complications: Secondary | ICD-10-CM | POA: Diagnosis present

## 2020-10-22 DIAGNOSIS — I739 Peripheral vascular disease, unspecified: Secondary | ICD-10-CM | POA: Diagnosis not present

## 2020-10-22 DIAGNOSIS — Z955 Presence of coronary angioplasty implant and graft: Secondary | ICD-10-CM | POA: Diagnosis not present

## 2020-10-22 DIAGNOSIS — M5126 Other intervertebral disc displacement, lumbar region: Secondary | ICD-10-CM | POA: Diagnosis not present

## 2020-10-22 DIAGNOSIS — E785 Hyperlipidemia, unspecified: Secondary | ICD-10-CM | POA: Diagnosis not present

## 2020-10-22 DIAGNOSIS — D492 Neoplasm of unspecified behavior of bone, soft tissue, and skin: Secondary | ICD-10-CM | POA: Diagnosis not present

## 2020-10-22 DIAGNOSIS — Z87442 Personal history of urinary calculi: Secondary | ICD-10-CM

## 2020-10-22 DIAGNOSIS — I10 Essential (primary) hypertension: Secondary | ICD-10-CM | POA: Diagnosis present

## 2020-10-22 DIAGNOSIS — C801 Malignant (primary) neoplasm, unspecified: Secondary | ICD-10-CM | POA: Diagnosis not present

## 2020-10-22 DIAGNOSIS — I252 Old myocardial infarction: Secondary | ICD-10-CM | POA: Diagnosis not present

## 2020-10-22 DIAGNOSIS — M5416 Radiculopathy, lumbar region: Secondary | ICD-10-CM | POA: Diagnosis not present

## 2020-10-22 DIAGNOSIS — M8448XA Pathological fracture, other site, initial encounter for fracture: Secondary | ICD-10-CM | POA: Diagnosis present

## 2020-10-22 DIAGNOSIS — D497 Neoplasm of unspecified behavior of endocrine glands and other parts of nervous system: Secondary | ICD-10-CM | POA: Diagnosis not present

## 2020-10-22 DIAGNOSIS — M47816 Spondylosis without myelopathy or radiculopathy, lumbar region: Secondary | ICD-10-CM | POA: Diagnosis not present

## 2020-10-22 DIAGNOSIS — F17210 Nicotine dependence, cigarettes, uncomplicated: Secondary | ICD-10-CM | POA: Diagnosis not present

## 2020-10-22 DIAGNOSIS — M48061 Spinal stenosis, lumbar region without neurogenic claudication: Secondary | ICD-10-CM | POA: Diagnosis present

## 2020-10-22 DIAGNOSIS — C7951 Secondary malignant neoplasm of bone: Secondary | ICD-10-CM | POA: Diagnosis not present

## 2020-10-22 DIAGNOSIS — Z419 Encounter for procedure for purposes other than remedying health state, unspecified: Secondary | ICD-10-CM

## 2020-10-22 DIAGNOSIS — Z9889 Other specified postprocedural states: Secondary | ICD-10-CM | POA: Diagnosis not present

## 2020-10-22 HISTORY — PX: LAMINECTOMY: SHX219

## 2020-10-22 IMAGING — RF DG C-ARM 1-60 MIN
1 series · 1 of 1 positions shown · non-contrast
Comparison: None.

CLINICAL DATA: L5 laminectomy tumor resection. L5 metastasis with
soft tissue encroachment into the spinal canal.

EXAM:
OPERATIVE LUMBAR SPINE L5 VIEW(S)

[Series 1: run · 1 of 1 slices shown]
[im 1/1]
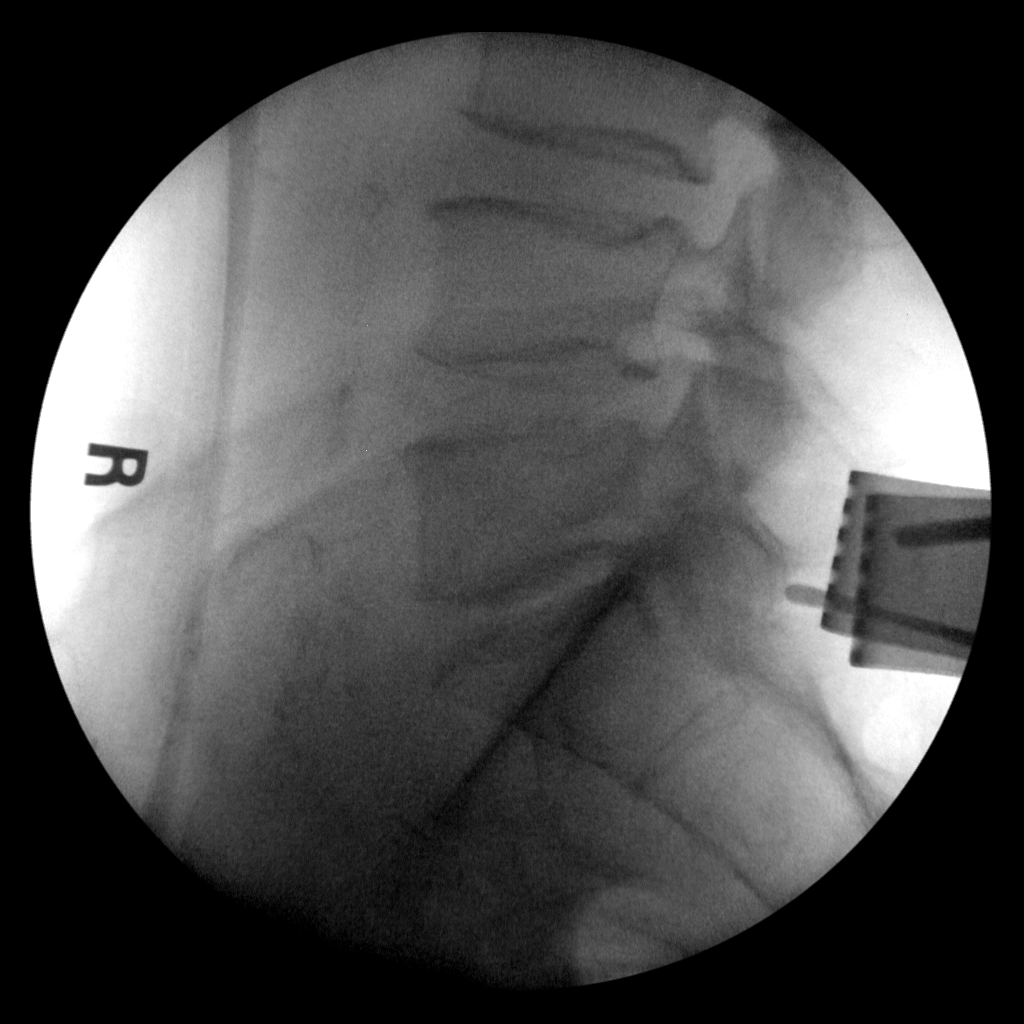

[1 of 1 positions shown; findings below may reference images not displayed]

FINDINGS: Single intraoperative view is submitted. Surgical probe is directed
at the L5 spinous process. Soft tissue retractors are in place.
IMPRESSION: Intraoperative localization of L5.

## 2020-10-22 IMAGING — RF DG LUMBAR SPINE 2-3V
1 series · 1 of 1 positions shown · non-contrast
Comparison: None.

CLINICAL DATA: L5 laminectomy tumor resection. L5 metastasis with
soft tissue encroachment into the spinal canal.

EXAM:
OPERATIVE LUMBAR SPINE L5 VIEW(S)

[Series 1: run · 1 of 1 slices shown]
[im 1/1]
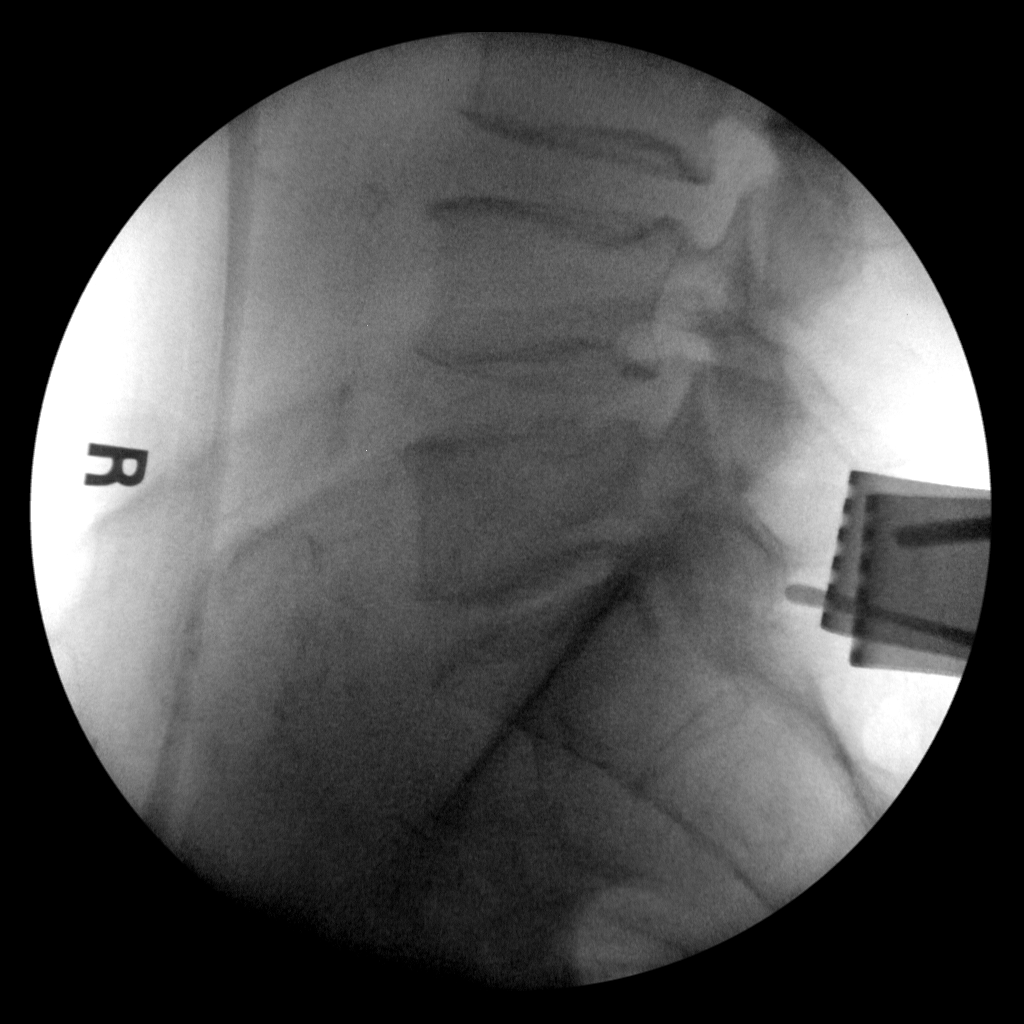

[1 of 1 positions shown; findings below may reference images not displayed]

FINDINGS: Single intraoperative view is submitted. Surgical probe is directed
at the L5 spinous process. Soft tissue retractors are in place.
IMPRESSION: Intraoperative localization of L5.

## 2020-10-22 SURGERY — LUMBAR LAMINECTOMY FOR TUMOR
Anesthesia: General | Site: Spine Lumbar

## 2020-10-22 MED ORDER — CYCLOBENZAPRINE HCL 10 MG PO TABS: 10.0000 mg | ORAL_TABLET | Freq: Three times a day (TID) | ORAL | Status: DC | PRN

## 2020-10-22 MED ORDER — OXYCODONE HCL 5 MG PO TABS: 5.0000 mg | ORAL_TABLET | ORAL | Status: DC | PRN

## 2020-10-22 MED ORDER — SUFENTANIL CITRATE 50 MCG/ML IV SOLN
INTRAVENOUS | Status: AC
  Filled 2020-10-22: qty 1

## 2020-10-22 MED ORDER — NITROGLYCERIN 0.4 MG SL SUBL: 0.4000 mg | SUBLINGUAL_TABLET | SUBLINGUAL | Status: DC | PRN

## 2020-10-22 MED ORDER — HYDROMORPHONE HCL 1 MG/ML IJ SOLN: 1.0000 mg | INTRAMUSCULAR | Status: DC | PRN

## 2020-10-22 MED ORDER — SUFENTANIL CITRATE 50 MCG/ML IV SOLN
INTRAVENOUS | Status: DC | PRN
  Administered 2020-10-22 (×2): 5 ug via INTRAVENOUS
  Administered 2020-10-22: 15 ug via INTRAVENOUS

## 2020-10-22 MED ORDER — ROCURONIUM BROMIDE 10 MG/ML (PF) SYRINGE
PREFILLED_SYRINGE | INTRAVENOUS | Status: DC | PRN
  Administered 2020-10-22: 70 mg via INTRAVENOUS
  Administered 2020-10-22: 30 mg via INTRAVENOUS

## 2020-10-22 MED ORDER — LIDOCAINE-EPINEPHRINE 1 %-1:100000 IJ SOLN
INTRAMUSCULAR | Status: DC | PRN
  Administered 2020-10-22: 2.5 mL

## 2020-10-22 MED ORDER — BUPIVACAINE HCL (PF) 0.5 % IJ SOLN
INTRAMUSCULAR | Status: AC
  Filled 2020-10-22: qty 30

## 2020-10-22 MED ORDER — BUPIVACAINE HCL (PF) 0.5 % IJ SOLN
INTRAMUSCULAR | Status: DC | PRN
  Administered 2020-10-22: 2.5 mL

## 2020-10-22 MED ORDER — CHLORHEXIDINE GLUCONATE CLOTH 2 % EX PADS: 6.0000 | MEDICATED_PAD | Freq: Once | CUTANEOUS | Status: DC

## 2020-10-22 MED ORDER — PANTOPRAZOLE SODIUM 40 MG PO TBEC: 40.0000 mg | DELAYED_RELEASE_TABLET | Freq: Every day | ORAL | Status: DC

## 2020-10-22 MED ORDER — ACETAMINOPHEN 10 MG/ML IV SOLN: 1000.0000 mg | Freq: Once | INTRAVENOUS | Status: DC | PRN

## 2020-10-22 MED ORDER — THROMBIN 5000 UNITS EX SOLR
CUTANEOUS | Status: AC
  Filled 2020-10-22: qty 5000

## 2020-10-22 MED ORDER — SODIUM CHLORIDE 0.9 % IV SOLN: 250.0000 mL | INTRAVENOUS | Status: DC

## 2020-10-22 MED ORDER — MIDAZOLAM HCL 2 MG/2ML IJ SOLN
INTRAMUSCULAR | Status: AC
  Filled 2020-10-22: qty 2

## 2020-10-22 MED ORDER — ROSUVASTATIN CALCIUM 5 MG PO TABS
10.0000 mg | ORAL_TABLET | Freq: Every day | ORAL | Status: DC
  Administered 2020-10-22: 10 mg via ORAL
  Filled 2020-10-22: qty 2

## 2020-10-22 MED ORDER — ACETAMINOPHEN 650 MG RE SUPP: 650.0000 mg | RECTAL | Status: DC | PRN

## 2020-10-22 MED ORDER — PROPOFOL 10 MG/ML IV BOLUS
INTRAVENOUS | Status: DC | PRN
  Administered 2020-10-22: 150 mg via INTRAVENOUS

## 2020-10-22 MED ORDER — THROMBIN 5000 UNITS EX SOLR
OROMUCOSAL | Status: DC | PRN
  Administered 2020-10-22: 5 mL via TOPICAL

## 2020-10-22 MED ORDER — OXYMETAZOLINE HCL 0.05 % NA SOLN
1.0000 | Freq: Two times a day (BID) | NASAL | Status: DC | PRN
  Filled 2020-10-22: qty 30

## 2020-10-22 MED ORDER — PHENOL 1.4 % MT LIQD: 1.0000 | OROMUCOSAL | Status: DC | PRN

## 2020-10-22 MED ORDER — SODIUM CHLORIDE 0.9% FLUSH
3.0000 mL | Freq: Two times a day (BID) | INTRAVENOUS | Status: DC
  Administered 2020-10-22: 3 mL via INTRAVENOUS

## 2020-10-22 MED ORDER — ACETAMINOPHEN 10 MG/ML IV SOLN
INTRAVENOUS | Status: AC
  Filled 2020-10-22: qty 100

## 2020-10-22 MED ORDER — ONDANSETRON HCL 4 MG/2ML IJ SOLN: 4.0000 mg | Freq: Once | INTRAMUSCULAR | Status: DC | PRN

## 2020-10-22 MED ORDER — LACTATED RINGERS IV SOLN: INTRAVENOUS | Status: DC | PRN

## 2020-10-22 MED ORDER — ASPIRIN EC 81 MG PO TBEC: 81.0000 mg | DELAYED_RELEASE_TABLET | Freq: Every day | ORAL | Status: DC

## 2020-10-22 MED ORDER — ORAL CARE MOUTH RINSE: 15.0000 mL | Freq: Once | OROMUCOSAL | Status: AC

## 2020-10-22 MED ORDER — LACTATED RINGERS IV SOLN: INTRAVENOUS | Status: DC

## 2020-10-22 MED ORDER — ATENOLOL 25 MG PO TABS
25.0000 mg | ORAL_TABLET | Freq: Two times a day (BID) | ORAL | Status: DC
  Administered 2020-10-22: 25 mg via ORAL
  Filled 2020-10-22: qty 1

## 2020-10-22 MED ORDER — CEFAZOLIN SODIUM-DEXTROSE 2-4 GM/100ML-% IV SOLN
2.0000 g | Freq: Three times a day (TID) | INTRAVENOUS | Status: AC
  Administered 2020-10-22 – 2020-10-23 (×2): 2 g via INTRAVENOUS
  Filled 2020-10-22 (×2): qty 100

## 2020-10-22 MED ORDER — ACETAMINOPHEN 10 MG/ML IV SOLN
INTRAVENOUS | Status: DC | PRN
  Administered 2020-10-22: 1000 mg via INTRAVENOUS

## 2020-10-22 MED ORDER — DEXAMETHASONE SODIUM PHOSPHATE 10 MG/ML IJ SOLN
INTRAMUSCULAR | Status: DC | PRN
  Administered 2020-10-22: 10 mg via INTRAVENOUS

## 2020-10-22 MED ORDER — PROPOFOL 10 MG/ML IV BOLUS
INTRAVENOUS | Status: AC
  Filled 2020-10-22: qty 40

## 2020-10-22 MED ORDER — SUGAMMADEX SODIUM 200 MG/2ML IV SOLN
INTRAVENOUS | Status: DC | PRN
  Administered 2020-10-22: 200 mg via INTRAVENOUS

## 2020-10-22 MED ORDER — 0.9 % SODIUM CHLORIDE (POUR BTL) OPTIME
TOPICAL | Status: DC | PRN
  Administered 2020-10-22: 1000 mL

## 2020-10-22 MED ORDER — CEFAZOLIN SODIUM-DEXTROSE 2-4 GM/100ML-% IV SOLN
2.0000 g | INTRAVENOUS | Status: AC
  Administered 2020-10-22: 2 g via INTRAVENOUS
  Filled 2020-10-22: qty 100

## 2020-10-22 MED ORDER — POLYETHYLENE GLYCOL 3350 17 G PO PACK: 17.0000 g | PACK | Freq: Every day | ORAL | Status: DC | PRN

## 2020-10-22 MED ORDER — MENTHOL 3 MG MT LOZG: 1.0000 | LOZENGE | OROMUCOSAL | Status: DC | PRN

## 2020-10-22 MED ORDER — ONDANSETRON HCL 4 MG/2ML IJ SOLN
INTRAMUSCULAR | Status: DC | PRN
  Administered 2020-10-22: 4 mg via INTRAVENOUS

## 2020-10-22 MED ORDER — FENTANYL CITRATE (PF) 100 MCG/2ML IJ SOLN: 25.0000 ug | INTRAMUSCULAR | Status: DC | PRN

## 2020-10-22 MED ORDER — DEXMEDETOMIDINE (PRECEDEX) IN NS 20 MCG/5ML (4 MCG/ML) IV SYRINGE
PREFILLED_SYRINGE | INTRAVENOUS | Status: DC | PRN
  Administered 2020-10-22: 12 ug via INTRAVENOUS
  Administered 2020-10-22: 4 ug via INTRAVENOUS

## 2020-10-22 MED ORDER — SODIUM CHLORIDE 0.9% FLUSH: 3.0000 mL | INTRAVENOUS | Status: DC | PRN

## 2020-10-22 MED ORDER — CHLORHEXIDINE GLUCONATE 0.12 % MT SOLN
15.0000 mL | Freq: Once | OROMUCOSAL | Status: AC
  Administered 2020-10-22: 15 mL via OROMUCOSAL
  Filled 2020-10-22: qty 15

## 2020-10-22 MED ORDER — PHENYLEPHRINE 40 MCG/ML (10ML) SYRINGE FOR IV PUSH (FOR BLOOD PRESSURE SUPPORT)
PREFILLED_SYRINGE | INTRAVENOUS | Status: DC | PRN
  Administered 2020-10-22 (×2): 120 ug via INTRAVENOUS

## 2020-10-22 MED ORDER — DOCUSATE SODIUM 100 MG PO CAPS
100.0000 mg | ORAL_CAPSULE | Freq: Two times a day (BID) | ORAL | Status: DC
  Administered 2020-10-22: 100 mg via ORAL
  Filled 2020-10-22: qty 1

## 2020-10-22 MED ORDER — LIDOCAINE-EPINEPHRINE 1 %-1:100000 IJ SOLN
INTRAMUSCULAR | Status: AC
  Filled 2020-10-22: qty 1

## 2020-10-22 MED ORDER — ACETAMINOPHEN 325 MG PO TABS
650.0000 mg | ORAL_TABLET | ORAL | Status: DC | PRN
  Administered 2020-10-22: 650 mg via ORAL
  Filled 2020-10-22: qty 2

## 2020-10-22 MED ORDER — ONDANSETRON HCL 4 MG PO TABS: 4.0000 mg | ORAL_TABLET | Freq: Four times a day (QID) | ORAL | Status: DC | PRN

## 2020-10-22 MED ORDER — ONDANSETRON HCL 4 MG/2ML IJ SOLN: 4.0000 mg | Freq: Four times a day (QID) | INTRAMUSCULAR | Status: DC | PRN

## 2020-10-22 MED ORDER — LIDOCAINE HCL (CARDIAC) PF 100 MG/5ML IV SOSY
PREFILLED_SYRINGE | INTRAVENOUS | Status: DC | PRN
  Administered 2020-10-22: 60 mg via INTRAVENOUS

## 2020-10-22 MED ORDER — OXYCODONE HCL 5 MG PO TABS
10.0000 mg | ORAL_TABLET | ORAL | Status: DC | PRN
Start: 2020-10-22 — End: 2020-10-23

## 2020-10-22 MED ORDER — PHENYLEPHRINE HCL-NACL 10-0.9 MG/250ML-% IV SOLN
INTRAVENOUS | Status: DC | PRN
  Administered 2020-10-22: 75 ug/min via INTRAVENOUS

## 2020-10-22 SURGICAL SUPPLY — 65 items
ADH SKN CLS APL DERMABOND .7 (GAUZE/BANDAGES/DRESSINGS) ×1
APL SKNCLS NONHYPOALLERGENIC (GAUZE/BANDAGES/DRESSINGS)
APL SKNCLS STERI-STRIP NONHPOA (GAUZE/BANDAGES/DRESSINGS)
BAND INSRT 18 STRL LF DISP RB (MISCELLANEOUS) ×2
BAND RUBBER #18 3X1/16 STRL (MISCELLANEOUS) ×4 IMPLANT
BENZOIN TINCTURE PRP APPL 2/3 (GAUZE/BANDAGES/DRESSINGS) IMPLANT
BLADE CLIPPER SURG (BLADE) IMPLANT
BLADE SURG 11 STRL SS (BLADE) ×2 IMPLANT
BUR MATCHSTICK NEURO 3.0 LAGG (BURR) ×2 IMPLANT
BUR PRECISION FLUTE 5.0 (BURR) ×2 IMPLANT
CANISTER SUCT 3000ML PPV (MISCELLANEOUS) ×2 IMPLANT
CNTNR URN SCR LID CUP LEK RST (MISCELLANEOUS) ×1 IMPLANT
CONT SPEC 4OZ STRL OR WHT (MISCELLANEOUS) ×2
COVER WAND RF STERILE (DRAPES) ×2 IMPLANT
DECANTER SPIKE VIAL GLASS SM (MISCELLANEOUS) ×2 IMPLANT
DERMABOND ADVANCED (GAUZE/BANDAGES/DRESSINGS) ×1
DERMABOND ADVANCED .7 DNX12 (GAUZE/BANDAGES/DRESSINGS) ×1 IMPLANT
DRAPE C-ARM 42X72 X-RAY (DRAPES) ×4 IMPLANT
DRAPE C-ARMOR (DRAPES) ×2 IMPLANT
DRAPE LAPAROTOMY 100X72X124 (DRAPES) ×2 IMPLANT
DRAPE MICROSCOPE LEICA (MISCELLANEOUS) ×2 IMPLANT
DRAPE SURG 17X23 STRL (DRAPES) ×2 IMPLANT
DURAPREP 26ML APPLICATOR (WOUND CARE) ×2 IMPLANT
ELECT REM PT RETURN 9FT ADLT (ELECTROSURGICAL) ×2
ELECTRODE REM PT RTRN 9FT ADLT (ELECTROSURGICAL) ×1 IMPLANT
GAUZE 4X4 16PLY RFD (DISPOSABLE) IMPLANT
GAUZE SPONGE 4X4 12PLY STRL (GAUZE/BANDAGES/DRESSINGS) IMPLANT
GLOVE BIOGEL PI IND STRL 7.5 (GLOVE) ×2 IMPLANT
GLOVE BIOGEL PI INDICATOR 7.5 (GLOVE) ×2
GLOVE ECLIPSE 7.5 STRL STRAW (GLOVE) ×4 IMPLANT
GLOVE ECLIPSE 8.0 STRL XLNG CF (GLOVE) ×2 IMPLANT
GLOVE EXAM NITRILE LRG STRL (GLOVE) IMPLANT
GLOVE EXAM NITRILE XL STR (GLOVE) IMPLANT
GLOVE EXAM NITRILE XS STR PU (GLOVE) IMPLANT
GLOVE INDICATOR 7.0 STRL GRN (GLOVE) ×4 IMPLANT
GLOVE SRG 8 PF TXTR STRL LF DI (GLOVE) ×1 IMPLANT
GLOVE SURG SS PI 7.5 STRL IVOR (GLOVE) ×10 IMPLANT
GLOVE SURG UNDER POLY LF SZ8 (GLOVE) ×2
GOWN STRL REUS W/ TWL LRG LVL3 (GOWN DISPOSABLE) ×2 IMPLANT
GOWN STRL REUS W/ TWL XL LVL3 (GOWN DISPOSABLE) ×2 IMPLANT
GOWN STRL REUS W/TWL 2XL LVL3 (GOWN DISPOSABLE) IMPLANT
GOWN STRL REUS W/TWL LRG LVL3 (GOWN DISPOSABLE) ×4
GOWN STRL REUS W/TWL XL LVL3 (GOWN DISPOSABLE) ×4
HEMOSTAT POWDER KIT SURGIFOAM (HEMOSTASIS) ×2 IMPLANT
KIT BASIN OR (CUSTOM PROCEDURE TRAY) ×2 IMPLANT
KIT POSITION SURG JACKSON T1 (MISCELLANEOUS) IMPLANT
KIT TURNOVER KIT B (KITS) ×2 IMPLANT
NEEDLE HYPO 18GX1.5 BLUNT FILL (NEEDLE) IMPLANT
NEEDLE HYPO 22GX1.5 SAFETY (NEEDLE) ×2 IMPLANT
NEEDLE SPNL 18GX3.5 QUINCKE PK (NEEDLE) IMPLANT
NS IRRIG 1000ML POUR BTL (IV SOLUTION) ×2 IMPLANT
PACK LAMINECTOMY NEURO (CUSTOM PROCEDURE TRAY) ×2 IMPLANT
PAD ARMBOARD 7.5X6 YLW CONV (MISCELLANEOUS) ×6 IMPLANT
SPONGE LAP 4X18 RFD (DISPOSABLE) IMPLANT
SPONGE SURGIFOAM ABS GEL 100 (HEMOSTASIS) IMPLANT
STRIP CLOSURE SKIN 1/2X4 (GAUZE/BANDAGES/DRESSINGS) IMPLANT
SUT MNCRL AB 3-0 PS2 18 (SUTURE) ×2 IMPLANT
SUT PROLENE 6 0 BV (SUTURE) IMPLANT
SUT VIC AB 0 CT1 18XCR BRD8 (SUTURE) ×1 IMPLANT
SUT VIC AB 0 CT1 8-18 (SUTURE) ×2
SYR 3ML LL SCALE MARK (SYRINGE) IMPLANT
TOWEL GREEN STERILE (TOWEL DISPOSABLE) ×2 IMPLANT
TOWEL GREEN STERILE FF (TOWEL DISPOSABLE) ×2 IMPLANT
TRAY FOLEY MTR SLVR 16FR STAT (SET/KITS/TRAYS/PACK) ×2 IMPLANT
WATER STERILE IRR 1000ML POUR (IV SOLUTION) ×2 IMPLANT

## 2020-10-22 NOTE — Anesthesia Postprocedure Evaluation (Signed)
Anesthesia Post Note  Patient: Gregory Hodges  Procedure(s) Performed: Lumbar five Open Laminectomy for tumor resection (N/A Spine Lumbar)     Patient location during evaluation: PACU Anesthesia Type: General Level of consciousness: awake Pain management: pain level controlled Vital Signs Assessment: post-procedure vital signs reviewed and stable Respiratory status: spontaneous breathing, nonlabored ventilation, respiratory function stable and patient connected to nasal cannula oxygen Cardiovascular status: blood pressure returned to baseline and stable Postop Assessment: no apparent nausea or vomiting Anesthetic complications: no   No complications documented.  Last Vitals:  Vitals:   10/22/20 1446 10/22/20 1922  BP: 113/83 113/61  Pulse: 63 83  Resp: 18 18  Temp:  36.7 C  SpO2: 90% 92%    Last Pain:  Vitals:   10/22/20 1922  TempSrc: Oral  PainSc:                  Sankalp Ferrell P Aristotelis Vilardi

## 2020-10-22 NOTE — Op Note (Signed)
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 The patient was found to have a small likely pathologic fracture of the L5 vertebral body and epidural tumor spread contact the nerve roots. CT CAP showed a possible pulmonary primary. Given the need for decompression to resolve his symptoms, I recommended a laminectomy and removal of the tumor at that level to both improve his radicular symptoms as well as get tissue for treatment of his underlying malignancy. This was discussed with the patient as well as risks, benefits, and alternatives and wished to proceed with surgery.   OPERATIVE DETAIL: The patient was taken to the operating room and anesthesia was induced by the anesthesia team. They were placed on the OR table in the prone position with padding of all pressure points with careful attention to keep pressure off of his axillo-femoral bypasses. A formal time out was performed with two patient identifiers and confirmed the operative site. The operative site was marked, hair was clipped with surgical clippers, the area was then prepped and draped in a sterile fashion. Fluoro was used to localize the operative level and a midline incision was placed to expose the L5 lamina. Subperiosteal dissection was performed bilaterally and fluoroscopy was again used to confirm the surgical level.   Decompression was performed, which consisted of a right L5 hemilaminectomy and partial right facetectomy. The lateral recess was explored and epidural tumor was encountered. This was removed and sent to pathology until it was flush with the  vertebral body. The epidural space was probed with a ball-tipped probe and no further epidural tumor was palpable. The thecal sac and traversing root were well palpated and well decompressed. Hemostasis was obtained in the epidural space then confirmed from deep to proximal with multiple rounds of irrigation.  Hemostasis was again confirmed, all instrument and sponge counts were correct, the incision was then closed in layers. The patient was then returned to anesthesia for emergence. No apparent complications at the completion of the procedure.   EBL:  24m   DRAINS: none   SPECIMENS: Lumbar epidural tumor   TJudith Part MD 10/22/20 9:48 AM

## 2020-10-22 NOTE — H&P (Signed)
Surgical H&P Update  HPI: 80 y.o. man with h/o RLE radicular symptoms, found to have a pathologic fracture and likely tumor at that level with epidural extension. No changes in health since he was last seen. Still having symptoms and wishes to proceed with surgery.  PMHx:  Past Medical History:  Diagnosis Date  . AAA (abdominal aortic aneurysm) ([facility])    s/p open repair using aortobifemoral graft 03/03/10 ([facility]), complicated by wound dehisence, enterocutaneous fistula; developed aortic graft infection s/p explant of graft and placement of bilateral axillofemoral grafts 07/22/10 [facility])  . Cancer ([facility])    Skin  . Crohn's disease ([place])   . E coli bacteremia   . History of kidney stones   . Hyperlipemia   . Hypertension    "denies"  . Myocardial infarction ([place]) 08/11/2005   s/p Horizon study stent D1  . Peripheral vascular disease [facility]) June 2011  . 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 No pertinent family history. SocHx:  reports that he has been smoking cigarettes. He has a 30.00 pack-year smoking history. He has never used smokeless tobacco. He reports previous alcohol use. He reports that he does not use drugs.  Physical Exam: AOx3, PERRL, FS, TM  Strength 5/5 x4, SILTx4 except dec'd sensation on the R in L5 and S1 dermatomes  Assesment/Plan: 80 y.o. man with likely epidural tumor from unknown primary, here for L5 lami and tumor resection. Risks, benefits, and alternatives discussed and the patient would like to continue with surgery.  -OR today -3C post-op  Judith Part, MD 10/22/20 9:42 AM

## 2020-10-22 NOTE — Brief Op Note (Signed)
10/22/2020  12:15 PM  PATIENT:  Gregory Hodges  80 y.o. male  PRE-OPERATIVE DIAGNOSIS:  Lumbar spine tumor  POST-OPERATIVE DIAGNOSIS:  Lumbar spine tumor  PROCEDURE:  Procedure(s): Lumbar five Open Laminectomy for tumor resection (N/A)  SURGEON:  Surgeon(s) and Role:    * Judith Part, MD - Primary    * Dawley, Theodoro Doing, DO - Assisting  PHYSICIAN ASSISTANT:   ANESTHESIA:   general  EBL:  50 mL   BLOOD ADMINISTERED:none  DRAINS: none   LOCAL MEDICATIONS USED:  LIDOCAINE   SPECIMEN:  Source of Specimen:  Epidural lumbar tumor  DISPOSITION OF SPECIMEN:  PATHOLOGY  COUNTS:  YES  TOURNIQUET:  * No tourniquets in log *  DICTATION: .Note written in EPIC  PLAN OF CARE: Admit to inpatient   PATIENT DISPOSITION:  PACU - hemodynamically stable.   Delay start of Pharmacological VTE agent (>24hrs) due to surgical blood loss or risk of bleeding: yes

## 2020-10-22 NOTE — Transfer of Care (Signed)
Immediate Anesthesia Transfer of Care Note  Patient: Gregory Hodges  Procedure(s) Performed: Lumbar five Open Laminectomy for tumor resection (N/A Spine Lumbar)  Patient Location: PACU  Anesthesia Type:General  Level of Consciousness: drowsy  Airway & Oxygen Therapy: Patient Spontanous Breathing and Patient connected to nasal cannula oxygen  Post-op Assessment: Report given to RN and Post -op Vital signs reviewed and stable  Post vital signs: Reviewed and stable  Last Vitals:  Vitals Value Taken Time  BP 114/57 10/22/20 1234  Temp    Pulse 65 10/22/20 1236  Resp 17 10/22/20 1236  SpO2 96 % 10/22/20 1236  Vitals shown include unvalidated device data.  Last Pain:  Vitals:   10/22/20 0833  TempSrc:   PainSc: 0-No pain         Complications: No complications documented.

## 2020-10-22 NOTE — Addendum Note (Signed)
Addendum  created 10/22/20 2041 by Eligha Bridegroom, CRNA   Intraprocedure Event edited

## 2020-10-22 NOTE — Anesthesia Procedure Notes (Signed)
Procedure Name: Intubation Date/Time: 10/15/2020 10:04 AM Performed by: Eligha Bridegroom, CRNA Pre-anesthesia Checklist: Patient identified, Emergency Drugs available, Suction available, Patient being monitored and Timeout performed Patient Re-evaluated:Patient Re-evaluated prior to induction Oxygen Delivery Method: Circle system utilized Preoxygenation: Pre-oxygenation with 100% oxygen Induction Type: IV induction Ventilation: Mask ventilation without difficulty Laryngoscope Size: Glidescope Grade View: Grade III Tube type: Oral Number of attempts: 2 Airway Equipment and Method: Stylet and Video-laryngoscopy Placement Confirmation: ETT inserted through vocal cords under direct vision and positive ETCO2 Secured at: 22 cm Tube secured with: Tape Dental Injury: Teeth and Oropharynx as per pre-operative assessment

## 2020-10-23 ENCOUNTER — Other Ambulatory Visit: Payer: Self-pay | Admitting: Radiation Therapy

## 2020-10-23 ENCOUNTER — Encounter (HOSPITAL_COMMUNITY): Payer: Self-pay | Admitting: Neurological Surgery

## 2020-10-23 ENCOUNTER — Inpatient Hospital Stay (HOSPITAL_COMMUNITY): Payer: PPO

## 2020-10-23 IMAGING — MR MR LUMBAR SPINE WO/W CM
4 of 7 series · 24 of 48 positions shown · IV contrast (gadavist)
Comparison: Previous MRI from [DATE]

CLINICAL DATA: Follow-up examination status post laminectomy for
tumor resection.

EXAM:
MRI LUMBAR SPINE WITHOUT AND WITH CONTRAST
TECHNIQUE: Multiplanar and multiecho pulse sequences of the lumbar spine were
obtained without and with intravenous contrast.
CONTRAST:  8mL GADAVIST GADOBUTROL 1 MMOL/ML IV SOLN

[Series 5: T2 · sagittal · 4.0mm · 0.73mm/px · 6 of 20 slices shown (1 of 2)]
[im 1/20]
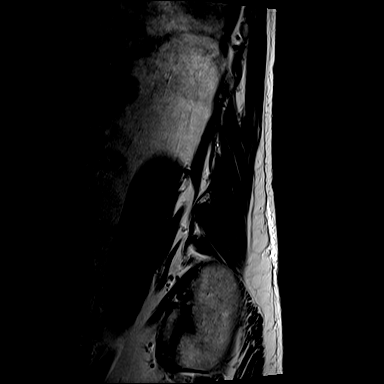
[im 4/20]
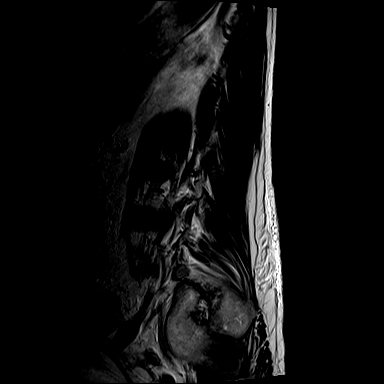
[im 8/20]
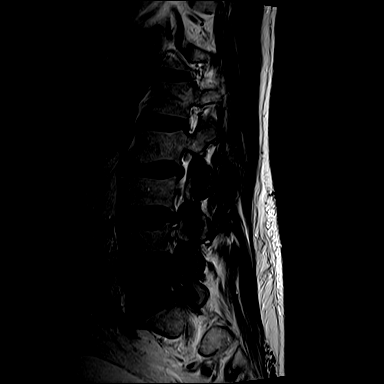
[im 12/20]
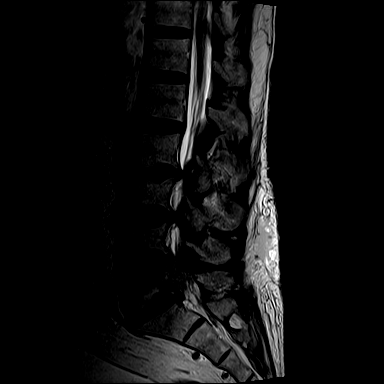
[im 16/20]
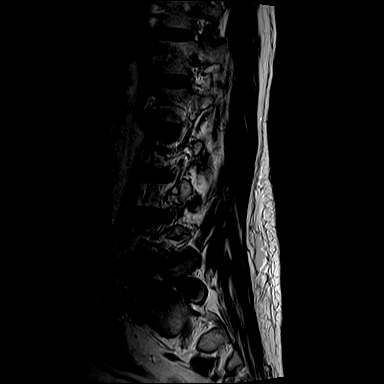
[im 20/20]
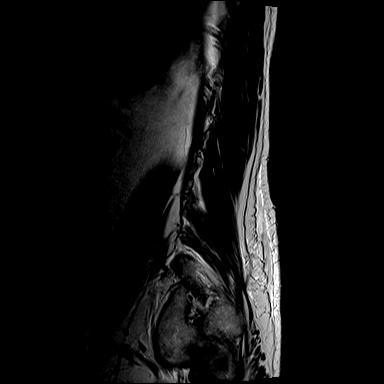

[Series 7: T1 · sagittal · 4.0mm · 0.88mm/px · 5 of 20 slices shown (1 of 2)]
[im 1/20]
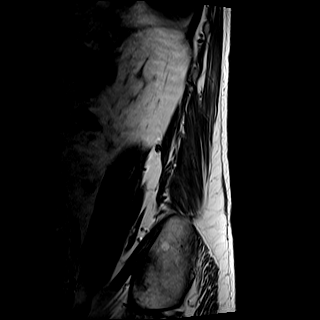
[im 5/20]
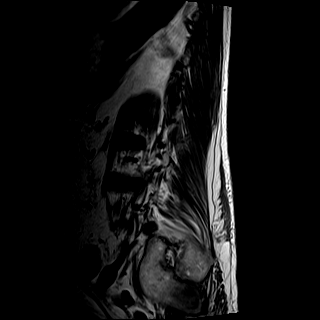
[im 10/20]
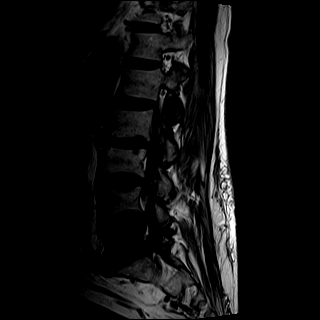
[im 15/20]
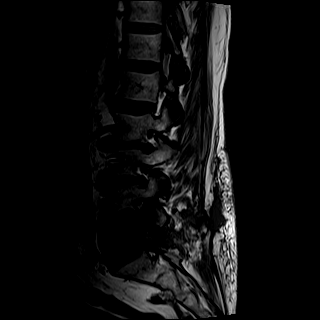
[im 20/20]
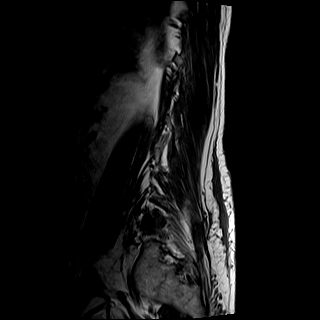

[Series 8: T2 · axial · 4.0mm · 0.57mm/px · z∈[-161,+46]mm · 9 of 37 slices shown (2 of 2)]
[im 1/37]
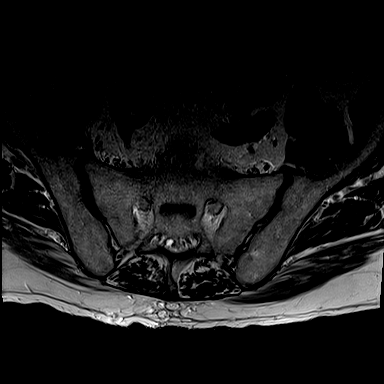
[im 5/37]
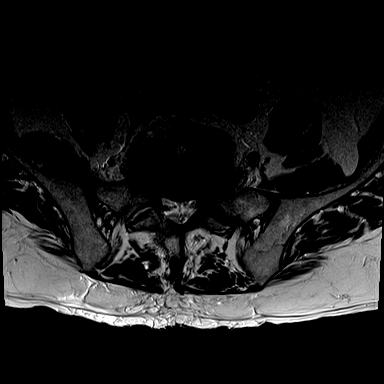
[im 10/37]
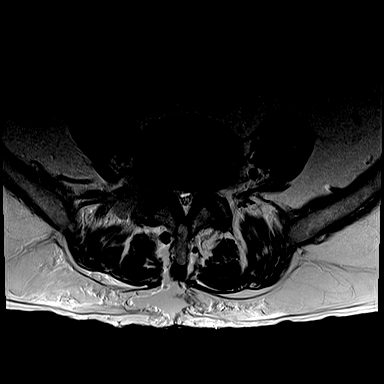
[im 14/37]
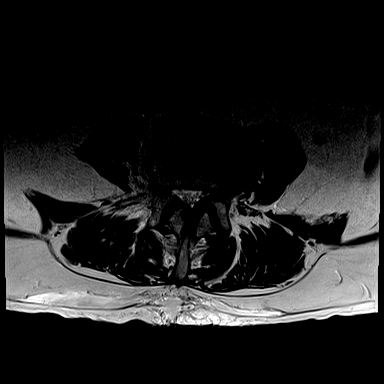
[im 19/37]
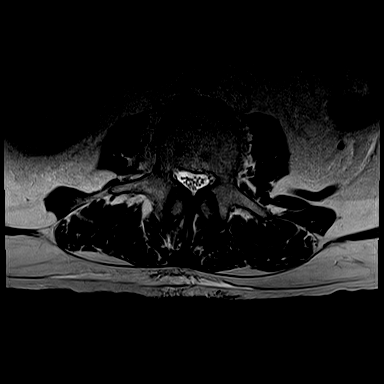
[im 23/37]
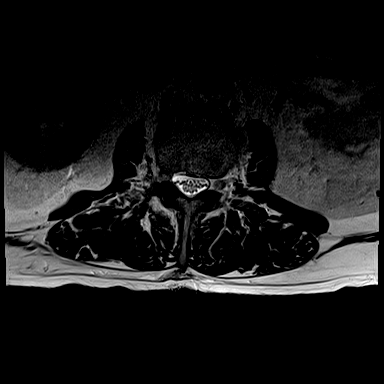
[im 28/37]
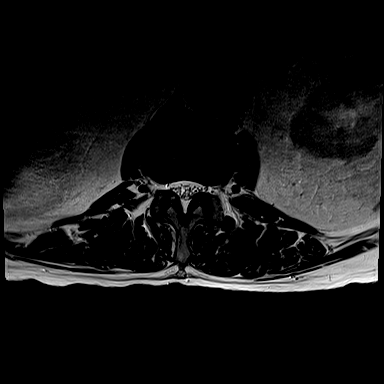
[im 32/37]
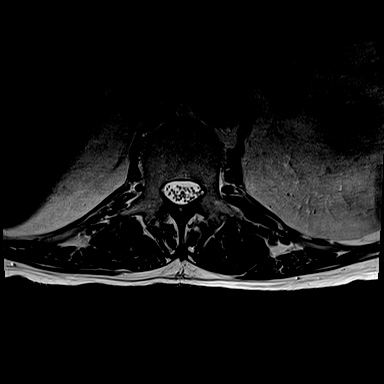
[im 37/37]
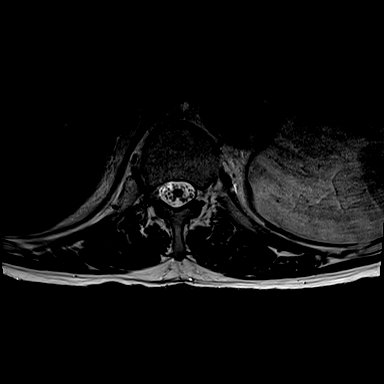

[Series 9: T1 · axial · 4.0mm · 0.34mm/px · z∈[-161,+21]mm · 4 of 37 slices shown (2 of 2)]
[im 1/37]
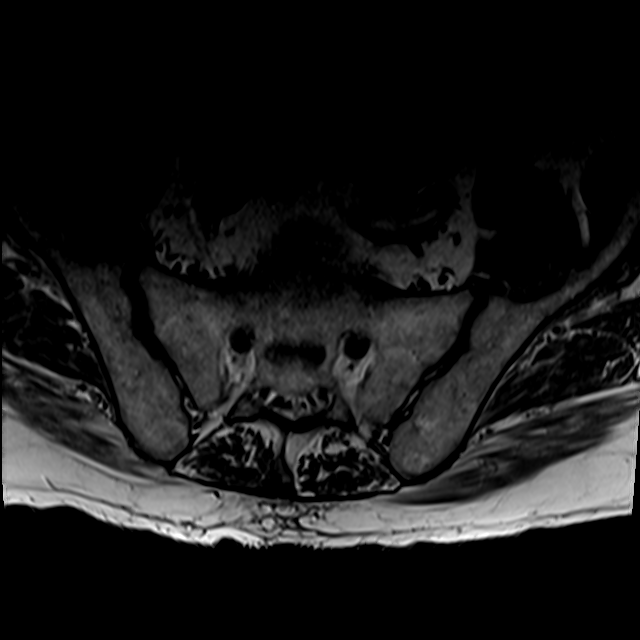
[im 5/37]
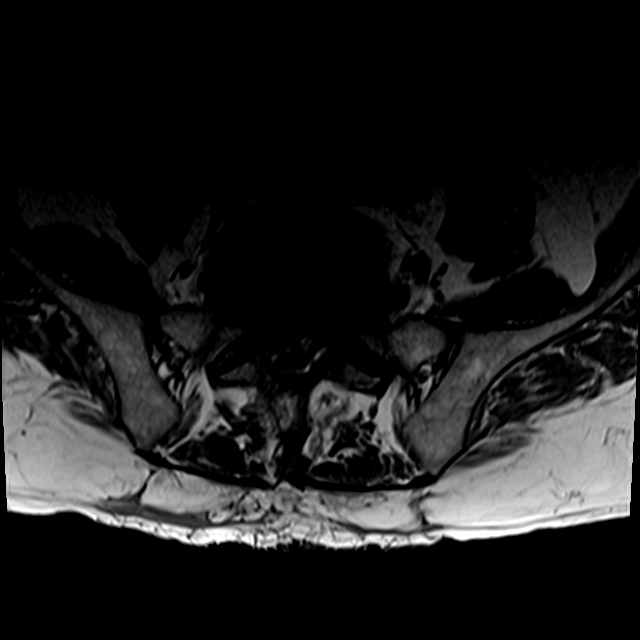
[im 19/37]
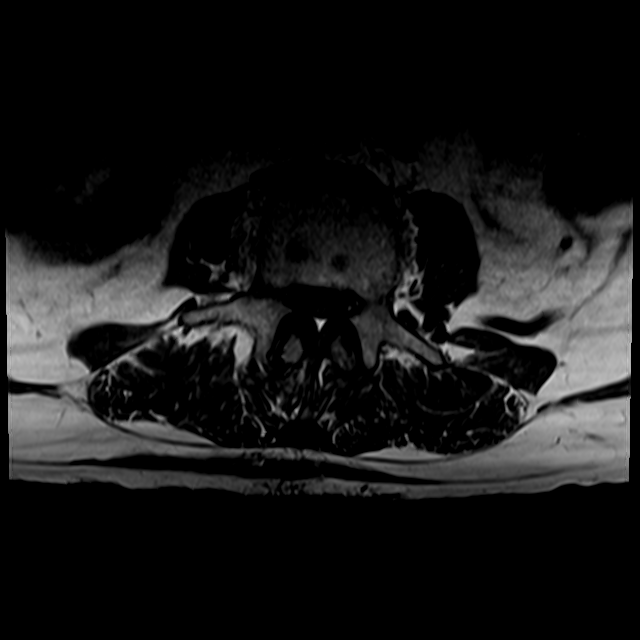
[im 32/37]
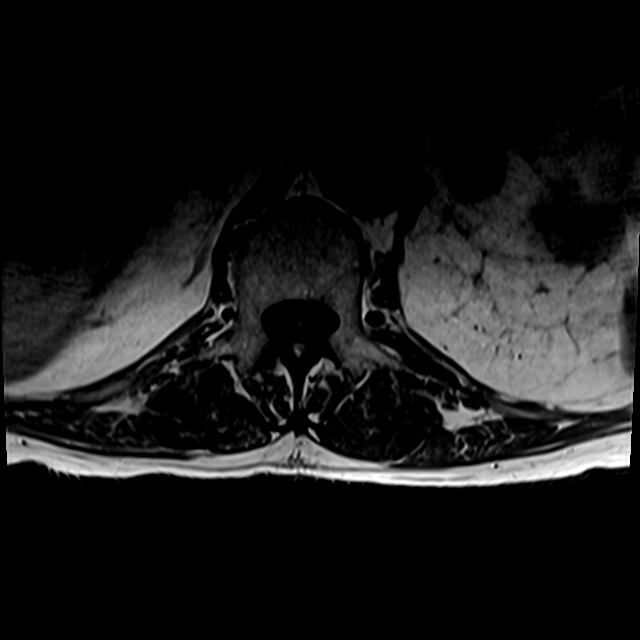

[24 of 48 positions shown; findings below may reference images not displayed]

FINDINGS: Segmentation: Standard. Lowest well-formed disc space labeled the
L5-S1 level.

Alignment: Levoscoliosis. Trace retrolisthesis of L2 on L3, stable.

Vertebrae: Abnormal marrow signal involving the L5 vertebral body
consistent with metastatic disease. Associated mild 20-25% central
height loss consistent with a probable associated pathologic
fracture. Extraosseous extension with tumor extending into the
adjacent epidural space again seen, a portion of which has been
resected since prior. This will be discussed below.

Otherwise, vertebral body heights are maintained with no other acute
or chronic fracture. Bone marrow signal intensity otherwise normal.
No other discrete or worrisome osseous lesions. No other abnormal
marrow edema or enhancement.

Conus medullaris and cauda equina: Conus extends to the L1 level.
Conus and cauda equina appear normal.

Paraspinal and other soft tissues: Postoperative changes from recent
laminectomy noted within the lower posterior paraspinous soft
tissues. No discrete collections or other postoperative
complication. Perivertebral tumor extension again noted about the L5
vertebral body, greater on the right (series 8, image 32).
Diminutive and chronically occluded infrarenal abdominal aorta, with
chronically thrombosed left common iliac artery aneurysm again
noted.

Disc levels:

L1-2:  Negative interspace.  Mild facet hypertrophy.  No stenosis.

L2-3: Diffuse disc bulge with disc desiccation and intervertebral
disc space narrowing. Disc bulge asymmetric to the right. Underlying
moderate facet hypertrophy and short pedicles. Resultant moderate
spinal stenosis with right greater than left lateral recess
narrowing. Mild right L2 foraminal stenosis. Appearance is stable.

L3-4: Mild diffuse disc bulge with disc desiccation, eccentric to
the right. Moderate facet and ligament flavum hypertrophy. Resultant
severe spinal stenosis, with moderate bilateral L3 foraminal
narrowing. Appearance is stable.

L4-5: Disc desiccation with mild diffuse disc bulge. Moderate facet
and ligament flavum hypertrophy. Small amount of epidural tumor
extending from the L5 vertebral body below extends into the inferior
aspect of the right L4-5 neural foramen (series 7, image 5). Partial
extension into the right lateral recess as well (series 8, image
28). Resultant moderate canal with severe right worse than left
lateral recess stenosis. Moderate right with mild left L4 foraminal
stenosis.

L5-S1: Diffuse disc bulge with disc desiccation. Osseous tumor
largely replacing the L5 vertebral body. Evidence for interval right
hemi laminectomy. Previously seen epidural tumor has been partially
resected, most notable at the right lateral recess and right lateral
epidural space. Improved mass effect on the descending right S1
nerve root. There is persistent epidural tumor slightly more
superiorly within the ventral epidural space (series 8, image 30).
Persistent severe spinal stenosis at this level. Thecal sac
compressed to 3 mm in AP diameter, stable. Tumor extends to involve
the right greater than left neural foramina with resultant severe
bilateral foraminal stenosis, right worse than left.
IMPRESSION: 1. Postoperative changes from right hemi laminectomy and partial
resection of epidural tumor at L5-S1. Tumor has been partially
resected, with improved mass effect on the descending right S1 nerve
root, although there is persistent severe spinal stenosis at the
level of L5. Extension to involve the right greater than left neural
foramina with associated severe right worse than left L5 foraminal
stenosis, unchanged.
2. Persistent small amount of epidural tumor extending from the L5
vertebral body below to involve the right lateral recess and neural
foramen at L4-5, stable.
3. Probable underlying pathologic fracture of L5 with up to 20-25%
central height loss.
4. Underlying acquired on congenital multilevel lumbar spondylosis
as above, otherwise stable from previous exam.

## 2020-10-23 MED ORDER — GADOBUTROL 1 MMOL/ML IV SOLN
8.0000 mL | Freq: Once | INTRAVENOUS | Status: AC | PRN
  Administered 2020-10-23: 8 mL via INTRAVENOUS

## 2020-10-23 NOTE — Discharge Instructions (Signed)
Discharge Instructions  No restriction in activities, slowly increase your activity back to normal.   Your incision is closed with dermabond (purple glue). This will naturally fall off over the next 1-2 weeks.   Okay to shower on the day of discharge. Use regular soap and water and try to be gentle when cleaning your incision.   Follow up with Dr. Zada Finders in 2 weeks after discharge. If you do not already have a discharge appointment, please call his office at [phone] to schedule a follow up appointment. If you have any concerns or questions, please call the office and let us know.  We will discuss your case at tumor board on Monday and contact you afterwards to discuss the pathology results and treatment plan.

## 2020-10-23 NOTE — Evaluation (Signed)
Physical Therapy Evaluation Patient Details Name: Gregory Hodges MRN: [id number] DOB: [birth year] Today's Date: 10/23/2020   History of Present Illness  Pt is a 80 y/o male who presents s/p L5 laminectomy and epidural tumor resection on 10/22/2020.  Clinical Impression  Patient evaluated by Physical Therapy with no further acute PT needs identified. All education has been completed and the patient has no further questions. Pt was able to demonstrate transfers and ambulation with gross supervision to modified independence and no AD. Pt was educated on precautions, brace application/wearing schedule, appropriate activity progression, and car transfer. See below for any follow-up Physical Therapy or equipment needs. PT is signing off. Thank you for this referral.     Follow Up Recommendations No PT follow up;Supervision - Intermittent    Equipment Recommendations  None recommended by PT    Recommendations for Other Services       Precautions / Restrictions Precautions Precautions: Fall;Back Precaution Booklet Issued: Yes (comment) Precaution Comments: Reviewed handout. Cues for adherence with back precautions during ADLs and ADL transfers. Required Braces or Orthoses:  (No brace needed order) Restrictions Weight Bearing Restrictions: No      Mobility  Bed Mobility              General bed mobility comments: Seated in recliner upon entry.    Transfers Overall transfer level: Modified independent Equipment used: None Transfers: Sit to/from Stand           General transfer comment: No assist required. Pt demonstrated proper hand placement on seated surface for safety.  Ambulation/Gait Ambulation/Gait assistance: Supervision Gait Distance (Feet): 275 Feet Assistive device: None Gait Pattern/deviations: Step-through pattern;Decreased stride length;Trunk flexed Gait velocity: Decreased Gait velocity interpretation: 1.31 - 2.62 ft/sec, indicative of limited community  ambulator General Gait Details: VC's for improved posture. Occasional lateral stagger/side step but no assist required to recover.  Stairs Stairs: Yes Stairs assistance: Supervision Stair Management: One rail Right;Alternating pattern;Forwards Number of Stairs: 10 General stair comments: No assist required.  Wheelchair Mobility    Modified Rankin (Stroke Patients Only)       Balance Overall balance assessment: Needs assistance Sitting-balance support: Single extremity supported;Feet supported Sitting balance-Leahy Scale: Fair     Standing balance support: No upper extremity supported;During functional activity Standing balance-Leahy Scale: Fair Standing balance comment: Completed 2/3 grooming tasks standing at sink without UE support. No overt LOB.                             Pertinent Vitals/Pain Pain Assessment: Faces Faces Pain Scale: Hurts a little bit Pain Location: Back Pain Descriptors / Indicators: Operative site guarding Pain Intervention(s): Limited activity within patient's tolerance;Repositioned    Home Living Family/patient expects to be discharged to:: Private residence Living Arrangements: Spouse/significant other Available Help at Discharge: Family;Available 24 hours/day Type of Home: House Home Access: Stairs to enter   [employer] of Steps: 2-3 from the garage Home Layout: Two level Home Equipment: [place] - 2 wheels;Cane - single point;Bedside commode;Shower seat;Grab bars - tub/shower (RW x2) Additional Comments: Education on use of walk-in shower with shower chair upon d/c to maximize adherence to back precautions.    Prior Function Level of Independence: Independent         Comments: Shared responsibility with IADLs including cooking/cleaning. Patient was driving.     Hand Dominance   Dominant Hand: Right    Extremity/Trunk Assessment   Upper Extremity Assessment Upper Extremity Assessment: Overall  WFL for  tasks assessed    Lower Extremity Assessment Lower Extremity Assessment: Defer to PT evaluation    Cervical / Trunk Assessment Cervical / Trunk Assessment: Other exceptions Cervical / Trunk Exceptions: s/p surgery  Communication   Communication: No difficulties  Cognition Arousal/Alertness: Awake/alert Behavior During Therapy: WFL for tasks assessed/performed Overall Cognitive Status: Within Functional Limits for tasks assessed                                        General Comments General comments (skin integrity, edema, etc.): Visibly large AAA s/p repair.    Exercises     Assessment/Plan    PT Assessment Patent does not need any further PT services  PT Problem List         PT Treatment Interventions      PT Goals (Current goals can be found in the Care Plan section)  Acute Rehab PT Goals Patient Stated Goal: To return home. PT Goal Formulation: All assessment and education complete, DC therapy    Frequency     Barriers to discharge        Co-evaluation               AM-PAC PT "6 Clicks" Mobility  Outcome Measure Help needed turning from your back to your side while in a flat bed without using bedrails?: None Help needed moving from lying on your back to sitting on the side of a flat bed without using bedrails?: None Help needed moving to and from a bed to a chair (including a wheelchair)?: None Help needed standing up from a chair using your arms (e.g., wheelchair or bedside chair)?: None Help needed to walk in hospital room?: A Little Help needed climbing 3-5 steps with a railing? : A Little 6 Click Score: 22    End of Session   Activity Tolerance: Patient tolerated treatment well Patient left: in chair;with family/visitor present Nurse Communication: Mobility status PT Visit Diagnosis: Unsteadiness on feet (R26.81)    Time: 1448-1856 PT Time Calculation (min) (ACUTE ONLY): 18 min   Charges:   PT Evaluation $PT Eval Low  Complexity: 1 Low          Rolinda Roan, PT, DPT Acute Rehabilitation Services Pager: [phone] Office: [phone]   Thelma Comp 10/23/2020, 9:43 AM

## 2020-10-23 NOTE — Progress Notes (Signed)
Neurosurgery Service Progress Note  Subjective: No acute events overnight, preop leg pain and numbness resolved   Objective: Vitals:   10/22/20 1922 10/22/20 2311 10/23/20 0326 10/23/20 0806  BP: 113/61 129/63 (!) 149/70 132/80  Pulse: 83 71 69 79  Resp: 18 18 18 18   Temp: 98.1 F (36.7 C) 98.1 F (36.7 C) 98.2 F (36.8 C) 97.7 F (36.5 C)  TempSrc: Oral Oral Oral Oral  SpO2: 92% 90% 93% 93%  Weight:      Height:        Physical Exam: AOx3, PERRL, EOMI, FS, TM, Strength 5/5 x4, SILTx4, incision c/d/i  Assessment & Plan: 80 y.o. man s/p R L5 lami for epidural tumor resection, recovering well.  -symptoms improved, MRI unfortunately with a lot of tumor burden. 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

## 2020-10-23 NOTE — Discharge Summary (Signed)
Discharge Summary  Date of Admission: 10/22/2020  Date of Discharge: 10/23/20  Attending Physician: Emelda Brothers, MD  Hospital Course: Patient was admitted following an uncomplicated L5 laminectomy and epidural tumor resection. He was recovered in PACU and transferred to [facility]. His preop radicular pain and numbness was resolved immediately post-op. A post-op MRI showed partial resection of the tumor but with large residual and continued stenosis. His hospital course was uncomplicated and the patient was discharged home on POD1. He will follow up in clinic with me in 2 weeks. Final pathology was pending at the time of discharge, will discuss at tumor board and update the patient accordingly regarding treatment of his systemic disease.   Neurologic exam at discharge:  AOx3, PERRL, EOMI, FS, TM Strength 5/5 x4, SILTx4  Discharge diagnosis: Lumbar epidural spine tumor  Judith Part, MD 10/23/20 9:24 AM

## 2020-10-23 NOTE — Evaluation (Signed)
Occupational Therapy Evaluation Patient Details Name: Gregory Hodges MRN: [id number] DOB: 1941-07-11 Today's Date: 10/23/2020    History of Present Illness Pt is a 80 y/o male who presents s/p L5 laminectomy and epidural tumor resection on 10/22/2020.   Clinical Impression   PTA patient was living in a private residence with his wife and was independent with ADLs/IADLs without AD. Shared responsibility for cooking/cleaning tasks. Patient currently presents near baseline level of function demonstrating Mod I overall for observed ADLs. OT provided education on spinal precautions, home set-up to maximize safety and independence with self-care tasks, and acquisition/use of AE. Patient expressed verbal understanding. Patient does not require continued acute occupational therapy services with OT to sign off at this time.       Follow Up Recommendations  No OT follow up;Supervision - Intermittent    Equipment Recommendations  None recommended by OT (Patient has necessary DME.)    Recommendations for Other Services       Precautions / Restrictions Precautions Precautions: Fall;Back Precaution Booklet Issued: Yes (comment) Precaution Comments: Reviewed handout. Cues for adherence with back precautions during ADLs and ADL transfers. Required Braces or Orthoses:  (No brace needed order) Restrictions Weight Bearing Restrictions: No      Mobility Bed Mobility Overal bed mobility: Needs Assistance             General bed mobility comments: Seated in recliner upon entry.    Transfers Overall transfer level: Modified independent Equipment used: None Transfers: Sit to/from Stand           General transfer comment: No assist required. Pt demonstrated proper hand placement on seated surface for safety.    Balance Overall balance assessment: Needs assistance Sitting-balance support: Single extremity supported;Feet supported Sitting balance-Leahy Scale: Fair     Standing balance  support: No upper extremity supported;During functional activity Standing balance-Leahy Scale: Fair Standing balance comment: Completed 2/3 grooming tasks standing at sink without UE support. No overt LOB.                           ADL either performed or assessed with clinical judgement   ADL Overall ADL's : Modified independent                                       General ADL Comments: Patient dressed upon entry. Reports use of figure-4 position to don LB clothing seated EOB. Able to doff/don footwear without external assist.     Vision Baseline Vision/History: Wears glasses Wears Glasses: At all times Patient Visual Report: No change from baseline Vision Assessment?: No apparent visual deficits     Perception     Praxis      Pertinent Vitals/Pain Pain Assessment: Faces Faces Pain Scale: Hurts a little bit Pain Location: Back Pain Descriptors / Indicators: Operative site guarding Pain Intervention(s): Limited activity within patient's tolerance;Repositioned     Hand Dominance Right   Extremity/Trunk Assessment Upper Extremity Assessment Upper Extremity Assessment: Overall WFL for tasks assessed   Lower Extremity Assessment Lower Extremity Assessment: Defer to PT evaluation   Cervical / Trunk Assessment Cervical / Trunk Assessment: Other exceptions Cervical / Trunk Exceptions: s/p surgery   Communication Communication Communication: No difficulties   Cognition Arousal/Alertness: Awake/alert Behavior During Therapy: WFL for tasks assessed/performed Overall Cognitive Status: Within Functional Limits for tasks assessed  General Comments  Visibly large AAA s/p repair.    Exercises     Shoulder Instructions      Home Living Family/patient expects to be discharged to:: Private residence Living Arrangements: Spouse/significant other Available Help at Discharge: Family;Available 24  hours/day Type of Home: House Home Access: Stairs to enter [employer] of Steps: 2-3 from the garage   Home Layout: Two level Alternate Level Stairs-Number of Steps: 14   Bathroom Shower/Tub: Walk-in shower;Tub/shower unit   Bathroom Toilet: Standard     Home Equipment: Environmental consultant - 2 wheels;Cane - single point;Bedside commode;Shower seat;Grab bars - tub/shower (RW x2)   Additional Comments: Education on use of walk-in shower with shower chair upon d/c to maximize adherence to back precautions.      Prior Functioning/Environment Level of Independence: Independent        Comments: Shared responsibility with IADLs including cooking/cleaning. Patient was driving.        OT Problem List: Decreased strength      OT Treatment/Interventions:      OT Goals(Current goals can be found in the care plan section) Acute Rehab OT Goals Patient Stated Goal: To return home. OT Goal Formulation: With patient  OT Frequency:     Barriers to D/C:            Co-evaluation              AM-PAC OT "6 Clicks" Daily Activity     Outcome Measure Help from another person eating meals?: None Help from another person taking care of personal grooming?: None Help from another person toileting, which includes using toliet, bedpan, or urinal?: None Help from another person bathing (including washing, rinsing, drying)?: A Little Help from another person to put on and taking off regular upper body clothing?: None Help from another person to put on and taking off regular lower body clothing?: None 6 Click Score: 23   End of Session Equipment Utilized During Treatment: Gait belt Nurse Communication: Mobility status  Activity Tolerance: Patient tolerated treatment well Patient left: in chair;with call bell/phone within reach  OT Visit Diagnosis: Muscle weakness (generalized) (M62.81);Pain Pain - part of body:  (Back (incisional))                Time: 3748-2707 OT Time Calculation  (min): 19 min Charges:  OT General Charges $OT Visit: 1 Visit OT Evaluation $OT Eval Low Complexity: 1 Low  Destanae H. OTR/L Supplemental OT, Department of rehab services [phone]  Destanae R H. 10/23/2020, 9:33 AM

## 2020-10-23 NOTE — Plan of Care (Signed)
Pt and wife given D/C instructions with verbal understanding. Pt's incision is clean and dry with no sign of infection. Pt's IV was removed prior to D/C. Pt D/C'd home via wheelchair per MD order. Pt is stable @ D/C and has no other needs at this time. Holli Humbles, RN

## 2020-10-24 LAB — SURGICAL PATHOLOGY

## 2020-10-28 ENCOUNTER — Inpatient Hospital Stay: Payer: PPO | Attending: Neurological Surgery

## 2020-10-30 NOTE — Progress Notes (Signed)
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 Tumor has been partially resected, with improved mass effect on the descending right S1 nerve root, although there is persistent severe spinal stenosis at the level of L5.  Persistent small amount of epidural tumor extending from the L5 vertebral body below to involve the right lateral recess and neural foramen at L4-5, stable.  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 The previously seen disease in the L5 vertebral body is again noted also related to metastatic disease. There are bilateral rib fractures with permeative pattern and associated soft tissue prominence consistent with metastatic disease and pathologic fracture.  Pathology: L5 tumor 10/22/2020   Past/Anticipated chemotherapy by neurosurgery, if any: Dr. Zada Finders 10/22/2020 -RLE radicular symptoms, found to have a pathologic fracture and likely tumor at that level with epidural extension. -R L5 lami for epidural tumor resection 10/22/2020 -MRI unfortunately with a lot of tumor burden. 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 -Follow-up appointment next week.   Past/Anticipated chemotherapy by medical oncology, if any:   Pain on a scale of 0-10 is:  2/10 lower back   If Spine Met(s), symptoms, if any, include:  Bowel/Bladder retention/incontinence: No  Numbness or weakness in extremities: Much better since surgery  Current Decadron regimen, if applicable: No  Ambulatory status? Walker? Wheelchair?: Ambulatory  SAFETY ISSUES:  Prior radiation?  No  Pacemaker/ICD? No  Possible current pregnancy? n/a   Is the patient on methotrexate? No  Current Complaints / other details:

## 2020-10-31 ENCOUNTER — Ambulatory Visit
Admission: RE | Admit: 2020-10-31 | Discharge: 2020-10-31 | Disposition: A | Discharge status: Home / Self Care | Payer: PPO | Source: Ambulatory Visit | Attending: Radiation Oncology | Admitting: Radiation Oncology

## 2020-10-31 ENCOUNTER — Encounter: Payer: Self-pay | Admitting: Radiation Oncology

## 2020-10-31 VITALS — BP 112/69 | HR 79 | Temp 97.9°F | Resp 18 | Ht 69.0 in | Wt 174.1 lb

## 2020-10-31 DIAGNOSIS — C7951 Secondary malignant neoplasm of bone: Secondary | ICD-10-CM | POA: Insufficient documentation

## 2020-10-31 DIAGNOSIS — Z510 Encounter for antineoplastic radiation therapy: Secondary | ICD-10-CM | POA: Insufficient documentation

## 2020-10-31 DIAGNOSIS — C3490 Malignant neoplasm of unspecified part of unspecified bronchus or lung: Secondary | ICD-10-CM | POA: Diagnosis not present

## 2020-11-01 ENCOUNTER — Telehealth: Payer: Self-pay | Admitting: Physician Assistant

## 2020-11-01 ENCOUNTER — Other Ambulatory Visit: Payer: Self-pay | Admitting: Radiation Oncology

## 2020-11-01 ENCOUNTER — Telehealth: Payer: Self-pay | Admitting: *Deleted

## 2020-11-01 ENCOUNTER — Encounter: Payer: Self-pay | Admitting: *Deleted

## 2020-11-01 DIAGNOSIS — C3490 Malignant neoplasm of unspecified part of unspecified bronchus or lung: Secondary | ICD-10-CM

## 2020-11-01 DIAGNOSIS — C3411 Malignant neoplasm of upper lobe, right bronchus or lung: Secondary | ICD-10-CM

## 2020-11-01 DIAGNOSIS — R918 Other nonspecific abnormal finding of lung field: Secondary | ICD-10-CM

## 2020-11-01 NOTE — Progress Notes (Signed)
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   HISTORY OF PRESENT ILLNESS: Gregory Hodges is a 80 y.o. male seen at the request of Dr. 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  Patient was in his usual state of health but has a history of significant peripheral vascular disease.  He recently presented neurosurgery evaluation due to radicular symptoms to the right lower extremity and was found on outpatient imaging to have a pathologic fracture and tumor at the level of L5 with epidural extension.  He underwent staging scans of the chest abdomen pelvis on 10/16/2020 which revealed concerns for a pulmonary neoplasm in the hilum with metastatic adenopathy, bilateral adrenal metastases and concern for L5 vertebral body disease.  He underwent L5 open laminectomy for tumor resection on 10/22/2020.  Dr. Venetia Constable discussed his case in brain and spine oncology conference, unfortunately there was still visible tumor at the time of his procedure which was not felt to be safe to remove at the time.  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  Postoperative imaging with an MRI of the lumbar spine with and without contrast on 10/23/2020 showing partial resection of the epidural tumor at L5-S1 with improved mass-effect on the descending right S1 nerve root although persistent spinal stenosis was identified, there was still extension to involve the right greater than left neural foramen with associated severe right greater than left L5 foraminal stenosis, persistent epidural extension from the L5  vertebral body below to involve the right lateral recess and neural foramen was stable, probable underlying pathologic fracture of L5 up to 20 to 25% height loss centrally was again seen.  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  Given these findings he has been referred to see [place] to discuss palliative postoperative radiotherapy.  He is not met with medical oncology or had PET or brain MRI work-up.     PREVIOUS RADIATION THERAPY: No   PAST MEDICAL HISTORY:  Past Medical History:  Diagnosis Date  . 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 Cancer (HCC)    Skin  . Crohn's disease ([place])   . E coli bacteremia   . History of kidney stones   . Hyperlipemia   . Hypertension    "denies"  . Myocardial infarction ([place]) 08/11/2005   s/p Horizon study stent D1  . Peripheral vascular disease [facility]) June 2011  . 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 ABDOMINAL AORTIC ANEURYSM REPAIR  03/03/2010  . AXILLARY-FEMORAL BYPASS GRAFT  07/22/2010   bilateral  . bowel     herniated bowel  . CARDIAC CATHETERIZATION    . CORONARY STENT PLACEMENT    . CYSTOSCOPY    . LAMINECTOMY N/A 10/22/2020   Procedure: Lumbar five Open Laminectomy for tumor  resection;  Surgeon: Judith Part, MD;  Location: [place];  Service: Neurosurgery;  Laterality: N/A;  . MOHS SURGERY     several      FAMILY HISTORY: History reviewed. No pertinent family history.   SOCIAL HISTORY:  reports that he has been smoking cigarettes. He has a 30.00 pack-year smoking history. He has never used smokeless tobacco. He reports previous alcohol use. He reports that he does not use  drugs.   ALLERGIES: Oxycodone, Codeine, and Lisinopril   MEDICATIONS:  Current Outpatient Medications  Medication Sig Dispense Refill  . acetaminophen (TYLENOL) 500 MG tablet Take 500 mg by mouth every 6 (six) hours as needed for moderate pain.    Marland Kitchen aspirin 81 MG tablet Take 81 mg by mouth daily.    Marland Kitchen atenolol (TENORMIN) 25 MG tablet Take 25 mg by mouth 2 (two) times daily.    . calcium carbonate (TUMS - DOSED IN MG ELEMENTAL CALCIUM) 500 MG chewable tablet Chew 2 tablets by mouth daily as needed for indigestion or heartburn.    . diphenhydrAMINE (BENADRYL) 25 MG tablet Take 50 mg by mouth daily as needed for allergies.    . ferrous sulfate 325 (65 FE) MG tablet Take 325 mg by mouth every Monday, Wednesday, and Friday.    . nitroGLYCERIN (NITROSTAT) 0.4 MG SL tablet Place 0.4 mg under the tongue every 5 (five) minutes as needed for chest pain.    Marland Kitchen omeprazole (PRILOSEC) 10 MG capsule Take 10 mg by mouth daily.    Marland Kitchen oxymetazoline (AFRIN) 0.05 % nasal spray Place 1 spray into both nostrils 2 (two) times daily as needed for congestion.    . rosuvastatin (CRESTOR) 10 MG tablet Take 10 mg by mouth daily.    Marland Kitchen sulfamethoxazole-trimethoprim (BACTRIM DS) 800-160 MG per tablet Take 1 tablet by mouth 2 (two) times daily. 800-160 mg 60 tablet 11   No current facility-administered medications for this encounter.     REVIEW OF SYSTEMS: On review of systems, the patient reports that he is very frustrated by this diagnosis and was not aware of his pathology prior to contacting him from our office to schedule this appointment.  We reviewed the diagnosis and work-up per NCCN guidelines but the patient was very hesitant to consider discussing details about staging work-up, or additional treatment outside of radiotherapy.  He reports that he has not had any difficulty with significant weight loss, shortness of breath or chest pain.  He does have a chronic cough but denies any progression of this or productive  mucus or hemoptysis.  He denies fevers or chills.  He does have difficulty with lower extremity claudication that limits him from being able to be as physically active as he used to be however this has not changed.  Since his surgery he reports significant improvement in the radicular symptoms he had previously had and denies any foot drop.  He has had slight numbness and tingling at times of his right lower extremity though this is much improved since surgery.  No other complaints are verbalized.      PHYSICAL EXAM:  Wt Readings from Last 3 Encounters:  10/31/20 174 lb 2 oz (79 kg)  10/22/20 175 lb 0.7 oz (79.4 kg)  10/18/20 175 lb (79.4 kg)   Temp Readings from Last 3 Encounters:  10/31/20 97.9 F (36.6 C) (Temporal)  10/23/20 97.7 F (36.5 C) (Oral)  10/18/20 (!) 97.5 F (36.4 C) (Oral)   BP Readings from Last 3 Encounters:  10/31/20 112/69  10/23/20 132/80  10/18/20 (!) 105/53   Pulse Readings from Last 3 Encounters:  10/31/20 79  10/23/20 79  10/18/20 66   Pain Assessment Pain Score: 2  Pain Loc: Back/10  In general this is a well appearing Caucasian male in no acute distress. He's alert and oriented x4 and appropriate throughout the examination. Cardiopulmonary assessment is negative for acute distress and he exhibits normal effort.  He has a large abdominal wall defect in the right mid abdomen seen all closed, he refers to the site as a complication from prior vascular surgeries.   ECOG = 1  0 - Asymptomatic (Fully active, able to carry on all predisease activities without restriction)  1 - Symptomatic but completely ambulatory (Restricted in physically strenuous activity but ambulatory and able to carry out work of a light or sedentary nature. For example, light housework, office work)  2 - Symptomatic, <50% in bed during the day (Ambulatory and capable of all self care but unable to carry out any work activities. Up and about more than 50% of waking hours)  3 -  Symptomatic, >50% in bed, but not bedbound (Capable of only limited self-care, confined to bed or chair 50% or more of waking hours)  4 - Bedbound (Completely disabled. Cannot carry on any self-care. Totally confined to bed or chair)  5 - Death   Eustace Pen MM, Creech RH, Tormey DC, et al. [phone]). "Toxicity and response criteria of the [facility]". [place] Oncol. 5 (6): 649-55    LABORATORY DATA:  Lab Results  Component Value Date   WBC 9.9 10/18/2020   HGB 17.0 10/18/2020   HCT 55.3 (H) 10/18/2020   MCV 98.0 10/18/2020   PLT 145 (L) 10/18/2020   Lab Results  Component Value Date   NA 140 10/18/2020   K 5.4 (H) 10/18/2020   CL 102 10/18/2020   CO2 28 10/18/2020   Lab Results  Component Value Date   ALT 10 10/18/2020   AST 13 (L) 10/18/2020   ALKPHOS 56 10/18/2020   BILITOT 0.6 10/18/2020      RADIOGRAPHY: DG Lumbar Spine 2-3 Views  Result Date: 10/22/2020 CLINICAL DATA:  L5 laminectomy tumor resection. L5 metastasis with soft tissue encroachment into the spinal canal. EXAM: OPERATIVE LUMBAR SPINE L5 VIEW(S) COMPARISON:  None. FINDINGS: Single intraoperative view is submitted. Surgical probe is directed at the L5 spinous process. Soft tissue retractors are in place. IMPRESSION: Intraoperative localization of L5. Electronically Signed   By: San Morelle M.D.   On: 10/22/2020 12:55   MR Lumbar Spine W Wo Contrast  Result Date: 10/23/2020 CLINICAL DATA:  Follow-up examination status post laminectomy for tumor resection. EXAM: MRI LUMBAR SPINE WITHOUT AND WITH CONTRAST TECHNIQUE: Multiplanar and multiecho pulse sequences of the lumbar spine were obtained without and with intravenous contrast. CONTRAST:  63m GADAVIST GADOBUTROL 1 MMOL/ML IV SOLN COMPARISON:  Previous MRI from 10/11/2020 FINDINGS: Segmentation: Standard. Lowest well-formed disc space labeled the L5-S1 level. Alignment: Levoscoliosis. Trace retrolisthesis of L2 on L3, stable. Vertebrae:  Abnormal marrow signal involving the L5 vertebral body consistent with metastatic disease. Associated mild 20-25% central height loss consistent with a probable associated pathologic fracture. Extraosseous extension with tumor extending into the adjacent epidural space again seen, a portion of which has been resected since prior. This will be discussed below. Otherwise, vertebral body heights are maintained with no other acute or chronic fracture. Bone marrow signal intensity otherwise normal. No other discrete or  worrisome osseous lesions. No other abnormal marrow edema or enhancement. Conus medullaris and cauda equina: Conus extends to the L1 level. Conus and cauda equina appear normal. Paraspinal and other soft tissues: Postoperative changes from recent laminectomy noted within the lower posterior paraspinous soft tissues. No discrete collections or other postoperative complication. Perivertebral tumor extension again noted about the L5 vertebral body, greater on the right (series 8, image 32). 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 Disc levels: L1-2:  Negative interspace.  Mild facet hypertrophy.  No stenosis. L2-3: Diffuse disc bulge with disc desiccation and intervertebral disc space narrowing. Disc bulge asymmetric to the right. Underlying moderate facet hypertrophy and short pedicles. Resultant moderate spinal stenosis with right greater than left lateral recess narrowing. Mild right L2 foraminal stenosis. Appearance is stable. L3-4: Mild diffuse disc bulge with disc desiccation, eccentric to the right. Moderate facet and ligament flavum hypertrophy. Resultant severe spinal stenosis, with moderate bilateral L3 foraminal narrowing. Appearance is stable. L4-5: Disc desiccation with mild diffuse disc bulge. Moderate facet and ligament flavum hypertrophy. Small amount of epidural tumor extending from the L5  vertebral body below extends into the inferior aspect of the right L4-5 neural foramen (series 7, image 5). Partial extension into the right lateral recess as well (series 8, image 28). Resultant moderate canal with severe right worse than left lateral recess stenosis. Moderate right with mild left L4 foraminal stenosis. L5-S1: Diffuse disc bulge with disc desiccation. Osseous tumor largely replacing the L5 vertebral body. Evidence for interval right hemi laminectomy. Previously seen epidural tumor has been partially resected, most notable at the right lateral recess and right lateral epidural space. Improved mass effect on the descending right S1 nerve root. There is persistent epidural tumor slightly more superiorly within the ventral epidural space (series 8, image 30). Persistent severe spinal stenosis at this level. Thecal sac compressed to 3 mm in AP diameter, stable. Tumor extends to involve the right greater than left neural foramina with resultant severe bilateral foraminal stenosis, right worse than left. IMPRESSION: 1. Postoperative changes from right hemi laminectomy and partial resection of epidural tumor at L5-S1. Tumor has been partially resected, with improved mass effect on the descending right S1 nerve root, although there is persistent severe spinal stenosis at the level of L5. Extension to involve the right greater than left neural foramina with associated severe right worse than left L5 foraminal stenosis, unchanged. 2. Persistent small amount of epidural tumor extending from the L5 vertebral body below to involve the right lateral recess and neural foramen at L4-5, stable. 3. Probable underlying pathologic fracture of L5 with up to 20-25% central height loss. 4. Underlying acquired on congenital multilevel lumbar spondylosis as above, otherwise stable from previous exam. Electronically Signed   By: Jeannine Boga M.D.   On: 10/23/2020 03:17   CT CHEST ABDOMEN PELVIS W CONTRAST  Result  Date: 10/16/2020 CLINICAL DATA:  L5 replacement, possible metastatic disease EXAM: CT CHEST, ABDOMEN, AND PELVIS WITH CONTRAST TECHNIQUE: Multidetector CT imaging of the chest, abdomen and pelvis was performed following the standard protocol during bolus administration of intravenous contrast. CONTRAST:  134m OMNIPAQUE IOHEXOL 300 MG/ML  SOLN COMPARISON:  MRI from 10/11/2020, CT from 12/08/2010. FINDINGS: CT CHEST FINDINGS Cardiovascular: Thoracic aorta demonstrates atherosclerotic calcifications without aneurysmal dilatation or dissection. Irregular soft plaque is noted in the descending aorta. No cardiac enlargement is seen. Coronary calcifications are noted. The pulmonary artery as visualized is within normal  limits although some attenuation of the branches are seen in the region of the right hilum. Mediastinum/Nodes: Thoracic inlet is within normal limits. There is a precarinal nodal mass which measures 2.3 cm in short axis. Additionally hilar adenopathy is noted best seen on image number 31 of series 2 on the right. This causes compression upon the pulmonary arterial branches at this level. This nodal mass measures approximately 2.8 x 3.1 cm in greatest dimension. The esophagus as visualized is within normal limits. Stable sliding-type hiatal hernia is noted. Approximately 50% of the stomach remains in the chest. This is stable from the prior exam. Lungs/Pleura: The lungs are well aerated bilaterally. Emphysematous changes are seen. The left lung is clear with the exception of some basilar atelectasis. In the medial aspect of the left lower lobe best seen on image number 83 of series 4 there is a 12.7 mm nodule identified. No other sizable nodules are seen. Some scattered calcified granulomas are noted as well. Musculoskeletal: Degenerative changes of the thoracic spine are noted. No compression deformity is seen. Fractures involving the right sixth and left fourth rib are noted. Some moth-eaten appearance of  the ribs adjacent to the fractures are seen. A mild soft tissue component is noted associated with the left fourth rib fracture. These changes are consistent with pathologic fractures. CT ABDOMEN PELVIS FINDINGS Hepatobiliary: Scattered hypodensities are noted within the liver stable from the prior CT examination likely representing small cysts. The gallbladder is within normal limits. No biliary ductal dilatation is seen. Pancreas: Unremarkable. No pancreatic ductal dilatation or surrounding inflammatory changes. Spleen: Normal in size without focal abnormality. Adrenals/Urinary Tract: Nodular enlargement of the adrenal glands is noted new from the prior exam worst on the left with a focal 3.4 x 1.9 cm mass lesion identified. These changes are consistent with adrenal metastatic disease. The kidneys demonstrate cystic change on the left which is new from the prior CT of 2012 but simple in appearance. Nonobstructing renal calculi are noted bilaterally right greater than left. The largest of these is noted on the right measuring approximately 8.6 mm. No obstructive changes are seen. The bladder is well distended. Stomach/Bowel: Diverticular change of the colon is noted without evidence of diverticulitis. No obstructive or inflammatory changes are seen. No mass lesion is noted. The appendix is within normal limits. Small bowel shows no obstructive change. The stomach again demonstrates the previously described hiatal hernia. Vascular/Lymphatic: The abdominal aorta demonstrates atherosclerotic calcification and occlusion just below the renal arteries. These changes are consistent with the prior aortic repair. Bilateral axillo to femoral bypass grafts are noted. Previously seen surrounding seromas have resolved in the interval. The grafts are widely patent with normal outflow into the superficial and deep femoral arteries bilaterally. Reproductive: Prostate is unremarkable. Other: Mild laxity of the anterior abdominal  wall is noted likely related to prior surgical intervention. No true herniation is seen. No abdominopelvic ascites. Musculoskeletal: Mottled appearance to the L5 vertebral body is noted consistent with that seen on recent MRI. No other definitive metastatic foci are noted in the lumbar spine. IMPRESSION: Changes most consistent with a small pulmonary neoplasm with hilar and mediastinal adenopathy as well as bilateral adrenal metastatic disease. The previously seen disease in the L5 vertebral body is again noted also related to metastatic disease. There are bilateral rib fractures with permeative pattern and associated soft tissue prominence consistent with metastatic disease and pathologic fracture. Tissue sampling is recommended. PET-CT may also be helpful for further evaluation of metastatic disease.  Bilateral patent axillofemoral bypass grafts. Diverticulosis without diverticulitis. Bilateral nonobstructing renal calculi. These results will be called to the ordering clinician or representative by the Radiologist Assistant, and communication documented in the PACS or [employer]. Electronically Signed   By: Inez Catalina M.D.   On: 10/16/2020 17:32   DG C-Arm 1-60 Min  Result Date: 10/22/2020 CLINICAL DATA:  L5 laminectomy tumor resection. L5 metastasis with soft tissue encroachment into the spinal canal. EXAM: OPERATIVE LUMBAR SPINE L5 VIEW(S) COMPARISON:  None. FINDINGS: Single intraoperative view is submitted. Surgical probe is directed at the L5 spinous process. Soft tissue retractors are in place. IMPRESSION: Intraoperative localization of L5. Electronically Signed   By: San Morelle M.D.   On: 10/22/2020 12:55   VAS [place] ABI WITH/WO TBI  Result Date: 10/07/2020 LOWER EXTREMITY DOPPLER STUDY Indications: Peripheral artery disease. High Risk Factors: Hypertension, current smoker.  Vascular Interventions: Bilateral ax-fem bypass grafts following infected aortic                         graft on  July 22 2010. Performing Technologist: Delorise Shiner RVT  Examination Guidelines: A complete evaluation includes at minimum, Doppler waveform signals and systolic blood pressure reading at the level of bilateral brachial, anterior tibial, and posterior tibial arteries, when vessel segments are accessible. Bilateral testing is considered an integral part of a complete examination. Photoelectric Plethysmograph (PPG) waveforms and toe systolic pressure readings are included as required and additional duplex testing as needed. Limited examinations for reoccurring indications may be performed as noted.  ABI Findings: +---------+------------------+-----+---------+--------+ Right    Rt Pressure (mmHg)IndexWaveform Comment  +---------+------------------+-----+---------+--------+ Brachial 142                                      +---------+------------------+-----+---------+--------+ ATA      143               0.97                   +---------+------------------+-----+---------+--------+ PTA      127               0.86 triphasic         +---------+------------------+-----+---------+--------+ DP                              triphasic         +---------+------------------+-----+---------+--------+ Great Toe71                0.48                   +---------+------------------+-----+---------+--------+ +---------+------------------+-----+---------+-------+ Left     Lt Pressure (mmHg)IndexWaveform Comment +---------+------------------+-----+---------+-------+ Brachial 147                                     +---------+------------------+-----+---------+-------+ ATA      149               1.01                  +---------+------------------+-----+---------+-------+ PTA      152               1.03 triphasic        +---------+------------------+-----+---------+-------+ DP  biphasic          +---------+------------------+-----+---------+-------+ Doristine Devoid Toe111               0.76                  +---------+------------------+-----+---------+-------+ +-------+-----------+-----------+------------+------------+ ABI/TBIToday's ABIToday's TBIPrevious ABIPrevious TBI +-------+-----------+-----------+------------+------------+ Right  0.97       0.48       1.04        0.81         +-------+-----------+-----------+------------+------------+ Left   1.03       0.76       1.08        0.71         +-------+-----------+-----------+------------+------------+ Bilateral TBIs appear essentially unchanged compared to prior study on 01/01/2020.  Summary: Right: Resting right ankle-brachial index is within normal range. No evidence of significant right lower extremity arterial disease. The right toe-brachial index is abnormal. RT great toe pressure = 71 mmHg. Left: Resting left ankle-brachial index is within normal range. No evidence of significant left lower extremity arterial disease. The left toe-brachial index is normal. LT Great toe pressure = 111 mmHg.  *See table(s) above for measurements and observations.  Electronically signed by Harold Barban MD on 10/07/2020 at 3:27:43 PM.    Final        IMPRESSION/PLAN: 1. 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  The patient was understandably upset that he was not aware of this diagnosis being confirmed.  We reviewed the nature of lung cancer and specifically discussed the work-up and staging of cell carcinoma.  While he was willing to hear some of the information about staging process and those involved for treatment and care, he was not quite ready to move forward with meeting medical oncology as soon as he can and rather prefers to move forward with radiation and then consider being seen by medical oncology, scheduling staging PET and MRI scan of his brain once we get more deeply  into the process of radiotherapy.  We discussed the rationale for palliative radiotherapy to his L-spine and Dr. Lisbeth Renshaw recommends a course of 30 Gy in 10 fractions.  The patient is interested in proceeding and will come in next week on Wednesday for simulation.  We reviewed the risks, benefits, short and long-term effects of radiotherapy and the patient is interested in proceeding.Written consent is obtained and placed in the chart, a copy was provided to the patient.  He will also see Dr. Venetia Constable next week as well.  We expect to begin treatment the week of 11/11/2020 at the request of the patient but if he changes his mind and wants to be in sooner we could certainly move his treatment up accordingly.  In a visit lasting 60 minutes, greater than 50% of the time was spent face to face discussing the patient's condition, in preparation for the discussion, and coordinating the patient's care.   The above documentation reflects my direct findings during this shared patient visit. Please see the separate note by Dr. Lisbeth Renshaw on this date for the remainder of the patient's plan of care.    Carola Rhine, PAC

## 2020-11-01 NOTE — Telephone Encounter (Signed)
Gregory Hodges has been scheduled to see [name] on 2/15 at 130pm w/labs at 1pm. Letter mailed.

## 2020-11-01 NOTE — Telephone Encounter (Signed)
I received a call from Mr. Parkerson.  He was upset and states he did not know that he had metastatic lung cancer.  He was very upset.  I listened as he explained.  At the end of the conversation, he is willing to see Dr. Julien Nordmann when he is here for radiation.  I told him I will look for his schedule and call him back.

## 2020-11-01 NOTE — Progress Notes (Signed)
I received referral message on Gregory Hodges.  I updated new patient coordinator to call and schedule him to be seen on 2/8 with Dr. Julien Nordmann.

## 2020-11-06 ENCOUNTER — Other Ambulatory Visit: Payer: Self-pay

## 2020-11-06 ENCOUNTER — Ambulatory Visit
Admission: RE | Admit: 2020-11-06 | Discharge: 2020-11-06 | Disposition: A | Discharge status: Home / Self Care | Payer: PPO | Source: Ambulatory Visit | Attending: Radiation Oncology | Admitting: Radiation Oncology

## 2020-11-06 DIAGNOSIS — C3490 Malignant neoplasm of unspecified part of unspecified bronchus or lung: Secondary | ICD-10-CM | POA: Diagnosis not present

## 2020-11-06 DIAGNOSIS — C7951 Secondary malignant neoplasm of bone: Secondary | ICD-10-CM | POA: Diagnosis not present

## 2020-11-06 DIAGNOSIS — Z510 Encounter for antineoplastic radiation therapy: Secondary | ICD-10-CM | POA: Diagnosis not present

## 2020-11-07 ENCOUNTER — Telehealth: Payer: Self-pay

## 2020-11-07 NOTE — Telephone Encounter (Signed)
Pt called upset stating no one has told him why he needs to be seen, why he needs blood work or why he needs a PET scan. He then stated he previously discussed what he needed with Theressa Millard, PA-C but wants to revisit this conversation with her and/or Dr. Lisbeth Renshaw but was willing to still present for the appt with [name], PA-C and for the lab work. I advised the pt I would see if the appt time slots were still available and call him back.  I then spoke with Melissa in scheduling and was able to secure the pts original appt times on 11/12/20. I then called the pt back to advise of this and he now states he has decided he no longer wants to come. He wants to wait until he is completely done with radiation and wants a follow-up appt with Dr. Lisbeth Renshaw to discuss why he has been referred to Dr. Julien Nordmann, needs labs and a PET scan.  I advised the pt I will notify Dr. Lisbeth Renshaw and Bryson Ha, PA-C of his request and concerns.

## 2020-11-07 NOTE — Telephone Encounter (Signed)
Thanks. He is really upset by his diagnosis. Dr. Lisbeth Renshaw sees him tomorrow and I'll let him know what's going on too.

## 2020-11-08 DIAGNOSIS — C7951 Secondary malignant neoplasm of bone: Secondary | ICD-10-CM | POA: Diagnosis not present

## 2020-11-08 DIAGNOSIS — C3490 Malignant neoplasm of unspecified part of unspecified bronchus or lung: Secondary | ICD-10-CM | POA: Diagnosis not present

## 2020-11-08 DIAGNOSIS — Z510 Encounter for antineoplastic radiation therapy: Secondary | ICD-10-CM | POA: Diagnosis not present

## 2020-11-08 NOTE — Telephone Encounter (Signed)
Please cancel his appointment with [place] and will be happy to see him when he is ready.  Thank you.

## 2020-11-11 ENCOUNTER — Ambulatory Visit
Admission: RE | Admit: 2020-11-11 | Discharge: 2020-11-11 | Disposition: A | Discharge status: Home / Self Care | Payer: PPO | Source: Ambulatory Visit | Attending: Radiation Oncology | Admitting: Radiation Oncology

## 2020-11-11 ENCOUNTER — Other Ambulatory Visit: Payer: Self-pay

## 2020-11-11 DIAGNOSIS — C3490 Malignant neoplasm of unspecified part of unspecified bronchus or lung: Secondary | ICD-10-CM | POA: Diagnosis not present

## 2020-11-11 DIAGNOSIS — Z510 Encounter for antineoplastic radiation therapy: Secondary | ICD-10-CM | POA: Diagnosis not present

## 2020-11-11 DIAGNOSIS — C7951 Secondary malignant neoplasm of bone: Secondary | ICD-10-CM | POA: Diagnosis not present

## 2020-11-12 ENCOUNTER — Other Ambulatory Visit: Payer: PPO

## 2020-11-12 ENCOUNTER — Ambulatory Visit
Admission: RE | Admit: 2020-11-12 | Discharge: 2020-11-12 | Disposition: A | Discharge status: Home / Self Care | Payer: PPO | Source: Ambulatory Visit | Attending: Radiation Oncology | Admitting: Radiation Oncology

## 2020-11-12 ENCOUNTER — Ambulatory Visit: Payer: PPO | Admitting: Physician Assistant

## 2020-11-12 ENCOUNTER — Other Ambulatory Visit: Payer: Self-pay

## 2020-11-12 DIAGNOSIS — C7951 Secondary malignant neoplasm of bone: Secondary | ICD-10-CM | POA: Diagnosis not present

## 2020-11-12 DIAGNOSIS — Z510 Encounter for antineoplastic radiation therapy: Secondary | ICD-10-CM | POA: Diagnosis not present

## 2020-11-12 DIAGNOSIS — C3490 Malignant neoplasm of unspecified part of unspecified bronchus or lung: Secondary | ICD-10-CM | POA: Diagnosis not present

## 2020-11-13 ENCOUNTER — Other Ambulatory Visit: Payer: Self-pay

## 2020-11-13 ENCOUNTER — Ambulatory Visit
Admission: RE | Admit: 2020-11-13 | Discharge: 2020-11-13 | Disposition: A | Discharge status: Home / Self Care | Payer: PPO | Source: Ambulatory Visit | Attending: Radiation Oncology | Admitting: Radiation Oncology

## 2020-11-13 DIAGNOSIS — C7951 Secondary malignant neoplasm of bone: Secondary | ICD-10-CM | POA: Diagnosis not present

## 2020-11-13 DIAGNOSIS — Z510 Encounter for antineoplastic radiation therapy: Secondary | ICD-10-CM | POA: Diagnosis not present

## 2020-11-13 DIAGNOSIS — C3490 Malignant neoplasm of unspecified part of unspecified bronchus or lung: Secondary | ICD-10-CM | POA: Diagnosis not present

## 2020-11-14 ENCOUNTER — Ambulatory Visit
Admission: RE | Admit: 2020-11-14 | Discharge: 2020-11-14 | Disposition: A | Discharge status: Home / Self Care | Payer: PPO | Source: Ambulatory Visit | Attending: Radiation Oncology | Admitting: Radiation Oncology

## 2020-11-14 DIAGNOSIS — Z510 Encounter for antineoplastic radiation therapy: Secondary | ICD-10-CM | POA: Diagnosis not present

## 2020-11-14 DIAGNOSIS — C7951 Secondary malignant neoplasm of bone: Secondary | ICD-10-CM | POA: Diagnosis not present

## 2020-11-14 DIAGNOSIS — C3490 Malignant neoplasm of unspecified part of unspecified bronchus or lung: Secondary | ICD-10-CM | POA: Diagnosis not present

## 2020-11-15 ENCOUNTER — Ambulatory Visit
Admission: RE | Admit: 2020-11-15 | Discharge: 2020-11-15 | Disposition: A | Discharge status: Home / Self Care | Payer: PPO | Source: Ambulatory Visit | Attending: Radiation Oncology | Admitting: Radiation Oncology

## 2020-11-15 DIAGNOSIS — Z510 Encounter for antineoplastic radiation therapy: Secondary | ICD-10-CM | POA: Diagnosis not present

## 2020-11-15 DIAGNOSIS — C3490 Malignant neoplasm of unspecified part of unspecified bronchus or lung: Secondary | ICD-10-CM | POA: Diagnosis not present

## 2020-11-15 DIAGNOSIS — C7951 Secondary malignant neoplasm of bone: Secondary | ICD-10-CM | POA: Diagnosis not present

## 2020-11-18 ENCOUNTER — Ambulatory Visit
Admission: RE | Admit: 2020-11-18 | Discharge: 2020-11-18 | Disposition: A | Discharge status: Home / Self Care | Payer: PPO | Source: Ambulatory Visit | Attending: Radiation Oncology | Admitting: Radiation Oncology

## 2020-11-18 ENCOUNTER — Other Ambulatory Visit: Payer: Self-pay

## 2020-11-18 DIAGNOSIS — Z510 Encounter for antineoplastic radiation therapy: Secondary | ICD-10-CM | POA: Diagnosis not present

## 2020-11-18 DIAGNOSIS — C7951 Secondary malignant neoplasm of bone: Secondary | ICD-10-CM | POA: Diagnosis not present

## 2020-11-18 DIAGNOSIS — C3490 Malignant neoplasm of unspecified part of unspecified bronchus or lung: Secondary | ICD-10-CM | POA: Diagnosis not present

## 2020-11-19 ENCOUNTER — Ambulatory Visit
Admission: RE | Admit: 2020-11-19 | Discharge: 2020-11-19 | Disposition: A | Discharge status: Home / Self Care | Payer: PPO | Source: Ambulatory Visit | Attending: Radiation Oncology | Admitting: Radiation Oncology

## 2020-11-19 ENCOUNTER — Other Ambulatory Visit: Payer: Self-pay

## 2020-11-19 DIAGNOSIS — C3490 Malignant neoplasm of unspecified part of unspecified bronchus or lung: Secondary | ICD-10-CM | POA: Diagnosis not present

## 2020-11-19 DIAGNOSIS — Z510 Encounter for antineoplastic radiation therapy: Secondary | ICD-10-CM | POA: Diagnosis not present

## 2020-11-19 DIAGNOSIS — C7951 Secondary malignant neoplasm of bone: Secondary | ICD-10-CM | POA: Diagnosis not present

## 2020-11-20 ENCOUNTER — Ambulatory Visit
Admission: RE | Admit: 2020-11-20 | Discharge: 2020-11-20 | Disposition: A | Discharge status: Home / Self Care | Payer: PPO | Source: Ambulatory Visit | Attending: Radiation Oncology | Admitting: Radiation Oncology

## 2020-11-20 ENCOUNTER — Other Ambulatory Visit: Payer: Self-pay

## 2020-11-20 DIAGNOSIS — C3490 Malignant neoplasm of unspecified part of unspecified bronchus or lung: Secondary | ICD-10-CM | POA: Diagnosis not present

## 2020-11-20 DIAGNOSIS — Z510 Encounter for antineoplastic radiation therapy: Secondary | ICD-10-CM | POA: Diagnosis not present

## 2020-11-20 DIAGNOSIS — C7951 Secondary malignant neoplasm of bone: Secondary | ICD-10-CM | POA: Diagnosis not present

## 2020-11-21 ENCOUNTER — Ambulatory Visit
Admission: RE | Admit: 2020-11-21 | Discharge: 2020-11-21 | Disposition: A | Discharge status: Home / Self Care | Payer: PPO | Source: Ambulatory Visit | Attending: Radiation Oncology | Admitting: Radiation Oncology

## 2020-11-21 DIAGNOSIS — Z510 Encounter for antineoplastic radiation therapy: Secondary | ICD-10-CM | POA: Diagnosis not present

## 2020-11-21 DIAGNOSIS — C3490 Malignant neoplasm of unspecified part of unspecified bronchus or lung: Secondary | ICD-10-CM | POA: Diagnosis not present

## 2020-11-21 DIAGNOSIS — C7951 Secondary malignant neoplasm of bone: Secondary | ICD-10-CM | POA: Diagnosis not present

## 2020-11-22 ENCOUNTER — Other Ambulatory Visit: Payer: Self-pay

## 2020-11-22 ENCOUNTER — Telehealth: Payer: Self-pay | Admitting: *Deleted

## 2020-11-22 ENCOUNTER — Encounter: Payer: Self-pay | Admitting: Radiation Oncology

## 2020-11-22 ENCOUNTER — Ambulatory Visit
Admission: RE | Admit: 2020-11-22 | Discharge: 2020-11-22 | Disposition: A | Discharge status: Home / Self Care | Payer: PPO | Source: Ambulatory Visit | Attending: Radiation Oncology | Admitting: Radiation Oncology

## 2020-11-22 DIAGNOSIS — Z510 Encounter for antineoplastic radiation therapy: Secondary | ICD-10-CM | POA: Diagnosis not present

## 2020-11-22 DIAGNOSIS — R918 Other nonspecific abnormal finding of lung field: Secondary | ICD-10-CM

## 2020-11-22 DIAGNOSIS — C3490 Malignant neoplasm of unspecified part of unspecified bronchus or lung: Secondary | ICD-10-CM | POA: Diagnosis not present

## 2020-11-22 DIAGNOSIS — C7951 Secondary malignant neoplasm of bone: Secondary | ICD-10-CM | POA: Diagnosis not present

## 2020-11-22 NOTE — Telephone Encounter (Signed)
I received a call back from Gregory Hodges.  I gave him an appt to be seen on 3/1 but he declined the appt. I did get him scheduled for 3/8 with labs and Dr. Julien Nordmann. He verbalized understanding of appt

## 2020-11-22 NOTE — Telephone Encounter (Signed)
I received a re-referral today on Gregory Hodges.  I called to schedule him to be seen but was unable to reach. I did leave vm message with my name and phone number to call.

## 2020-11-25 NOTE — Progress Notes (Signed)
  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  Intent: Palliative  Radiation Treatment Dates: 11/11/2020 through 11/22/2020 Site Technique Total Dose (Gy) Dose per Fx (Gy) Completed Fx Beam Energies  Lumbar Spine: Spine_L5 3D 30/30 3 10/10 15X   Narrative: The patient tolerated radiation therapy relatively well. He was not interested in having MRI brain or PET scan during treatment, and desired to forgo the scheduling of these as well as to see medical oncology. He has had multiple discussions about this with Norton Blizzard and Dr. Lisbeth Renshaw. He is on the schedule now to meet with Dr. Julien Nordmann on 12/03/20 to discuss next steps.  Plan: The patient will receive a call in about one month from the radiation oncology department. He will continue follow up with Dr. Julien Nordmann as well.  ________________________________________________    Carola Rhine, [facility]

## 2020-12-03 ENCOUNTER — Inpatient Hospital Stay: Payer: PPO | Attending: Neurological Surgery | Admitting: Internal Medicine

## 2020-12-03 ENCOUNTER — Other Ambulatory Visit: Payer: Self-pay

## 2020-12-03 ENCOUNTER — Inpatient Hospital Stay: Payer: PPO

## 2020-12-03 DIAGNOSIS — C3490 Malignant neoplasm of unspecified part of unspecified bronchus or lung: Secondary | ICD-10-CM | POA: Diagnosis not present

## 2020-12-03 DIAGNOSIS — Z510 Encounter for antineoplastic radiation therapy: Secondary | ICD-10-CM | POA: Insufficient documentation

## 2020-12-03 DIAGNOSIS — Z79899 Other long term (current) drug therapy: Secondary | ICD-10-CM | POA: Insufficient documentation

## 2020-12-03 DIAGNOSIS — R918 Other nonspecific abnormal finding of lung field: Secondary | ICD-10-CM

## 2020-12-03 DIAGNOSIS — C7972 Secondary malignant neoplasm of left adrenal gland: Secondary | ICD-10-CM | POA: Insufficient documentation

## 2020-12-03 DIAGNOSIS — C3491 Malignant neoplasm of unspecified part of right bronchus or lung: Secondary | ICD-10-CM | POA: Insufficient documentation

## 2020-12-03 DIAGNOSIS — C7971 Secondary malignant neoplasm of right adrenal gland: Secondary | ICD-10-CM | POA: Insufficient documentation

## 2020-12-03 DIAGNOSIS — Z5112 Encounter for antineoplastic immunotherapy: Secondary | ICD-10-CM

## 2020-12-03 DIAGNOSIS — I10 Essential (primary) hypertension: Secondary | ICD-10-CM | POA: Diagnosis not present

## 2020-12-03 DIAGNOSIS — Z7982 Long term (current) use of aspirin: Secondary | ICD-10-CM | POA: Diagnosis not present

## 2020-12-03 DIAGNOSIS — F17210 Nicotine dependence, cigarettes, uncomplicated: Secondary | ICD-10-CM | POA: Diagnosis not present

## 2020-12-03 DIAGNOSIS — C7951 Secondary malignant neoplasm of bone: Secondary | ICD-10-CM | POA: Insufficient documentation

## 2020-12-03 DIAGNOSIS — Z5111 Encounter for antineoplastic chemotherapy: Secondary | ICD-10-CM

## 2020-12-03 LAB — CBC WITH DIFFERENTIAL (CANCER CENTER ONLY)
Abs Immature Granulocytes: 0.02 10*3/uL (ref 0.00–0.07)
Basophils Absolute: 0 10*3/uL (ref 0.0–0.1)
Basophils Relative: 1 %
Eosinophils Absolute: 0.1 10*3/uL (ref 0.0–0.5)
Eosinophils Relative: 3 %
HCT: 47.4 % (ref 39.0–52.0)
Hemoglobin: 14.8 g/dL (ref 13.0–17.0)
Immature Granulocytes: 1 %
Lymphocytes Relative: 6 %
Lymphs Abs: 0.2 10*3/uL — ABNORMAL LOW (ref 0.7–4.0)
MCH: 30.2 pg (ref 26.0–34.0)
MCHC: 31.2 g/dL (ref 30.0–36.0)
MCV: 96.7 fL (ref 80.0–100.0)
Monocytes Absolute: 0.6 10*3/uL (ref 0.1–1.0)
Monocytes Relative: 15 %
Neutro Abs: 3 10*3/uL (ref 1.7–7.7)
Neutrophils Relative %: 74 %
Platelet Count: 117 10*3/uL — ABNORMAL LOW (ref 150–400)
RBC: 4.9 MIL/uL (ref 4.22–5.81)
RDW: 13.9 % (ref 11.5–15.5)
WBC Count: 4 10*3/uL (ref 4.0–10.5)
nRBC: 0 % (ref 0.0–0.2)

## 2020-12-03 LAB — CMP (CANCER CENTER ONLY)
ALT: 6 U/L (ref 0–44)
AST: 10 U/L — ABNORMAL LOW (ref 15–41)
Albumin: 3.4 g/dL — ABNORMAL LOW (ref 3.5–5.0)
Alkaline Phosphatase: 79 U/L (ref 38–126)
Anion gap: 8 (ref 5–15)
BUN: 14 mg/dL (ref 8–23)
CO2: 26 mmol/L (ref 22–32)
Calcium: 8.5 mg/dL — ABNORMAL LOW (ref 8.9–10.3)
Chloride: 106 mmol/L (ref 98–111)
Creatinine: 1.05 mg/dL (ref 0.61–1.24)
GFR, Estimated: >60 mL/min (ref >60)
Glucose, Bld: 89 mg/dL (ref 70–99)
Potassium: 4.5 mmol/L (ref 3.5–5.1)
Sodium: 140 mmol/L (ref 135–145)
Total Bilirubin: 0.3 mg/dL (ref 0.3–1.2)
Total Protein: 6.2 g/dL — ABNORMAL LOW (ref 6.5–8.1)

## 2020-12-03 NOTE — Progress Notes (Signed)
[place] [phone]   [phone]  CONSULT NOTE  REFERRING PHYSICIAN: Dr. Kyung Rudd  REASON FOR CONSULTATION:  80 years old white male recently diagnosed with lung cancer.  HPI Gregory Hodges is a 80 y.o. 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 coli bacteremia and currently on chronic treatment with Bactrim and long history of smoking.  The patient was complaining of weakness and numbness in his upper extremities.  He was seen initially by his primary care physician who referred him to Dr. Trula Slade, his vascular surgeon.  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  During his evaluation he had CT scan of the chest, abdomen pelvis on October 16, 2020 and it showed a precarinal nodal mass measuring 2.3 cm in short axis.  There was additional hilar adenopathy on the right causes compression upon the pulmonary arterial branches at this level.  The nodal mass measured approximately 2.8 x 3.1 cm in greatest dimension.  The scan also showed nodular enlargement of the adrenal glands that is new from the prior exam worst on the left with a focal 3.4 x 1.9 cm mass lesion identified.  This the changes are consistent with adrenal metastatic disease.  The scan also showed mottled appearance of the L5 vertebral body most consistent with that seen on recent MRI.  On October 22, 2020 the patient underwent L5 laminectomy with resection of the epidural tumor under the care of Dr. Zada Finders.  The final pathology  [id number]) was consistent with metastatic small cell carcinoma. Immunohistochemical stains show that the tumor cells are positive for cytokeratin AE1/AE3 (cytoplasmic, dot-like), synaptophysin and vimentin whereas they are negative for chromogranin desmin, NSE, WT1 and CD99. This immunoprofile is consistent with above interpretation. The patient was referred to Dr. Lisbeth Renshaw and he underwent palliative radiotherapy to the L5 resected site. He was referred to me today for evaluation and recommendation regarding treatment of his condition.  When seen today the patient is feeling fine with no concerning complaints.  His back pain has significantly improved.  He denied having any current chest pain, shortness of breath, cough or hemoptysis.  He denied having any nausea, vomiting, diarrhea or constipation.  He has no weight loss or night sweats.  He denied having any headache or visual changes. Family history significant for father with history of colon and lung cancer.  Mother had osteoporosis and sister had multiple myeloma. The patient is married and has 2 daughters.  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  He was accompanied today by his wife Gregory Hodges.  The patient has a history of smoking 1 pack/day for around 60 years and unfortunately he continues to smoke.  He drinks alcohol occasionally and no history of drug abuse.  HPI  Past Medical History:  Diagnosis Date  . AAA (abdominal aortic aneurysm) (HCC)    s/p open repair using aortobifemoral graft 03/03/10 ([facility]), complicated by wound dehisence, enterocutaneous fistula; developed aortic graft infection s/p explant of graft and placement of bilateral axillofemoral grafts 07/22/10 [facility])  . Cancer (HCC)    Skin  .  Crohn's disease ([place])   . E coli bacteremia   . History of kidney stones   . Hyperlipemia   . Hypertension    "denies"  . Myocardial infarction ([place]) 08/11/2005   s/p Horizon study stent D1  . Peripheral vascular disease  [facility]) June 2011  . 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 ABDOMINAL AORTIC ANEURYSM REPAIR  03/03/2010  . AXILLARY-FEMORAL BYPASS GRAFT  07/22/2010   bilateral  . bowel     herniated bowel  . CARDIAC CATHETERIZATION    . CORONARY STENT PLACEMENT    . CYSTOSCOPY    . LAMINECTOMY N/A 10/22/2020   Procedure: Lumbar five Open Laminectomy for tumor resection;  Surgeon: Judith Part, MD;  Location: [facility];  Service: Neurosurgery;  Laterality: N/A;  . MOHS SURGERY     several     No family history on file.  Social History Social History   Tobacco Use  . Smoking status: Current Every Day Smoker    Packs/day: 1.00    Years: 30.00    Pack years: 30.00    Types: Cigarettes  . Smokeless tobacco: Never Used  Vaping Use  . Vaping Use: Never used  Substance Use Topics  . Alcohol use: Not Currently    Comment: special ocassional  . Drug use: No    Allergies  Allergen Reactions  . Oxycodone Nausea And Vomiting    Pt states he can not take any narcotic pain pills  . Codeine Nausea And Vomiting    All Narcotics  . Lisinopril     Throat swell up    Current Outpatient Medications  Medication Sig Dispense Refill  . acetaminophen (TYLENOL) 500 MG tablet Take 500 mg by mouth every 6 (six) hours as needed for moderate pain.    Marland Kitchen aspirin 81 MG tablet Take 81 mg by mouth daily.    Marland Kitchen atenolol (TENORMIN) 25 MG tablet Take 25 mg by mouth 2 (two) times daily.    . calcium carbonate (TUMS - DOSED IN MG ELEMENTAL CALCIUM) 500 MG chewable tablet Chew 2 tablets by mouth daily as needed for indigestion or heartburn.    . diphenhydrAMINE (BENADRYL) 25 MG tablet Take 50 mg by mouth daily as needed for allergies.    . ferrous sulfate 325 (65 FE) MG tablet Take 325 mg by mouth every Monday, Wednesday, and Friday.    . nitroGLYCERIN (NITROSTAT) 0.4 MG SL tablet Place 0.4 mg under the tongue every 5 (five) minutes as needed for chest  pain.    Marland Kitchen omeprazole (PRILOSEC) 10 MG capsule Take 10 mg by mouth daily.    Marland Kitchen oxymetazoline (AFRIN) 0.05 % nasal spray Place 1 spray into both nostrils 2 (two) times daily as needed for congestion.    . rosuvastatin (CRESTOR) 10 MG tablet Take 10 mg by mouth daily.    Marland Kitchen sulfamethoxazole-trimethoprim (BACTRIM DS) 800-160 MG per tablet Take 1 tablet by mouth 2 (two) times daily. 800-160 mg 60 tablet 11   No current facility-administered medications for this visit.    Review of Systems  Constitutional: positive for fatigue Eyes: negative Ears, nose, mouth, throat, and face: negative Respiratory: negative Cardiovascular: negative Gastrointestinal: negative Genitourinary:negative Integument/breast: negative Hematologic/lymphatic: negative Musculoskeletal:negative Neurological: negative Behavioral/Psych: negative Endocrine: negative Allergic/Immunologic: negative  Physical Exam  WIO:MBTDH, healthy, no distress, well nourished and well developed SKIN: skin color, texture, turgor are normal, no rashes or significant lesions HEAD: Normocephalic, No masses, lesions, tenderness  or abnormalities EYES: normal, PERRLA, Conjunctiva are pink and non-injected EARS: External ears normal, Canals clear OROPHARYNX:no exudate, no erythema and lips, buccal mucosa, and tongue normal  NECK: supple, no adenopathy, no JVD LYMPH:  no palpable lymphadenopathy, no hepatosplenomegaly LUNGS: clear to auscultation , and palpation HEART: regular rate & rhythm, no murmurs and no gallops ABDOMEN:abdomen soft, non-tender and normal bowel sounds BACK: Back symmetric, no curvature., No CVA tenderness EXTREMITIES:no joint deformities, effusion, or inflammation, no edema  NEURO: alert & oriented x 3 with fluent speech, no focal motor/sensory deficits  PERFORMANCE STATUS: ECOG 1  LABORATORY DATA: Lab Results  Component Value Date   WBC 4.0 12/03/2020   HGB 14.8 12/03/2020   HCT 47.4 12/03/2020   MCV 96.7  12/03/2020   PLT 117 (L) 12/03/2020      Chemistry      Component Value Date/Time   NA 140 10/18/2020 1046   K 5.4 (H) 10/18/2020 1046   CL 102 10/18/2020 1046   CO2 28 10/18/2020 1046   BUN 39 (H) 10/18/2020 1046   CREATININE 1.51 (H) 10/18/2020 1046      Component Value Date/Time   CALCIUM 9.2 10/18/2020 1046   ALKPHOS 56 10/18/2020 1046   AST 13 (L) 10/18/2020 1046   ALT 10 10/18/2020 1046   BILITOT 0.6 10/18/2020 1046       RADIOGRAPHIC STUDIES: No results found.  ASSESSMENT: This is a very pleasant 80 years old white male recently diagnosed with extensive stage (T2 a, N2, M1 C) small cell lung cancer presented with right hilar mass in addition to mediastinal lymphadenopathy as well as metastatic disease to the bone with complete replacement of the L5 status post laminectomy with resection of epidural tumor as well as metastatic disease to the adrenal gland diagnosed in January 2022.  The patient is status post palliative radiotherapy to the resected area at L5 under the care of Dr. Lisbeth Renshaw.   PLAN: I had a lengthy discussion with the patient and his wife today about his current disease stage, prognosis and treatment options. I personally and independently reviewed his scan images and discussed the result with the patient and his wife. I recommended for the patient to complete the staging work-up by ordering a PET scan as well as MRI of the brain to rule out any other metastatic disease. I discussed with the patient his treatment options and he understand that he has incurable condition and all the treatment options will be of palliative nature.  I discussed the prognosis with and without treatment with the patient and his wife and definitely they were upset with the statistic for the survival with small cell lung cancer I gave the patient the option of palliative care versus palliative systemic chemotherapy with carboplatin for AUC of 5 from day 1 and etoposide 100 mg/M2 on days  1, 2 and 3 with Cosela on the days of the chemotherapy.  I would also consider the patient for treatment with additional immunotherapy with durvalumab 1500 mg IV on day one of the chemotherapy every 3 weeks during the chemotherapy course followed by maintenance treatment but the only concern is flare of his Crohn's disease.  The patient mentioned that he has one episode of Crohn's more than 15 years ago and he never had any issues since that time.  I may consider The patient for the treatment with immunotherapy but will continue to monitor him closely for any flare of his Crohn's disease. I discussed with the patient the adverse  effect of the chemotherapy including but not limited to alopecia, myelosuppression, nausea and vomiting, peripheral neuropathy, liver or renal dysfunction as well as immunotherapy adverse effects. The patient would like to have some time to think about this option and during this time will complete the staging work-up. I will see him back for follow-up visit in around 2 weeks for more detailed discussion of his treatment options based on the final staging work-up and his decision. I strongly advised the patient to quit smoking. The patient was advised to call immediately if he has any other concerning symptoms in the interval. The patient voices understanding of current disease status and treatment options and is in agreement with the current care plan.  All questions were answered. The patient knows to call the clinic with any problems, questions or concerns. We can certainly see the patient much sooner if necessary.  Thank you so much for allowing me to participate in the care of Gregory Hodges. I will continue to follow up the patient with you and assist in his care.  The total time spent in the appointment was 90 minutes.  Disclaimer: This note was dictated with voice recognition software. Similar sounding words can inadvertently be transcribed and may not be corrected upon  review.   Gregory Hodges December 03, 2020, 2:12 PM

## 2020-12-04 ENCOUNTER — Encounter: Payer: Self-pay | Admitting: *Deleted

## 2020-12-04 DIAGNOSIS — C3491 Malignant neoplasm of unspecified part of right bronchus or lung: Secondary | ICD-10-CM

## 2020-12-04 NOTE — Progress Notes (Signed)
I called central scheduling today to get Gregory Hodges PET and MRI brain scheduled. They gave me a date, time, and pre-procedure instructions. I called Gregory Hodges and updated him on appt time, date and pre-procedure instructions.  He verbalized understanding. He would like an email of appt. I clarified his email and will send. I will notify [name] to get scans authed.

## 2020-12-04 NOTE — Progress Notes (Signed)
I notified scheduling to call and schedule patient to be seen with [name] and Dr. Julien Nordmann on 3/23 after his PET and MRI brain scans to go over results.

## 2020-12-05 ENCOUNTER — Telehealth: Payer: Self-pay | Admitting: Hematology

## 2020-12-05 ENCOUNTER — Telehealth: Payer: Self-pay | Admitting: *Deleted

## 2020-12-05 ENCOUNTER — Telehealth: Payer: Self-pay | Admitting: Internal Medicine

## 2020-12-05 NOTE — Telephone Encounter (Signed)
Scheduled appointment per 3/10 los. Spoke to patient who is aware of appointment date and time and that appt is a phone visit.

## 2020-12-05 NOTE — Telephone Encounter (Signed)
I received a message to call Gregory Hodges due to him having questions. I called and I clarified his questions.

## 2020-12-10 ENCOUNTER — Encounter: Payer: Self-pay | Admitting: *Deleted

## 2020-12-10 NOTE — Progress Notes (Signed)
I followed up on Gregory Hodges schedule. I did not see his treatment plan scheduled.  I completed a scheduling message to get his appt for tx scheduled.

## 2020-12-17 ENCOUNTER — Ambulatory Visit (HOSPITAL_COMMUNITY)
Admission: RE | Admit: 2020-12-17 | Discharge: 2020-12-17 | Disposition: A | Discharge status: Home / Self Care | Payer: PPO | Source: Ambulatory Visit | Attending: Radiation Oncology | Admitting: Radiation Oncology

## 2020-12-17 ENCOUNTER — Ambulatory Visit (HOSPITAL_COMMUNITY): Payer: PPO

## 2020-12-17 ENCOUNTER — Other Ambulatory Visit: Payer: Self-pay

## 2020-12-17 ENCOUNTER — Inpatient Hospital Stay: Payer: PPO

## 2020-12-17 ENCOUNTER — Other Ambulatory Visit: Payer: Self-pay | Admitting: Radiation Therapy

## 2020-12-17 DIAGNOSIS — R918 Other nonspecific abnormal finding of lung field: Secondary | ICD-10-CM

## 2020-12-17 DIAGNOSIS — G9389 Other specified disorders of brain: Secondary | ICD-10-CM | POA: Diagnosis not present

## 2020-12-17 DIAGNOSIS — Z510 Encounter for antineoplastic radiation therapy: Secondary | ICD-10-CM | POA: Diagnosis not present

## 2020-12-17 DIAGNOSIS — C3490 Malignant neoplasm of unspecified part of unspecified bronchus or lung: Secondary | ICD-10-CM | POA: Diagnosis not present

## 2020-12-17 DIAGNOSIS — C7931 Secondary malignant neoplasm of brain: Secondary | ICD-10-CM | POA: Diagnosis not present

## 2020-12-17 LAB — CMP (CANCER CENTER ONLY)
ALT: <6 U/L (ref 0–44)
AST: 10 U/L — ABNORMAL LOW (ref 15–41)
Albumin: 3.3 g/dL — ABNORMAL LOW (ref 3.5–5.0)
Alkaline Phosphatase: 91 U/L (ref 38–126)
Anion gap: 13 (ref 5–15)
BUN: 27 mg/dL — ABNORMAL HIGH (ref 8–23)
CO2: 24 mmol/L (ref 22–32)
Calcium: 9.3 mg/dL (ref 8.9–10.3)
Chloride: 104 mmol/L (ref 98–111)
Creatinine: 1.21 mg/dL (ref 0.61–1.24)
GFR, Estimated: >60 mL/min (ref >60)
Glucose, Bld: 80 mg/dL (ref 70–99)
Potassium: 5.2 mmol/L — ABNORMAL HIGH (ref 3.5–5.1)
Sodium: 141 mmol/L (ref 135–145)
Total Bilirubin: 0.4 mg/dL (ref 0.3–1.2)
Total Protein: 6.8 g/dL (ref 6.5–8.1)

## 2020-12-17 LAB — CBC WITH DIFFERENTIAL (CANCER CENTER ONLY)
Abs Immature Granulocytes: 0.05 10*3/uL (ref 0.00–0.07)
Basophils Absolute: 0.1 10*3/uL (ref 0.0–0.1)
Basophils Relative: 1 %
Eosinophils Absolute: 0.3 10*3/uL (ref 0.0–0.5)
Eosinophils Relative: 6 %
HCT: 47.1 % (ref 39.0–52.0)
Hemoglobin: 15.3 g/dL (ref 13.0–17.0)
Immature Granulocytes: 1 %
Lymphocytes Relative: 5 %
Lymphs Abs: 0.3 10*3/uL — ABNORMAL LOW (ref 0.7–4.0)
MCH: 30.7 pg (ref 26.0–34.0)
MCHC: 32.5 g/dL (ref 30.0–36.0)
MCV: 94.6 fL (ref 80.0–100.0)
Monocytes Absolute: 0.7 10*3/uL (ref 0.1–1.0)
Monocytes Relative: 13 %
Neutro Abs: 4.1 10*3/uL (ref 1.7–7.7)
Neutrophils Relative %: 74 %
Platelet Count: 149 10*3/uL — ABNORMAL LOW (ref 150–400)
RBC: 4.98 MIL/uL (ref 4.22–5.81)
RDW: 14 % (ref 11.5–15.5)
WBC Count: 5.5 10*3/uL (ref 4.0–10.5)
nRBC: 0 % (ref 0.0–0.2)

## 2020-12-17 IMAGING — MR MR HEAD WO/W CM
16 series · 48 of 48 positions shown · IV contrast (gadavist)
Comparison: No prior brain imaging.

CLINICAL DATA: 79-year-old male with non-small cell lung cancer
diagnosed in [REDACTED]. Lumbar metastatic disease. Brain staging.

EXAM:
MRI HEAD WITHOUT AND WITH CONTRAST
TECHNIQUE: Multiplanar, multiecho pulse sequences of the brain and surrounding
structures were obtained without and with intravenous contrast.
CONTRAST:  8mL GADAVIST GADOBUTROL 1 MMOL/ML IV SOLN

[Series 5: DWI · axial · 3.0mm · 1.36mm/px · z∈[-14,+145]mm · 6 of 112 slices shown (1 of 2)]
[im 1/112]
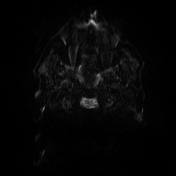
[im 23/112]
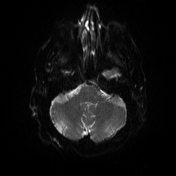
[im 45/112]
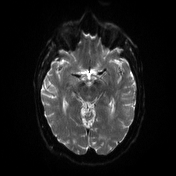
[im 67/112]
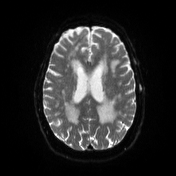
[im 89/112]
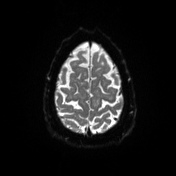
[im 112/112]
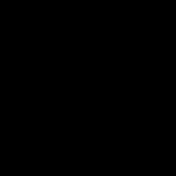

[Series 6: DWI · axial · 3.0mm · 1.36mm/px · z∈[-14,+143]mm · 3 of 55 slices shown (2 of 2)]
[im 1/55]
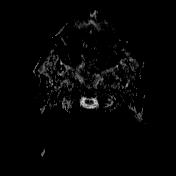
[im 28/55]
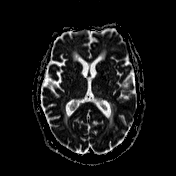
[im 55/55]
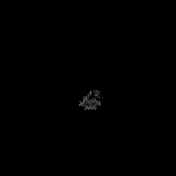

[Series 7: T1 · sagittal · 5.0mm · 0.75mm/px · 1 of 24 slices shown (1 of 4)]
[im 1/24]
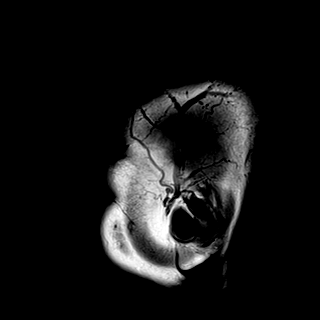

[Series 8: T2 · axial · 5.0mm · 0.65mm/px · 1 of 28 slices shown]
[im 1/28]
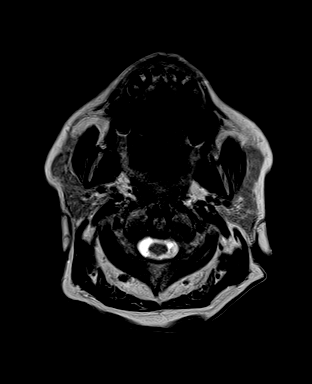

[Series 9: mip_images(sw) · axial · 24.0mm · 0.75mm/px · z∈[-10,+129]mm · 2 of 49 slices shown]
[im 1/49]
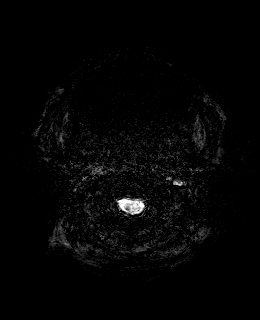
[im 49/49]
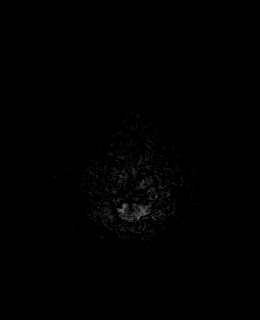

[Series 10: swi_images · axial · 3.0mm · 0.75mm/px · z∈[-20,+139]mm · 3 of 56 slices shown]
[im 1/56]
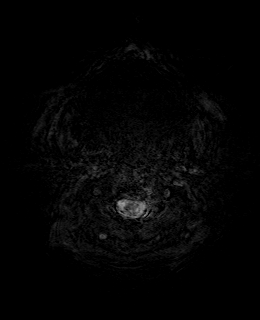
[im 28/56]
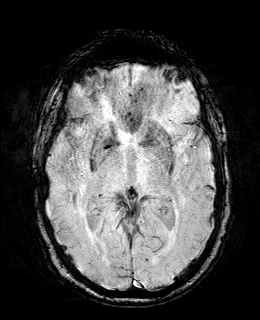
[im 56/56]
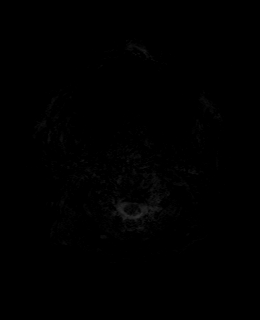

[Series 11: FLAIR · axial · 3.0mm · 0.75mm/px · z∈[-14,+133]mm · 3 of 52 slices shown]
[im 1/52]
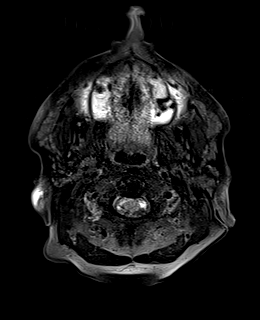
[im 26/52]
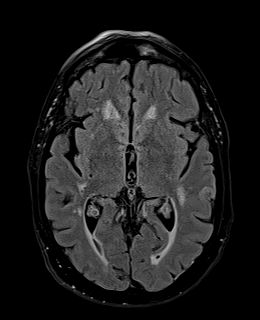
[im 52/52]
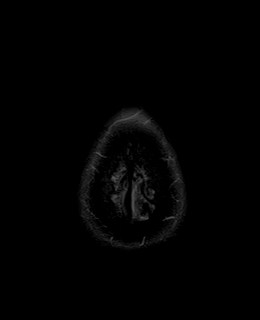

[Series 12: T1 · axial · 1.0mm · 0.94mm/px · z∈[-25,+143]mm · 9 of 176 slices shown (2 of 4)]
[im 1/176]
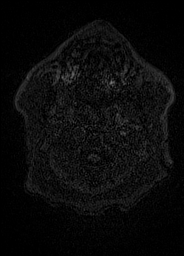
[im 22/176]
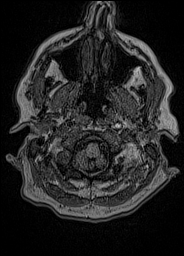
[im 44/176]
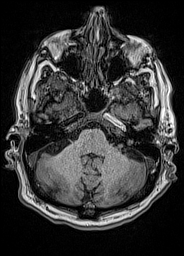
[im 66/176]
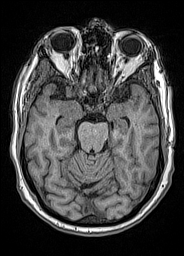
[im 88/176]
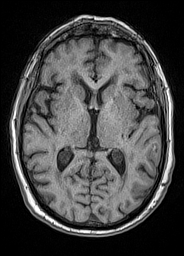
[im 110/176]
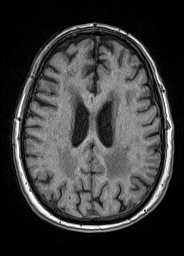
[im 132/176]
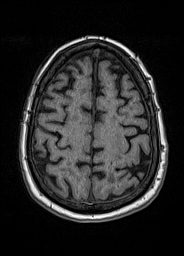
[im 154/176]
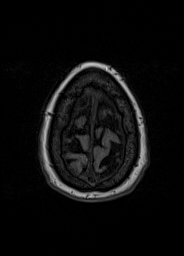
[im 176/176]
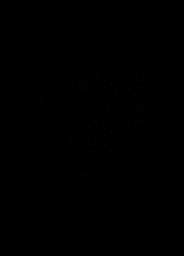

[Series 13: cor dwi_tracew · coronal · 5.0mm · 1.53mm/px · 3 of 52 slices shown]
[im 1/52]
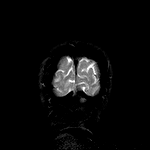
[im 26/52]
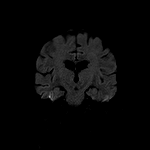
[im 52/52]
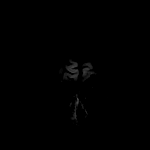

[Series 14: cor dwi_adc · coronal · 5.0mm · 1.53mm/px · 1 of 26 slices shown]
[im 1/26]
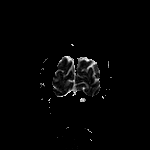

[Series 15: T2 post-contrast · coronal · 5.0mm · 0.57mm/px · 1 of 26 slices shown]
[im 1/26]
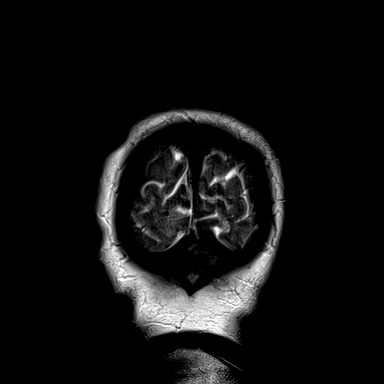

[Series 16: T1 post-contrast · axial · 1.0mm · 0.94mm/px · z∈[-25,+143]mm · 9 of 176 slices shown (1 of 3)]
[im 1/176]
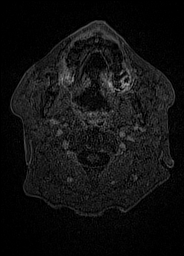
[im 22/176]
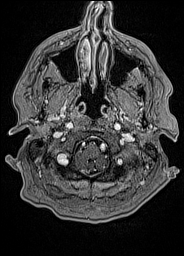
[im 44/176]
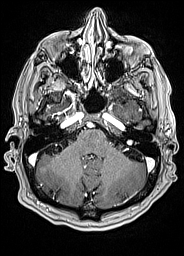
[im 66/176]
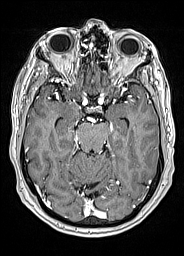
[im 88/176]
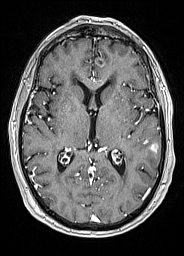
[im 110/176]
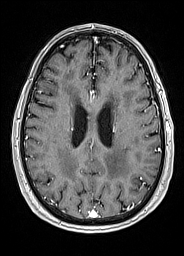
[im 132/176]
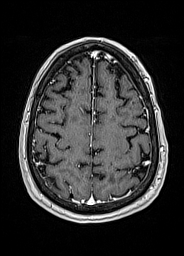
[im 154/176]
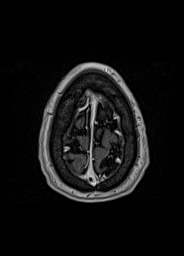
[im 176/176]
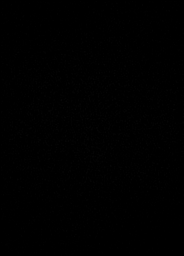

[Series 17: T1 · sagittal · 4.0mm · 0.94mm/px · 2 of 30 slices shown (3 of 4)]
[im 1/30]
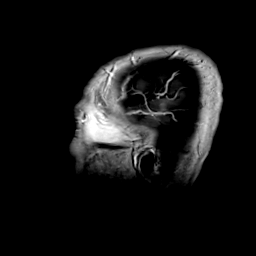
[im 30/30]
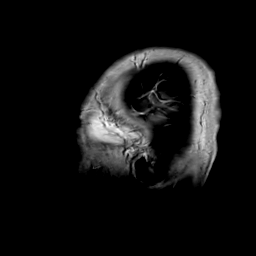

[Series 18: T1 · coronal · 4.0mm · 0.94mm/px · 2 of 30 slices shown (4 of 4)]
[im 1/30]
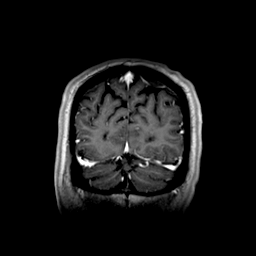
[im 30/30]
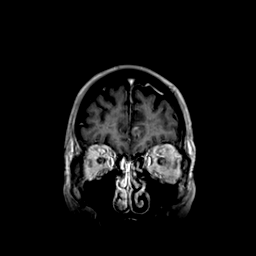

[Series 19: T1 post-contrast · coronal · 5.0mm · 0.43mm/px · 1 of 26 slices shown (2 of 3)]
[im 1/26]
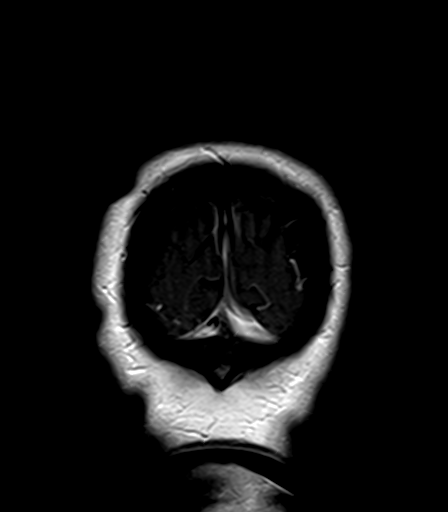

[Series 20: T1 post-contrast · sagittal · 5.0mm · 0.75mm/px · 1 of 24 slices shown (3 of 3)]
[im 1/24]
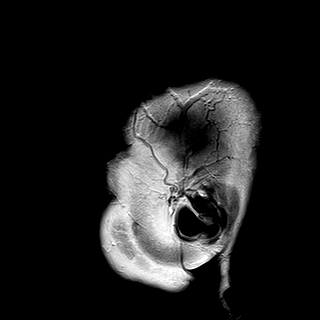

[48 of 48 positions shown; findings below may reference images not displayed]

FINDINGS: Brain: There are several enhancing brain lesions compatible with
metastatic disease, although several of these also demonstrate
lesser intrinsic T1 signal which is likely a component of hemorrhage
(series 12, image 31 the bilateral cerebellum which does have
susceptibility on SWI).

In total 6 enhancing lesions are identified ranging from 5-6 mm to
the largest which are 13 mm in the right inferior cerebellum (series
16, image 30) and left posterosuperior temporal lobe (image 84).
Three lesions are in the cerebellum. All lesions are annotated on
series 16 and 20. Virtually no associated vasogenic edema. Minimal
T2 and FLAIR hyperintensity at each lesion site. No intracranial
mass effect or midline shift.

No superimposed restricted diffusion to suggest acute infarction. No
ventriculomegaly, extra-axial collection or acute intracranial
hemorrhage. Cervicomedullary junction and pituitary are within
normal limits. Patchy and confluent bilateral white matter T2 and
FLAIR hyperintensity in both hemispheres is nonspecific, along with
similar patchy signal hyperintensity in the pons. No cortical
encephalomalacia identified. No dural thickening.

Vascular: Major intracranial vascular flow voids are preserved.
Generalized intracranial artery tortuosity. The major dural venous
sinuses are enhancing and appear to be patent.

Skull and upper cervical spine: C3-C4 disc degeneration with small
disc protrusion and degenerative spinal stenosis on series 7, image
13. Otherwise negative visible cervical spine and spinal cord.
Visible bone marrow signal is within normal limits.

Sinuses/Orbits: Negative orbits. Trace paranasal sinus mucosal
thickening.

Other: Mastoids are clear. Visible internal auditory structures
appear normal. Visible scalp and face appear negative.
IMPRESSION: 1. Positive for six brain metastases ranging from 6 mm to 13 mm.
Several show some internal hemorrhage, and 3 lesions are in the
cerebellum. But there is no significant brain edema or mass effect
at this time.

2. Advanced signal changes in the cerebral white matter and pons,
likely chronic small vessel disease.

3. Degenerative cervical spinal stenosis at C3-C4.

## 2020-12-17 MED ORDER — GADOBUTROL 1 MMOL/ML IV SOLN
8.0000 mL | Freq: Once | INTRAVENOUS | Status: AC | PRN
  Administered 2020-12-17: 8 mL via INTRAVENOUS

## 2020-12-18 ENCOUNTER — Telehealth: Payer: Self-pay | Admitting: Radiation Oncology

## 2020-12-18 NOTE — Telephone Encounter (Signed)
PET scanner is down for several days. I spoke with central scheduling and they are working on re-scheduling scans at a later time.

## 2020-12-18 NOTE — Telephone Encounter (Signed)
I called the patient this morning to review the results of his MRI of the brain.  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  The patient reports that he is not having any headaches blurred vision double vision difficulty with gait, movement, balance or tinnitus. I let him know that Dr. Lisbeth Renshaw would recommend a course of whole brain radiotherapy due to his MRI scan findings. Fortunately he clinically seems to be asymptomatic.  The patient's course has to date has included palliative radiotherapy to the lumbar spine, and he has met with Dr. Julien Nordmann in medical oncology.  A PET scan has been ordered.  Unfortunately there was a problem in getting his PET scan performed due to the machine going down and needing a replacement part.  Is now to my understanding going to be up and running again as of tomorrow; but his PET scan will need to be rescheduled.  He is also scheduled to meet tomorrow with Dr. Julien Nordmann to review PET and MRI scan results.  The patient verbalized to me several times during our conversation that he was confused as to why I would be calling him with the results of his MRI scan. I attempted to explain that there are multiple members of his treatment team and that I had ordered his MRI when we first met.  He described that he had no confidence in my providing recommendations for his care and prefers not to work with PAs.  I offered to make an appointment for him to meet Dr. Lisbeth Renshaw to discuss this further as well which he did not desire.  I apologized when he told me that we were not communicating effectively as a team but that I disagreed since we are all trying to work together to offer him care. At the conclusion of our call the patient was willing to come in tomorrow to meet with Dr. Julien Nordmann and discuss priorities of treatment of his brain,  lung and adrenal disease. He was also willing to coordinate a time for simulation tomorrow which will follow Dr. Worthy Flank appointment, but we did not discuss in detail the treatment side effects which we can discuss tomorrow prior to simulation.      Carola Rhine, PAC

## 2020-12-19 ENCOUNTER — Ambulatory Visit (HOSPITAL_COMMUNITY)
Admission: RE | Admit: 2020-12-19 | Discharge: 2020-12-19 | Disposition: A | Discharge status: Home / Self Care | Payer: PPO | Source: Ambulatory Visit | Attending: Internal Medicine | Admitting: Internal Medicine

## 2020-12-19 ENCOUNTER — Telehealth: Payer: Self-pay | Admitting: Medical Oncology

## 2020-12-19 ENCOUNTER — Other Ambulatory Visit: Payer: Self-pay

## 2020-12-19 ENCOUNTER — Encounter: Payer: Self-pay | Admitting: Internal Medicine

## 2020-12-19 ENCOUNTER — Ambulatory Visit: Admission: RE | Admit: 2020-12-19 | Payer: PPO | Source: Ambulatory Visit | Admitting: Radiation Oncology

## 2020-12-19 ENCOUNTER — Inpatient Hospital Stay: Payer: PPO | Admitting: Internal Medicine

## 2020-12-19 VITALS — BP 118/56 | HR 78 | Temp 97.7°F | Resp 15 | Ht 69.0 in | Wt 172.3 lb

## 2020-12-19 DIAGNOSIS — C3491 Malignant neoplasm of unspecified part of right bronchus or lung: Secondary | ICD-10-CM | POA: Diagnosis not present

## 2020-12-19 DIAGNOSIS — Z5112 Encounter for antineoplastic immunotherapy: Secondary | ICD-10-CM

## 2020-12-19 DIAGNOSIS — C7951 Secondary malignant neoplasm of bone: Secondary | ICD-10-CM | POA: Insufficient documentation

## 2020-12-19 DIAGNOSIS — J439 Emphysema, unspecified: Secondary | ICD-10-CM | POA: Insufficient documentation

## 2020-12-19 DIAGNOSIS — C7971 Secondary malignant neoplasm of right adrenal gland: Secondary | ICD-10-CM | POA: Diagnosis not present

## 2020-12-19 DIAGNOSIS — I700 Atherosclerosis of aorta: Secondary | ICD-10-CM | POA: Insufficient documentation

## 2020-12-19 DIAGNOSIS — C3490 Malignant neoplasm of unspecified part of unspecified bronchus or lung: Secondary | ICD-10-CM | POA: Insufficient documentation

## 2020-12-19 DIAGNOSIS — Z5111 Encounter for antineoplastic chemotherapy: Secondary | ICD-10-CM

## 2020-12-19 DIAGNOSIS — Z510 Encounter for antineoplastic radiation therapy: Secondary | ICD-10-CM | POA: Diagnosis not present

## 2020-12-19 LAB — GLUCOSE, CAPILLARY: Glucose-Capillary: 85 mg/dL (ref 70–99)

## 2020-12-19 IMAGING — PT NM PET TUM IMG INITIAL (PI) SKULL BASE T - THIGH
1 of 8 series · 1 of 25 positions shown · non-contrast
Comparison: [DATE]

CLINICAL DATA: Initial treatment strategy for small cell lung
cancer staging.

EXAM:
NUCLEAR MEDICINE PET SKULL BASE TO THIGH
TECHNIQUE: 8.6 mCi F-18 FDG was injected intravenously. Full-ring PET imaging
was performed from the skull base to thigh after the radiotracer. CT
data was obtained and used for attenuation correction and anatomic
localization.
Fasting blood glucose: 85 mg/dl

[Series 4: ct sk_thigh 5.0 bf37 · axial · 5.0mm · 0.98mm/px · 1 of 232 slices shown]
[im 232/232  brain]
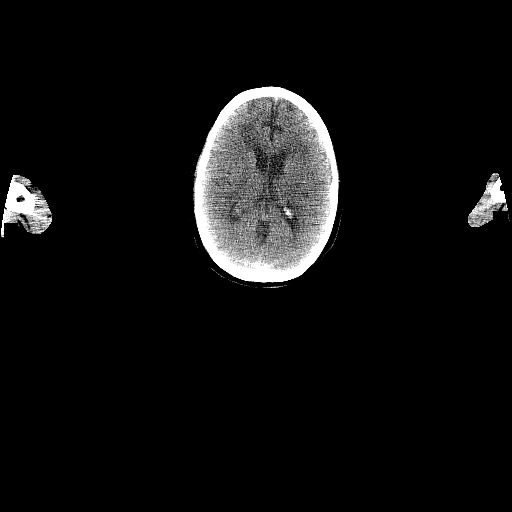

[1 of 25 positions shown; findings below may reference images not displayed]

FINDINGS: Mediastinal blood pool activity: SUV max

Liver activity: SUV max NA

NECK: No hypermetabolic lymph nodes in the neck.

Incidental CT findings: none

CHEST: RIGHT mediastinal and RIGHT hilar adenopathy, confluent and
hypermetabolic. Maximum SUV about the RIGHT paratracheal chain
(image 70, series 4) at the site of 2.1 cm RIGHT paratracheal lymph
node with other small nodes in this location, indistinguishable from
this node on the FDG portion of the exam.

Fullness of the RIGHT hilum with marked hypermetabolic activity on
image 77 of series 4 maximum SUV 14.8. Lymph nodes measuring up to 2
cm short axis, better defined on the recent CT of the chest from
[facility]. Soft tissue surrounds bronchial and vascular structures
difficult separate due to lack of contrast peer small RIGHT pleural
effusion with adjacent hypermetabolic changes in RIGHT posterior
ribs, see below.

Small RIGHT pulmonary nodule measuring 14 mm in approximate
dimension, limited measurement by respiratory motion (image 42,
series 8) previously 9 mm also with intense metabolic activity that
blends with hilar activity seen on today's evaluation.

Incidental CT findings: Aortic atherosclerosis. No aneurysmal
dilation. Three-vessel coronary artery disease. No pericardial
effusion with mild cardiac enlargement. Central pulmonary
vasculature is normal caliber. Signs of bilateral axillofemoral
bypass. Large hiatal hernia extends into the chest as on the recent
CT evaluation. Signs of pulmonary emphysema and interval development
of a small RIGHT-sided pleural effusion.

ABDOMEN/PELVIS: Bilateral hypermetabolic adrenal masses. LEFT
adrenal measuring 3.6 x 1.8 cm within 1-2 mm of previous measurement
with a maximum SUV of 14.4. RIGHT adrenal also hypermetabolic with a
maximum SUV of 10, lesion measuring approximately 3.2 x 1.7 cm.
Previously this area measured approximately 2.9 x 1.6 cm.

No retroperitoneal adenopathy but with signs of pelvic
lymphadenopathy in the bilateral external iliac chains.
Hypermetabolic lymph node on image 171 of series 4 in the LEFT
external iliac chain measuring 11 mm with a maximum SUV of 8.

Hypermetabolic lymph node on image 172 of series 4 with a maximum
SUV of 9.8 measuring 11 mm. Smaller lymph node just above this
measuring 8-9 mm with similar metabolic activity.

Incidental CT findings: Mildly lobular contour of the liver with
subtle low-density foci without corresponding FDG uptake, likely
small cysts. Gallbladder without pericholecystic stranding. Pancreas
with normal appearance. Spleen, kidneys, stomach, small and large
bowel without acute process. Large ventral abdominal wall hernia,
rectus diastasis approximately 10 cm. Sigmoid diverticular disease
and diverticular changes scattered about the remainder of the colon.
Normal appendix.

LEFT renal cysts. Nephrolithiasis in the bilateral kidneys similar
to recent imaging.

Vascular disease with resection of the infrarenal abdominal aorta
and axillobifemoral bypass grafting.

SKELETON: RIGHT humeral metastasis on image 28 of series 4 with a
maximum SUV of 8.8, no corresponding destruction but with subtle
added density in the medullary space of the RIGHT proximal humerus
at this level.

Rib destruction of the LEFT fourth rib. Soft tissue thickening on
image 70 of series 4 measuring approximately 2.3 cm greatest
thickness but with diffuse involvement of the rib extending
anteriorly along the LEFT anterolateral chest and a maximum SUV of
11.3.

RIGHT posterior seventh rib with pathologic fracture and diffuse,
similar involvement with a maximum SUV of 9.7 showing permeative
destructive changes in both of these areas

Diffuse acetabular, lower iliac and RIGHT pubic bone involvement on
are subtle and extensive FDG uptake, maximum SUV of approximately 11

Incidental CT findings: L5 with sclerosis and fracture which is age
indeterminate but present on the previous study showing little FDG
uptake, maximum SUV of 2.9. Spinal degenerative changes.
IMPRESSION: Small RIGHT lung lesion with extensive mediastinal and RIGHT hilar
adenopathy and associated bony in visceral metastatic disease in
this patient with small cell lung cancer.

Presumed pathologic fracture of the L4 vertebral body but with less
FDG uptake than other areas involving ribs, RIGHT proximal humerus
and RIGHT pelvis. Based on the diffuse activity of the RIGHT pelvis
this patient would be a risk for pathologic fracture in this area.

Pathologic fracture of RIGHT-sided ribs, now segmental involving
RIGHT posterior seventh rib, associated with small RIGHT-sided
effusion.

Enlarging RIGHT adrenal metastasis and potential enlargement of the
primary lesion in short interval.

Signs of vascular disease and coronary artery disease with
axillobifemoral bypass grafting.

These results will be called to the ordering clinician or
representative by the Radiologist Assistant, and communication
documented in the PACS or [REDACTED].

Aortic Atherosclerosis ([U6]-[U6]) and Emphysema ([U6]-[U6]).

## 2020-12-19 MED ORDER — LIDOCAINE-PRILOCAINE 2.5-2.5 % EX CREA: TOPICAL_CREAM | CUTANEOUS | 0 refills | Status: DC

## 2020-12-19 MED ORDER — PROCHLORPERAZINE MALEATE 10 MG PO TABS: 10.0000 mg | ORAL_TABLET | Freq: Four times a day (QID) | ORAL | 0 refills | Status: DC | PRN

## 2020-12-19 MED ORDER — FLUDEOXYGLUCOSE F - 18 (FDG) INJECTION
8.0000 | Freq: Once | INTRAVENOUS | Status: AC | PRN
  Administered 2020-12-19: 8.6 via INTRAVENOUS

## 2020-12-19 NOTE — Progress Notes (Signed)
START ON PATHWAY REGIMEN - Small Cell Lung     Cycles 1 through 4: A cycle is every 21 days:     Durvalumab      Carboplatin      Etoposide    Cycles 5 and beyond: A cycle is every 28 days:     Durvalumab   **Always confirm dose/schedule in your pharmacy ordering system**  Patient Characteristics: Newly Diagnosed, Preoperative or Nonsurgical Candidate (Clinical Staging), First Line, Extensive Stage Therapeutic Status: Newly Diagnosed, Preoperative or Nonsurgical Candidate (Clinical Staging) AJCC T Category: cT2b AJCC N Category: cN2 AJCC M Category: cM1c AJCC 8 Stage Grouping: IVB Stage Classification: Extensive  Intent of Therapy: Non-Curative / Palliative Intent, Discussed with Patient

## 2020-12-19 NOTE — Patient Instructions (Signed)
Steps to Quit Smoking Smoking tobacco is the leading cause of preventable death. It can affect almost every organ in the body. Smoking puts you and people around you at risk for many serious, long-lasting (chronic) diseases. Quitting smoking can be hard, but it is one of the best things that you can do for your health. It is never too late to quit. How do I get ready to quit? When you decide to quit smoking, make a plan to help you succeed. Before you quit:  Pick a date to quit. Set a date within the next 2 weeks to give you time to prepare.  Write down the reasons why you are quitting. Keep this list in places where you will see it often.  Tell your family, friends, and co-workers that you are quitting. Their support is important.  Talk with your doctor about the choices that may help you quit.  Find out if your health insurance will pay for these treatments.  Know the people, places, things, and activities that make you want to smoke (triggers). Avoid them. What first steps can I take to quit smoking?  Throw away all cigarettes at home, at work, and in your car.  Throw away the things that you use when you smoke, such as ashtrays and lighters.  Clean your car. Make sure to empty the ashtray.  Clean your home, including curtains and carpets. What can I do to help me quit smoking? Talk with your doctor about taking medicines and seeing a counselor at the same time. You are more likely to succeed when you do both.  If you are pregnant or breastfeeding, talk with your doctor about counseling or other ways to quit smoking. Do not take medicine to help you quit smoking unless your doctor tells you to do so. To quit smoking: Quit right away  Quit smoking totally, instead of slowly cutting back on how much you smoke over a period of time.  Go to counseling. You are more likely to quit if you go to counseling sessions regularly. Take medicine You may take medicines to help you quit. Some  medicines need a prescription, and some you can buy over-the-counter. Some medicines may contain a drug called nicotine to replace the nicotine in cigarettes. Medicines may:  Help you to stop having the desire to smoke (cravings).  Help to stop the problems that come when you stop smoking (withdrawal symptoms). Your doctor may ask you to use:  Nicotine patches, gum, or lozenges.  Nicotine inhalers or sprays.  Non-nicotine medicine that is taken by mouth. Find resources Find resources and other ways to help you quit smoking and remain smoke-free after you quit. These resources are most helpful when you use them often. They include:  Online chats with a Social worker.  Phone quitlines.  Printed Furniture conservator/restorer.  Support groups or group counseling.  Text messaging programs.  Mobile phone apps. Use apps on your mobile phone or tablet that can help you stick to your quit plan. There are many free apps for mobile phones and tablets as well as websites. Examples include Quit Guide from the [employer] and smokefree.gov   What things can I do to make it easier to quit?  Talk to your family and friends. Ask them to support and encourage you.  Call a phone quitline (1-800-QUIT-NOW), reach out to support groups, or work with a Social worker.  Ask people who smoke to not smoke around you.  Avoid places that make you want to smoke,  such as: ? Bars. ? Parties. ? Smoke-break areas at work.  Spend time with people who do not smoke.  Lower the stress in your life. Stress can make you want to smoke. Try these things to help your stress: ? Getting regular exercise. ? Doing deep-breathing exercises. ? Doing yoga. ? Meditating. ? Doing a body scan. To do this, close your eyes, focus on one area of your body at a time from head to toe. Notice which parts of your body are tense. Try to relax the muscles in those areas.   How will I feel when I quit smoking? Day 1 to 3 weeks Within the first 24 hours,  you may start to have some problems that come from quitting tobacco. These problems are very bad 2-3 days after you quit, but they do not often last for more than 2-3 weeks. You may get these symptoms:  Mood swings.  Feeling restless, nervous, angry, or annoyed.  Trouble concentrating.  Dizziness.  Strong desire for high-sugar foods and nicotine.  Weight gain.  Trouble pooping (constipation).  Feeling like you may vomit (nausea).  Coughing or a sore throat.  Changes in how the medicines that you take for other issues work in your body.  Depression.  Trouble sleeping (insomnia). Week 3 and afterward After the first 2-3 weeks of quitting, you may start to notice more positive results, such as:  Better sense of smell and taste.  Less coughing and sore throat.  Slower heart rate.  Lower blood pressure.  Clearer skin.  Better breathing.  Fewer sick days. Quitting smoking can be hard. Do not give up if you fail the first time. Some people need to try a few times before they succeed. Do your best to stick to your quit plan, and talk with your doctor if you have any questions or concerns. Summary  Smoking tobacco is the leading cause of preventable death. Quitting smoking can be hard, but it is one of the best things that you can do for your health.  When you decide to quit smoking, make a plan to help you succeed.  Quit smoking right away, not slowly over a period of time.  When you start quitting, seek help from your doctor, family, or friends. This information is not intended to replace advice given to you by your health care provider. Make sure you discuss any questions you have with your health care provider. Document Revised: 06/09/2019 Document Reviewed: 12/03/2018 Elsevier Patient Education  [place].

## 2020-12-19 NOTE — Progress Notes (Signed)
[facility] [phone]   [phone]  OFFICE PROGRESS NOTE  [name], MD [address] [place] [address]  DIAGNOSIS: extensive stage (T2 a, N2, M1 C) small cell lung cancer presented with right hilar mass in addition to mediastinal lymphadenopathy as well as metastatic disease to the bone with complete replacement of the L5 status post laminectomy with resection of epidural tumor as well as metastatic disease to the brain, bones and bilateral adrenal glands diagnosed in January 2022.   PRIOR THERAPY: Status post palliative radiotherapy to the resected area at L5 under the care of Dr. Lisbeth Renshaw.  CURRENT THERAPY: Systemic chemotherapy with carboplatin for AUC of 5 from day 1, etoposide 100 mg/M2 on days 1, 2 and 3 with Cosela 240 mg/M2 before chemotherapy in addition to Imfinzi 1500 mg IV every 3 weeks with day one of the chemotherapy.  First dose December 24, 2020.  INTERVAL HISTORY: Gregory Hodges 80 y.o. male returns to the clinic today for follow-up visit accompanied by his wife.  The patient is feeling fine today with no concerning complaints except for the right hip pain and increasing fatigue and weakness.  He denied having any current chest pain but has shortness of breath with exertion with mild cough and no hemoptysis.  He denied having any nausea, vomiting, diarrhea or constipation.  He has no headache or visual changes.  He has no recent weight loss or night sweats.  He had several studies performed recently including MRI of the brain that showed multiple brain metastasis in addition to PET scan that was performed earlier today.  The patient is here today for evaluation and discussion of his treatment options based on the recent imaging studies.  MEDICAL HISTORY: Past Medical History:  Diagnosis Date  . AAA (abdominal aortic aneurysm) (HCC)    s/p open repair using aortobifemoral graft 03/03/10 ([facility]), complicated by  wound dehisence, enterocutaneous fistula; developed aortic graft infection s/p explant of graft and placement of bilateral axillofemoral grafts 07/22/10 [facility])  . Cancer (HCC)    Skin  . Crohn's disease ([place])   . E coli bacteremia   . History of kidney stones   . Hyperlipemia   . Hypertension    "denies"  . Myocardial infarction ([place]) 08/11/2005   s/p Horizon study stent D1  . Peripheral vascular disease [facility]) June 2011  . 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  MEDICATIONS:  Current Outpatient Medications  Medication Sig Dispense Refill  . acetaminophen (TYLENOL) 500 MG tablet Take 500 mg by mouth every 6 (six) hours as needed for moderate pain.    Marland Kitchen aspirin 81 MG tablet Take 81 mg by mouth daily.    Marland Kitchen atenolol (TENORMIN) 25 MG tablet Take 25 mg by mouth 2 (two) times daily.    . calcium carbonate (TUMS - DOSED IN MG ELEMENTAL CALCIUM) 500 MG chewable tablet Chew 2 tablets by mouth daily as needed for indigestion or heartburn.    . diphenhydrAMINE (BENADRYL) 25 MG tablet Take 50 mg by mouth daily as needed for allergies.    . ferrous sulfate 325 (65 FE) MG tablet Take 325 mg by mouth every Monday, Wednesday, and Friday.    . nitroGLYCERIN (NITROSTAT) 0.4 MG SL tablet Place 0.4 mg under the tongue every 5 (five) minutes as needed for chest pain.    Marland Kitchen omeprazole (PRILOSEC) 10 MG capsule Take 10  mg by mouth daily.    Marland Kitchen oxymetazoline (AFRIN) 0.05 % nasal spray Place 1 spray into both nostrils 2 (two) times daily as needed for congestion.    . rosuvastatin (CRESTOR) 10 MG tablet Take 10 mg by mouth daily.    Marland Kitchen sulfamethoxazole-trimethoprim (BACTRIM DS) 800-160 MG per tablet Take 1 tablet by mouth 2 (two) times daily. 800-160 mg 60 tablet 11   No current facility-administered medications for this visit.    SURGICAL HISTORY:  Past Surgical History:  Procedure Laterality Date  . ABDOMINAL AORTIC ANEURYSM REPAIR   03/03/2010  . AXILLARY-FEMORAL BYPASS GRAFT  07/22/2010   bilateral  . bowel     herniated bowel  . CARDIAC CATHETERIZATION    . CORONARY STENT PLACEMENT    . CYSTOSCOPY    . LAMINECTOMY N/A 10/22/2020   Procedure: Lumbar five Open Laminectomy for tumor resection;  Surgeon: Judith Part, MD;  Location: [facility];  Service: Neurosurgery;  Laterality: N/A;  . MOHS SURGERY     several     REVIEW OF SYSTEMS:  Constitutional: positive for anorexia and fatigue Eyes: negative Ears, nose, mouth, throat, and face: negative Respiratory: positive for cough and dyspnea on exertion Cardiovascular: negative Gastrointestinal: positive for constipation Genitourinary:negative Integument/breast: negative Hematologic/lymphatic: negative Musculoskeletal:positive for bone pain Neurological: negative Behavioral/Psych: negative Endocrine: negative Allergic/Immunologic: negative   PHYSICAL EXAMINATION: General appearance: alert, cooperative, fatigued and no distress Head: Normocephalic, without obvious abnormality, atraumatic Neck: no adenopathy, no JVD, supple, symmetrical, trachea midline and thyroid not enlarged, symmetric, no tenderness/mass/nodules Lymph nodes: Cervical, supraclavicular, and axillary nodes normal. Resp: clear to auscultation bilaterally Back: symmetric, no curvature. ROM normal. No CVA tenderness. Cardio: regular rate and rhythm, S1, S2 normal, no murmur, click, rub or gallop GI: soft, non-tender; bowel sounds normal; no masses,  no organomegaly Extremities: extremities normal, atraumatic, no cyanosis or edema Neurologic: Alert and oriented X 3, normal strength and tone. Normal symmetric reflexes. Normal coordination and gait  ECOG PERFORMANCE STATUS: 1 - Symptomatic but completely ambulatory  Blood pressure (!) 118/56, pulse 78, temperature 97.7 F (36.5 C), temperature source Tympanic, resp. rate 15, height 5' 9"  (1.753 m), weight 172 lb 4.8 oz (78.2 kg), SpO2 98  %.  LABORATORY DATA: Lab Results  Component Value Date   WBC 5.5 12/17/2020   HGB 15.3 12/17/2020   HCT 47.1 12/17/2020   MCV 94.6 12/17/2020   PLT 149 (L) 12/17/2020      Chemistry      Component Value Date/Time   NA 141 12/17/2020 0730   K 5.2 (H) 12/17/2020 0730   CL 104 12/17/2020 0730   CO2 24 12/17/2020 0730   BUN 27 (H) 12/17/2020 0730   CREATININE 1.21 12/17/2020 0730      Component Value Date/Time   CALCIUM 9.3 12/17/2020 0730   ALKPHOS 91 12/17/2020 0730   AST 10 (L) 12/17/2020 0730   ALT <6 12/17/2020 0730   BILITOT 0.4 12/17/2020 0730       RADIOGRAPHIC STUDIES: MR Brain W Wo Contrast  Result Date: 12/17/2020 CLINICAL DATA:  80 year old male with non-small cell lung cancer diagnosed in January. Lumbar metastatic disease. Brain staging. EXAM: MRI HEAD WITHOUT AND WITH CONTRAST TECHNIQUE: Multiplanar, multiecho pulse sequences of the brain and surrounding structures were obtained without and with intravenous contrast. CONTRAST:  47m GADAVIST GADOBUTROL 1 MMOL/ML IV SOLN COMPARISON:  No prior brain imaging. FINDINGS: Brain: There are several enhancing brain lesions compatible with metastatic disease, although several of these also demonstrate lesser  intrinsic T1 signal which is likely a component of hemorrhage (series 12, image 31 the bilateral cerebellum which does have susceptibility on SWI). In total 6 enhancing lesions are identified ranging from 5-6 mm to the largest which are 13 mm in the right inferior cerebellum (series 16, image 30) and left posterosuperior temporal lobe (image 84). Three lesions are in the cerebellum. All lesions are annotated on series 16 and 20. Virtually no associated vasogenic edema. Minimal T2 and FLAIR hyperintensity at each lesion site. No intracranial mass effect or midline shift. No superimposed restricted diffusion to suggest acute infarction. No ventriculomegaly, extra-axial collection or acute intracranial hemorrhage. Cervicomedullary  junction and pituitary are within normal limits. Patchy and confluent bilateral white matter T2 and FLAIR hyperintensity in both hemispheres is nonspecific, along with similar patchy signal hyperintensity in the pons. No cortical encephalomalacia identified. No dural thickening. Vascular: Major intracranial vascular flow voids are preserved. Generalized intracranial artery tortuosity. The major dural venous sinuses are enhancing and appear to be patent. Skull and upper cervical spine: C3-C4 disc degeneration with small disc protrusion and degenerative spinal stenosis on series 7, image 13. Otherwise negative visible cervical spine and spinal cord. Visible bone marrow signal is within normal limits. Sinuses/Orbits: Negative orbits. Trace paranasal sinus mucosal thickening. Other: Mastoids are clear. Visible internal auditory structures appear normal. Visible scalp and face appear negative. IMPRESSION: 1. 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 Several show some internal hemorrhage, and 3 lesions are in the cerebellum. But there is no significant brain edema or mass effect at this time. 2. 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 3. 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 Electronically Signed   By: Genevie Ann M.D.   On: 12/17/2020 09:29    ASSESSMENT AND PLAN: This is a very pleasant 80 years old white male recently diagnosed with extensive stage (T2 a, N2, M1 C) small cell lung cancer presented with right upper lobe lesion with extensive mediastinal and right hilar adenopathy in addition to extensive bone metastasis as well as metastatic disease to the adrenal gland bilaterally and multiple brain lesions diagnosed in February 2022 status post resection of the epidural tumor at the L5 as well as palliative radiotherapy to the lesion after resection. I had a lengthy discussion with the patient and his wife today about his current  disease stage, prognosis and treatment options. The patient has multiple brain metastasis but he is currently asymptomatic and no evidence for vasogenic edema.  He also has extensive disease systemically. I explained to the patient that he has incurable condition and all the treatment will be of palliative nature. He was given the option of palliative care versus palliative systemic chemotherapy with carboplatin for AUC of 5 on day 1, etoposide 100 mg/M2 on days 1, 2 and 3 with Cosela before the chemotherapy as well as Imfinzi 1500 mg IV every 3 weeks on day one of the treatment. I discussed with the patient the adverse effect of the chemotherapy including but not limited to alopecia, myelosuppression, nausea and vomiting, peripheral neuropathy, liver or renal dysfunction as well as immunotherapy adverse effects.  We will hold on the whole brain irradiation for now until the patient received at least 2 cycles of systemic chemotherapy unless he has evidence for symptoms in the brain then we will proceed with the brain radiation.  The patient is interested in proceeding with the systemic chemotherapy as planned.  He  would also receive palliative radiotherapy to the right hip area during this time under the care of Dr. Lisbeth Renshaw. I discussed my recommendation with Dr. Lisbeth Renshaw as well as the patient and his wife and they are all in agreement with the current plan. I will call his pharmacy with prescription for Compazine 10 mg p.o. every 6 hours as needed for nausea as well as EMLA cream to be applied to the Port-A-Cath site before treatment. We will also arrange for the patient to have a Port-A-Cath placed for IV chemotherapy infusion. The patient will come back for follow-up visit in 2 weeks for evaluation and management of any adverse effect of his treatment. He will have a chemotherapy education class before the first dose of treatment. He was advised to call immediately if he has any other concerning symptoms in  the interval. The patient voices understanding of current disease status and treatment options and is in agreement with the current care plan.  All questions were answered. The patient knows to call the clinic with any problems, questions or concerns. We can certainly see the patient much sooner if necessary. The total time spent in the appointment was 60 minutes.  Disclaimer: This note was dictated with voice recognition software. Similar sounding words can inadvertently be transcribed and may not be corrected upon review.

## 2020-12-19 NOTE — Telephone Encounter (Signed)
PET scan findings-Called report from [facility] on PET scan-  "Based on the diffuse activity of the RIGHT pelvis  this patient would be a risk for pathologic fracture in this area."  Dr Julien Nordmann notified.

## 2020-12-20 ENCOUNTER — Telehealth: Payer: Self-pay

## 2020-12-20 NOTE — Telephone Encounter (Signed)
Patient called to inform Dr. Trula Slade that he will be starting chemo/radiation next week.

## 2020-12-23 ENCOUNTER — Telehealth: Payer: Self-pay | Admitting: *Deleted

## 2020-12-23 NOTE — Telephone Encounter (Signed)
Patient returned call about port concerns. Confirmed it should not interfere with the previous vascular surgery. Patient verbalized understanding.

## 2020-12-23 NOTE — Telephone Encounter (Signed)
Spoke with Dr. Orland Mustard at [facility] at [place] about pts concern for port placement interfering with his vascular interventions. Left message for patient saying his port would not interfere with vascular interventions and to call us back if he had questions.

## 2020-12-24 ENCOUNTER — Inpatient Hospital Stay: Payer: PPO

## 2020-12-24 ENCOUNTER — Telehealth: Payer: Self-pay | Admitting: Internal Medicine

## 2020-12-24 ENCOUNTER — Other Ambulatory Visit: Payer: Self-pay

## 2020-12-24 ENCOUNTER — Encounter: Payer: Self-pay | Admitting: Physician Assistant

## 2020-12-24 NOTE — Telephone Encounter (Signed)
Scheduled per 03/29 scheduling message per provider los, patient has been called and voicemail was left regarding upcoming appointments.

## 2020-12-24 NOTE — Telephone Encounter (Signed)
Called patient regarding updates regarding treatment, patient is aware of 03/29 appointment. Informed patient we are still working on getting treatment in.  [place].

## 2020-12-24 NOTE — Progress Notes (Signed)
Pharmacist Chemotherapy Monitoring - Initial Assessment    Anticipated start date: 12/31/20  Regimen:  . Are orders appropriate based on the patient's diagnosis, regimen, and cycle? Yes . Does the plan date match the patient's scheduled date? Yes . Is the sequencing of drugs appropriate? Yes . Are the premedications appropriate for the patient's regimen? Yes . Prior Authorization for treatment is: Approved o If applicable, is the correct biosimilar selected based on the patient's insurance? not applicable  Organ Function and Labs: Marland Kitchen Are dose adjustments needed based on the patient's renal function, hepatic function, or hematologic function? No . Are appropriate labs ordered prior to the start of patient's treatment? Yes . Other organ system assessment, if indicated: N/A . The following baseline labs, if indicated, have been ordered: durvalumab: baseline TSH +/- T4  Dose Assessment: . Are the drug doses appropriate? Yes . Are the following correct: o Drug concentrations Yes o IV fluid compatible with drug Yes o Administration routes Yes o Timing of therapy Yes . If applicable, does the patient have documented access for treatment and/or plans for port-a-cath placement? yes . If applicable, have lifetime cumulative doses been properly documented and assessed? yes Lifetime Dose Tracking  No doses have been documented on this patient for the following tracked chemicals: Doxorubicin, Epirubicin, Idarubicin, Daunorubicin, Mitoxantrone, Bleomycin, Oxaliplatin, Carboplatin, Liposomal Doxorubicin  o   Toxicity Monitoring/Prevention: . The patient has the following take home antiemetics prescribed: Ondansetron and Prochlorperazine . The patient has the following take home medications prescribed: N/A . Medication allergies and previous infusion related reactions, if applicable, have been reviewed and addressed. Yes . The patient's current medication list has been assessed for drug-drug  interactions with their chemotherapy regimen. no significant drug-drug interactions were identified on review.  Order Review: . Are the treatment plan orders signed? Yes . Is the patient scheduled to see a provider prior to their treatment? No  I verify that I have reviewed each item in the above checklist and answered each question accordingly.  Gregory Hodges, [place], 12/24/2020  1:21 PM

## 2020-12-24 NOTE — Progress Notes (Signed)
Met with patient/accompanying adult at registration to intorduce myself as Arboriculturist and to offer available resources.  Discussed one-time $1000 Radio broadcast assistant to assist with personal expenses while going through treatment. Provided verbal income guidelines. Advised what is needed to apply.  Also, advised if there is any available copay assistance, I would reach out to them regarding them.   Gave them my card if interested in applying and for any additional financial questions or concerns.

## 2020-12-25 ENCOUNTER — Telehealth: Payer: Self-pay | Admitting: Internal Medicine

## 2020-12-25 DIAGNOSIS — C3490 Malignant neoplasm of unspecified part of unspecified bronchus or lung: Secondary | ICD-10-CM | POA: Diagnosis not present

## 2020-12-25 DIAGNOSIS — Z510 Encounter for antineoplastic radiation therapy: Secondary | ICD-10-CM | POA: Diagnosis not present

## 2020-12-25 DIAGNOSIS — C7951 Secondary malignant neoplasm of bone: Secondary | ICD-10-CM | POA: Diagnosis not present

## 2020-12-25 NOTE — Telephone Encounter (Signed)
Appts updated per 3/29 sch msg. Updated calendar will be printed for pt at next visit.

## 2020-12-26 ENCOUNTER — Ambulatory Visit: Payer: PPO | Admitting: Radiation Oncology

## 2020-12-26 ENCOUNTER — Other Ambulatory Visit: Payer: Self-pay | Admitting: Student

## 2020-12-26 ENCOUNTER — Encounter: Payer: Self-pay | Admitting: *Deleted

## 2020-12-26 NOTE — Progress Notes (Signed)
I followed up on Gregory Hodges schedule. He needs a follow up appt with provider after his chemo.  I completed a scheduling message to call patient and set up with [name] on 4/12 after radiation and lab appt.

## 2020-12-26 NOTE — Patient Instructions (Signed)
Interventional radiology phone numbers [phone] After hours [phone]    You have skin glue (dermabond) over your new port. Do not use the lidocaine cream (EMLA cream) over the skin glue until it has healed. The petroleum in the lidocaine cream will dissolve the skin glue resulting in an infection of your new port. Use ice in a zip lock bag for 1-2 minutes over your new port before the cancer center nurses access your port.    Implanted Port Insertion, Care After This sheet gives you information about how to care for yourself after your procedure. Your health care provider may also give you more specific instructions. If you have problems or questions, contact your health care provider. What can I expect after the procedure? After the procedure, it is common to have:  Discomfort at the port insertion site.  Bruising on the skin over the port. This should improve over 3-4 days. Follow these instructions at home: [facility] care  After your port is placed, you will get a manufacturer's information card. The card has information about your port. Keep this card with you at all times.  Take care of the port as told by your health care provider. Ask your health care provider if you or a family member can get training for taking care of the port at home. A home health care nurse may also take care of the port.  Make sure to remember what type of port you have. Incision care  Follow instructions from your health care provider about how to take care of your port insertion site. Make sure you: ? Wash your hands with soap and water before and after you change your bandage (dressing). If soap and water are not available, use hand sanitizer. ? Change your dressing as told by your health care provider. ? Leave  skin glue in place. These skin closures may need to stay in place for 2 weeks or longer.   Check your port insertion site every day for signs of infection. Check for: ? Redness, swelling,  or pain. ? Fluid or blood. ? Warmth. ? Pus or a bad smell.      Activity  Return to your normal activities as told by your health care provider. Ask your health care provider what activities are safe for you.  Do not lift anything that is heavier than 10 lb (4.5 kg), or the limit that you are told, until your health care provider says that it is safe. General instructions  Take over-the-counter and prescription medicines only as told by your health care provider.  Do not take baths, swim, or use a hot tub until your health care provider approves.You may remove dressing tomorrow and shower around 2 PM.  Do not drive for 24 hours if you were given a sedative during your procedure.  Wear a medical alert bracelet in case of an emergency. This will tell any health care providers that you have a port.  Keep all follow-up visits as told by your health care provider. This is important. Contact a health care provider if:  You cannot flush your port with saline as directed, or you cannot draw blood from the port.  You have a fever or chills.  You have redness, swelling, or pain around your port insertion site.  You have fluid or blood coming from your port insertion site.  Your port insertion site feels warm to the touch.  You have pus or a bad smell coming from the port insertion site. Get  help right away if:  You have chest pain or shortness of breath.  You have bleeding from your port that you cannot control. Summary  Take care of the port as told by your health care provider. Keep the manufacturer's information card with you at all times.  Change your dressing as told by your health care provider.  Contact a health care provider if you have a fever or chills or if you have redness, swelling, or pain around your port insertion site.  Keep all follow-up visits as told by your health care provider. This information is not intended to replace advice given to you by your health  care provider. Make sure you discuss any questions you have with your health care provider. Document Revised: 04/12/2018 Document Reviewed: 04/12/2018 Elsevier Patient Education  2021 [place].     Moderate Conscious Sedation, Adult, Care After This sheet gives you information about how to care for yourself after your procedure. Your health care provider may also give you more specific instructions. If you have problems or questions, contact your health care provider. What can I expect after the procedure? After the procedure, it is common to have:  Sleepiness for several hours.  Impaired judgment for several hours.  Difficulty with balance.  Vomiting if you eat too soon. Follow these instructions at home: For the time period you were told by your health care provider:  Rest.  Do not participate in activities where you could fall or become injured.  Do not drive or use machinery.  Do not drink alcohol.  Do not take sleeping pills or medicines that cause drowsiness.  Do not make important decisions or sign legal documents.  Do not take care of children on your own.        Eating and drinking  Follow the diet recommended by your health care provider.  Drink enough fluid to keep your urine pale yellow.  If you vomit: ? Drink water, juice, or soup when you can drink without vomiting. ? Make sure you have little or no nausea before eating solid foods.    General instructions  Take over-the-counter and prescription medicines only as told by your health care provider.  Have a responsible adult stay with you for the time you are told. It is important to have someone help care for you until you are awake and alert.  Do not smoke.  Keep all follow-up visits as told by your health care provider. This is important. Contact a health care provider if:  You are still sleepy or having trouble with balance after 24 hours.  You feel light-headed.  You keep feeling  nauseous or you keep vomiting.  You develop a rash.  You have a fever.  You have redness or swelling around the IV site. Get help right away if:  You have trouble breathing.  You have new-onset confusion at home. Summary  After the procedure, it is common to feel sleepy, have impaired judgment, or feel nauseous if you eat too soon.  Rest after you get home. Know the things you should not do after the procedure.  Follow the diet recommended by your health care provider and drink enough fluid to keep your urine pale yellow.  Get help right away if you have trouble breathing or new-onset confusion at home. This information is not intended to replace advice given to you by your health care provider. Make sure you discuss any questions you have with your health care provider. Document Revised: 01/12/2020  Document Reviewed: 08/10/2019 Elsevier Patient Education  [place].

## 2020-12-27 ENCOUNTER — Other Ambulatory Visit: Payer: Self-pay | Admitting: Internal Medicine

## 2020-12-27 ENCOUNTER — Telehealth: Payer: Self-pay | Admitting: Physician Assistant

## 2020-12-27 ENCOUNTER — Ambulatory Visit (HOSPITAL_COMMUNITY)
Admission: RE | Admit: 2020-12-27 | Discharge: 2020-12-27 | Disposition: A | Discharge status: Home / Self Care | Payer: PPO | Source: Ambulatory Visit | Attending: Internal Medicine | Admitting: Internal Medicine

## 2020-12-27 ENCOUNTER — Ambulatory Visit: Payer: PPO

## 2020-12-27 ENCOUNTER — Other Ambulatory Visit: Payer: Self-pay

## 2020-12-27 ENCOUNTER — Encounter (HOSPITAL_COMMUNITY): Payer: Self-pay

## 2020-12-27 DIAGNOSIS — C779 Secondary and unspecified malignant neoplasm of lymph node, unspecified: Secondary | ICD-10-CM | POA: Diagnosis not present

## 2020-12-27 DIAGNOSIS — C3490 Malignant neoplasm of unspecified part of unspecified bronchus or lung: Secondary | ICD-10-CM | POA: Insufficient documentation

## 2020-12-27 DIAGNOSIS — C7951 Secondary malignant neoplasm of bone: Secondary | ICD-10-CM | POA: Diagnosis not present

## 2020-12-27 DIAGNOSIS — C3491 Malignant neoplasm of unspecified part of right bronchus or lung: Secondary | ICD-10-CM

## 2020-12-27 DIAGNOSIS — E785 Hyperlipidemia, unspecified: Secondary | ICD-10-CM | POA: Insufficient documentation

## 2020-12-27 DIAGNOSIS — I10 Essential (primary) hypertension: Secondary | ICD-10-CM | POA: Diagnosis not present

## 2020-12-27 DIAGNOSIS — Z87891 Personal history of nicotine dependence: Secondary | ICD-10-CM | POA: Diagnosis not present

## 2020-12-27 DIAGNOSIS — Z452 Encounter for adjustment and management of vascular access device: Secondary | ICD-10-CM | POA: Diagnosis not present

## 2020-12-27 DIAGNOSIS — C7931 Secondary malignant neoplasm of brain: Secondary | ICD-10-CM | POA: Diagnosis not present

## 2020-12-27 HISTORY — PX: IR IMAGING GUIDED PORT INSERTION: IMG5740

## 2020-12-27 IMAGING — US IR IMAGING GUIDED PORT INSERTION
2 series · 2 of 2 positions shown · non-contrast
Comparison: none

INDICATION: Metastatic small cell lung cancer

[Series 1: (id) · 1 of 1 slices shown]
[im 1/1]
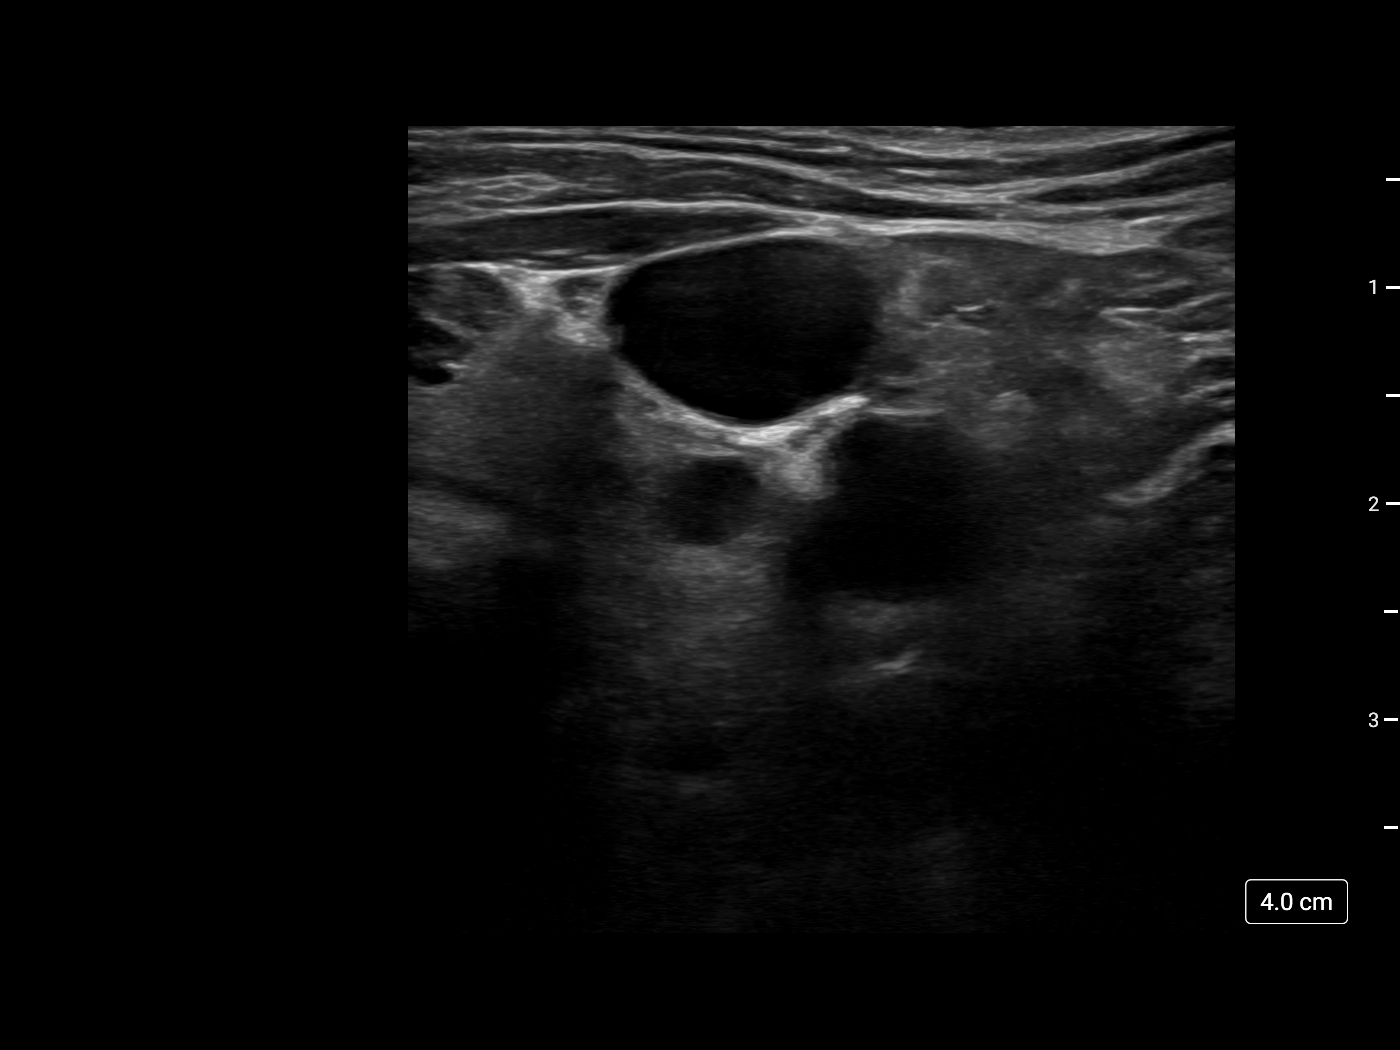

[Series 300: ir fluoro guide cv line right · 1 of 1 slices shown]
[im 1/1]
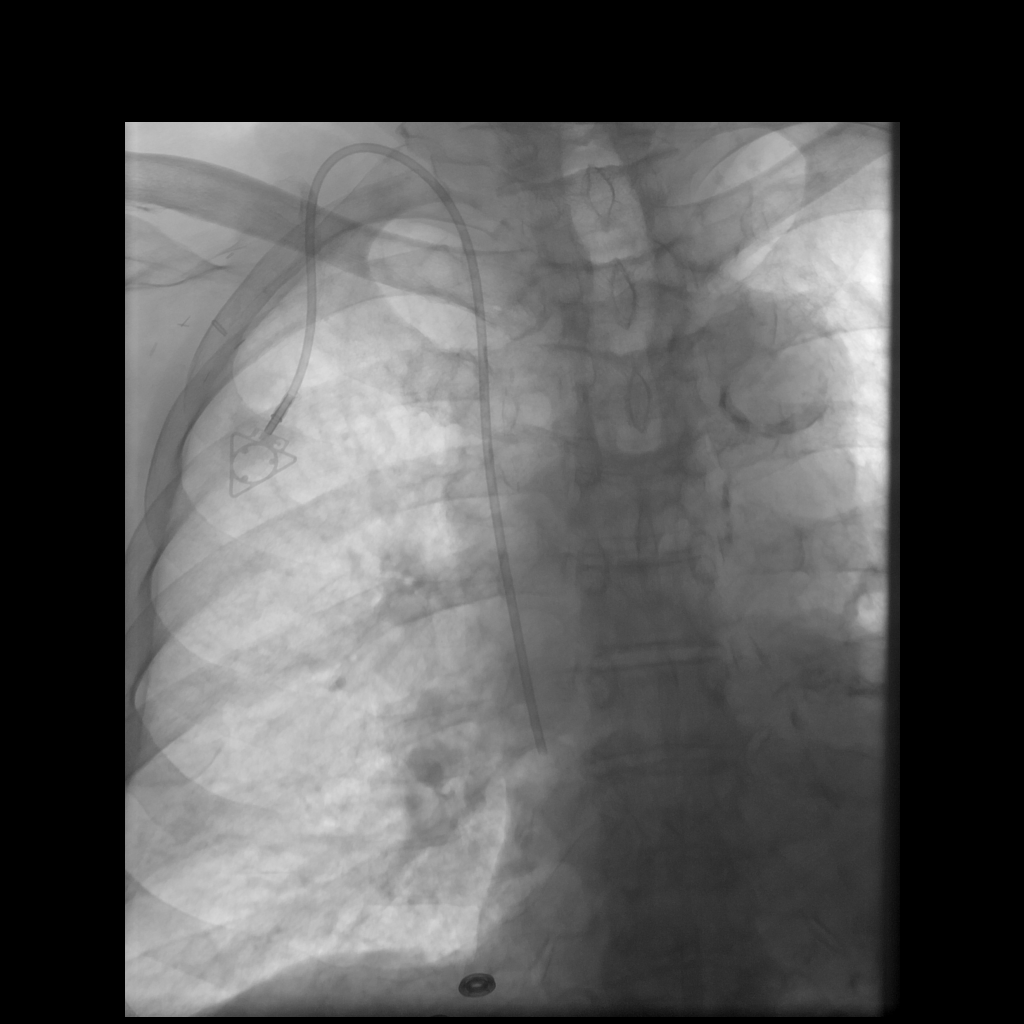

[2 of 2 positions shown; findings below may reference images not displayed]

EXAM:
IMPLANTED PORT A CATH PLACEMENT WITH ULTRASOUND AND FLUOROSCOPIC
GUIDANCE

MEDICATIONS:
None

ANESTHESIA/SEDATION:
Moderate (conscious) sedation was employed during this procedure. A
total of Versed to mg and Fentanyl 100 mcg was administered
intravenously.

Moderate Sedation Time: 21 minutes. The patient's level of
consciousness and vital signs were monitored continuously by
radiology nursing throughout the procedure under my direct
supervision.

FLUOROSCOPY TIME:  One minutes, 0 seconds (9 mGy)

COMPLICATIONS:
None immediate.

PROCEDURE:
The procedure, risks, benefits, and alternatives were explained to
the patient. Questions regarding the procedure were encouraged and
answered. The patient understands and consents to the procedure.

A timeout was performed prior to the initiation of the procedure.

Patient positioned supine on the angiography table.

Right neck and anterior upper chest prepped and draped in the usual
sterile fashion. All elements of maximal sterile barrier were
utilized including, cap, mask, sterile gown, sterile gloves, large
sterile drape, hand scrubbing and 2% Chlorhexidine for skin
cleaning.

The right internal jugular vein was evaluated with ultrasound and
shown to be patent. A permanent ultrasound image was obtained and
placed in the patient's medical record. Local anesthesia was
provided with 1% lidocaine with epinephrine.

Using sterile gel and a sterile probe cover, the right internal
jugular vein was entered with a 21 ga needle during real time
ultrasound guidance.

0.018 inch guidewire placed and 21 ga needle exchanged for
transitional dilator set. Utilizing fluoroscopy, 0.035 inch
guidewire advanced through the needle without difficulty.

Attention then turned to the right anterior upper chest. Following
local lidocaine administration, a port pocket was created. The
catheter was connected to the port and brought from the pocket to
the venotomy site through a subcutaneous tunnel.

The catheter was cut to size and inserted through the peel-away
sheath. The catheter tip was positioned at the cavoatrial junction
using fluoroscopic guidance.

The port aspirated and flushed well. The port pocket was closed with
deep and superficial absorbable suture. The port pocket incision and
venotomy sites were also sealed with Dermabond.
IMPRESSION: Successful placement of a right internal jugular approach power
injectable Port-A-Cath. The catheter is ready for immediate use.

## 2020-12-27 MED ORDER — MIDAZOLAM HCL 2 MG/2ML IJ SOLN
INTRAMUSCULAR | Status: AC | PRN
  Administered 2020-12-27: 0.5 mg via INTRAVENOUS
  Administered 2020-12-27: 1 mg via INTRAVENOUS
  Administered 2020-12-27: 0.5 mg via INTRAVENOUS

## 2020-12-27 MED ORDER — HEPARIN SOD (PORK) LOCK FLUSH 100 UNIT/ML IV SOLN
INTRAVENOUS | Status: AC | PRN
  Administered 2020-12-27: 500 [IU] via INTRAVENOUS

## 2020-12-27 MED ORDER — HEPARIN SOD (PORK) LOCK FLUSH 100 UNIT/ML IV SOLN
INTRAVENOUS | Status: AC
  Filled 2020-12-27: qty 5

## 2020-12-27 MED ORDER — FENTANYL CITRATE (PF) 100 MCG/2ML IJ SOLN
INTRAMUSCULAR | Status: AC | PRN
  Administered 2020-12-27 (×2): 25 ug via INTRAVENOUS
  Administered 2020-12-27: 50 ug via INTRAVENOUS

## 2020-12-27 MED ORDER — SODIUM CHLORIDE 0.9 % IV SOLN: INTRAVENOUS | Status: DC

## 2020-12-27 MED ORDER — FENTANYL CITRATE (PF) 100 MCG/2ML IJ SOLN
INTRAMUSCULAR | Status: AC
  Filled 2020-12-27: qty 2

## 2020-12-27 MED ORDER — MIDAZOLAM HCL 2 MG/2ML IJ SOLN
INTRAMUSCULAR | Status: AC
  Filled 2020-12-27: qty 4

## 2020-12-27 MED ORDER — LIDOCAINE HCL (PF) 1 % IJ SOLN
INTRAMUSCULAR | Status: AC | PRN
  Administered 2020-12-27: 10 mL

## 2020-12-27 MED ORDER — LIDOCAINE-EPINEPHRINE (PF) 2 %-1:200000 IJ SOLN
INTRAMUSCULAR | Status: AC
  Filled 2020-12-27: qty 20

## 2020-12-27 NOTE — Telephone Encounter (Signed)
Scheduled appt per 3/31 sch msg. Pt aware.

## 2020-12-27 NOTE — Consult Note (Signed)
Chief Complaint: Patient was seen in consultation today for port a cath placement  Referring Physician(s): Mohamed,Mohamed  Supervising Physician: Mir, Biochemist, clinical  Patient Status: [place] [facility] - Out-pt  History of Present Illness: Gregory Hodges is a 80 y.o. male, ex smoker,  with PMH AAA with prior AFBG/bilat axillofem grafts 2011, Chron's dz, nephrolithiasis,HLD,HTN,MI, PVD . 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 He presents today for port a cath placement for palliative chemotherapy.   Past Medical History:  Diagnosis Date  . AAA (abdominal aortic aneurysm) (HCC)    s/p open repair using aortobifemoral graft 03/03/10 ([facility]), complicated by wound dehisence, enterocutaneous fistula; developed aortic graft infection s/p explant of graft and placement of bilateral axillofemoral grafts 07/22/10 [facility])  . Cancer (HCC)    Skin  . Crohn's disease ([place])   . E coli bacteremia   . History of kidney stones   . Hyperlipemia   . Hypertension    "denies"  . Myocardial infarction ([place]) 08/11/2005   s/p Horizon study stent D1  . Peripheral vascular disease [facility]) June 2011  . 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 ABDOMINAL AORTIC ANEURYSM REPAIR  03/03/2010  . AXILLARY-FEMORAL BYPASS GRAFT  07/22/2010   bilateral  . bowel     herniated bowel  . CARDIAC CATHETERIZATION    . CORONARY STENT PLACEMENT    . CYSTOSCOPY    . LAMINECTOMY N/A 10/22/2020   Procedure: Lumbar five Open Laminectomy for tumor resection;  Surgeon:  Judith Part, MD;  Location: [place];  Service: Neurosurgery;  Laterality: N/A;  . MOHS SURGERY     several     Allergies: Oxycodone, Codeine, and Lisinopril  Medications: Prior to Admission medications   Medication Sig Start Date End Date Taking? Authorizing Provider  acetaminophen (TYLENOL) 500 MG tablet Take 500 mg by mouth every 6 (six) hours as needed for moderate pain.   Yes [provider]  aspirin 81 MG tablet Take 81 mg by mouth daily.   Yes [provider]  atenolol (TENORMIN) 25 MG tablet Take 25 mg by mouth 2 (two) times daily.   Yes [provider]  diphenhydrAMINE (BENADRYL) 25 MG tablet Take 50 mg by mouth daily as needed for allergies.   Yes [provider]  ferrous sulfate 325 (65 FE) MG tablet Take 325 mg by mouth every Monday, Wednesday, and Friday.   Yes [provider]  omeprazole (PRILOSEC) 10 MG capsule Take 10 mg by mouth daily. 09/30/20  Yes [provider]  oxymetazoline (AFRIN) 0.05 % nasal spray Place 1 spray into both nostrils 2 (two) times daily as needed for congestion.   Yes [provider]  rosuvastatin (CRESTOR) 10 MG tablet Take 10 mg by mouth daily.   Yes [provider]  sulfamethoxazole-trimethoprim (BACTRIM DS) 800-160 MG per tablet Take 1 tablet by mouth 2 (two) times daily. 800-160 mg 03/29/14  Yes Tommy Medal, Lavell Islam, MD  calcium carbonate (TUMS - DOSED IN MG ELEMENTAL CALCIUM) 500 MG chewable tablet Chew 2 tablets by mouth daily as  needed for indigestion or heartburn.    [provider]  lidocaine-prilocaine (EMLA) cream Apply to the Port-A-Cath site 30-60 minutes before treatment 12/19/20   Curt Bears, MD  nitroGLYCERIN (NITROSTAT) 0.4 MG SL tablet Place 0.4 mg under the tongue every 5 (five) minutes as needed for chest pain.    [provider]  prochlorperazine (COMPAZINE) 10 MG tablet Take 1 tablet (10 mg total) by mouth every 6 (six) hours as needed  for nausea or vomiting. 12/19/20   Curt Bears, MD     History reviewed. No pertinent family history.  Social History   Socioeconomic History  . Marital status: Married    Spouse name: Not on file  . Number of children: Not on file  . Years of education: Not on file  . Highest education level: Not on file  Occupational History  . Not on file  Tobacco Use  . Smoking status: Current Every Day Smoker    Packs/day: 1.00    Years: 30.00    Pack years: 30.00    Types: Cigarettes  . Smokeless tobacco: Never Used  Vaping Use  . Vaping Use: Never used  Substance and Sexual Activity  . Alcohol use: Not Currently    Comment: special ocassional  . Drug use: No  . Sexual activity: Not on file  Other Topics Concern  . Not on file  Social History Narrative  . Not on file   Social Determinants of Health   Financial Resource Strain: Not on file  Food Insecurity: Not on file  Transportation Needs: Not on file  Physical Activity: Not on file  Stress: Not on file  Social Connections: Not on file      Review of Systems denies fever,HA, CP, worsening dyspnea, abd pain, back pain,N/V or bleeding. He does have occ cough  Vital Signs: BP (!) 149/68   Pulse 70   Temp 98.2 F (36.8 C) (Oral)   Resp (!) 24   Ht 5' 9"  (1.753 m)   Wt 178 lb (80.7 kg)   SpO2 94%   BMI 26.29 kg/m   Physical Exam: awake/alert; chest- scatt rhonchi bilat; heart- RRR; abd- soft,+BS,NT; no LE edema  Imaging: MR Brain W Wo Contrast  Result Date: 12/17/2020 CLINICAL DATA:  80 year old male with non-small cell lung cancer diagnosed in January. Lumbar metastatic disease. Brain staging. EXAM: MRI HEAD WITHOUT AND WITH CONTRAST TECHNIQUE: Multiplanar, multiecho pulse sequences of the brain and surrounding structures were obtained without and with intravenous contrast. CONTRAST:  7m GADAVIST GADOBUTROL 1 MMOL/ML IV SOLN COMPARISON:  No prior brain imaging. FINDINGS: Brain: There are several enhancing brain  lesions compatible with metastatic disease, although several of these also demonstrate lesser intrinsic T1 signal which is likely a component of hemorrhage (series 12, image 31 the bilateral cerebellum which does have susceptibility on SWI). In total 6 enhancing lesions are identified ranging from 5-6 mm to the largest which are 13 mm in the right inferior cerebellum (series 16, image 30) and left posterosuperior temporal lobe (image 84). Three lesions are in the cerebellum. All lesions are annotated on series 16 and 20. Virtually no associated vasogenic edema. Minimal T2 and FLAIR hyperintensity at each lesion site. No intracranial mass effect or midline shift. No superimposed restricted diffusion to suggest acute infarction. No ventriculomegaly, extra-axial collection or acute intracranial hemorrhage. Cervicomedullary junction and pituitary are within normal limits. Patchy and confluent bilateral white matter T2 and FLAIR hyperintensity in both hemispheres is nonspecific, along with similar patchy signal  hyperintensity in the pons. No cortical encephalomalacia identified. No dural thickening. Vascular: Major intracranial vascular flow voids are preserved. Generalized intracranial artery tortuosity. The major dural venous sinuses are enhancing and appear to be patent. Skull and upper cervical spine: C3-C4 disc degeneration with small disc protrusion and degenerative spinal stenosis on series 7, image 13. Otherwise negative visible cervical spine and spinal cord. Visible bone marrow signal is within normal limits. Sinuses/Orbits: Negative orbits. Trace paranasal sinus mucosal thickening. Other: Mastoids are clear. Visible internal auditory structures appear normal. Visible scalp and face appear negative. IMPRESSION: 1. 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 Several show some internal hemorrhage, and 3 lesions are in the cerebellum. But there is no significant brain edema or mass effect at this  time. 2. 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 3. 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 Electronically Signed   By: Genevie Ann M.D.   On: 12/17/2020 09:29   NM PET Image Initial (PI) Skull Base To Thigh  Result Date: 12/19/2020 CLINICAL DATA:  Initial treatment strategy for small cell lung cancer staging. EXAM: NUCLEAR MEDICINE PET SKULL BASE TO THIGH TECHNIQUE: 8.6 mCi F-18 FDG was injected intravenously. Full-ring PET imaging was performed from the skull base to thigh after the radiotracer. CT data was obtained and used for attenuation correction and anatomic localization. Fasting blood glucose: 85 mg/dl COMPARISON:  October 16, 2020 FINDINGS: Mediastinal blood pool activity: SUV max 1.91 Liver activity: SUV max NA NECK: No hypermetabolic lymph nodes in the neck. Incidental CT findings: none CHEST: RIGHT mediastinal and RIGHT hilar adenopathy, confluent and hypermetabolic. Maximum SUV about the RIGHT paratracheal chain 13.1 (image 70, series 4) at the site of 2.1 cm RIGHT paratracheal lymph node with other small nodes in this location, indistinguishable from this node on the FDG portion of the exam. Fullness of the RIGHT hilum with marked hypermetabolic activity on image 77 of series 4 maximum SUV 14.8. Lymph nodes measuring up to 2 cm short axis, better defined on the recent CT of the chest from January. Soft tissue surrounds bronchial and vascular structures difficult separate due to lack of contrast peer small RIGHT pleural effusion with adjacent hypermetabolic changes in RIGHT posterior ribs, see below. Small RIGHT pulmonary nodule measuring 14 mm in approximate dimension, limited measurement by respiratory motion (image 42, series 8) previously 9 mm also with intense metabolic activity that blends with hilar activity seen on today's evaluation. Incidental CT findings: Aortic atherosclerosis. No aneurysmal dilation. Three-vessel coronary  artery disease. No pericardial effusion with mild cardiac enlargement. Central pulmonary vasculature is normal caliber. Signs of bilateral axillofemoral bypass. Large hiatal hernia extends into the chest as on the recent CT evaluation. Signs of pulmonary emphysema and interval development of a small RIGHT-sided pleural effusion. ABDOMEN/PELVIS: Bilateral hypermetabolic adrenal masses. LEFT adrenal measuring 3.6 x 1.8 cm within 1-2 mm of previous measurement with a maximum SUV of 14.4. RIGHT adrenal also hypermetabolic with a maximum SUV of 10, lesion measuring approximately 3.2 x 1.7 cm. Previously this area measured approximately 2.9 x 1.6 cm. No retroperitoneal adenopathy but with signs of pelvic lymphadenopathy in the bilateral external iliac chains. Hypermetabolic lymph node on image 171 of series 4 in the LEFT external iliac chain measuring 11 mm with a maximum SUV of 8. Hypermetabolic lymph node on image 172 of series 4 with a maximum SUV of 9.8 measuring 11 mm. Smaller lymph node just above this  measuring 8-9 mm with similar metabolic activity. Incidental CT findings: Mildly lobular contour of the liver with subtle low-density foci without corresponding FDG uptake, likely small cysts. Gallbladder without pericholecystic stranding. Pancreas with normal appearance. Spleen, kidneys, stomach, small and large bowel without acute process. Large ventral abdominal wall hernia, rectus diastasis approximately 10 cm. Sigmoid diverticular disease and diverticular changes scattered about the remainder of the colon. Normal appendix. LEFT renal cysts. Nephrolithiasis in the bilateral kidneys similar to recent imaging. Vascular disease with resection of the infrarenal abdominal aorta and axillobifemoral bypass grafting. SKELETON: RIGHT humeral metastasis on image 28 of series 4 with a maximum SUV of 8.8, no corresponding destruction but with subtle added density in the medullary space of the RIGHT proximal humerus at this  level. Rib destruction of the LEFT fourth rib. Soft tissue thickening on image 70 of series 4 measuring approximately 2.3 cm greatest thickness but with diffuse involvement of the rib extending anteriorly along the LEFT anterolateral chest and a maximum SUV of 11.3. RIGHT posterior seventh rib with pathologic fracture and diffuse, similar involvement with a maximum SUV of 9.7 showing permeative destructive changes in both of these areas Diffuse acetabular, lower iliac and RIGHT pubic bone involvement on images 177-192 showing mottled permeative destructive changes that are subtle and extensive FDG uptake, maximum SUV of approximately 11 Incidental CT findings: L5 with sclerosis and fracture which is age indeterminate but present on the previous study showing little FDG uptake, maximum SUV of 2.9. Spinal degenerative changes. IMPRESSION: Small RIGHT lung lesion with extensive mediastinal and RIGHT hilar adenopathy and associated bony in visceral metastatic disease in this patient with small cell lung cancer. Presumed pathologic fracture of the L4 vertebral body but with less FDG uptake than other areas involving ribs, RIGHT proximal humerus and RIGHT pelvis. Based on the diffuse activity of the RIGHT pelvis this patient would be a risk for pathologic fracture in this area. Pathologic fracture of RIGHT-sided ribs, now segmental involving RIGHT posterior seventh rib, associated with small RIGHT-sided effusion. Enlarging RIGHT adrenal metastasis and potential enlargement of the primary lesion in short interval. Signs of vascular disease and coronary artery disease with axillobifemoral bypass grafting. These results will be called to the ordering clinician or representative by the Radiologist Assistant, and communication documented in the PACS or [employer]. Aortic Atherosclerosis (ICD10-I70.0) and Emphysema (ICD10-J43.9). Electronically Signed   By: Zetta Bills M.D.   On: 12/19/2020 14:18     Labs:  CBC: Recent Labs    10/18/20 1046 12/03/20 1335 12/17/20 0730  WBC 9.9 4.0 5.5  HGB 17.0 14.8 15.3  HCT 55.3* 47.4 47.1  PLT 145* 117* 149*    COAGS: No results for input(s): INR, APTT in the last 8760 hours.  BMP: Recent Labs    10/16/20 1624 10/18/20 1046 12/03/20 1335 12/17/20 0730  NA  --  140 140 141  K  --  5.4* 4.5 5.2*  CL  --  102 106 104  CO2  --  28 26 24   GLUCOSE  --  76 89 80  BUN  --  39* 14 27*  CALCIUM  --  9.2 8.5* 9.3  CREATININE 1.50* 1.51* 1.05 1.21  GFRNONAA  --  47* >60 >60    LIVER FUNCTION TESTS: Recent Labs    10/18/20 1046 12/03/20 1335 12/17/20 0730  BILITOT 0.6 0.3 0.4  AST 13* 10* 10*  ALT 10 6 <6  ALKPHOS 56 79 91  PROT 6.5 6.2* 6.8  ALBUMIN 3.6  3.4* 3.3*    TUMOR MARKERS: No results for input(s): AFPTM, CEA, CA199, CHROMGRNA in the last 8760 hours.  Assessment and Plan: 80 y.o. male, ex smoker,  with PMH AAA with prior AFBG/bilat axillofem grafts 2011, Chron's dz, nephrolithiasis,HLD,HTN,MI, PVD . 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 He presents today for port a cath placement for palliative chemotherapy.Risks and benefits of image guided port-a-catheter placement was discussed with the patient including, but not limited to bleeding, infection, pneumothorax, or fibrin sheath development and need for additional procedures.  All of the patient's questions were answered, patient is agreeable to proceed. Consent signed and in chart.     Thank you for this interesting consult.  I greatly enjoyed meeting  Gregory Hodges and look forward to participating in their care.  A copy of this report was sent to the requesting provider on this date.  Electronically Signed: D. Rowe Robert, PA-C 12/27/2020, 1:53 PM   I spent a total of 25 minutes  in face to face in clinical consultation, greater than 50% of which was counseling/coordinating care for port a cath placement

## 2020-12-27 NOTE — Procedures (Signed)
Interventional Radiology Procedure Note  Procedure: Port placement.  Indication: Lung cancer  Findings: Please refer to procedural dictation for full description.  Complications: None  EBL: < 10 mL  Miachel Roux, MD [phone]

## 2020-12-27 NOTE — Discharge Instructions (Signed)
Interventional radiology phone numbers [phone] After hours [phone]    You have skin glue (dermabond) over your new port. Do not use the lidocaine cream (EMLA cream) over the skin glue until it has healed. The petroleum in the lidocaine cream will dissolve the skin glue resulting in an infection of your new port. Use ice in a zip lock bag for 1-2 minutes over your new port before the cancer center nurses access your port.   Implanted Port Insertion, Care After This sheet gives you information about how to care for yourself after your procedure. Your health care provider may also give you more specific instructions. If you have problems or questions, contact your health care provider. What can I expect after the procedure? After the procedure, it is common to have:  Discomfort at the port insertion site.  Bruising on the skin over the port. This should improve over 3-4 days. Follow these instructions at home: [facility] care  After your port is placed, you will get a manufacturer's information card. The card has information about your port. Keep this card with you at all times.  Take care of the port as told by your health care provider. Ask your health care provider if you or a family member can get training for taking care of the port at home. A home health care nurse may also take care of the port.  Make sure to remember what type of port you have. Incision care 1. Follow instructions from your health care provider about how to take care of your port insertion site. Make sure you: ? Wash your hands with soap and water before and after you change your bandage (dressing). If soap and water are not available, use hand sanitizer. ? Change your dressing as told by your health care provider. 2. Leave skin glue strips in place. These skin closures may need to stay in place for 2 weeks or longer.  3. Check your port insertion site every day for signs of infection. Check for: ? Redness,  swelling, or pain. ? Fluid or blood. ? Warmth. ? Pus or a bad smell.      Activity  Return to your normal activities as told by your health care provider. Ask your health care provider what activities are safe for you.  Do not lift anything that is heavier than 10 lb (4.5 kg), or the limit that you are told, until your health care provider says that it is safe. General instructions  Take over-the-counter and prescription medicines only as told by your health care provider.  Do not take baths, swim, or use a hot tub until your health care provider approves.You may remove your dressing tomorrow and shower.  Do not drive for 24 hours if you were given a sedative during your procedure.  Wear a medical alert bracelet in case of an emergency. This will tell any health care providers that you have a port.  Keep all follow-up visits as told by your health care provider. This is important. Contact a health care provider if:  You cannot flush your port with saline as directed, or you cannot draw blood from the port.  You have a fever or chills.  You have redness, swelling, or pain around your port insertion site.  You have fluid or blood coming from your port insertion site.  Your port insertion site feels warm to the touch.  You have pus or a bad smell coming from the port insertion site. Get help right away  if:  You have chest pain or shortness of breath.  You have bleeding from your port that you cannot control. Summary  Take care of the port as told by your health care provider. Keep the manufacturer's information card with you at all times.  Change your dressing as told by your health care provider.  Contact a health care provider if you have a fever or chills or if you have redness, swelling, or pain around your port insertion site.  Keep all follow-up visits as told by your health care provider. This information is not intended to replace advice given to you by your  health care provider. Make sure you discuss any questions you have with your health care provider. Document Revised: 04/12/2018 Document Reviewed: 04/12/2018 Elsevier Patient Education  2021 [place].    Moderate Conscious Sedation, Adult, Care After This sheet gives you information about how to care for yourself after your procedure. Your health care provider may also give you more specific instructions. If you have problems or questions, contact your health care provider. What can I expect after the procedure? After the procedure, it is common to have:  Sleepiness for several hours.  Impaired judgment for several hours.  Difficulty with balance.  Vomiting if you eat too soon. Follow these instructions at home: For the time period you were told by your health care provider:  Rest.  Do not participate in activities where you could fall or become injured.  Do not drive or use machinery.  Do not drink alcohol.  Do not take sleeping pills or medicines that cause drowsiness.  Do not make important decisions or sign legal documents.  Do not take care of children on your own.      Eating and drinking 4. Follow the diet recommended by your health care provider. 5. Drink enough fluid to keep your urine pale yellow. 6. If you vomit: ? Drink water, juice, or soup when you can drink without vomiting. ? Make sure you have little or no nausea before eating solid foods.   General instructions  Take over-the-counter and prescription medicines only as told by your health care provider.  Have a responsible adult stay with you for the time you are told. It is important to have someone help care for you until you are awake and alert.  Do not smoke.  Keep all follow-up visits as told by your health care provider. This is important. Contact a health care provider if:  You are still sleepy or having trouble with balance after 24 hours.  You feel light-headed.  You keep feeling  nauseous or you keep vomiting.  You develop a rash.  You have a fever.  You have redness or swelling around the IV site. Get help right away if:  You have trouble breathing.  You have new-onset confusion at home. Summary  After the procedure, it is common to feel sleepy, have impaired judgment, or feel nauseous if you eat too soon.  Rest after you get home. Know the things you should not do after the procedure.  Follow the diet recommended by your health care provider and drink enough fluid to keep your urine pale yellow.  Get help right away if you have trouble breathing or new-onset confusion at home. This information is not intended to replace advice given to you by your health care provider. Make sure you discuss any questions you have with your health care provider. Document Revised: 01/12/2020 Document Reviewed: 08/10/2019 Elsevier Patient Education  [place].

## 2020-12-30 ENCOUNTER — Other Ambulatory Visit: Payer: Self-pay

## 2020-12-30 ENCOUNTER — Ambulatory Visit
Admission: RE | Admit: 2020-12-30 | Discharge: 2020-12-30 | Disposition: A | Discharge status: Home / Self Care | Payer: PPO | Source: Ambulatory Visit | Attending: Radiation Oncology | Admitting: Radiation Oncology

## 2020-12-30 DIAGNOSIS — Z510 Encounter for antineoplastic radiation therapy: Secondary | ICD-10-CM | POA: Insufficient documentation

## 2020-12-30 DIAGNOSIS — C7951 Secondary malignant neoplasm of bone: Secondary | ICD-10-CM | POA: Diagnosis not present

## 2020-12-30 DIAGNOSIS — C3490 Malignant neoplasm of unspecified part of unspecified bronchus or lung: Secondary | ICD-10-CM | POA: Diagnosis not present

## 2020-12-31 ENCOUNTER — Encounter: Payer: Self-pay | Admitting: *Deleted

## 2020-12-31 ENCOUNTER — Ambulatory Visit
Admission: RE | Admit: 2020-12-31 | Discharge: 2020-12-31 | Disposition: A | Discharge status: Home / Self Care | Payer: PPO | Source: Ambulatory Visit | Attending: Radiation Oncology | Admitting: Radiation Oncology

## 2020-12-31 ENCOUNTER — Inpatient Hospital Stay: Payer: PPO

## 2020-12-31 ENCOUNTER — Other Ambulatory Visit: Payer: PPO

## 2020-12-31 ENCOUNTER — Ambulatory Visit: Payer: PPO | Admitting: Physician Assistant

## 2020-12-31 ENCOUNTER — Inpatient Hospital Stay: Payer: PPO | Attending: Neurological Surgery

## 2020-12-31 VITALS — BP 121/62 | HR 65 | Temp 98.5°F | Resp 18 | Wt 171.0 lb

## 2020-12-31 DIAGNOSIS — C7972 Secondary malignant neoplasm of left adrenal gland: Secondary | ICD-10-CM | POA: Insufficient documentation

## 2020-12-31 DIAGNOSIS — I10 Essential (primary) hypertension: Secondary | ICD-10-CM | POA: Diagnosis not present

## 2020-12-31 DIAGNOSIS — Z95828 Presence of other vascular implants and grafts: Secondary | ICD-10-CM | POA: Insufficient documentation

## 2020-12-31 DIAGNOSIS — Z5111 Encounter for antineoplastic chemotherapy: Secondary | ICD-10-CM | POA: Diagnosis not present

## 2020-12-31 DIAGNOSIS — C3491 Malignant neoplasm of unspecified part of right bronchus or lung: Secondary | ICD-10-CM | POA: Diagnosis not present

## 2020-12-31 DIAGNOSIS — C7971 Secondary malignant neoplasm of right adrenal gland: Secondary | ICD-10-CM | POA: Diagnosis not present

## 2020-12-31 DIAGNOSIS — Z79899 Other long term (current) drug therapy: Secondary | ICD-10-CM | POA: Diagnosis not present

## 2020-12-31 DIAGNOSIS — F17210 Nicotine dependence, cigarettes, uncomplicated: Secondary | ICD-10-CM | POA: Insufficient documentation

## 2020-12-31 DIAGNOSIS — Z7982 Long term (current) use of aspirin: Secondary | ICD-10-CM | POA: Insufficient documentation

## 2020-12-31 DIAGNOSIS — C7951 Secondary malignant neoplasm of bone: Secondary | ICD-10-CM | POA: Insufficient documentation

## 2020-12-31 DIAGNOSIS — Z5112 Encounter for antineoplastic immunotherapy: Secondary | ICD-10-CM | POA: Insufficient documentation

## 2020-12-31 DIAGNOSIS — Z510 Encounter for antineoplastic radiation therapy: Secondary | ICD-10-CM | POA: Diagnosis not present

## 2020-12-31 LAB — TSH: TSH: 1.014 u[IU]/mL (ref 0.320–4.118)

## 2020-12-31 LAB — CBC WITH DIFFERENTIAL (CANCER CENTER ONLY)
Abs Immature Granulocytes: 0.03 10*3/uL (ref 0.00–0.07)
Basophils Absolute: 0 10*3/uL (ref 0.0–0.1)
Basophils Relative: 0 %
Eosinophils Absolute: 0.2 10*3/uL (ref 0.0–0.5)
Eosinophils Relative: 4 %
HCT: 45.6 % (ref 39.0–52.0)
Hemoglobin: 14.1 g/dL (ref 13.0–17.0)
Immature Granulocytes: 1 %
Lymphocytes Relative: 5 %
Lymphs Abs: 0.2 10*3/uL — ABNORMAL LOW (ref 0.7–4.0)
MCH: 29.7 pg (ref 26.0–34.0)
MCHC: 30.9 g/dL (ref 30.0–36.0)
MCV: 96.2 fL (ref 80.0–100.0)
Monocytes Absolute: 0.5 10*3/uL (ref 0.1–1.0)
Monocytes Relative: 11 %
Neutro Abs: 3.6 10*3/uL (ref 1.7–7.7)
Neutrophils Relative %: 79 %
Platelet Count: 151 10*3/uL (ref 150–400)
RBC: 4.74 MIL/uL (ref 4.22–5.81)
RDW: 14 % (ref 11.5–15.5)
WBC Count: 4.5 10*3/uL (ref 4.0–10.5)
nRBC: 0 % (ref 0.0–0.2)

## 2020-12-31 LAB — CMP (CANCER CENTER ONLY)
ALT: <6 U/L (ref 0–44)
AST: 9 U/L — ABNORMAL LOW (ref 15–41)
Albumin: 3.3 g/dL — ABNORMAL LOW (ref 3.5–5.0)
Alkaline Phosphatase: 93 U/L (ref 38–126)
Anion gap: 10 (ref 5–15)
BUN: 23 mg/dL (ref 8–23)
CO2: 22 mmol/L (ref 22–32)
Calcium: 8.4 mg/dL — ABNORMAL LOW (ref 8.9–10.3)
Chloride: 107 mmol/L (ref 98–111)
Creatinine: 1.21 mg/dL (ref 0.61–1.24)
GFR, Estimated: >60 mL/min (ref >60)
Glucose, Bld: 111 mg/dL — ABNORMAL HIGH (ref 70–99)
Potassium: 4.3 mmol/L (ref 3.5–5.1)
Sodium: 139 mmol/L (ref 135–145)
Total Bilirubin: 0.4 mg/dL (ref 0.3–1.2)
Total Protein: 6.2 g/dL — ABNORMAL LOW (ref 6.5–8.1)

## 2020-12-31 MED ORDER — SODIUM CHLORIDE 0.9 % IV SOLN
100.0000 mg/m2 | Freq: Once | INTRAVENOUS | Status: AC
  Administered 2020-12-31: 200 mg via INTRAVENOUS
  Filled 2020-12-31: qty 10

## 2020-12-31 MED ORDER — SODIUM CHLORIDE 0.9 % IV SOLN
10.0000 mg | Freq: Once | INTRAVENOUS | Status: AC
  Administered 2020-12-31: 10 mg via INTRAVENOUS
  Filled 2020-12-31: qty 1
  Filled 2020-12-31: qty 10

## 2020-12-31 MED ORDER — SODIUM CHLORIDE 0.9 % IV SOLN
Freq: Once | INTRAVENOUS | Status: AC
  Filled 2020-12-31: qty 250

## 2020-12-31 MED ORDER — SODIUM CHLORIDE 0.9 % IV SOLN
1500.0000 mg | Freq: Once | INTRAVENOUS | Status: AC
  Administered 2020-12-31: 1500 mg via INTRAVENOUS
  Filled 2020-12-31: qty 30

## 2020-12-31 MED ORDER — SODIUM CHLORIDE 0.9% FLUSH
10.0000 mL | INTRAVENOUS | Status: DC | PRN
  Administered 2020-12-31: 10 mL
  Filled 2020-12-31: qty 10

## 2020-12-31 MED ORDER — PALONOSETRON HCL INJECTION 0.25 MG/5ML
0.2500 mg | Freq: Once | INTRAVENOUS | Status: AC
  Administered 2020-12-31: 0.25 mg via INTRAVENOUS

## 2020-12-31 MED ORDER — HEPARIN SOD (PORK) LOCK FLUSH 100 UNIT/ML IV SOLN
500.0000 [IU] | Freq: Once | INTRAVENOUS | Status: AC | PRN
  Administered 2020-12-31: 500 [IU]
  Filled 2020-12-31: qty 5

## 2020-12-31 MED ORDER — SODIUM CHLORIDE 0.9 % IV SOLN
150.0000 mg | Freq: Once | INTRAVENOUS | Status: AC
  Administered 2020-12-31: 150 mg via INTRAVENOUS
  Filled 2020-12-31: qty 5
  Filled 2020-12-31: qty 150

## 2020-12-31 MED ORDER — SODIUM CHLORIDE 0.9% FLUSH
10.0000 mL | Freq: Once | INTRAVENOUS | Status: AC
  Administered 2020-12-31: 10 mL
  Filled 2020-12-31: qty 10

## 2020-12-31 MED ORDER — DEXTROSE 5 % IV SOLN
240.0000 mg/m2 | Freq: Once | INTRAVENOUS | Status: AC
Start: 2020-12-31 — End: 2020-12-31
  Administered 2020-12-31: 465 mg via INTRAVENOUS
  Filled 2020-12-31: qty 31

## 2020-12-31 MED ORDER — SODIUM CHLORIDE 0.9 % IV SOLN
407.5000 mg | Freq: Once | INTRAVENOUS | Status: AC
  Administered 2020-12-31: 410 mg via INTRAVENOUS
  Filled 2020-12-31: qty 41

## 2020-12-31 MED ORDER — PALONOSETRON HCL INJECTION 0.25 MG/5ML
INTRAVENOUS | Status: AC
  Filled 2020-12-31: qty 5

## 2020-12-31 NOTE — Progress Notes (Signed)
[place] Work  Clinical Social Work received referral from medical oncology for caregiver support.  CSW contacted patients wife, [name] to offer support and assess for needs.  Patients wife stated patient had his first chemo today, and radiation.  [name] stated "it has been a long day, but we are both doing ok."  CSW provided education on CSW role and support programs at [facility].  CSW and [name] discussed the importance of support for the patient and the caregiver.  [name] expressed interest in support programs and requested to be added to the support program mailing list.  CSW will also mail patient and caregiver a packet with support information and current program offerings for the month.  [name] was appreciative of CSW contact.  CSW provided contact information and encouraged patient and/or family to call with needs or concerns.   Johnnye Lana, MSW, LCSW, OSW-C Clinical Social Worker [facility] [phone]

## 2020-12-31 NOTE — Patient Instructions (Signed)
[place] Discharge Instructions for Patients Receiving Chemotherapy  Today you received the following chemotherapy agents: Imfinzi, Carboplatin, Etoposide  To help prevent nausea and vomiting after your treatment, we encourage you to take your nausea medication as directed.   If you develop nausea and vomiting that is not controlled by your nausea medication, call the clinic.   BELOW ARE SYMPTOMS THAT SHOULD BE REPORTED IMMEDIATELY:  *FEVER GREATER THAN 100.5 F  *CHILLS WITH OR WITHOUT FEVER  NAUSEA AND VOMITING THAT IS NOT CONTROLLED WITH YOUR NAUSEA MEDICATION  *UNUSUAL SHORTNESS OF BREATH  *UNUSUAL BRUISING OR BLEEDING  TENDERNESS IN MOUTH AND THROAT WITH OR WITHOUT PRESENCE OF ULCERS  *URINARY PROBLEMS  *BOWEL PROBLEMS  UNUSUAL RASH Items with * indicate a potential emergency and should be followed up as soon as possible.  Feel free to call the clinic should you have any questions or concerns. The clinic phone number is [phone].  Please show the [place] at check-in to the Emergency Department and triage nurse.  Durvalumab injection What is this medicine? DURVALUMAB (dur VAL ue mab) is a monoclonal antibody. It is used to treat lung cancer. This medicine may be used for other purposes; ask your health care provider or pharmacist if you have questions. COMMON BRAND NAME(S): IMFINZI What should I tell my health care provider before I take this medicine? They need to know if you have any of these conditions:  autoimmune diseases like Crohn's disease, ulcerative colitis, or lupus  have had or planning to have an allogeneic stem cell transplant (uses someone else's stem cells)  history of organ transplant  history of radiation to the chest  nervous system problems like myasthenia gravis or Guillain-Barre syndrome  an unusual or allergic reaction to durvalumab, other medicines, foods, dyes, or preservatives  pregnant or trying to get  pregnant  breast-feeding How should I use this medicine? This medicine is for infusion into a vein. It is given by a health care professional in a hospital or clinic setting. A special MedGuide will be given to you before each treatment. Be sure to read this information carefully each time. Talk to your pediatrician regarding the use of this medicine in children. Special care may be needed. Overdosage: If you think you have taken too much of this medicine contact a poison control center or emergency room at once. NOTE: This medicine is only for you. Do not share this medicine with others. What if I miss a dose? It is important not to miss your dose. Call your doctor or health care professional if you are unable to keep an appointment. What may interact with this medicine? Interactions have not been studied. This list may not describe all possible interactions. Give your health care provider a list of all the medicines, herbs, non-prescription drugs, or dietary supplements you use. Also tell them if you smoke, drink alcohol, or use illegal drugs. Some items may interact with your medicine. What should I watch for while using this medicine? This drug may make you feel generally unwell. Continue your course of treatment even though you feel ill unless your doctor tells you to stop. You may need blood work done while you are taking this medicine. Do not become pregnant while taking this medicine or for 3 months after stopping it. Women should inform their doctor if they wish to become pregnant or think they might be pregnant. There is a potential for serious side effects to an unborn child. Talk to  your health care professional or pharmacist for more information. Do not breast-feed an infant while taking this medicine or for 3 months after stopping it. What side effects may I notice from receiving this medicine? Side effects that you should report to your doctor or health care professional as soon as  possible:  allergic reactions like skin rash, itching or hives, swelling of the face, lips, or tongue  black, tarry stools  bloody or watery diarrhea  breathing problems  change in emotions or moods  change in sex drive  changes in vision  chest pain or chest tightness  chills  confusion  cough  facial flushing  fever  headache  signs and symptoms of high blood sugar such as dizziness; dry mouth; dry skin; fruity breath; nausea; stomach pain; increased hunger or thirst; increased urination  signs and symptoms of liver injury like dark yellow or brown urine; general ill feeling or flu-like symptoms; light-colored stools; loss of appetite; nausea; right upper belly pain; unusually weak or tired; yellowing of the eyes or skin  stomach pain  trouble passing urine or change in the amount of urine  weight gain or weight loss Side effects that usually do not require medical attention (report these to your doctor or health care professional if they continue or are bothersome):  bone pain  constipation  loss of appetite  muscle pain  nausea  swelling of the ankles, feet, hands  tiredness This list may not describe all possible side effects. Call your doctor for medical advice about side effects. You may report side effects to FDA at 1-800-FDA-1088. Where should I keep my medicine? This drug is given in a hospital or clinic and will not be stored at home. NOTE: This sheet is a summary. It may not cover all possible information. If you have questions about this medicine, talk to your doctor, pharmacist, or health care provider.  2021 Elsevier/Gold Standard (2019-11-23 13:01:29)  Carboplatin injection What is this medicine? CARBOPLATIN (KAR boe pla tin) is a chemotherapy drug. It targets fast dividing cells, like cancer cells, and causes these cells to die. This medicine is used to treat ovarian cancer and many other cancers. This medicine may be used for other  purposes; ask your health care provider or pharmacist if you have questions. COMMON BRAND NAME(S): Paraplatin What should I tell my health care provider before I take this medicine? They need to know if you have any of these conditions:  blood disorders  hearing problems  kidney disease  recent or ongoing radiation therapy  an unusual or allergic reaction to carboplatin, cisplatin, other chemotherapy, other medicines, foods, dyes, or preservatives  pregnant or trying to get pregnant  breast-feeding How should I use this medicine? This drug is usually given as an infusion into a vein. It is administered in a hospital or clinic by a specially trained health care professional. Talk to your pediatrician regarding the use of this medicine in children. Special care may be needed. Overdosage: If you think you have taken too much of this medicine contact a poison control center or emergency room at once. NOTE: This medicine is only for you. Do not share this medicine with others. What if I miss a dose? It is important not to miss a dose. Call your doctor or health care professional if you are unable to keep an appointment. What may interact with this medicine?  medicines for seizures  medicines to increase blood counts like filgrastim, pegfilgrastim, sargramostim  some antibiotics like  amikacin, gentamicin, neomycin, streptomycin, tobramycin  vaccines Talk to your doctor or health care professional before taking any of these medicines:  acetaminophen  aspirin  ibuprofen  ketoprofen  naproxen This list may not describe all possible interactions. Give your health care provider a list of all the medicines, herbs, non-prescription drugs, or dietary supplements you use. Also tell them if you smoke, drink alcohol, or use illegal drugs. Some items may interact with your medicine. What should I watch for while using this medicine? Your condition will be monitored carefully while you  are receiving this medicine. You will need important blood work done while you are taking this medicine. This drug may make you feel generally unwell. This is not uncommon, as chemotherapy can affect healthy cells as well as cancer cells. Report any side effects. Continue your course of treatment even though you feel ill unless your doctor tells you to stop. In some cases, you may be given additional medicines to help with side effects. Follow all directions for their use. Call your doctor or health care professional for advice if you get a fever, chills or sore throat, or other symptoms of a cold or flu. Do not treat yourself. This drug decreases your body's ability to fight infections. Try to avoid being around people who are sick. This medicine may increase your risk to bruise or bleed. Call your doctor or health care professional if you notice any unusual bleeding. Be careful brushing and flossing your teeth or using a toothpick because you may get an infection or bleed more easily. If you have any dental work done, tell your dentist you are receiving this medicine. Avoid taking products that contain aspirin, acetaminophen, ibuprofen, naproxen, or ketoprofen unless instructed by your doctor. These medicines may hide a fever. Do not become pregnant while taking this medicine. Women should inform their doctor if they wish to become pregnant or think they might be pregnant. There is a potential for serious side effects to an unborn child. Talk to your health care professional or pharmacist for more information. Do not breast-feed an infant while taking this medicine. What side effects may I notice from receiving this medicine? Side effects that you should report to your doctor or health care professional as soon as possible:  allergic reactions like skin rash, itching or hives, swelling of the face, lips, or tongue  signs of infection - fever or chills, cough, sore throat, pain or difficulty passing  urine  signs of decreased platelets or bleeding - bruising, pinpoint red spots on the skin, black, tarry stools, nosebleeds  signs of decreased red blood cells - unusually weak or tired, fainting spells, lightheadedness  breathing problems  changes in hearing  changes in vision  chest pain  high blood pressure  low blood counts - This drug may decrease the number of white blood cells, red blood cells and platelets. You may be at increased risk for infections and bleeding.  nausea and vomiting  pain, swelling, redness or irritation at the injection site  pain, tingling, numbness in the hands or feet  problems with balance, talking, walking  trouble passing urine or change in the amount of urine Side effects that usually do not require medical attention (report to your doctor or health care professional if they continue or are bothersome):  hair loss  loss of appetite  metallic taste in the mouth or changes in taste This list may not describe all possible side effects. Call your doctor for medical advice about  side effects. You may report side effects to FDA at 1-800-FDA-1088. Where should I keep my medicine? This drug is given in a hospital or clinic and will not be stored at home. NOTE: This sheet is a summary. It may not cover all possible information. If you have questions about this medicine, talk to your doctor, pharmacist, or health care provider.  2021 Elsevier/Gold Standard (2007-12-20 14:38:05)  Etoposide, VP-16 injection What is this medicine? ETOPOSIDE, VP-16 (e toe POE side) is a chemotherapy drug. It is used to treat testicular cancer, lung cancer, and other cancers. This medicine may be used for other purposes; ask your health care provider or pharmacist if you have questions. COMMON BRAND NAME(S): Etopophos, Toposar, VePesid What should I tell my health care provider before I take this medicine? They need to know if you have any of these  conditions:  infection  kidney disease  liver disease  low blood counts, like low white cell, platelet, or red cell counts  an unusual or allergic reaction to etoposide, other medicines, foods, dyes, or preservatives  pregnant or trying to get pregnant  breast-feeding How should I use this medicine? This medicine is for infusion into a vein. It is administered in a hospital or clinic by a specially trained health care professional. Talk to your pediatrician regarding the use of this medicine in children. Special care may be needed. Overdosage: If you think you have taken too much of this medicine contact a poison control center or emergency room at once. NOTE: This medicine is only for you. Do not share this medicine with others. What if I miss a dose? It is important not to miss your dose. Call your doctor or health care professional if you are unable to keep an appointment. What may interact with this medicine? This medicine may interact with the following medications:  warfarin This list may not describe all possible interactions. Give your health care provider a list of all the medicines, herbs, non-prescription drugs, or dietary supplements you use. Also tell them if you smoke, drink alcohol, or use illegal drugs. Some items may interact with your medicine. What should I watch for while using this medicine? Visit your doctor for checks on your progress. This drug may make you feel generally unwell. This is not uncommon, as chemotherapy can affect healthy cells as well as cancer cells. Report any side effects. Continue your course of treatment even though you feel ill unless your doctor tells you to stop. In some cases, you may be given additional medicines to help with side effects. Follow all directions for their use. Call your doctor or health care professional for advice if you get a fever, chills or sore throat, or other symptoms of a cold or flu. Do not treat yourself. This  drug decreases your body's ability to fight infections. Try to avoid being around people who are sick. This medicine may increase your risk to bruise or bleed. Call your doctor or health care professional if you notice any unusual bleeding. Talk to your doctor about your risk of cancer. You may be more at risk for certain types of cancers if you take this medicine. Do not become pregnant while taking this medicine or for at least 6 months after stopping it. Women should inform their doctor if they wish to become pregnant or think they might be pregnant. Women of child-bearing potential will need to have a negative pregnancy test before starting this medicine. There is a potential for serious side  effects to an unborn child. Talk to your health care professional or pharmacist for more information. Do not breast-feed an infant while taking this medicine. Men must use a latex condom during sexual contact with a woman while taking this medicine and for at least 4 months after stopping it. A latex condom is needed even if you have had a vasectomy. Contact your doctor right away if your partner becomes pregnant. Do not donate sperm while taking this medicine and for at least 4 months after you stop taking this medicine. Men should inform their doctors if they wish to father a child. This medicine may lower sperm counts. What side effects may I notice from receiving this medicine? Side effects that you should report to your doctor or health care professional as soon as possible:  allergic reactions like skin rash, itching or hives, swelling of the face, lips, or tongue  low blood counts - this medicine may decrease the number of white blood cells, red blood cells, and platelets. You may be at increased risk for infections and bleeding  nausea, vomiting  redness, blistering, peeling or loosening of the skin, including inside the mouth  signs and symptoms of infection like fever; chills; cough; sore throat;  pain or trouble passing urine  signs and symptoms of low red blood cells or anemia such as unusually weak or tired; feeling faint or lightheaded; falls; breathing problems  unusual bruising or bleeding Side effects that usually do not require medical attention (report to your doctor or health care professional if they continue or are bothersome):  changes in taste  diarrhea  hair loss  loss of appetite  mouth sores This list may not describe all possible side effects. Call your doctor for medical advice about side effects. You may report side effects to FDA at 1-800-FDA-1088. Where should I keep my medicine? This drug is given in a hospital or clinic and will not be stored at home. NOTE: This sheet is a summary. It may not cover all possible information. If you have questions about this medicine, talk to your doctor, pharmacist, or health care provider.  2021 Elsevier/Gold Standard (2018-11-09 16:57:15)

## 2021-01-01 ENCOUNTER — Ambulatory Visit
Admission: RE | Admit: 2021-01-01 | Discharge: 2021-01-01 | Disposition: A | Discharge status: Home / Self Care | Payer: PPO | Source: Ambulatory Visit | Attending: Radiation Oncology | Admitting: Radiation Oncology

## 2021-01-01 ENCOUNTER — Other Ambulatory Visit: Payer: Self-pay

## 2021-01-01 ENCOUNTER — Inpatient Hospital Stay: Payer: PPO

## 2021-01-01 VITALS — BP 128/62 | HR 62 | Temp 98.2°F | Resp 18

## 2021-01-01 DIAGNOSIS — Z510 Encounter for antineoplastic radiation therapy: Secondary | ICD-10-CM | POA: Diagnosis not present

## 2021-01-01 DIAGNOSIS — C3491 Malignant neoplasm of unspecified part of right bronchus or lung: Secondary | ICD-10-CM

## 2021-01-01 DIAGNOSIS — Z5112 Encounter for antineoplastic immunotherapy: Secondary | ICD-10-CM | POA: Diagnosis not present

## 2021-01-01 MED ORDER — SODIUM CHLORIDE 0.9 % IV SOLN
10.0000 mg | Freq: Once | INTRAVENOUS | Status: AC
  Administered 2021-01-01: 10 mg via INTRAVENOUS
  Filled 2021-01-01: qty 10

## 2021-01-01 MED ORDER — SODIUM CHLORIDE 0.9 % IV SOLN
100.0000 mg/m2 | Freq: Once | INTRAVENOUS | Status: AC
  Administered 2021-01-01: 200 mg via INTRAVENOUS
  Filled 2021-01-01: qty 10

## 2021-01-01 MED ORDER — TRILACICLIB DIHYDROCHLORIDE INJECTION 300 MG
240.0000 mg/m2 | Freq: Once | INTRAVENOUS | Status: AC
Start: 2021-01-01 — End: 2021-01-01
  Administered 2021-01-01: 465 mg via INTRAVENOUS
  Filled 2021-01-01: qty 31

## 2021-01-01 MED ORDER — SODIUM CHLORIDE 0.9 % IV SOLN
Freq: Once | INTRAVENOUS | Status: AC
  Filled 2021-01-01: qty 250

## 2021-01-01 NOTE — Progress Notes (Signed)
Gregory Hodges OFFICE PROGRESS NOTE  [name], MD [address] [place] [address]  DIAGNOSIS:  Extensive stage (T2 a, N2, M1 C) small cell lung cancer presented with right hilar mass in addition to mediastinal lymphadenopathy as well as metastatic disease to the bone with complete replacement of the L5 status post laminectomy with resection of epidural tumor as well as metastatic disease to the brain, bones and bilateral adrenal glands diagnosed in January 2022.   PRIOR THERAPY: Status post palliative radiotherapy to the resected area at L5 under the care of Dr. Lisbeth Renshaw.  CURRENT THERAPY:  1)  Systemic chemotherapy with carboplatin for AUC of 5 from day 1, etoposide 100 mg/M2 on days 1, 2 and 3 with Cosela 240 mg/M2 before chemotherapy in addition to Imfinzi 1500 mg IV every 3 weeks with day one of the chemotherapy.  First dose April 4th, 2022. 2) palliative radiotherapy to the right hip under the care of Dr. Lisbeth Renshaw last treatment expected on 01/16/2021.  INTERVAL HISTORY: Gregory Hodges 79 y.o. male returns to the clinic today for a follow-up visit accompanied by his wife.  The patient was recently found to have extensive stage small cell lung cancer.  He already completed palliative radiotherapy to the area of the resected L5 epidural tumor.  He is currently undergoing palliative radiotherapy to the right hip lesion. He notes significant improvement in his pain. He states that he is not in pain at all today.  The patient underwent his first cycle of palliative chemotherapy last week.  The plan is to complete at least 2 cycles before undergoing whole brain radiation for the multiple metastatic brain lesions.  The patient is currently asymptomatic.  The patient tolerated his first cycle of treatment very well except for thrombocytopenia. He denies unusual bleeding. 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 He denies nose bleeding, gum bleeding,  hematemesis, hemoptysis, melena, or hematuria. He takes 81 mg aspirin. In the interval since his last appointment, he had a port a cath placed. He denies fevers, chills, night sweats or significant weight loss. He denies significant shortness of breath with exertion, chest pain, or hemoptysis. He reports his baseline "smokers cough" which is unchanged. Denies nausea, vomiting, or diarrhea. He had mild constipation for which he took milk of magnesia which helped his symptoms. He denies rashes or skin changes. He denies headaches or visual changes. He is here for evaluation and repeat lab work and a 1 week follow up visit to manage any adverse side effects of treatment.   MEDICAL HISTORY: Past Medical History:  Diagnosis Date  . 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 Cancer (HCC)    Skin  . Crohn's disease ([facility])   . E coli bacteremia   . History of kidney stones   . Hyperlipemia   . Hypertension    "denies"  . Myocardial infarction ([place]) 08/11/2005   s/p Horizon study stent D1  . Peripheral vascular disease [facility]) June 2011  . 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  MEDICATIONS:  Current Outpatient Medications  Medication Sig Dispense Refill  . acetaminophen (TYLENOL) 500 MG tablet Take 500 mg by mouth every 6 (six) hours as needed for moderate pain.    Marland Kitchen  aspirin 81 MG tablet Take 81 mg by mouth daily.    Marland Kitchen atenolol (TENORMIN) 25 MG tablet Take 25 mg by mouth 2 (two) times daily.    . calcium carbonate (TUMS - DOSED IN MG ELEMENTAL CALCIUM) 500 MG chewable tablet Chew 2 tablets by mouth daily as needed for indigestion or heartburn.    . diphenhydrAMINE (BENADRYL) 25 MG tablet Take  50 mg by mouth daily as needed for allergies.    . ferrous sulfate 325 (65 FE) MG tablet Take 325 mg by mouth every Monday, Wednesday, and Friday.    . lidocaine-prilocaine (EMLA) cream Apply to the Port-A-Cath site 30-60 minutes before treatment 30 g 0  . nitroGLYCERIN (NITROSTAT) 0.4 MG SL tablet Place 0.4 mg under the tongue every 5 (five) minutes as needed for chest pain.    Marland Kitchen omeprazole (PRILOSEC) 10 MG capsule Take 10 mg by mouth daily.    Marland Kitchen oxymetazoline (AFRIN) 0.05 % nasal spray Place 1 spray into both nostrils 2 (two) times daily as needed for congestion.    . prochlorperazine (COMPAZINE) 10 MG tablet Take 1 tablet (10 mg total) by mouth every 6 (six) hours as needed for nausea or vomiting. 30 tablet 0  . rosuvastatin (CRESTOR) 10 MG tablet Take 10 mg by mouth daily.    Marland Kitchen sulfamethoxazole-trimethoprim (BACTRIM DS) 800-160 MG per tablet Take 1 tablet by mouth 2 (two) times daily. 800-160 mg 60 tablet 11   No current facility-administered medications for this visit.    SURGICAL HISTORY:  Past Surgical History:  Procedure Laterality Date  . ABDOMINAL AORTIC ANEURYSM REPAIR  03/03/2010  . AXILLARY-FEMORAL BYPASS GRAFT  07/22/2010   bilateral  . bowel     herniated bowel  . CARDIAC CATHETERIZATION    . CORONARY STENT PLACEMENT    . CYSTOSCOPY    . IR IMAGING GUIDED PORT INSERTION  12/27/2020  . LAMINECTOMY N/A 10/22/2020   Procedure: Lumbar five Open Laminectomy for tumor resection;  Surgeon: Judith Part, MD;  Location: [facility];  Service: Neurosurgery;  Laterality: N/A;  . MOHS SURGERY     several     REVIEW OF SYSTEMS:   Review of Systems  Constitutional: Negative for appetite change, chills, fatigue, fever and unexpected weight change.  HENT: Negative for mouth sores, nosebleeds, sore throat and trouble swallowing.   Eyes: Negative for eye problems and icterus.  Respiratory: Positive for baseline cough. Negative for hemoptysis, shortness of breath and wheezing.    Cardiovascular: Negative for chest pain and leg swelling.  Gastrointestinal: Negative for abdominal pain, constipation (resolved), diarrhea, nausea and vomiting.  Genitourinary: Negative for bladder incontinence, difficulty urinating, dysuria, frequency and hematuria.   Musculoskeletal: Negative for back pain, gait problem, neck pain and neck stiffness.  Skin: Negative for itching and rash.  Neurological: Negative for dizziness, extremity weakness, gait problem, headaches, light-headedness and seizures.  Hematological: Negative for adenopathy. Does not bruise/bleed easily.  Psychiatric/Behavioral: Negative for confusion, depression and sleep disturbance. The patient is not nervous/anxious.     PHYSICAL EXAMINATION:  There were no vitals taken for this visit.  ECOG PERFORMANCE STATUS: 1 - Symptomatic but completely ambulatory  Physical Exam  Constitutional: Oriented to person, place, and time and thin appearing male and in no distress.   HENT:  Head: Normocephalic and atraumatic.  Mouth/Throat: Oropharynx is clear and moist. No oropharyngeal exudate.  Eyes: Conjunctivae are normal. Right eye exhibits no discharge. Left eye exhibits no discharge. No scleral icterus.  Neck: Normal range of  motion. Neck supple.  Cardiovascular: Normal rate, regular rhythm, normal heart sounds and intact distal pulses.   Pulmonary/Chest: Effort normal. Quiet breath sounds in all lung fields. No respiratory distress. No wheezes. No rales.  Abdominal: Soft. Bowel sounds are normal. Exhibits no distension and no mass. There is no tenderness.  Musculoskeletal: Normal range of motion. Exhibits no edema.  Lymphadenopathy:    No cervical adenopathy.  Neurological: Alert and oriented to person, place, and time. Exhibits normal muscle tone. Gait normal. Coordination normal.  Skin: Senile purpura on upper extremities.  Skin is warm and dry. No rash noted. Not diaphoretic. No erythema. No pallor.  Psychiatric: Mood,  memory and judgment normal.  Vitals reviewed.  LABORATORY DATA: Lab Results  Component Value Date   WBC 4.5 12/31/2020   HGB 14.1 12/31/2020   HCT 45.6 12/31/2020   MCV 96.2 12/31/2020   PLT 151 12/31/2020      Chemistry      Component Value Date/Time   NA 139 12/31/2020 0752   K 4.3 12/31/2020 0752   CL 107 12/31/2020 0752   CO2 22 12/31/2020 0752   BUN 23 12/31/2020 0752   CREATININE 1.21 12/31/2020 0752      Component Value Date/Time   CALCIUM 8.4 (L) 12/31/2020 0752   ALKPHOS 93 12/31/2020 0752   AST 9 (L) 12/31/2020 0752   ALT <6 12/31/2020 0752   BILITOT 0.4 12/31/2020 0752       RADIOGRAPHIC STUDIES:  MR Brain W Wo Contrast  Result Date: 12/17/2020 CLINICAL DATA:  80 year old male with non-small cell lung cancer diagnosed in January. Lumbar metastatic disease. Brain staging. EXAM: MRI HEAD WITHOUT AND WITH CONTRAST TECHNIQUE: Multiplanar, multiecho pulse sequences of the brain and surrounding structures were obtained without and with intravenous contrast. CONTRAST:  55m GADAVIST GADOBUTROL 1 MMOL/ML IV SOLN COMPARISON:  No prior brain imaging. FINDINGS: Brain: There are several enhancing brain lesions compatible with metastatic disease, although several of these also demonstrate lesser intrinsic T1 signal which is likely a component of hemorrhage (series 12, image 31 the bilateral cerebellum which does have susceptibility on SWI). In total 6 enhancing lesions are identified ranging from 5-6 mm to the largest which are 13 mm in the right inferior cerebellum (series 16, image 30) and left posterosuperior temporal lobe (image 84). Three lesions are in the cerebellum. All lesions are annotated on series 16 and 20. Virtually no associated vasogenic edema. Minimal T2 and FLAIR hyperintensity at each lesion site. No intracranial mass effect or midline shift. No superimposed restricted diffusion to suggest acute infarction. No ventriculomegaly, extra-axial collection or acute  intracranial hemorrhage. Cervicomedullary junction and pituitary are within normal limits. Patchy and confluent bilateral white matter T2 and FLAIR hyperintensity in both hemispheres is nonspecific, along with similar patchy signal hyperintensity in the pons. No cortical encephalomalacia identified. No dural thickening. Vascular: Major intracranial vascular flow voids are preserved. Generalized intracranial artery tortuosity. The major dural venous sinuses are enhancing and appear to be patent. Skull and upper cervical spine: C3-C4 disc degeneration with small disc protrusion and degenerative spinal stenosis on series 7, image 13. Otherwise negative visible cervical spine and spinal cord. Visible bone marrow signal is within normal limits. Sinuses/Orbits: Negative orbits. Trace paranasal sinus mucosal thickening. Other: Mastoids are clear. Visible internal auditory structures appear normal. Visible scalp and face appear negative. IMPRESSION: 1. 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 Several show some internal hemorrhage, and 3 lesions are in the cerebellum. But  there is no significant brain edema or mass effect at this time. 2. 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 3. 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 Electronically Signed   By: Genevie Ann M.D.   On: 12/17/2020 09:29   NM PET Image Initial (PI) Skull Base To Thigh  Result Date: 12/19/2020 CLINICAL DATA:  Initial treatment strategy for small cell lung cancer staging. EXAM: NUCLEAR MEDICINE PET SKULL BASE TO THIGH TECHNIQUE: 8.6 mCi F-18 FDG was injected intravenously. Full-ring PET imaging was performed from the skull base to thigh after the radiotracer. CT data was obtained and used for attenuation correction and anatomic localization. Fasting blood glucose: 85 mg/dl COMPARISON:  October 16, 2020 FINDINGS: Mediastinal blood pool activity: SUV max 1.91 Liver activity:  SUV max NA NECK: No hypermetabolic lymph nodes in the neck. Incidental CT findings: none CHEST: RIGHT mediastinal and RIGHT hilar adenopathy, confluent and hypermetabolic. Maximum SUV about the RIGHT paratracheal chain 13.1 (image 70, series 4) at the site of 2.1 cm RIGHT paratracheal lymph node with other small nodes in this location, indistinguishable from this node on the FDG portion of the exam. Fullness of the RIGHT hilum with marked hypermetabolic activity on image 77 of series 4 maximum SUV 14.8. Lymph nodes measuring up to 2 cm short axis, better defined on the recent CT of the chest from January. Soft tissue surrounds bronchial and vascular structures difficult separate due to lack of contrast peer small RIGHT pleural effusion with adjacent hypermetabolic changes in RIGHT posterior ribs, see below. Small RIGHT pulmonary nodule measuring 14 mm in approximate dimension, limited measurement by respiratory motion (image 42, series 8) previously 9 mm also with intense metabolic activity that blends with hilar activity seen on today's evaluation. Incidental CT findings: Aortic atherosclerosis. No aneurysmal dilation. Three-vessel coronary artery disease. No pericardial effusion with mild cardiac enlargement. Central pulmonary vasculature is normal caliber. Signs of bilateral axillofemoral bypass. Large hiatal hernia extends into the chest as on the recent CT evaluation. Signs of pulmonary emphysema and interval development of a small RIGHT-sided pleural effusion. ABDOMEN/PELVIS: Bilateral hypermetabolic adrenal masses. LEFT adrenal measuring 3.6 x 1.8 cm within 1-2 mm of previous measurement with a maximum SUV of 14.4. RIGHT adrenal also hypermetabolic with a maximum SUV of 10, lesion measuring approximately 3.2 x 1.7 cm. Previously this area measured approximately 2.9 x 1.6 cm. No retroperitoneal adenopathy but with signs of pelvic lymphadenopathy in the bilateral external iliac chains. Hypermetabolic lymph node  on image 171 of series 4 in the LEFT external iliac chain measuring 11 mm with a maximum SUV of 8. Hypermetabolic lymph node on image 172 of series 4 with a maximum SUV of 9.8 measuring 11 mm. Smaller lymph node just above this measuring 8-9 mm with similar metabolic activity. Incidental CT findings: Mildly lobular contour of the liver with subtle low-density foci without corresponding FDG uptake, likely small cysts. Gallbladder without pericholecystic stranding. Pancreas with normal appearance. Spleen, kidneys, stomach, small and large bowel without acute process. Large ventral abdominal wall hernia, rectus diastasis approximately 10 cm. Sigmoid diverticular disease and diverticular changes scattered about the remainder of the colon. Normal appendix. LEFT renal cysts. Nephrolithiasis in the bilateral kidneys similar to recent imaging. Vascular disease with resection of the infrarenal abdominal aorta and axillobifemoral bypass grafting. SKELETON: RIGHT humeral metastasis on image 28 of series 4 with a maximum SUV of 8.8, no corresponding destruction but with subtle added density in the medullary space  of the RIGHT proximal humerus at this level. Rib destruction of the LEFT fourth rib. Soft tissue thickening on image 70 of series 4 measuring approximately 2.3 cm greatest thickness but with diffuse involvement of the rib extending anteriorly along the LEFT anterolateral chest and a maximum SUV of 11.3. RIGHT posterior seventh rib with pathologic fracture and diffuse, similar involvement with a maximum SUV of 9.7 showing permeative destructive changes in both of these areas Diffuse acetabular, lower iliac and RIGHT pubic bone involvement on images 177-192 showing mottled permeative destructive changes that are subtle and extensive FDG uptake, maximum SUV of approximately 11 Incidental CT findings: L5 with sclerosis and fracture which is age indeterminate but present on the previous study showing little FDG uptake,  maximum SUV of 2.9. Spinal degenerative changes. IMPRESSION: Small RIGHT lung lesion with extensive mediastinal and RIGHT hilar adenopathy and associated bony in visceral metastatic disease in this patient with small cell lung cancer. Presumed pathologic fracture of the L4 vertebral body but with less FDG uptake than other areas involving ribs, RIGHT proximal humerus and RIGHT pelvis. Based on the diffuse activity of the RIGHT pelvis this patient would be a risk for pathologic fracture in this area. Pathologic fracture of RIGHT-sided ribs, now segmental involving RIGHT posterior seventh rib, associated with small RIGHT-sided effusion. Enlarging RIGHT adrenal metastasis and potential enlargement of the primary lesion in short interval. Signs of vascular disease and coronary artery disease with axillobifemoral bypass grafting. These results will be called to the ordering clinician or representative by the Radiologist Assistant, and communication documented in the PACS or [employer]. Aortic Atherosclerosis (ICD10-I70.0) and Emphysema (ICD10-J43.9). Electronically Signed   By: Zetta Bills M.D.   On: 12/19/2020 14:18   IR IMAGING GUIDED PORT INSERTION  Result Date: 12/27/2020 INDICATION: Metastatic small cell lung cancer EXAM: IMPLANTED PORT A CATH PLACEMENT WITH ULTRASOUND AND FLUOROSCOPIC GUIDANCE MEDICATIONS: None ANESTHESIA/SEDATION: Moderate (conscious) sedation was employed during this procedure. A total of Versed to mg and Fentanyl 100 mcg was administered intravenously. Moderate Sedation Time: 21 minutes. The patient's level of consciousness and vital signs were monitored continuously by radiology nursing throughout the procedure under my direct supervision. FLUOROSCOPY TIME:  One minutes, 0 seconds (9 mGy) COMPLICATIONS: None immediate. PROCEDURE: The procedure, risks, benefits, and alternatives were explained to the patient. Questions regarding the procedure were encouraged and answered. The patient  understands and consents to the procedure. A timeout was performed prior to the initiation of the procedure. Patient positioned supine on the angiography table. Right neck and anterior upper chest prepped and draped in the usual sterile fashion. All elements of maximal sterile barrier were utilized including, cap, mask, sterile gown, sterile gloves, large sterile drape, hand scrubbing and 2% Chlorhexidine for skin cleaning. The right internal jugular vein was evaluated with ultrasound and shown to be patent. A permanent ultrasound image was obtained and placed in the patient's medical record. Local anesthesia was provided with 1% lidocaine with epinephrine. Using sterile gel and a sterile probe cover, the right internal jugular vein was entered with a 21 ga needle during real time ultrasound guidance. 0.018 inch guidewire placed and 21 ga needle exchanged for transitional dilator set. Utilizing fluoroscopy, 0.035 inch guidewire advanced through the needle without difficulty. Attention then turned to the right anterior upper chest. Following local lidocaine administration, a port pocket was created. The catheter was connected to the port and brought from the pocket to the venotomy site through a subcutaneous tunnel. The catheter was cut to size  and inserted through the peel-away sheath. The catheter tip was positioned at the cavoatrial junction using fluoroscopic guidance. The port aspirated and flushed well. The port pocket was closed with deep and superficial absorbable suture. The port pocket incision and venotomy sites were also sealed with Dermabond. IMPRESSION: Successful placement of a right internal jugular approach power injectable Port-A-Cath. The catheter is ready for immediate use. Electronically Signed   By: Miachel Roux M.D.   On: 12/27/2020 15:44     ASSESSMENT/PLAN:  This is a very pleasant 80 year old Caucasian male diagnosed with extensive stage (T2a, N2, M1c) small cell lung cancer.  He  presented with a right upper lobe lung lesion with extensive mediastinal and right hilar lymphadenopathy.  He also has extensive bone metastases as well as metastatic disease to the adrenal gland bilaterally and multiple brain lesions.  He was diagnosed in February 2022.    He is status post resection of the epidural tumor at L5.  He also completed palliative radiotherapy to this lesion after resection.  The patient is also undergoing palliative radiotherapy to the painful right hip lesion.  The patient was found to have metastatic disease to the brain.  Per Dr. Earlie Server, the plan is to undergo 2 cycles of palliative systemic chemotherapy before proceeding with whole brain radiation.  The patient is currently undergoing palliative systemic chemotherapy with carboplatin for an AUC of 5, etoposide 100 mg per metered squared on days 1, 2, and 3 and immunotherapy with Imfinzi 1500 mg IV every 3 weeks with Cosela.  He is status post his first cycle and tolerated it well without any notice able adverse side effects.    Labs reviewed.  Recommend that he continue on observation. His ANC is 2.2 today. His total WBC 2.4 today. He denies signs and symptoms of infection. His platelet count is 49k. I reviewed with Dr. Julien Nordmann. Dr. Julien Nordmann noted that since his platelet count is ~50k, he does not need to hold his 81 mg aspirin at this time. He denies any abnormal bleeding. He continues to have his normal baseline senile purpura. Of course, if the patient has any significant or unusual bleeding in the interval, he was instructed to call us or go to the ER.   We will see him back for follow-up visit in 2 weeks for evaluation before starting cycle #2.   The patient had several questions about his condition and prognosis which I reviewed with him. Discussed his condition is not curable but is treatable.   The patient was advised to call immediately if he has any concerning symptoms in the interval. The patient voices  understanding of current disease status and treatment options and is in agreement with the current care plan. All questions were answered. The patient knows to call the clinic with any problems, questions or concerns. We can certainly see the patient much sooner if necessary     No orders of the defined types were placed in this encounter.    I spent 20-29 minutes in this encounter.   Gregory Alvis L Gerik Coberly, PA-C 01/01/21

## 2021-01-01 NOTE — Patient Instructions (Signed)
[facility] Discharge Instructions for Patients Receiving Chemotherapy  Today you received the following chemotherapy agents: Etoposide  To help prevent nausea and vomiting after your treatment, we encourage you to take your nausea medication as directed.   If you develop nausea and vomiting that is not controlled by your nausea medication, call the clinic.   BELOW ARE SYMPTOMS THAT SHOULD BE REPORTED IMMEDIATELY:  *FEVER GREATER THAN 100.5 F  *CHILLS WITH OR WITHOUT FEVER  NAUSEA AND VOMITING THAT IS NOT CONTROLLED WITH YOUR NAUSEA MEDICATION  *UNUSUAL SHORTNESS OF BREATH  *UNUSUAL BRUISING OR BLEEDING  TENDERNESS IN MOUTH AND THROAT WITH OR WITHOUT PRESENCE OF ULCERS  *URINARY PROBLEMS  *BOWEL PROBLEMS  UNUSUAL RASH Items with * indicate a potential emergency and should be followed up as soon as possible.  Feel free to call the clinic should you have any questions or concerns. The clinic phone number is [phone].  Please show the [facility] at check-in to the Emergency Department and triage nurse.  Durvalumab injection What is this medicine? DURVALUMAB (dur VAL ue mab) is a monoclonal antibody. It is used to treat lung cancer. This medicine may be used for other purposes; ask your health care provider or pharmacist if you have questions. COMMON BRAND NAME(S): IMFINZI What should I tell my health care provider before I take this medicine? They need to know if you have any of these conditions:  autoimmune diseases like Crohn's disease, ulcerative colitis, or lupus  have had or planning to have an allogeneic stem cell transplant (uses someone else's stem cells)  history of organ transplant  history of radiation to the chest  nervous system problems like myasthenia gravis or Guillain-Barre syndrome  an unusual or allergic reaction to durvalumab, other medicines, foods, dyes, or preservatives  pregnant or trying to get  pregnant  breast-feeding How should I use this medicine? This medicine is for infusion into a vein. It is given by a health care professional in a hospital or clinic setting. A special MedGuide will be given to you before each treatment. Be sure to read this information carefully each time. Talk to your pediatrician regarding the use of this medicine in children. Special care may be needed. Overdosage: If you think you have taken too much of this medicine contact a poison control center or emergency room at once. NOTE: This medicine is only for you. Do not share this medicine with others. What if I miss a dose? It is important not to miss your dose. Call your doctor or health care professional if you are unable to keep an appointment. What may interact with this medicine? Interactions have not been studied. This list may not describe all possible interactions. Give your health care provider a list of all the medicines, herbs, non-prescription drugs, or dietary supplements you use. Also tell them if you smoke, drink alcohol, or use illegal drugs. Some items may interact with your medicine. What should I watch for while using this medicine? This drug may make you feel generally unwell. Continue your course of treatment even though you feel ill unless your doctor tells you to stop. You may need blood work done while you are taking this medicine. Do not become pregnant while taking this medicine or for 3 months after stopping it. Women should inform their doctor if they wish to become pregnant or think they might be pregnant. There is a potential for serious side effects to an unborn child. Talk to your health  care professional or pharmacist for more information. Do not breast-feed an infant while taking this medicine or for 3 months after stopping it. What side effects may I notice from receiving this medicine? Side effects that you should report to your doctor or health care professional as soon as  possible:  allergic reactions like skin rash, itching or hives, swelling of the face, lips, or tongue  black, tarry stools  bloody or watery diarrhea  breathing problems  change in emotions or moods  change in sex drive  changes in vision  chest pain or chest tightness  chills  confusion  cough  facial flushing  fever  headache  signs and symptoms of high blood sugar such as dizziness; dry mouth; dry skin; fruity breath; nausea; stomach pain; increased hunger or thirst; increased urination  signs and symptoms of liver injury like dark yellow or brown urine; general ill feeling or flu-like symptoms; light-colored stools; loss of appetite; nausea; right upper belly pain; unusually weak or tired; yellowing of the eyes or skin  stomach pain  trouble passing urine or change in the amount of urine  weight gain or weight loss Side effects that usually do not require medical attention (report these to your doctor or health care professional if they continue or are bothersome):  bone pain  constipation  loss of appetite  muscle pain  nausea  swelling of the ankles, feet, hands  tiredness This list may not describe all possible side effects. Call your doctor for medical advice about side effects. You may report side effects to FDA at 1-800-FDA-1088. Where should I keep my medicine? This drug is given in a hospital or clinic and will not be stored at home. NOTE: This sheet is a summary. It may not cover all possible information. If you have questions about this medicine, talk to your doctor, pharmacist, or health care provider.  2021 Elsevier/Gold Standard (2019-11-23 13:01:29)  Carboplatin injection What is this medicine? CARBOPLATIN (KAR boe pla tin) is a chemotherapy drug. It targets fast dividing cells, like cancer cells, and causes these cells to die. This medicine is used to treat ovarian cancer and many other cancers. This medicine may be used for other  purposes; ask your health care provider or pharmacist if you have questions. COMMON BRAND NAME(S): Paraplatin What should I tell my health care provider before I take this medicine? They need to know if you have any of these conditions:  blood disorders  hearing problems  kidney disease  recent or ongoing radiation therapy  an unusual or allergic reaction to carboplatin, cisplatin, other chemotherapy, other medicines, foods, dyes, or preservatives  pregnant or trying to get pregnant  breast-feeding How should I use this medicine? This drug is usually given as an infusion into a vein. It is administered in a hospital or clinic by a specially trained health care professional. Talk to your pediatrician regarding the use of this medicine in children. Special care may be needed. Overdosage: If you think you have taken too much of this medicine contact a poison control center or emergency room at once. NOTE: This medicine is only for you. Do not share this medicine with others. What if I miss a dose? It is important not to miss a dose. Call your doctor or health care professional if you are unable to keep an appointment. What may interact with this medicine?  medicines for seizures  medicines to increase blood counts like filgrastim, pegfilgrastim, sargramostim  some antibiotics like amikacin, gentamicin,  neomycin, streptomycin, tobramycin  vaccines Talk to your doctor or health care professional before taking any of these medicines:  acetaminophen  aspirin  ibuprofen  ketoprofen  naproxen This list may not describe all possible interactions. Give your health care provider a list of all the medicines, herbs, non-prescription drugs, or dietary supplements you use. Also tell them if you smoke, drink alcohol, or use illegal drugs. Some items may interact with your medicine. What should I watch for while using this medicine? Your condition will be monitored carefully while you  are receiving this medicine. You will need important blood work done while you are taking this medicine. This drug may make you feel generally unwell. This is not uncommon, as chemotherapy can affect healthy cells as well as cancer cells. Report any side effects. Continue your course of treatment even though you feel ill unless your doctor tells you to stop. In some cases, you may be given additional medicines to help with side effects. Follow all directions for their use. Call your doctor or health care professional for advice if you get a fever, chills or sore throat, or other symptoms of a cold or flu. Do not treat yourself. This drug decreases your body's ability to fight infections. Try to avoid being around people who are sick. This medicine may increase your risk to bruise or bleed. Call your doctor or health care professional if you notice any unusual bleeding. Be careful brushing and flossing your teeth or using a toothpick because you may get an infection or bleed more easily. If you have any dental work done, tell your dentist you are receiving this medicine. Avoid taking products that contain aspirin, acetaminophen, ibuprofen, naproxen, or ketoprofen unless instructed by your doctor. These medicines may hide a fever. Do not become pregnant while taking this medicine. Women should inform their doctor if they wish to become pregnant or think they might be pregnant. There is a potential for serious side effects to an unborn child. Talk to your health care professional or pharmacist for more information. Do not breast-feed an infant while taking this medicine. What side effects may I notice from receiving this medicine? Side effects that you should report to your doctor or health care professional as soon as possible:  allergic reactions like skin rash, itching or hives, swelling of the face, lips, or tongue  signs of infection - fever or chills, cough, sore throat, pain or difficulty passing  urine  signs of decreased platelets or bleeding - bruising, pinpoint red spots on the skin, black, tarry stools, nosebleeds  signs of decreased red blood cells - unusually weak or tired, fainting spells, lightheadedness  breathing problems  changes in hearing  changes in vision  chest pain  high blood pressure  low blood counts - This drug may decrease the number of white blood cells, red blood cells and platelets. You may be at increased risk for infections and bleeding.  nausea and vomiting  pain, swelling, redness or irritation at the injection site  pain, tingling, numbness in the hands or feet  problems with balance, talking, walking  trouble passing urine or change in the amount of urine Side effects that usually do not require medical attention (report to your doctor or health care professional if they continue or are bothersome):  hair loss  loss of appetite  metallic taste in the mouth or changes in taste This list may not describe all possible side effects. Call your doctor for medical advice about side effects.  You may report side effects to FDA at 1-800-FDA-1088. Where should I keep my medicine? This drug is given in a hospital or clinic and will not be stored at home. NOTE: This sheet is a summary. It may not cover all possible information. If you have questions about this medicine, talk to your doctor, pharmacist, or health care provider.  2021 Elsevier/Gold Standard (2007-12-20 14:38:05)  Etoposide, VP-16 injection What is this medicine? ETOPOSIDE, VP-16 (e toe POE side) is a chemotherapy drug. It is used to treat testicular cancer, lung cancer, and other cancers. This medicine may be used for other purposes; ask your health care provider or pharmacist if you have questions. COMMON BRAND NAME(S): Etopophos, Toposar, VePesid What should I tell my health care provider before I take this medicine? They need to know if you have any of these  conditions:  infection  kidney disease  liver disease  low blood counts, like low white cell, platelet, or red cell counts  an unusual or allergic reaction to etoposide, other medicines, foods, dyes, or preservatives  pregnant or trying to get pregnant  breast-feeding How should I use this medicine? This medicine is for infusion into a vein. It is administered in a hospital or clinic by a specially trained health care professional. Talk to your pediatrician regarding the use of this medicine in children. Special care may be needed. Overdosage: If you think you have taken too much of this medicine contact a poison control center or emergency room at once. NOTE: This medicine is only for you. Do not share this medicine with others. What if I miss a dose? It is important not to miss your dose. Call your doctor or health care professional if you are unable to keep an appointment. What may interact with this medicine? This medicine may interact with the following medications:  warfarin This list may not describe all possible interactions. Give your health care provider a list of all the medicines, herbs, non-prescription drugs, or dietary supplements you use. Also tell them if you smoke, drink alcohol, or use illegal drugs. Some items may interact with your medicine. What should I watch for while using this medicine? Visit your doctor for checks on your progress. This drug may make you feel generally unwell. This is not uncommon, as chemotherapy can affect healthy cells as well as cancer cells. Report any side effects. Continue your course of treatment even though you feel ill unless your doctor tells you to stop. In some cases, you may be given additional medicines to help with side effects. Follow all directions for their use. Call your doctor or health care professional for advice if you get a fever, chills or sore throat, or other symptoms of a cold or flu. Do not treat yourself. This  drug decreases your body's ability to fight infections. Try to avoid being around people who are sick. This medicine may increase your risk to bruise or bleed. Call your doctor or health care professional if you notice any unusual bleeding. Talk to your doctor about your risk of cancer. You may be more at risk for certain types of cancers if you take this medicine. Do not become pregnant while taking this medicine or for at least 6 months after stopping it. Women should inform their doctor if they wish to become pregnant or think they might be pregnant. Women of child-bearing potential will need to have a negative pregnancy test before starting this medicine. There is a potential for serious side effects to  an unborn child. Talk to your health care professional or pharmacist for more information. Do not breast-feed an infant while taking this medicine. Men must use a latex condom during sexual contact with a woman while taking this medicine and for at least 4 months after stopping it. A latex condom is needed even if you have had a vasectomy. Contact your doctor right away if your partner becomes pregnant. Do not donate sperm while taking this medicine and for at least 4 months after you stop taking this medicine. Men should inform their doctors if they wish to father a child. This medicine may lower sperm counts. What side effects may I notice from receiving this medicine? Side effects that you should report to your doctor or health care professional as soon as possible:  allergic reactions like skin rash, itching or hives, swelling of the face, lips, or tongue  low blood counts - this medicine may decrease the number of white blood cells, red blood cells, and platelets. You may be at increased risk for infections and bleeding  nausea, vomiting  redness, blistering, peeling or loosening of the skin, including inside the mouth  signs and symptoms of infection like fever; chills; cough; sore throat;  pain or trouble passing urine  signs and symptoms of low red blood cells or anemia such as unusually weak or tired; feeling faint or lightheaded; falls; breathing problems  unusual bruising or bleeding Side effects that usually do not require medical attention (report to your doctor or health care professional if they continue or are bothersome):  changes in taste  diarrhea  hair loss  loss of appetite  mouth sores This list may not describe all possible side effects. Call your doctor for medical advice about side effects. You may report side effects to FDA at 1-800-FDA-1088. Where should I keep my medicine? This drug is given in a hospital or clinic and will not be stored at home. NOTE: This sheet is a summary. It may not cover all possible information. If you have questions about this medicine, talk to your doctor, pharmacist, or health care provider.  2021 Elsevier/Gold Standard (2018-11-09 16:57:15)

## 2021-01-02 ENCOUNTER — Inpatient Hospital Stay: Payer: PPO

## 2021-01-02 ENCOUNTER — Ambulatory Visit
Admission: RE | Admit: 2021-01-02 | Discharge: 2021-01-02 | Disposition: A | Discharge status: Home / Self Care | Payer: PPO | Source: Ambulatory Visit | Attending: Radiation Oncology | Admitting: Radiation Oncology

## 2021-01-02 ENCOUNTER — Other Ambulatory Visit: Payer: Self-pay

## 2021-01-02 VITALS — BP 120/60 | HR 68 | Temp 98.5°F | Resp 18

## 2021-01-02 DIAGNOSIS — Z5112 Encounter for antineoplastic immunotherapy: Secondary | ICD-10-CM | POA: Diagnosis not present

## 2021-01-02 DIAGNOSIS — Z510 Encounter for antineoplastic radiation therapy: Secondary | ICD-10-CM | POA: Diagnosis not present

## 2021-01-02 DIAGNOSIS — C3491 Malignant neoplasm of unspecified part of right bronchus or lung: Secondary | ICD-10-CM

## 2021-01-02 MED ORDER — SODIUM CHLORIDE 0.9% FLUSH
10.0000 mL | INTRAVENOUS | Status: DC | PRN
  Administered 2021-01-02: 10 mL
  Filled 2021-01-02: qty 10

## 2021-01-02 MED ORDER — SODIUM CHLORIDE 0.9 % IV SOLN
100.0000 mg/m2 | Freq: Once | INTRAVENOUS | Status: AC
  Administered 2021-01-02: 200 mg via INTRAVENOUS
  Filled 2021-01-02: qty 10

## 2021-01-02 MED ORDER — DEXAMETHASONE SODIUM PHOSPHATE 100 MG/10ML IJ SOLN
10.0000 mg | Freq: Once | INTRAMUSCULAR | Status: AC
  Administered 2021-01-02: 10 mg via INTRAVENOUS
  Filled 2021-01-02: qty 10

## 2021-01-02 MED ORDER — SODIUM CHLORIDE 0.9 % IV SOLN
Freq: Once | INTRAVENOUS | Status: AC
Start: 2021-01-02 — End: 2021-01-02
  Filled 2021-01-02: qty 250

## 2021-01-02 MED ORDER — HEPARIN SOD (PORK) LOCK FLUSH 100 UNIT/ML IV SOLN
500.0000 [IU] | Freq: Once | INTRAVENOUS | Status: AC | PRN
  Administered 2021-01-02: 500 [IU]
  Filled 2021-01-02: qty 5

## 2021-01-02 MED ORDER — TRILACICLIB DIHYDROCHLORIDE INJECTION 300 MG
240.0000 mg/m2 | Freq: Once | INTRAVENOUS | Status: AC
  Administered 2021-01-02: 465 mg via INTRAVENOUS
  Filled 2021-01-02: qty 31

## 2021-01-02 NOTE — Patient Instructions (Signed)
[facility] Discharge Instructions for Patients Receiving Chemotherapy  Today you received the following chemotherapy agents: etoposide  To help prevent nausea and vomiting after your treatment, we encourage you to take your nausea medication as directed.   If you develop nausea and vomiting that is not controlled by your nausea medication, call the clinic.   BELOW ARE SYMPTOMS THAT SHOULD BE REPORTED IMMEDIATELY:  *FEVER GREATER THAN 100.5 F  *CHILLS WITH OR WITHOUT FEVER  NAUSEA AND VOMITING THAT IS NOT CONTROLLED WITH YOUR NAUSEA MEDICATION  *UNUSUAL SHORTNESS OF BREATH  *UNUSUAL BRUISING OR BLEEDING  TENDERNESS IN MOUTH AND THROAT WITH OR WITHOUT PRESENCE OF ULCERS  *URINARY PROBLEMS  *BOWEL PROBLEMS  UNUSUAL RASH Items with * indicate a potential emergency and should be followed up as soon as possible.  Feel free to call the clinic should you have any questions or concerns. The clinic phone number is [phone].  Please show the [facility] at check-in to the Emergency Department and triage nurse.

## 2021-01-03 ENCOUNTER — Ambulatory Visit
Admission: RE | Admit: 2021-01-03 | Discharge: 2021-01-03 | Disposition: A | Discharge status: Home / Self Care | Payer: PPO | Source: Ambulatory Visit | Attending: Radiation Oncology | Admitting: Radiation Oncology

## 2021-01-03 ENCOUNTER — Other Ambulatory Visit: Payer: Self-pay

## 2021-01-03 DIAGNOSIS — C3490 Malignant neoplasm of unspecified part of unspecified bronchus or lung: Secondary | ICD-10-CM | POA: Diagnosis not present

## 2021-01-03 DIAGNOSIS — C7951 Secondary malignant neoplasm of bone: Secondary | ICD-10-CM | POA: Diagnosis not present

## 2021-01-03 DIAGNOSIS — Z510 Encounter for antineoplastic radiation therapy: Secondary | ICD-10-CM | POA: Diagnosis not present

## 2021-01-06 ENCOUNTER — Other Ambulatory Visit: Payer: Self-pay

## 2021-01-06 ENCOUNTER — Telehealth: Payer: Self-pay | Admitting: Physician Assistant

## 2021-01-06 ENCOUNTER — Telehealth: Payer: Self-pay

## 2021-01-06 ENCOUNTER — Ambulatory Visit
Admission: RE | Admit: 2021-01-06 | Discharge: 2021-01-06 | Disposition: A | Discharge status: Home / Self Care | Payer: PPO | Source: Ambulatory Visit | Attending: Radiation Oncology | Admitting: Radiation Oncology

## 2021-01-06 DIAGNOSIS — Z510 Encounter for antineoplastic radiation therapy: Secondary | ICD-10-CM | POA: Diagnosis not present

## 2021-01-06 NOTE — Telephone Encounter (Signed)
Pts wife, [name], [place] requesting to move pts appointment with Vito Backers, PA-C to an earlier time tomorrow 01/07/21 so he does not have to wait so long after his 12:30p lab appt.  I have sent a high priority schedule message to move the pt to 1:30pm.  Pts wife also wanted to know what the appt is for.   I reviewed pts last OV with Dr. Julien Nordmann on 12/19/20 and determined this is his follow-up appt since starting his tx.  I have left a detailed message for Mrs. Worthing with all of this information.

## 2021-01-06 NOTE — Telephone Encounter (Signed)
Moved upcoming appointment per 4/11 schedule message. Patient is aware of changes.

## 2021-01-07 ENCOUNTER — Encounter: Payer: Self-pay | Admitting: Physician Assistant

## 2021-01-07 ENCOUNTER — Ambulatory Visit
Admission: RE | Admit: 2021-01-07 | Discharge: 2021-01-07 | Disposition: A | Discharge status: Home / Self Care | Payer: PPO | Source: Ambulatory Visit | Attending: Radiation Oncology | Admitting: Radiation Oncology

## 2021-01-07 ENCOUNTER — Inpatient Hospital Stay (HOSPITAL_BASED_OUTPATIENT_CLINIC_OR_DEPARTMENT_OTHER): Payer: PPO | Admitting: Physician Assistant

## 2021-01-07 ENCOUNTER — Inpatient Hospital Stay: Payer: PPO

## 2021-01-07 VITALS — BP 107/58 | HR 65 | Temp 98.5°F | Resp 13 | Ht 69.0 in | Wt 168.7 lb

## 2021-01-07 DIAGNOSIS — Z5112 Encounter for antineoplastic immunotherapy: Secondary | ICD-10-CM | POA: Diagnosis not present

## 2021-01-07 DIAGNOSIS — Z510 Encounter for antineoplastic radiation therapy: Secondary | ICD-10-CM | POA: Diagnosis not present

## 2021-01-07 DIAGNOSIS — C3491 Malignant neoplasm of unspecified part of right bronchus or lung: Secondary | ICD-10-CM | POA: Diagnosis not present

## 2021-01-07 LAB — CBC WITH DIFFERENTIAL (CANCER CENTER ONLY)
Abs Immature Granulocytes: 0.05 10*3/uL (ref 0.00–0.07)
Basophils Absolute: 0 10*3/uL (ref 0.0–0.1)
Basophils Relative: 1 %
Eosinophils Absolute: 0 10*3/uL (ref 0.0–0.5)
Eosinophils Relative: 2 %
HCT: 44.4 % (ref 39.0–52.0)
Hemoglobin: 13.8 g/dL (ref 13.0–17.0)
Immature Granulocytes: 2 %
Lymphocytes Relative: 5 %
Lymphs Abs: 0.1 10*3/uL — ABNORMAL LOW (ref 0.7–4.0)
MCH: 29.9 pg (ref 26.0–34.0)
MCHC: 31.1 g/dL (ref 30.0–36.0)
MCV: 96.3 fL (ref 80.0–100.0)
Monocytes Absolute: 0 10*3/uL — ABNORMAL LOW (ref 0.1–1.0)
Monocytes Relative: 1 %
Neutro Abs: 2.2 10*3/uL (ref 1.7–7.7)
Neutrophils Relative %: 89 %
Platelet Count: 49 10*3/uL — ABNORMAL LOW (ref 150–400)
RBC: 4.61 MIL/uL (ref 4.22–5.81)
RDW: 13.8 % (ref 11.5–15.5)
WBC Count: 2.4 10*3/uL — ABNORMAL LOW (ref 4.0–10.5)
nRBC: 0 % (ref 0.0–0.2)

## 2021-01-07 LAB — CMP (CANCER CENTER ONLY)
ALT: <6 U/L (ref 0–44)
AST: 9 U/L — ABNORMAL LOW (ref 15–41)
Albumin: 3.5 g/dL (ref 3.5–5.0)
Alkaline Phosphatase: 75 U/L (ref 38–126)
Anion gap: 7 (ref 5–15)
BUN: 26 mg/dL — ABNORMAL HIGH (ref 8–23)
CO2: 26 mmol/L (ref 22–32)
Calcium: 8.7 mg/dL — ABNORMAL LOW (ref 8.9–10.3)
Chloride: 106 mmol/L (ref 98–111)
Creatinine: 0.92 mg/dL (ref 0.61–1.24)
GFR, Estimated: >60 mL/min (ref >60)
Glucose, Bld: 71 mg/dL (ref 70–99)
Potassium: 5 mmol/L (ref 3.5–5.1)
Sodium: 139 mmol/L (ref 135–145)
Total Bilirubin: 0.3 mg/dL (ref 0.3–1.2)
Total Protein: 6.3 g/dL — ABNORMAL LOW (ref 6.5–8.1)

## 2021-01-07 LAB — TSH: TSH: 0.907 u[IU]/mL (ref 0.320–4.118)

## 2021-01-08 ENCOUNTER — Telehealth: Payer: Self-pay | Admitting: Hematology and Oncology

## 2021-01-08 ENCOUNTER — Ambulatory Visit: Payer: PPO

## 2021-01-08 ENCOUNTER — Telehealth: Payer: Self-pay | Admitting: Internal Medicine

## 2021-01-08 ENCOUNTER — Other Ambulatory Visit: Payer: Self-pay

## 2021-01-08 ENCOUNTER — Ambulatory Visit
Admission: RE | Admit: 2021-01-08 | Discharge: 2021-01-08 | Disposition: A | Discharge status: Home / Self Care | Payer: PPO | Source: Ambulatory Visit | Attending: Radiation Oncology | Admitting: Radiation Oncology

## 2021-01-08 DIAGNOSIS — Z510 Encounter for antineoplastic radiation therapy: Secondary | ICD-10-CM | POA: Diagnosis not present

## 2021-01-08 NOTE — Telephone Encounter (Signed)
Scheduled per los. Called and left msg. Mailed printout  °

## 2021-01-09 ENCOUNTER — Ambulatory Visit
Admission: RE | Admit: 2021-01-09 | Discharge: 2021-01-09 | Disposition: A | Discharge status: Home / Self Care | Payer: PPO | Source: Ambulatory Visit | Attending: Radiation Oncology | Admitting: Radiation Oncology

## 2021-01-09 DIAGNOSIS — Z510 Encounter for antineoplastic radiation therapy: Secondary | ICD-10-CM | POA: Diagnosis not present

## 2021-01-10 ENCOUNTER — Other Ambulatory Visit: Payer: Self-pay

## 2021-01-10 ENCOUNTER — Ambulatory Visit
Admission: RE | Admit: 2021-01-10 | Discharge: 2021-01-10 | Disposition: A | Discharge status: Home / Self Care | Payer: PPO | Source: Ambulatory Visit | Attending: Radiation Oncology | Admitting: Radiation Oncology

## 2021-01-10 DIAGNOSIS — Z510 Encounter for antineoplastic radiation therapy: Secondary | ICD-10-CM | POA: Diagnosis not present

## 2021-01-10 DIAGNOSIS — C3490 Malignant neoplasm of unspecified part of unspecified bronchus or lung: Secondary | ICD-10-CM | POA: Diagnosis not present

## 2021-01-10 DIAGNOSIS — C7951 Secondary malignant neoplasm of bone: Secondary | ICD-10-CM | POA: Diagnosis not present

## 2021-01-13 ENCOUNTER — Other Ambulatory Visit: Payer: Self-pay

## 2021-01-13 ENCOUNTER — Ambulatory Visit
Admission: RE | Admit: 2021-01-13 | Discharge: 2021-01-13 | Disposition: A | Discharge status: Home / Self Care | Payer: PPO | Source: Ambulatory Visit | Attending: Radiation Oncology | Admitting: Radiation Oncology

## 2021-01-13 DIAGNOSIS — Z510 Encounter for antineoplastic radiation therapy: Secondary | ICD-10-CM | POA: Diagnosis not present

## 2021-01-14 ENCOUNTER — Ambulatory Visit: Payer: PPO

## 2021-01-14 ENCOUNTER — Ambulatory Visit: Payer: PPO | Admitting: Internal Medicine

## 2021-01-14 ENCOUNTER — Inpatient Hospital Stay: Payer: PPO

## 2021-01-14 ENCOUNTER — Telehealth: Payer: Self-pay | Admitting: Medical Oncology

## 2021-01-14 ENCOUNTER — Other Ambulatory Visit: Payer: PPO

## 2021-01-14 ENCOUNTER — Other Ambulatory Visit: Payer: Self-pay | Admitting: Physician Assistant

## 2021-01-14 ENCOUNTER — Ambulatory Visit
Admission: RE | Admit: 2021-01-14 | Discharge: 2021-01-14 | Disposition: A | Discharge status: Home / Self Care | Payer: PPO | Source: Ambulatory Visit | Attending: Radiation Oncology | Admitting: Radiation Oncology

## 2021-01-14 DIAGNOSIS — C3491 Malignant neoplasm of unspecified part of right bronchus or lung: Secondary | ICD-10-CM

## 2021-01-14 DIAGNOSIS — Z510 Encounter for antineoplastic radiation therapy: Secondary | ICD-10-CM | POA: Diagnosis not present

## 2021-01-14 DIAGNOSIS — Z95828 Presence of other vascular implants and grafts: Secondary | ICD-10-CM

## 2021-01-14 DIAGNOSIS — Z5112 Encounter for antineoplastic immunotherapy: Secondary | ICD-10-CM | POA: Diagnosis not present

## 2021-01-14 LAB — CBC WITH DIFFERENTIAL (CANCER CENTER ONLY)
Abs Immature Granulocytes: 0.01 10*3/uL (ref 0.00–0.07)
Basophils Absolute: 0 10*3/uL (ref 0.0–0.1)
Basophils Relative: 2 %
Eosinophils Absolute: 0 10*3/uL (ref 0.0–0.5)
Eosinophils Relative: 5 %
HCT: 43.3 % (ref 39.0–52.0)
Hemoglobin: 13.7 g/dL (ref 13.0–17.0)
Immature Granulocytes: 2 %
Lymphocytes Relative: 18 %
Lymphs Abs: 0.1 10*3/uL — ABNORMAL LOW (ref 0.7–4.0)
MCH: 30.6 pg (ref 26.0–34.0)
MCHC: 31.6 g/dL (ref 30.0–36.0)
MCV: 96.9 fL (ref 80.0–100.0)
Monocytes Absolute: 0.3 10*3/uL (ref 0.1–1.0)
Monocytes Relative: 53 %
Neutro Abs: 0.1 10*3/uL — CL (ref 1.7–7.7)
Neutrophils Relative %: 20 %
Platelet Count: 47 10*3/uL — ABNORMAL LOW (ref 150–400)
RBC: 4.47 MIL/uL (ref 4.22–5.81)
RDW: 14.3 % (ref 11.5–15.5)
WBC Count: 0.6 10*3/uL — CL (ref 4.0–10.5)
nRBC: 0 % (ref 0.0–0.2)

## 2021-01-14 LAB — CMP (CANCER CENTER ONLY)
ALT: 6 U/L (ref 0–44)
AST: 9 U/L — ABNORMAL LOW (ref 15–41)
Albumin: 3.6 g/dL (ref 3.5–5.0)
Alkaline Phosphatase: 88 U/L (ref 38–126)
Anion gap: 10 (ref 5–15)
BUN: 23 mg/dL (ref 8–23)
CO2: 24 mmol/L (ref 22–32)
Calcium: 8.6 mg/dL — ABNORMAL LOW (ref 8.9–10.3)
Chloride: 108 mmol/L (ref 98–111)
Creatinine: 1.06 mg/dL (ref 0.61–1.24)
GFR, Estimated: >60 mL/min (ref >60)
Glucose, Bld: 62 mg/dL — ABNORMAL LOW (ref 70–99)
Potassium: 4.5 mmol/L (ref 3.5–5.1)
Sodium: 142 mmol/L (ref 135–145)
Total Bilirubin: 0.3 mg/dL (ref 0.3–1.2)
Total Protein: 6.5 g/dL (ref 6.5–8.1)

## 2021-01-14 MED ORDER — FILGRASTIM-SNDZ 300 MCG/0.5ML IJ SOSY
300.0000 ug | PREFILLED_SYRINGE | Freq: Once | INTRAMUSCULAR | Status: AC
  Administered 2021-01-14: 300 ug via SUBCUTANEOUS

## 2021-01-14 MED ORDER — TBO-FILGRASTIM 300 MCG/0.5ML ~~LOC~~ SOSY
300.0000 ug | PREFILLED_SYRINGE | Freq: Once | SUBCUTANEOUS | Status: DC
  Filled 2021-01-14: qty 0.5

## 2021-01-14 NOTE — Telephone Encounter (Signed)
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

## 2021-01-14 NOTE — Patient Instructions (Signed)
Tbo-Filgrastim injection What is this medicine? TBO-FILGRASTIM (T B O fil GRA stim) is a granulocyte colony-stimulating factor that helps you make more neutrophils, a type of white blood cell. Neutrophils are important for fighting infections. Some chemotherapy affects your bone marrow and lowers your neutrophils. This medicine helps decrease the length of time that neutrophils are very low (severe neutropenia). This medicine may be used for other purposes; ask your health care provider or pharmacist if you have questions. COMMON BRAND NAME(S): Granix What should I tell my health care provider before I take this medicine? They need to know if you have any of these conditions:  bone scan or tests planned  kidney disease  sickle cell anemia  an unusual or allergic reaction to tbo-filgrastim, filgrastim, pegfilgrastim, other medicines, foods, dyes, or preservatives  pregnant or trying to get pregnant  breast-feeding How should I use this medicine? This medicine is for injection under the skin. If you get this medicine at home, you will be taught how to prepare and give this medicine. Refer to the Instructions for Use that come with your medication packaging. Use exactly as directed. Take your medicine at regular intervals. Do not take your medicine more often than directed. It is important that you put your used needles and syringes in a special sharps container. Do not put them in a trash can. If you do not have a sharps container, call your pharmacist or healthcare provider to get one. Talk to your pediatrician regarding the use of this medicine in children. While this drug may be prescribed for children as young as 65 month of age for selected conditions, precautions do apply. Overdosage: If you think you have taken too much of this medicine contact a poison control center or emergency room at once. NOTE: This medicine is only for you. Do not share this medicine with others. What if I miss a  dose? It is important not to miss your dose. Call your doctor or health care professional if you miss a dose. What may interact with this medicine? This medicine may interact with the following medications:  medicines that may cause a release of neutrophils, such as lithium This list may not describe all possible interactions. Give your health care provider a list of all the medicines, herbs, non-prescription drugs, or dietary supplements you use. Also tell them if you smoke, drink alcohol, or use illegal drugs. Some items may interact with your medicine. What should I watch for while using this medicine? You may need blood work done while you are taking this medicine. What side effects may I notice from receiving this medicine? Side effects that you should report to your doctor or health care professional as soon as possible:  allergic reactions like skin rash, itching or hives, swelling of the face, lips, or tongue  back pain  blood in the urine  dark urine  dizziness  fast heartbeat  feeling faint  shortness of breath or breathing problems  signs and symptoms of infection like fever or chills; cough; or sore throat  signs and symptoms of kidney injury like trouble passing urine or change in the amount of urine  stomach or side pain, or pain at the shoulder  sweating  swelling of the legs, ankles, or abdomen  tiredness Side effects that usually do not require medical attention (report to your doctor or health care professional if they continue or are bothersome):  bone pain  diarrhea  headache  muscle pain  vomiting This list may  not describe all possible side effects. Call your doctor for medical advice about side effects. You may report side effects to FDA at 1-800-FDA-1088. Where should I keep my medicine? Keep out of the reach of children. Store in a refrigerator between 2 and 8 degrees C (36 and 46 degrees F). Keep in carton to protect from light. Throw  away this medicine if it is left out of the refrigerator for more than 5 consecutive days. Throw away any unused medicine after the expiration date. NOTE: This sheet is a summary. It may not cover all possible information. If you have questions about this medicine, talk to your doctor, pharmacist, or health care provider.  2021 Elsevier/Gold Standard (2018-07-16 19:58:39)

## 2021-01-15 ENCOUNTER — Other Ambulatory Visit: Payer: Self-pay

## 2021-01-15 ENCOUNTER — Ambulatory Visit: Payer: PPO

## 2021-01-15 ENCOUNTER — Inpatient Hospital Stay: Payer: PPO

## 2021-01-15 ENCOUNTER — Ambulatory Visit
Admission: RE | Admit: 2021-01-15 | Discharge: 2021-01-15 | Disposition: A | Discharge status: Home / Self Care | Payer: PPO | Source: Ambulatory Visit | Attending: Radiation Oncology | Admitting: Radiation Oncology

## 2021-01-15 VITALS — BP 107/59 | HR 71 | Temp 98.5°F | Resp 16

## 2021-01-15 DIAGNOSIS — Z5112 Encounter for antineoplastic immunotherapy: Secondary | ICD-10-CM | POA: Diagnosis not present

## 2021-01-15 DIAGNOSIS — Z510 Encounter for antineoplastic radiation therapy: Secondary | ICD-10-CM | POA: Diagnosis not present

## 2021-01-15 DIAGNOSIS — C3491 Malignant neoplasm of unspecified part of right bronchus or lung: Secondary | ICD-10-CM

## 2021-01-15 DIAGNOSIS — Z95828 Presence of other vascular implants and grafts: Secondary | ICD-10-CM

## 2021-01-15 MED ORDER — FILGRASTIM-SNDZ 300 MCG/0.5ML IJ SOSY
PREFILLED_SYRINGE | INTRAMUSCULAR | Status: AC
  Filled 2021-01-15: qty 0.5

## 2021-01-15 MED ORDER — FILGRASTIM-SNDZ 300 MCG/0.5ML IJ SOSY
300.0000 ug | PREFILLED_SYRINGE | Freq: Once | INTRAMUSCULAR | Status: AC
  Administered 2021-01-15: 300 ug via SUBCUTANEOUS

## 2021-01-15 NOTE — Patient Instructions (Signed)

## 2021-01-16 ENCOUNTER — Inpatient Hospital Stay: Payer: PPO

## 2021-01-16 ENCOUNTER — Ambulatory Visit
Admission: RE | Admit: 2021-01-16 | Discharge: 2021-01-16 | Disposition: A | Discharge status: Home / Self Care | Payer: PPO | Source: Ambulatory Visit | Attending: Radiation Oncology | Admitting: Radiation Oncology

## 2021-01-16 ENCOUNTER — Encounter: Payer: Self-pay | Admitting: Radiation Oncology

## 2021-01-16 ENCOUNTER — Ambulatory Visit: Payer: PPO

## 2021-01-16 DIAGNOSIS — Z95828 Presence of other vascular implants and grafts: Secondary | ICD-10-CM

## 2021-01-16 DIAGNOSIS — C7951 Secondary malignant neoplasm of bone: Secondary | ICD-10-CM | POA: Diagnosis not present

## 2021-01-16 DIAGNOSIS — C3491 Malignant neoplasm of unspecified part of right bronchus or lung: Secondary | ICD-10-CM

## 2021-01-16 DIAGNOSIS — Z510 Encounter for antineoplastic radiation therapy: Secondary | ICD-10-CM | POA: Diagnosis not present

## 2021-01-16 DIAGNOSIS — C3490 Malignant neoplasm of unspecified part of unspecified bronchus or lung: Secondary | ICD-10-CM | POA: Diagnosis not present

## 2021-01-16 DIAGNOSIS — Z5112 Encounter for antineoplastic immunotherapy: Secondary | ICD-10-CM | POA: Diagnosis not present

## 2021-01-16 MED ORDER — FILGRASTIM-SNDZ 300 MCG/0.5ML IJ SOSY
300.0000 ug | PREFILLED_SYRINGE | Freq: Once | INTRAMUSCULAR | Status: AC
  Administered 2021-01-16: 300 ug via SUBCUTANEOUS

## 2021-01-16 MED ORDER — FILGRASTIM-SNDZ 300 MCG/0.5ML IJ SOSY
PREFILLED_SYRINGE | INTRAMUSCULAR | Status: AC
  Filled 2021-01-16: qty 0.5

## 2021-01-19 NOTE — Progress Notes (Signed)
  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  Intent: Palliative  Radiation Treatment Dates: 12/30/2020 through 01/16/2021 Site Technique Total Dose (Gy) Dose per Fx (Gy) Completed Fx Beam Energies  Hip, Right: Pelvis_Rt Complex 35/35 2.5 14/14 6X, 10X  Ribs, Right: Chest_Rt_Rt 7 Complex 35/35 2.5 14/14 10X   Narrative: The patient tolerated radiation therapy relatively well. He did note fatigue but no pain toward the end of treatment.  Plan: The patient will receive a call in about one month from the radiation oncology department. He will continue follow up with Dr. Julien Nordmann as well. His MRI brain will be repeated by Dr. Julien Nordmann as well and if there is still disease as was found prior to his palliative bone treatment above, Dr. Lisbeth Renshaw will offer additional radiotherapy.   ________________________________________________    Carola Rhine, PAC

## 2021-01-20 NOTE — Progress Notes (Signed)
[facility] OFFICE PROGRESS NOTE  [name], MD [address] [place] [address]  DIAGNOSIS: Extensive stage (T2 a, N2, M1 C) small cell lung cancer presented with right hilar mass in addition to mediastinal lymphadenopathy as well as metastatic disease to the bone with complete replacement of the L5 status post laminectomy with resection of epidural tumor as well as metastatic disease to thebrain, bones and bilateraladrenal glandsdiagnosed in January 2022.   PRIOR THERAPY: 1) Status post palliative radiotherapy to the resected area at L5 under the care of Dr. Lisbeth Renshaw. 2) palliative radiotherapy to the right hip under the care of Dr. Lisbeth Renshaw last treatment expected on 01/16/2021.  CURRENT THERAPY:  1) Systemic chemotherapy with carboplatin for AUC of 5 from day 1, etoposide 100 mg/M2 on days 1, 2 and 3 with Cosela 240 mg/M2 before chemotherapy in addition to Imfinzi 1500 mg IV every 3 weeks with day one of the chemotherapy. First dose April 4th, 2022. Neulasta support was added starting with cycle #2.    INTERVAL HISTORY: Gregory Hodges 80 y.o. male returns to the clinic today. The patient is feeling fair today without any concerning complaints except for he feels like he needs to urinate but is unable to get a lot out. Denies dysuria, history of BPH, malodorous urine, or cloudy urine. Denies back pain or abdominal pain.  The patient is status post his first cycle of systemic chemotherapy and he tolerated this very well without any concerning adverse side effects except for pancytopenia for which he required growth factor injections. We will make sure to add neulasta with cycle #2. He also had thrombocytopenia without any abnormal bleeding or bruising besides his baseline senile purpura.  The patient completed palliative radiotherapy to the L5 region as well as the right hip under the care of Dr. Lisbeth Renshaw.  His pain has resolved at this time.   Otherwise the  patient denies any recent fever, chills, night sweats, or weight loss. He notes he has a poor appetite. He does not like the taste of boost/ensure and has been making his own milkshakes. He denies any significant shortness of breath with exertion, chest pain, or hemoptysis.  He reports his baseline "smoker's cough which is unchanged.  He denies any nausea and vomiting. He had some diarrhea so took imodium which caused constipation. It is starting to ease up now.  He denies any rashes or skin changes.  The patient was found to have multiple metastatic brain lesions upon his staging work-up.  The plan was to complete 2 cycles of systemic chemotherapy before proceeding with whole brain radiation.  He is here today for evaluation and repeat blood work before starting cycle #2 of treatment.   MEDICAL HISTORY: Past Medical History:  Diagnosis Date  . 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 Cancer (HCC)    Skin  . Crohn's disease ([facility])   . E coli bacteremia   . History of kidney stones   . Hyperlipemia   . Hypertension    "denies"  . Myocardial infarction ([facility]) 08/11/2005   s/p Horizon study stent D1  . Peripheral vascular disease [facility]) June 2011  . 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  MEDICATIONS:  Current  Outpatient Medications  Medication Sig Dispense Refill  . acetaminophen (TYLENOL) 500 MG tablet Take 500 mg by mouth every 6 (six) hours as needed for moderate pain.    Marland Kitchen aspirin 81 MG tablet Take 81 mg by mouth daily.    Marland Kitchen atenolol (TENORMIN) 25 MG tablet Take 25 mg by mouth 2 (two) times daily.    . calcium carbonate (TUMS - DOSED IN MG ELEMENTAL CALCIUM) 500 MG chewable tablet  Chew 2 tablets by mouth daily as needed for indigestion or heartburn.    . diphenhydrAMINE (BENADRYL) 25 MG tablet Take 50 mg by mouth daily as needed for allergies.    . ferrous sulfate 325 (65 FE) MG tablet Take 325 mg by mouth every Monday, Wednesday, and Friday.    . lidocaine-prilocaine (EMLA) cream Apply to the Port-A-Cath site 30-60 minutes before treatment 30 g 0  . nitroGLYCERIN (NITROSTAT) 0.4 MG SL tablet Place 0.4 mg under the tongue every 5 (five) minutes as needed for chest pain.    Marland Kitchen omeprazole (PRILOSEC) 10 MG capsule Take 10 mg by mouth daily.    Marland Kitchen oxymetazoline (AFRIN) 0.05 % nasal spray Place 1 spray into both nostrils 2 (two) times daily as needed for congestion.    . prochlorperazine (COMPAZINE) 10 MG tablet Take 1 tablet (10 mg total) by mouth every 6 (six) hours as needed for nausea or vomiting. 30 tablet 0  . rosuvastatin (CRESTOR) 10 MG tablet Take 10 mg by mouth daily.    Marland Kitchen sulfamethoxazole-trimethoprim (BACTRIM DS) 800-160 MG per tablet Take 1 tablet by mouth 2 (two) times daily. 800-160 mg 60 tablet 11   No current facility-administered medications for this visit.    SURGICAL HISTORY:  Past Surgical History:  Procedure Laterality Date  . ABDOMINAL AORTIC ANEURYSM REPAIR  03/03/2010  . AXILLARY-FEMORAL BYPASS GRAFT  07/22/2010   bilateral  . bowel     herniated bowel  . CARDIAC CATHETERIZATION    . CORONARY STENT PLACEMENT    . CYSTOSCOPY    . IR IMAGING GUIDED PORT INSERTION  12/27/2020  . LAMINECTOMY N/A 10/22/2020   Procedure: Lumbar five Open Laminectomy for tumor resection;  Surgeon: Judith Part, MD;  Location: [facility];  Service: Neurosurgery;  Laterality: N/A;  . MOHS SURGERY     several     REVIEW OF SYSTEMS:   Review of Systems  Constitutional: Positive for fatigue and decreased appetite. Negative for chills, fever and unexpected weight change.  HENT: Negative for mouth sores, nosebleeds, sore throat and trouble swallowing.   Eyes: Negative  for eye problems and icterus.  Respiratory: Positive for baseline smokers cough. Negative for hemoptysis, shortness of breath and wheezing.   Cardiovascular: Negative for chest pain and leg swelling.  Gastrointestinal: Positive for constipation. Negative for abdominal pain, diarrhea, nausea and vomiting.  Genitourinary: Positive for difficulty urinating. Negative for bladder incontinence, dysuria, frequency and hematuria.   Musculoskeletal: Negative for back pain, gait problem, neck pain and neck stiffness.  Skin: Negative for itching and rash.  Neurological: Negative for dizziness, extremity weakness, gait problem, headaches, light-headedness and seizures.  Hematological: Negative for adenopathy. Does not bruise/bleed easily.  Psychiatric/Behavioral: Negative for confusion, depression and sleep disturbance. The patient is not nervous/anxious.     PHYSICAL EXAMINATION:  There were no vitals taken for this visit.  ECOG PERFORMANCE STATUS: 1 - Symptomatic but completely ambulatory  Physical Exam  Constitutional: Oriented to person, place, and time and well-developed, well-nourished, and in no distress.  HENT:  Head: Normocephalic and atraumatic.  Mouth/Throat: Oropharynx is clear and moist. No oropharyngeal exudate.  Eyes: Conjunctivae are normal. Right eye exhibits no discharge. Left eye exhibits no discharge. No scleral icterus.  Neck: Normal range of motion. Neck supple.  Cardiovascular: Normal rate, regular rhythm, normal heart sounds and intact distal pulses.   Pulmonary/Chest: Effort normal and breath sounds normal. Mild wheezing noted. No respiratory distress. No rales.  Abdominal: Soft. Large hernia. Bowel sounds are normal. Exhibits no distension and no mass. There is no tenderness.  Musculoskeletal: Normal range of motion. Exhibits no edema.  Lymphadenopathy:    No cervical adenopathy.  Neurological: Alert and oriented to person, place, and time. Exhibits muscle wasting.   Examined in the wheelchair.  Skin: Skin is warm and dry. No rash noted. Not diaphoretic. No erythema. No pallor.  Psychiatric: Mood, memory and judgment normal.  Vitals reviewed.  LABORATORY DATA: Lab Results  Component Value Date   WBC 0.6 (LL) 01/14/2021   HGB 13.7 01/14/2021   HCT 43.3 01/14/2021   MCV 96.9 01/14/2021   PLT 47 (L) 01/14/2021      Chemistry      Component Value Date/Time   NA 142 01/14/2021 1218   K 4.5 01/14/2021 1218   CL 108 01/14/2021 1218   CO2 24 01/14/2021 1218   BUN 23 01/14/2021 1218   CREATININE 1.06 01/14/2021 1218      Component Value Date/Time   CALCIUM 8.6 (L) 01/14/2021 1218   ALKPHOS 88 01/14/2021 1218   AST 9 (L) 01/14/2021 1218   ALT 6 01/14/2021 1218   BILITOT 0.3 01/14/2021 1218       RADIOGRAPHIC STUDIES:  IR IMAGING GUIDED PORT INSERTION  Result Date: 12/27/2020 INDICATION: Metastatic small cell lung cancer EXAM: IMPLANTED PORT A CATH PLACEMENT WITH ULTRASOUND AND FLUOROSCOPIC GUIDANCE MEDICATIONS: None ANESTHESIA/SEDATION: Moderate (conscious) sedation was employed during this procedure. A total of Versed to mg and Fentanyl 100 mcg was administered intravenously. Moderate Sedation Time: 21 minutes. The patient's level of consciousness and vital signs were monitored continuously by radiology nursing throughout the procedure under my direct supervision. FLUOROSCOPY TIME:  One minutes, 0 seconds (9 mGy) COMPLICATIONS: None immediate. PROCEDURE: The procedure, risks, benefits, and alternatives were explained to the patient. Questions regarding the procedure were encouraged and answered. The patient understands and consents to the procedure. A timeout was performed prior to the initiation of the procedure. Patient positioned supine on the angiography table. Right neck and anterior upper chest prepped and draped in the usual sterile fashion. All elements of maximal sterile barrier were utilized including, cap, mask, sterile gown, sterile  gloves, large sterile drape, hand scrubbing and 2% Chlorhexidine for skin cleaning. The right internal jugular vein was evaluated with ultrasound and shown to be patent. A permanent ultrasound image was obtained and placed in the patient's medical record. Local anesthesia was provided with 1% lidocaine with epinephrine. Using sterile gel and a sterile probe cover, the right internal jugular vein was entered with a 21 ga needle during real time ultrasound guidance. 0.018 inch guidewire placed and 21 ga needle exchanged for transitional dilator set. Utilizing fluoroscopy, 0.035 inch guidewire advanced through the needle without difficulty. Attention then turned to the right anterior upper chest. Following local lidocaine administration, a port pocket was created. The catheter was connected to the port and brought from the pocket to the venotomy site through a subcutaneous tunnel. The catheter was cut to size and inserted through the peel-away sheath. The catheter tip was  positioned at the cavoatrial junction using fluoroscopic guidance. The port aspirated and flushed well. The port pocket was closed with deep and superficial absorbable suture. The port pocket incision and venotomy sites were also sealed with Dermabond. IMPRESSION: Successful placement of a right internal jugular approach power injectable Port-A-Cath. The catheter is ready for immediate use. Electronically Signed   By: Miachel Roux M.D.   On: 12/27/2020 15:44     ASSESSMENT/PLAN:  This is a very pleasant 80 year old Caucasian male diagnosed with extensive stage (T2a, N2, M1c) small cell lung cancer.  He presented with a right upper lobe lung lesion with extensive mediastinal and right hilar lymphadenopathy.  He also has extensive bone metastases as well as metastatic disease to the adrenal gland bilaterally and multiple brain lesions.  He was diagnosed in February 2022.    He is status post resection of the epidural tumor at L5.  He also  completed palliative radiotherapy to this lesion after resection.  The patient also completed palliative radiotherapy to the painful right hip lesion in April 2022.   The patient was found to have metastatic disease to the brain.  Per Dr. Julien Nordmann, the plan is to undergo 2 cycles of palliative systemic chemotherapy before proceeding with whole brain radiation. I will order a repeat brain MRI.  Additionally, we I will re-refer this patient to radiation oncology.   The patient is currently undergoing palliative systemic chemotherapy with carboplatin for an AUC of 5, etoposide 100 mg per metered squared on days 1, 2, and 3 and immunotherapy with Imfinzi 1500 mg IV every 3 weeks with Cosela.  He is status post his first cycle and tolerated it well without any notice able adverse side effects except for pancytopenia. We will add neulasta from cycle #2.   The patient was seen with Dr. Julien Nordmann. Labs were reviewed which showed improved WBC. Recommend he proceed with cycle #2 today as scheduled.   I will arrange for restaging CT scan the chest, abdomen, and pelvis prior to starting his next cycle of treatment.   We will see him back for follow-up visit in 3 weeks for evaluation and repeat blood work.  We will arrange for a urinalysis and culture today to rule out urinary tract infection.  If no evidence of infection, we will likely refer the patient to urology.   We will arrange for the patient to receive 1 L of normal saline today due to his hypotension.  I also discussed increasing his calorie intake and adding protein powder to his milkshakes.   Discussed lifestyle modifications to help with constipation if he is sensitive to taking the over-the-counter medications.  Discussed increasing activity, eating fruits and vegetables, drinking plenty of fluids will help with constipation.  The patient was advised to call immediately if he has any concerning symptoms in the interval. The patient voices  understanding of current disease status and treatment options and is in agreement with the current care plan. All questions were answered. The patient knows to call the clinic with any problems, questions or concerns. We can certainly see the patient much sooner if necessary        No orders of the defined types were placed in this encounter.   Maximino Cozzolino L Shandi Godfrey, PA-C 01/20/21  ADDENDUM: Hematology/Oncology Attending: I had a face-to-face encounter with the patient today.  I reviewed his record and recommended his care plan.  This is a very pleasant 80 years old white male recently diagnosed with extensive stage small cell  lung cancer in February 2022 status post palliative radiotherapy to epidural tumor at L5 followed by resection. The patient is currently undergoing systemic chemotherapy with carboplatin, etoposide, Cosela as well as Imfinzi status post 1 cycle.  He tolerated the first cycle of his systemic chemotherapy fairly well except for fatigue.  He also has some urinary retention. I recommended for the patient to proceed with cycle #2 today as planned. The significant neutropenia after the first cycle of his treatment, I will arrange for the patient to receive Neulasta injection after his treatment. He will come back for follow-up visit in 3 weeks for evaluation with repeat CT scan of the chest, abdomen pelvis as well as MRI of the brain. Regarding the urinary retention, will check his urinalysis and if no concerning findings, we will refer him to his primary care physician or urology. For the dehydration, will arrange for the patient to receive 1 L of normal saline in the clinic today. He was advised to call immediately if he has any other concerning symptoms in the interval. The total time spent in the appointment was 31 minutes. Disclaimer: This note was dictated with voice recognition software. Similar sounding words can inadvertently be transcribed and may be missed  upon review. Eilleen Kempf, MD 01/22/21

## 2021-01-22 ENCOUNTER — Other Ambulatory Visit: Payer: PPO

## 2021-01-22 ENCOUNTER — Inpatient Hospital Stay: Payer: PPO

## 2021-01-22 ENCOUNTER — Other Ambulatory Visit: Payer: Self-pay

## 2021-01-22 ENCOUNTER — Telehealth: Payer: Self-pay | Admitting: Physician Assistant

## 2021-01-22 ENCOUNTER — Inpatient Hospital Stay: Payer: PPO | Admitting: Physician Assistant

## 2021-01-22 VITALS — BP 107/73

## 2021-01-22 VITALS — BP 98/59 | HR 74 | Temp 98.6°F | Resp 18 | Wt 169.1 lb

## 2021-01-22 DIAGNOSIS — C3490 Malignant neoplasm of unspecified part of unspecified bronchus or lung: Secondary | ICD-10-CM

## 2021-01-22 DIAGNOSIS — R300 Dysuria: Secondary | ICD-10-CM | POA: Diagnosis not present

## 2021-01-22 DIAGNOSIS — Z5112 Encounter for antineoplastic immunotherapy: Secondary | ICD-10-CM

## 2021-01-22 DIAGNOSIS — C3491 Malignant neoplasm of unspecified part of right bronchus or lung: Secondary | ICD-10-CM

## 2021-01-22 DIAGNOSIS — Z95828 Presence of other vascular implants and grafts: Secondary | ICD-10-CM

## 2021-01-22 DIAGNOSIS — I951 Orthostatic hypotension: Secondary | ICD-10-CM | POA: Insufficient documentation

## 2021-01-22 DIAGNOSIS — I959 Hypotension, unspecified: Secondary | ICD-10-CM

## 2021-01-22 DIAGNOSIS — Z5111 Encounter for antineoplastic chemotherapy: Secondary | ICD-10-CM

## 2021-01-22 DIAGNOSIS — R39198 Other difficulties with micturition: Secondary | ICD-10-CM | POA: Diagnosis not present

## 2021-01-22 LAB — CMP (CANCER CENTER ONLY)
ALT: <6 U/L (ref 0–44)
AST: 8 U/L — ABNORMAL LOW (ref 15–41)
Albumin: 2.9 g/dL — ABNORMAL LOW (ref 3.5–5.0)
Alkaline Phosphatase: 84 U/L (ref 38–126)
Anion gap: 8 (ref 5–15)
BUN: 17 mg/dL (ref 8–23)
CO2: 24 mmol/L (ref 22–32)
Calcium: 8.7 mg/dL — ABNORMAL LOW (ref 8.9–10.3)
Chloride: 107 mmol/L (ref 98–111)
Creatinine: 0.82 mg/dL (ref 0.61–1.24)
GFR, Estimated: >60 mL/min (ref >60)
Glucose, Bld: 109 mg/dL — ABNORMAL HIGH (ref 70–99)
Potassium: 3.9 mmol/L (ref 3.5–5.1)
Sodium: 139 mmol/L (ref 135–145)
Total Bilirubin: 0.3 mg/dL (ref 0.3–1.2)
Total Protein: 5.9 g/dL — ABNORMAL LOW (ref 6.5–8.1)

## 2021-01-22 LAB — URINALYSIS, COMPLETE (UACMP) WITH MICROSCOPIC
Bacteria, UA: NONE SEEN
Bilirubin Urine: NEGATIVE
Glucose, UA: NEGATIVE mg/dL
Hgb urine dipstick: NEGATIVE
Ketones, ur: NEGATIVE mg/dL
Nitrite: NEGATIVE
Protein, ur: 30 mg/dL — AB
Specific Gravity, Urine: 1.024 (ref 1.005–1.030)
pH: 5 (ref 5.0–8.0)

## 2021-01-22 LAB — CBC WITH DIFFERENTIAL (CANCER CENTER ONLY)
Abs Immature Granulocytes: 0.19 10*3/uL — ABNORMAL HIGH (ref 0.00–0.07)
Basophils Absolute: 0.1 10*3/uL (ref 0.0–0.1)
Basophils Relative: 1 %
Eosinophils Absolute: 0 10*3/uL (ref 0.0–0.5)
Eosinophils Relative: 0 %
HCT: 38.2 % — ABNORMAL LOW (ref 39.0–52.0)
Hemoglobin: 12.6 g/dL — ABNORMAL LOW (ref 13.0–17.0)
Immature Granulocytes: 3 %
Lymphocytes Relative: 2 %
Lymphs Abs: 0.1 10*3/uL — ABNORMAL LOW (ref 0.7–4.0)
MCH: 30.4 pg (ref 26.0–34.0)
MCHC: 33 g/dL (ref 30.0–36.0)
MCV: 92.3 fL (ref 80.0–100.0)
Monocytes Absolute: 1 10*3/uL (ref 0.1–1.0)
Monocytes Relative: 13 %
Neutro Abs: 6 10*3/uL (ref 1.7–7.7)
Neutrophils Relative %: 81 %
Platelet Count: 137 10*3/uL — ABNORMAL LOW (ref 150–400)
RBC: 4.14 MIL/uL — ABNORMAL LOW (ref 4.22–5.81)
RDW: 14.3 % (ref 11.5–15.5)
WBC Count: 7.3 10*3/uL (ref 4.0–10.5)
nRBC: 0 % (ref 0.0–0.2)

## 2021-01-22 MED ORDER — SODIUM CHLORIDE 0.9 % IV SOLN
448.0000 mg | Freq: Once | INTRAVENOUS | Status: AC
  Administered 2021-01-22: 450 mg via INTRAVENOUS
  Filled 2021-01-22: qty 45

## 2021-01-22 MED ORDER — SODIUM CHLORIDE 0.9 % IV SOLN
Freq: Once | INTRAVENOUS | Status: AC
Start: 2021-01-22 — End: 2021-01-22
  Filled 2021-01-22: qty 250

## 2021-01-22 MED ORDER — SODIUM CHLORIDE 0.9% FLUSH
10.0000 mL | Freq: Once | INTRAVENOUS | Status: AC
  Administered 2021-01-22: 10 mL
  Filled 2021-01-22: qty 10

## 2021-01-22 MED ORDER — PALONOSETRON HCL INJECTION 0.25 MG/5ML
0.2500 mg | Freq: Once | INTRAVENOUS | Status: AC
  Administered 2021-01-22: 0.25 mg via INTRAVENOUS

## 2021-01-22 MED ORDER — TRILACICLIB DIHYDROCHLORIDE INJECTION 300 MG
240.0000 mg/m2 | Freq: Once | INTRAVENOUS | Status: AC
  Administered 2021-01-22: 465 mg via INTRAVENOUS
  Filled 2021-01-22: qty 31

## 2021-01-22 MED ORDER — SODIUM CHLORIDE 0.9 % IV SOLN
Freq: Once | INTRAVENOUS | Status: AC
  Filled 2021-01-22: qty 250

## 2021-01-22 MED ORDER — PALONOSETRON HCL INJECTION 0.25 MG/5ML
INTRAVENOUS | Status: AC
  Filled 2021-01-22: qty 5

## 2021-01-22 MED ORDER — SODIUM CHLORIDE 0.9 % IV SOLN
100.0000 mg/m2 | Freq: Once | INTRAVENOUS | Status: AC
  Administered 2021-01-22: 200 mg via INTRAVENOUS
  Filled 2021-01-22: qty 10

## 2021-01-22 MED ORDER — SODIUM CHLORIDE 0.9 % IV SOLN
1500.0000 mg | Freq: Once | INTRAVENOUS | Status: AC
  Administered 2021-01-22: 1500 mg via INTRAVENOUS
  Filled 2021-01-22: qty 30

## 2021-01-22 MED ORDER — HEPARIN SOD (PORK) LOCK FLUSH 100 UNIT/ML IV SOLN
500.0000 [IU] | Freq: Once | INTRAVENOUS | Status: AC | PRN
  Administered 2021-01-22: 500 [IU]
  Filled 2021-01-22: qty 5

## 2021-01-22 MED ORDER — DEXAMETHASONE SODIUM PHOSPHATE 100 MG/10ML IJ SOLN
10.0000 mg | Freq: Once | INTRAMUSCULAR | Status: AC
  Administered 2021-01-22: 10 mg via INTRAVENOUS
  Filled 2021-01-22: qty 10

## 2021-01-22 MED ORDER — SODIUM CHLORIDE 0.9 % IV SOLN
150.0000 mg | Freq: Once | INTRAVENOUS | Status: AC
  Administered 2021-01-22: 150 mg via INTRAVENOUS
  Filled 2021-01-22: qty 150

## 2021-01-22 MED ORDER — SODIUM CHLORIDE 0.9% FLUSH
10.0000 mL | INTRAVENOUS | Status: DC | PRN
  Administered 2021-01-22: 10 mL
  Filled 2021-01-22: qty 10

## 2021-01-22 NOTE — Telephone Encounter (Signed)
I called the patient to review his UA. He notes that he had improvement in his symptoms and is initiating a normal urinary stream at this point. I discussed his results did not show evidence for infection but showed some crystals. He does have a history of nephrolithiasis. I encouraged him to push fluid and if he has new or worsening symptoms, to follow up with his urologist. He expressed understanding.

## 2021-01-22 NOTE — Patient Instructions (Signed)
[facility] ONCOLOGY  Discharge Instructions: Thank you for choosing [facility] to provide your oncology and hematology care.   If you have a lab appointment with the [facility], please go directly to the [facility] and check in at the registration area.   Wear comfortable clothing and clothing appropriate for easy access to any Portacath or PICC line.   We strive to give you quality time with your provider. You may need to reschedule your appointment if you arrive late (15 or more minutes).  Arriving late affects you and other patients whose appointments are after yours.  Also, if you miss three or more appointments without notifying the office, you may be dismissed from the clinic at the provider's discretion.      For prescription refill requests, have your pharmacy contact our office and allow 72 hours for refills to be completed.    Today you received the following chemotherapy and/or immunotherapy agents imfimzi, carboplatin, etoposide, cosela   To help prevent nausea and vomiting after your treatment, we encourage you to take your nausea medication as directed.  BELOW ARE SYMPTOMS THAT SHOULD BE REPORTED IMMEDIATELY: . *FEVER GREATER THAN 100.4 F (38 C) OR HIGHER . *CHILLS OR SWEATING . *NAUSEA AND VOMITING THAT IS NOT CONTROLLED WITH YOUR NAUSEA MEDICATION . *UNUSUAL SHORTNESS OF BREATH . *UNUSUAL BRUISING OR BLEEDING . *URINARY PROBLEMS (pain or burning when urinating, or frequent urination) . *BOWEL PROBLEMS (unusual diarrhea, constipation, pain near the anus) . TENDERNESS IN MOUTH AND THROAT WITH OR WITHOUT PRESENCE OF ULCERS (sore throat, sores in mouth, or a toothache) . UNUSUAL RASH, SWELLING OR PAIN  . UNUSUAL VAGINAL DISCHARGE OR ITCHING   Items with * indicate a potential emergency and should be followed up as soon as possible or go to the Emergency Department if any problems should occur.  Please show the CHEMOTHERAPY ALERT  CARD or IMMUNOTHERAPY ALERT CARD at check-in to the Emergency Department and triage nurse.  Should you have questions after your visit or need to cancel or reschedule your appointment, please contact [facility]  Dept: [phone]  and follow the prompts.  Office hours are 8:00 a.m. to 4:30 p.m. Monday - Friday. Please note that voicemails left after 4:00 p.m. may not be returned until the following business day.  We are closed weekends and major holidays. You have access to a nurse at all times for urgent questions. Please call the main number to the clinic Dept: [phone] and follow the prompts.   For any non-urgent questions, you may also contact your provider using MyChart. We now offer e-Visits for anyone 42 and older to request care online for non-urgent symptoms. For details visit [email or web address].   Also download the MyChart app! Go to the app store, search "MyChart", open the app, select [facility], and log in with your MyChart username and password.  Due to Covid, a mask is required upon entering the hospital/clinic. If you do not have a mask, one will be given to you upon arrival. For doctor visits, patients may have 1 support person aged 40 or older with them. For treatment visits, patients cannot have anyone with them due to current Covid guidelines and our immunocompromised population.

## 2021-01-23 ENCOUNTER — Inpatient Hospital Stay: Payer: PPO

## 2021-01-23 ENCOUNTER — Ambulatory Visit (HOSPITAL_COMMUNITY)
Admission: RE | Admit: 2021-01-23 | Discharge: 2021-01-23 | Disposition: A | Discharge status: Home / Self Care | Payer: PPO | Source: Ambulatory Visit | Attending: Medical | Admitting: Medical

## 2021-01-23 VITALS — BP 115/66 | HR 70 | Temp 98.3°F | Resp 16

## 2021-01-23 DIAGNOSIS — R911 Solitary pulmonary nodule: Secondary | ICD-10-CM | POA: Diagnosis not present

## 2021-01-23 DIAGNOSIS — K449 Diaphragmatic hernia without obstruction or gangrene: Secondary | ICD-10-CM | POA: Diagnosis not present

## 2021-01-23 DIAGNOSIS — Z5112 Encounter for antineoplastic immunotherapy: Secondary | ICD-10-CM | POA: Diagnosis not present

## 2021-01-23 DIAGNOSIS — C3491 Malignant neoplasm of unspecified part of right bronchus or lung: Secondary | ICD-10-CM

## 2021-01-23 DIAGNOSIS — R0602 Shortness of breath: Secondary | ICD-10-CM | POA: Diagnosis not present

## 2021-01-23 DIAGNOSIS — R0902 Hypoxemia: Secondary | ICD-10-CM | POA: Insufficient documentation

## 2021-01-23 DIAGNOSIS — I517 Cardiomegaly: Secondary | ICD-10-CM | POA: Diagnosis not present

## 2021-01-23 MED ORDER — HEPARIN SOD (PORK) LOCK FLUSH 100 UNIT/ML IV SOLN
500.0000 [IU] | Freq: Once | INTRAVENOUS | Status: AC | PRN
  Administered 2021-01-23: 500 [IU]
  Filled 2021-01-23: qty 5

## 2021-01-23 MED ORDER — SODIUM CHLORIDE 0.9 % IV SOLN
100.0000 mg/m2 | Freq: Once | INTRAVENOUS | Status: AC
  Administered 2021-01-23: 200 mg via INTRAVENOUS
  Filled 2021-01-23: qty 10

## 2021-01-23 MED ORDER — TRILACICLIB DIHYDROCHLORIDE INJECTION 300 MG
240.0000 mg/m2 | Freq: Once | INTRAVENOUS | Status: AC
  Administered 2021-01-23: 465 mg via INTRAVENOUS
  Filled 2021-01-23: qty 31

## 2021-01-23 MED ORDER — SODIUM CHLORIDE 0.9% FLUSH
10.0000 mL | INTRAVENOUS | Status: DC | PRN
  Administered 2021-01-23: 10 mL
  Filled 2021-01-23: qty 10

## 2021-01-23 MED ORDER — SODIUM CHLORIDE 0.9 % IV SOLN
10.0000 mg | Freq: Once | INTRAVENOUS | Status: AC
  Administered 2021-01-23: 10 mg via INTRAVENOUS
  Filled 2021-01-23: qty 10

## 2021-01-23 MED ORDER — SODIUM CHLORIDE 0.9 % IV SOLN
Freq: Once | INTRAVENOUS | Status: AC
  Filled 2021-01-23: qty 250

## 2021-01-23 NOTE — Patient Instructions (Signed)
[facility] ONCOLOGY  Discharge Instructions: Thank you for choosing [facility] to provide your oncology and hematology care.   If you have a lab appointment with the [facility], please go directly to the [facility] and check in at the registration area.   Wear comfortable clothing and clothing appropriate for easy access to any Portacath or PICC line.   We strive to give you quality time with your provider. You may need to reschedule your appointment if you arrive late (15 or more minutes).  Arriving late affects you and other patients whose appointments are after yours.  Also, if you miss three or more appointments without notifying the office, you may be dismissed from the clinic at the provider's discretion.      For prescription refill requests, have your pharmacy contact our office and allow 72 hours for refills to be completed.    Today you received the following chemotherapy and/or immunotherapy agents: etoposide.     To help prevent nausea and vomiting after your treatment, we encourage you to take your nausea medication as directed.  BELOW ARE SYMPTOMS THAT SHOULD BE REPORTED IMMEDIATELY: . *FEVER GREATER THAN 100.4 F (38 C) OR HIGHER . *CHILLS OR SWEATING . *NAUSEA AND VOMITING THAT IS NOT CONTROLLED WITH YOUR NAUSEA MEDICATION . *UNUSUAL SHORTNESS OF BREATH . *UNUSUAL BRUISING OR BLEEDING . *URINARY PROBLEMS (pain or burning when urinating, or frequent urination) . *BOWEL PROBLEMS (unusual diarrhea, constipation, pain near the anus) . TENDERNESS IN MOUTH AND THROAT WITH OR WITHOUT PRESENCE OF ULCERS (sore throat, sores in mouth, or a toothache) . UNUSUAL RASH, SWELLING OR PAIN  . UNUSUAL VAGINAL DISCHARGE OR ITCHING   Items with * indicate a potential emergency and should be followed up as soon as possible or go to the Emergency Department if any problems should occur.  Please show the CHEMOTHERAPY ALERT CARD or IMMUNOTHERAPY ALERT  CARD at check-in to the Emergency Department and triage nurse.  Should you have questions after your visit or need to cancel or reschedule your appointment, please contact [facility]  Dept: [phone]  and follow the prompts.  Office hours are 8:00 a.m. to 4:30 p.m. Monday - Friday. Please note that voicemails left after 4:00 p.m. may not be returned until the following business day.  We are closed weekends and major holidays. You have access to a nurse at all times for urgent questions. Please call the main number to the clinic Dept: [phone] and follow the prompts.   For any non-urgent questions, you may also contact your provider using MyChart. We now offer e-Visits for anyone 71 and older to request care online for non-urgent symptoms. For details visit [email or web address].   Also download the MyChart app! Go to the app store, search "MyChart", open the app, select [facility], and log in with your MyChart username and password.  Due to Covid, a mask is required upon entering the hospital/clinic. If you do not have a mask, one will be given to you upon arrival. For doctor visits, patients may have 1 support person aged 20 or older with them. For treatment visits, patients cannot have anyone with them due to current Covid guidelines and our immunocompromised population.

## 2021-01-23 NOTE — Progress Notes (Signed)
Patient presented with low pulse oximeter. He denies cough or dyspnea. Sandi Mealy, PA-C notified. 2V CXR ordered and completed. Sandi Mealy came to infusion room to evaluate patient and discuss findings. No additional orders received.

## 2021-01-24 ENCOUNTER — Other Ambulatory Visit: Payer: Self-pay

## 2021-01-24 ENCOUNTER — Inpatient Hospital Stay: Payer: PPO

## 2021-01-24 VITALS — BP 120/69 | HR 71 | Temp 97.9°F | Resp 18

## 2021-01-24 DIAGNOSIS — Z5112 Encounter for antineoplastic immunotherapy: Secondary | ICD-10-CM | POA: Diagnosis not present

## 2021-01-24 DIAGNOSIS — C3491 Malignant neoplasm of unspecified part of right bronchus or lung: Secondary | ICD-10-CM

## 2021-01-24 LAB — URINE CULTURE: Culture: <10000 — AB

## 2021-01-24 MED ORDER — TRILACICLIB DIHYDROCHLORIDE INJECTION 300 MG
240.0000 mg/m2 | Freq: Once | INTRAVENOUS | Status: AC
  Administered 2021-01-24: 465 mg via INTRAVENOUS
  Filled 2021-01-24: qty 31

## 2021-01-24 MED ORDER — SODIUM CHLORIDE 0.9 % IV SOLN
Freq: Once | INTRAVENOUS | Status: AC
  Filled 2021-01-24: qty 250

## 2021-01-24 MED ORDER — SODIUM CHLORIDE 0.9% FLUSH
10.0000 mL | INTRAVENOUS | Status: DC | PRN
Start: 2021-01-24 — End: 2021-01-24
  Administered 2021-01-24: 10 mL
  Filled 2021-01-24: qty 10

## 2021-01-24 MED ORDER — HEPARIN SOD (PORK) LOCK FLUSH 100 UNIT/ML IV SOLN
500.0000 [IU] | Freq: Once | INTRAVENOUS | Status: AC | PRN
  Administered 2021-01-24: 500 [IU]
  Filled 2021-01-24: qty 5

## 2021-01-24 MED ORDER — DEXAMETHASONE SODIUM PHOSPHATE 100 MG/10ML IJ SOLN
10.0000 mg | Freq: Once | INTRAMUSCULAR | Status: AC
  Administered 2021-01-24: 10 mg via INTRAVENOUS
  Filled 2021-01-24: qty 10

## 2021-01-24 MED ORDER — SODIUM CHLORIDE 0.9 % IV SOLN
100.0000 mg/m2 | Freq: Once | INTRAVENOUS | Status: AC
  Administered 2021-01-24: 200 mg via INTRAVENOUS
  Filled 2021-01-24: qty 10

## 2021-01-24 NOTE — Patient Instructions (Signed)
[facility]  Discharge Instructions: Thank you for choosing [facility] to provide your oncology and hematology care.   If you have a lab appointment with the [facility], please go directly to the [facility] and check in at the registration area.   Wear comfortable clothing and clothing appropriate for easy access to any Portacath or PICC line.   We strive to give you quality time with your provider. You may need to reschedule your appointment if you arrive late (15 or more minutes).  Arriving late affects you and other patients whose appointments are after yours.  Also, if you miss three or more appointments without notifying the office, you may be dismissed from the clinic at the provider's discretion.      For prescription refill requests, have your pharmacy contact our office and allow 72 hours for refills to be completed.    Today you received the following chemotherapy and/or immunotherapy agents: etoposide.     To help prevent nausea and vomiting after your treatment, we encourage you to take your nausea medication as directed.  BELOW ARE SYMPTOMS THAT SHOULD BE REPORTED IMMEDIATELY: . *FEVER GREATER THAN 100.4 F (38 C) OR HIGHER . *CHILLS OR SWEATING . *NAUSEA AND VOMITING THAT IS NOT CONTROLLED WITH YOUR NAUSEA MEDICATION . *UNUSUAL SHORTNESS OF BREATH . *UNUSUAL BRUISING OR BLEEDING . *URINARY PROBLEMS (pain or burning when urinating, or frequent urination) . *BOWEL PROBLEMS (unusual diarrhea, constipation, pain near the anus) . TENDERNESS IN MOUTH AND THROAT WITH OR WITHOUT PRESENCE OF ULCERS (sore throat, sores in mouth, or a toothache) . UNUSUAL RASH, SWELLING OR PAIN  . UNUSUAL VAGINAL DISCHARGE OR ITCHING   Items with * indicate a potential emergency and should be followed up as soon as possible or go to the Emergency Department if any problems should occur.  Please show the CHEMOTHERAPY ALERT CARD or IMMUNOTHERAPY ALERT  CARD at check-in to the Emergency Department and triage nurse.  Should you have questions after your visit or need to cancel or reschedule your appointment, please contact [facility]  Dept: [phone]  and follow the prompts.  Office hours are 8:00 a.m. to 4:30 p.m. Monday - Friday. Please note that voicemails left after 4:00 p.m. may not be returned until the following business day.  We are closed weekends and major holidays. You have access to a nurse at all times for urgent questions. Please call the main number to the clinic Dept: [phone] and follow the prompts.   For any non-urgent questions, you may also contact your provider using MyChart. We now offer e-Visits for anyone 37 and older to request care online for non-urgent symptoms. For details visit [email or web address].   Also download the MyChart app! Go to the app store, search "MyChart", open the app, select [facility], and log in with your MyChart username and password.  Due to Covid, a mask is required upon entering the hospital/clinic. If you do not have a mask, one will be given to you upon arrival. For doctor visits, patients may have 1 support person aged 18 or older with them. For treatment visits, patients cannot have anyone with them due to current Covid guidelines and our immunocompromised population.

## 2021-01-27 ENCOUNTER — Other Ambulatory Visit: Payer: Self-pay

## 2021-01-27 ENCOUNTER — Inpatient Hospital Stay: Payer: PPO | Attending: Neurological Surgery

## 2021-01-27 VITALS — BP 91/56 | HR 69 | Temp 98.6°F | Resp 18

## 2021-01-27 DIAGNOSIS — Z5189 Encounter for other specified aftercare: Secondary | ICD-10-CM | POA: Insufficient documentation

## 2021-01-27 DIAGNOSIS — Z79899 Other long term (current) drug therapy: Secondary | ICD-10-CM | POA: Insufficient documentation

## 2021-01-27 DIAGNOSIS — Z7982 Long term (current) use of aspirin: Secondary | ICD-10-CM | POA: Diagnosis not present

## 2021-01-27 DIAGNOSIS — C7931 Secondary malignant neoplasm of brain: Secondary | ICD-10-CM | POA: Diagnosis not present

## 2021-01-27 DIAGNOSIS — F17210 Nicotine dependence, cigarettes, uncomplicated: Secondary | ICD-10-CM | POA: Insufficient documentation

## 2021-01-27 DIAGNOSIS — I959 Hypotension, unspecified: Secondary | ICD-10-CM | POA: Insufficient documentation

## 2021-01-27 DIAGNOSIS — C7971 Secondary malignant neoplasm of right adrenal gland: Secondary | ICD-10-CM | POA: Diagnosis not present

## 2021-01-27 DIAGNOSIS — C7951 Secondary malignant neoplasm of bone: Secondary | ICD-10-CM | POA: Diagnosis not present

## 2021-01-27 DIAGNOSIS — C3491 Malignant neoplasm of unspecified part of right bronchus or lung: Secondary | ICD-10-CM | POA: Diagnosis not present

## 2021-01-27 DIAGNOSIS — C7972 Secondary malignant neoplasm of left adrenal gland: Secondary | ICD-10-CM | POA: Diagnosis not present

## 2021-01-27 DIAGNOSIS — Z5111 Encounter for antineoplastic chemotherapy: Secondary | ICD-10-CM | POA: Diagnosis not present

## 2021-01-27 MED ORDER — PEGFILGRASTIM-CBQV 6 MG/0.6ML ~~LOC~~ SOSY
6.0000 mg | PREFILLED_SYRINGE | Freq: Once | SUBCUTANEOUS | Status: AC
  Administered 2021-01-27: 6 mg via SUBCUTANEOUS

## 2021-01-27 MED ORDER — PEGFILGRASTIM-CBQV 6 MG/0.6ML ~~LOC~~ SOSY
PREFILLED_SYRINGE | SUBCUTANEOUS | Status: AC
  Filled 2021-01-27: qty 0.6

## 2021-01-27 NOTE — Patient Instructions (Signed)
Pegfilgrastim injection What is this medicine? PEGFILGRASTIM (PEG fil gra stim) is a long-acting granulocyte colony-stimulating factor that stimulates the growth of neutrophils, a type of white blood cell important in the body's fight against infection. It is used to reduce the incidence of fever and infection in patients with certain types of cancer who are receiving chemotherapy that affects the bone marrow, and to increase survival after being exposed to high doses of radiation. This medicine may be used for other purposes; ask your health care provider or pharmacist if you have questions. COMMON BRAND NAME(S): Rexene Edison, Ziextenzo What should I tell my health care provider before I take this medicine? They need to know if you have any of these conditions:  kidney disease  latex allergy  ongoing radiation therapy  sickle cell disease  skin reactions to acrylic adhesives (On-Body Injector only)  an unusual or allergic reaction to pegfilgrastim, filgrastim, other medicines, foods, dyes, or preservatives  pregnant or trying to get pregnant  breast-feeding How should I use this medicine? This medicine is for injection under the skin. If you get this medicine at home, you will be taught how to prepare and give the pre-filled syringe or how to use the On-body Injector. Refer to the patient Instructions for Use for detailed instructions. Use exactly as directed. Tell your healthcare provider immediately if you suspect that the On-body Injector may not have performed as intended or if you suspect the use of the On-body Injector resulted in a missed or partial dose. It is important that you put your used needles and syringes in a special sharps container. Do not put them in a trash can. If you do not have a sharps container, call your pharmacist or healthcare provider to get one. Talk to your pediatrician regarding the use of this medicine in children. While this drug  may be prescribed for selected conditions, precautions do apply. Overdosage: If you think you have taken too much of this medicine contact a poison control center or emergency room at once. NOTE: This medicine is only for you. Do not share this medicine with others. What if I miss a dose? It is important not to miss your dose. Call your doctor or health care professional if you miss your dose. If you miss a dose due to an On-body Injector failure or leakage, a new dose should be administered as soon as possible using a single prefilled syringe for manual use. What may interact with this medicine? Interactions have not been studied. This list may not describe all possible interactions. Give your health care provider a list of all the medicines, herbs, non-prescription drugs, or dietary supplements you use. Also tell them if you smoke, drink alcohol, or use illegal drugs. Some items may interact with your medicine. What should I watch for while using this medicine? Your condition will be monitored carefully while you are receiving this medicine. You may need blood work done while you are taking this medicine. Talk to your health care provider about your risk of cancer. You may be more at risk for certain types of cancer if you take this medicine. If you are going to need a MRI, CT scan, or other procedure, tell your doctor that you are using this medicine (On-Body Injector only). What side effects may I notice from receiving this medicine? Side effects that you should report to your doctor or health care professional as soon as possible:  allergic reactions (skin rash, itching or hives, swelling of  the face, lips, or tongue)  back pain  dizziness  fever  pain, redness, or irritation at site where injected  pinpoint red spots on the skin  red or dark-brown urine  shortness of breath or breathing problems  stomach or side pain, or pain at the shoulder  swelling  tiredness  trouble  passing urine or change in the amount of urine  unusual bruising or bleeding Side effects that usually do not require medical attention (report to your doctor or health care professional if they continue or are bothersome):  bone pain  muscle pain This list may not describe all possible side effects. Call your doctor for medical advice about side effects. You may report side effects to FDA at 1-800-FDA-1088. Where should I keep my medicine? Keep out of the reach of children. If you are using this medicine at home, you will be instructed on how to store it. Throw away any unused medicine after the expiration date on the label. NOTE: This sheet is a summary. It may not cover all possible information. If you have questions about this medicine, talk to your doctor, pharmacist, or health care provider.  2021 Elsevier/Gold Standard (2019-10-06 13:20:51)

## 2021-01-28 ENCOUNTER — Inpatient Hospital Stay: Payer: PPO

## 2021-01-28 DIAGNOSIS — C3491 Malignant neoplasm of unspecified part of right bronchus or lung: Secondary | ICD-10-CM

## 2021-01-28 DIAGNOSIS — Z5111 Encounter for antineoplastic chemotherapy: Secondary | ICD-10-CM | POA: Diagnosis not present

## 2021-01-28 LAB — CMP (CANCER CENTER ONLY)
ALT: <6 U/L (ref 0–44)
AST: 9 U/L — ABNORMAL LOW (ref 15–41)
Albumin: 3.2 g/dL — ABNORMAL LOW (ref 3.5–5.0)
Alkaline Phosphatase: 82 U/L (ref 38–126)
Anion gap: 6 (ref 5–15)
BUN: 23 mg/dL (ref 8–23)
CO2: 28 mmol/L (ref 22–32)
Calcium: 8.7 mg/dL — ABNORMAL LOW (ref 8.9–10.3)
Chloride: 104 mmol/L (ref 98–111)
Creatinine: 0.94 mg/dL (ref 0.61–1.24)
GFR, Estimated: >60 mL/min (ref >60)
Glucose, Bld: 83 mg/dL (ref 70–99)
Potassium: 4.7 mmol/L (ref 3.5–5.1)
Sodium: 138 mmol/L (ref 135–145)
Total Bilirubin: 0.4 mg/dL (ref 0.3–1.2)
Total Protein: 5.9 g/dL — ABNORMAL LOW (ref 6.5–8.1)

## 2021-01-28 LAB — CBC WITH DIFFERENTIAL (CANCER CENTER ONLY)
Abs Immature Granulocytes: 0 10*3/uL (ref 0.00–0.07)
Basophils Absolute: 0.2 10*3/uL — ABNORMAL HIGH (ref 0.0–0.1)
Basophils Relative: 1 %
Eosinophils Absolute: 0 10*3/uL (ref 0.0–0.5)
Eosinophils Relative: 0 %
HCT: 39.7 % (ref 39.0–52.0)
Hemoglobin: 13 g/dL (ref 13.0–17.0)
Lymphocytes Relative: 0 %
Lymphs Abs: 0 10*3/uL — ABNORMAL LOW (ref 0.7–4.0)
MCH: 30.7 pg (ref 26.0–34.0)
MCHC: 32.7 g/dL (ref 30.0–36.0)
MCV: 93.9 fL (ref 80.0–100.0)
Monocytes Absolute: 0 10*3/uL — ABNORMAL LOW (ref 0.1–1.0)
Monocytes Relative: 0 %
Neutro Abs: 22.2 10*3/uL — ABNORMAL HIGH (ref 1.7–7.7)
Neutrophils Relative %: 99 %
Platelet Count: 108 10*3/uL — ABNORMAL LOW (ref 150–400)
RBC: 4.23 MIL/uL (ref 4.22–5.81)
RDW: 14.3 % (ref 11.5–15.5)
WBC Count: 22.4 10*3/uL — ABNORMAL HIGH (ref 4.0–10.5)
nRBC: 0 % (ref 0.0–0.2)

## 2021-01-28 LAB — TSH: TSH: 1.067 u[IU]/mL (ref 0.350–4.500)

## 2021-01-30 ENCOUNTER — Telehealth: Payer: Self-pay | Admitting: Medical Oncology

## 2021-01-30 NOTE — Telephone Encounter (Addendum)
01/29/2021 Voicemail.  " Shirley Muscat daughter Lathaniel Legate [phone]).  Calling to check location and status of my FMLA form due very soon.  Employer '[employer] will close this case and I will have to start over if everything is not received.  Dad may have given form to provider later than I expected but he also said process takes 7 to ten days."  01/30/2021 Received voicemail forwarded from collaborative.  DAIVION PAPE [phone]) requesting status check from provider's PA of FMLA Leave form for daughter.  Connected with patient, provided message information left for daughter.     Form delivered to this nurse 01/27/2021 presently in queue.  Allow 7-10 business days or (14 calendar) to process.  Asked type of leave, start date, employer fax and/or e-mail for return.  Advised to restart process.  Forms completed in order received with 24 forms ahead.

## 2021-01-30 NOTE — Telephone Encounter (Signed)
Pt called to f/u on FMLA papers for his daughter . Message forwarded to [place].

## 2021-02-03 ENCOUNTER — Ambulatory Visit
Admission: RE | Admit: 2021-02-03 | Discharge: 2021-02-03 | Disposition: A | Discharge status: Home / Self Care | Payer: PPO | Source: Ambulatory Visit | Attending: Radiation Oncology | Admitting: Radiation Oncology

## 2021-02-03 DIAGNOSIS — C3491 Malignant neoplasm of unspecified part of right bronchus or lung: Secondary | ICD-10-CM

## 2021-02-03 NOTE — Progress Notes (Signed)
  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  Interval Since Last Radiation: 3 weeks   12/30/2020 through 01/16/2021 Site Technique Total Dose (Gy) Dose per Fx (Gy) Completed Fx Beam Energies  Hip, Right: Pelvis_Rt Complex 35/35 2.5 14/14 6X, 10X  Ribs, Right: Chest_Rt_Rt 7 Complex 35/35 2.5 14/14 10X    Narrative:  The patient was contacted today for routine follow-up. During treatment he did very well with radiotherapy and did not have significant desquamation. He reports he is doing pretty well. He did have fatigue during radiation, but has pain has improved.   Impression/Plan: 1. 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 The patient has been doing well since completion of radiotherapy. We discussed that he will continue to follow up with Dr. Julien Nordmann in medical oncology and is scheduled for repeat scans next Monday. We also discussed the rationale for whole brain radiation and that we will work with medical oncology to review his scans next week and next steps for proceeding with radiation.     Carola Rhine, PAC

## 2021-02-04 ENCOUNTER — Other Ambulatory Visit: Payer: PPO

## 2021-02-04 ENCOUNTER — Inpatient Hospital Stay: Payer: PPO

## 2021-02-04 ENCOUNTER — Other Ambulatory Visit: Payer: Self-pay

## 2021-02-04 DIAGNOSIS — Z5111 Encounter for antineoplastic chemotherapy: Secondary | ICD-10-CM | POA: Diagnosis not present

## 2021-02-04 DIAGNOSIS — C3491 Malignant neoplasm of unspecified part of right bronchus or lung: Secondary | ICD-10-CM

## 2021-02-04 LAB — CMP (CANCER CENTER ONLY)
ALT: <6 U/L (ref 0–44)
AST: 9 U/L — ABNORMAL LOW (ref 15–41)
Albumin: 3.1 g/dL — ABNORMAL LOW (ref 3.5–5.0)
Alkaline Phosphatase: 113 U/L (ref 38–126)
Anion gap: 9 (ref 5–15)
BUN: 17 mg/dL (ref 8–23)
CO2: 24 mmol/L (ref 22–32)
Calcium: 8.4 mg/dL — ABNORMAL LOW (ref 8.9–10.3)
Chloride: 109 mmol/L (ref 98–111)
Creatinine: 1.01 mg/dL (ref 0.61–1.24)
GFR, Estimated: >60 mL/min (ref >60)
Glucose, Bld: 105 mg/dL — ABNORMAL HIGH (ref 70–99)
Potassium: 4.1 mmol/L (ref 3.5–5.1)
Sodium: 142 mmol/L (ref 135–145)
Total Bilirubin: <0.2 mg/dL — ABNORMAL LOW (ref 0.3–1.2)
Total Protein: 5.7 g/dL — ABNORMAL LOW (ref 6.5–8.1)

## 2021-02-04 LAB — CBC WITH DIFFERENTIAL (CANCER CENTER ONLY)
Abs Immature Granulocytes: 0.59 10*3/uL — ABNORMAL HIGH (ref 0.00–0.07)
Basophils Absolute: 0.1 10*3/uL (ref 0.0–0.1)
Basophils Relative: 1 %
Eosinophils Absolute: 0 10*3/uL (ref 0.0–0.5)
Eosinophils Relative: 0 %
HCT: 37.2 % — ABNORMAL LOW (ref 39.0–52.0)
Hemoglobin: 12 g/dL — ABNORMAL LOW (ref 13.0–17.0)
Immature Granulocytes: 4 %
Lymphocytes Relative: 2 %
Lymphs Abs: 0.3 10*3/uL — ABNORMAL LOW (ref 0.7–4.0)
MCH: 30.8 pg (ref 26.0–34.0)
MCHC: 32.3 g/dL (ref 30.0–36.0)
MCV: 95.4 fL (ref 80.0–100.0)
Monocytes Absolute: 1.4 10*3/uL — ABNORMAL HIGH (ref 0.1–1.0)
Monocytes Relative: 11 %
Neutro Abs: 11.1 10*3/uL — ABNORMAL HIGH (ref 1.7–7.7)
Neutrophils Relative %: 82 %
Platelet Count: 42 10*3/uL — ABNORMAL LOW (ref 150–400)
RBC: 3.9 MIL/uL — ABNORMAL LOW (ref 4.22–5.81)
RDW: 14.8 % (ref 11.5–15.5)
WBC Count: 13.5 10*3/uL — ABNORMAL HIGH (ref 4.0–10.5)
nRBC: 0.3 % — ABNORMAL HIGH (ref 0.0–0.2)

## 2021-02-04 LAB — TSH: TSH: 1.064 u[IU]/mL (ref 0.320–4.118)

## 2021-02-05 ENCOUNTER — Ambulatory Visit: Payer: PPO

## 2021-02-05 ENCOUNTER — Other Ambulatory Visit: Payer: PPO

## 2021-02-05 ENCOUNTER — Ambulatory Visit: Payer: PPO | Admitting: Physician Assistant

## 2021-02-06 ENCOUNTER — Ambulatory Visit: Payer: PPO

## 2021-02-07 ENCOUNTER — Ambulatory Visit: Payer: PPO

## 2021-02-08 NOTE — Progress Notes (Signed)
[facility] OFFICE PROGRESS NOTE  [name], MD [address] [place] [address]  DIAGNOSIS: Extensive stage (T2 a, N2, M1 C) small cell lung cancer presented with right hilar mass in addition to mediastinal lymphadenopathy as well as metastatic disease to the bone with complete replacement of the L5 status post laminectomy with resection of epidural tumor as well as metastatic disease to thebrain, bones and bilateraladrenal glandsdiagnosed in January 2022.  PRIOR THERAPY: 1) Status post palliative radiotherapy to the resected area at L5 under the care of Dr. Lisbeth Renshaw. 2)palliative radiotherapy to the right hip under the care of Dr. Lisbeth Renshaw last treatment on 01/09/2021.  CURRENT THERAPY: 1)Systemic chemotherapy with carboplatin for AUC of 5 from day 1, etoposide 100 mg/M2 on days 1, 2 and 3 with Cosela 240 mg/M2 before chemotherapy in addition to Imfinzi 1500 mg IV every 3 weeks with day one of the chemotherapy. First doseApril 4th, 2022. Neulasta support was added starting with cycle #2. Status post 2 cycles   INTERVAL HISTORY: Gregory Hodges 80 y.o. male returns to the clinic today for a follow up visit. The patient is feeling fair today without any concerning complaints. The patient was found to have multiple metastatic brain lesions upon his staging work-up.  The plan was to complete 2 cycles of systemic chemotherapy before proceeding with whole brain radiation. He had a repeat brain MRI yesterday which shows significant improvement in 6 metastatic deposits in the brain which are smaller or no longer visible.  Otherwise, he tolerated his last cycle well. Neulasta was added from cycle #2 due to neutropenia with cycle #1. Otherwise the patient denies any recent fever or night sweats. He lost several pounds since his last appointment but states he is eating well and has an appetite. He denies any significant shortness of breath with exertion, chest pain,  or hemoptysis.  He reports his baseline cough which is unchanged.  He denies any nausea and vomiting. He denies constipation or diarrhea. He recently had a restaging CT scan of the chest, abdomen, and pelvis. He is here for evaluation and to review his scan results before starting cycle #3.     MEDICAL HISTORY: Past Medical History:  Diagnosis Date  . AAA (abdominal aortic aneurysm) (HCC)    s/p open repair using aortobifemoral graft 03/03/10 ([facility]), complicated by wound dehisence, enterocutaneous fistula; developed aortic graft infection s/p explant of graft and placement of bilateral axillofemoral grafts 07/22/10 [facility])  . Cancer (HCC)    Skin  . Crohn's disease ([place])   . E coli bacteremia   . History of kidney stones   . Hyperlipemia   . Hypertension    "denies"  . Myocardial infarction ([place]) 08/11/2005   s/p Horizon study stent D1  . Peripheral vascular disease [facility]) June 2011  . SCLC (small cell lung carcinoma) ([place]) dx'd 10/2020  . 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  MEDICATIONS:  Current Outpatient Medications  Medication Sig Dispense Refill  . acetaminophen (TYLENOL) 500 MG tablet Take 500 mg by mouth every 6 (six) hours as needed for moderate pain.    Marland Kitchen aspirin 81 MG tablet Take 81 mg by mouth daily.    Marland Kitchen atenolol (TENORMIN) 25 MG tablet Take 25 mg by mouth 2 (two) times daily.    . calcium carbonate (TUMS - DOSED IN MG ELEMENTAL CALCIUM) 500 MG chewable tablet Chew 2 tablets by mouth daily  as needed for indigestion or heartburn.    . diphenhydrAMINE (BENADRYL) 25 MG tablet Take 50 mg by mouth daily as needed for allergies.    . ferrous sulfate 325 (65 FE) MG tablet Take 325 mg by mouth every Monday, Wednesday, and Friday.    . lidocaine-prilocaine (EMLA) cream Apply to the Port-A-Cath site 30-60 minutes before treatment 30 g 0  . nitroGLYCERIN (NITROSTAT) 0.4 MG SL tablet Place 0.4 mg under the  tongue every 5 (five) minutes as needed for chest pain.    Marland Kitchen omeprazole (PRILOSEC) 10 MG capsule Take 10 mg by mouth daily.    Marland Kitchen oxymetazoline (AFRIN) 0.05 % nasal spray Place 1 spray into both nostrils 2 (two) times daily as needed for congestion.    . prochlorperazine (COMPAZINE) 10 MG tablet Take 1 tablet (10 mg total) by mouth every 6 (six) hours as needed for nausea or vomiting. 30 tablet 0  . rosuvastatin (CRESTOR) 10 MG tablet Take 10 mg by mouth daily.    Marland Kitchen sulfamethoxazole-trimethoprim (BACTRIM DS) 800-160 MG per tablet Take 1 tablet by mouth 2 (two) times daily. 800-160 mg 60 tablet 11   No current facility-administered medications for this visit.    SURGICAL HISTORY:  Past Surgical History:  Procedure Laterality Date  . ABDOMINAL AORTIC ANEURYSM REPAIR  03/03/2010  . AXILLARY-FEMORAL BYPASS GRAFT  07/22/2010   bilateral  . bowel     herniated bowel  . CARDIAC CATHETERIZATION    . CORONARY STENT PLACEMENT    . CYSTOSCOPY    . IR IMAGING GUIDED PORT INSERTION  12/27/2020  . LAMINECTOMY N/A 10/22/2020   Procedure: Lumbar five Open Laminectomy for tumor resection;  Surgeon: Judith Part, MD;  Location: [facility];  Service: Neurosurgery;  Laterality: N/A;  . MOHS SURGERY     several     REVIEW OF SYSTEMS:   Review of Systems  Constitutional: Negative for appetite change, chills, fatigue, fever and unexpected weight change.  HENT: Negative for mouth sores, nosebleeds, sore throat and trouble swallowing.   Eyes: Negative for eye problems and icterus.  Respiratory: Positive for baseline cough. Negative for hemoptysis, shortness of breath and wheezing.   Cardiovascular: Negative for chest pain and leg swelling.  Gastrointestinal: Negative for abdominal pain, constipation, diarrhea, nausea and vomiting.  Genitourinary: Negative for bladder incontinence, difficulty urinating, dysuria, frequency and hematuria.   Musculoskeletal: Negative for back pain, gait problem, neck pain and  neck stiffness.  Skin: Negative for itching and rash.  Neurological: Negative for dizziness, extremity weakness, gait problem, headaches, light-headedness and seizures.  Hematological: Negative for adenopathy. Does not bruise/bleed easily.  Psychiatric/Behavioral: Negative for confusion, depression and sleep disturbance. The patient is not nervous/anxious.     PHYSICAL EXAMINATION:  Blood pressure (!) 91/53, pulse 62, temperature 97.8 F (36.6 C), temperature source Tympanic, resp. rate 17, height 5' 9"  (1.753 m), weight 161 lb 3.2 oz (73.1 kg), SpO2 93 %.  ECOG PERFORMANCE STATUS: 1 - Symptomatic but completely ambulatory  Physical Exam  Constitutional: Oriented to person, place, and time and well-developed, well-nourished, and in no distress.  HENT:  Head: Normocephalic and atraumatic.  Mouth/Throat: Oropharynx is clear and moist. No oropharyngeal exudate.  Eyes: Conjunctivae are normal. Right eye exhibits no discharge. Left eye exhibits no discharge. No scleral icterus.  Neck: Normal range of motion. Neck supple.  Cardiovascular: Normal rate, regular rhythm, normal heart sounds and intact distal pulses.   Pulmonary/Chest: Effort normal and breath sounds normal. Mild wheezing noted. No respiratory  distress. No rales.  Abdominal: Soft. Large hernia. Bowel sounds are normal. Exhibits no distension and no mass. There is no tenderness.  Musculoskeletal: Normal range of motion. Exhibits no edema.  Lymphadenopathy:    No cervical adenopathy.  Neurological: Alert and oriented to person, place, and time. Exhibits muscle wasting.  Examined in the wheelchair.  Skin: Skin is warm and dry. No rash noted. Not diaphoretic. No erythema. No pallor.  Psychiatric: Mood, memory and judgment normal.  Vitals reviewed.  LABORATORY DATA: Lab Results  Component Value Date   WBC 13.1 (H) 02/11/2021   HGB 12.2 (L) 02/11/2021   HCT 38.2 (L) 02/11/2021   MCV 96.2 02/11/2021   PLT 195 02/11/2021       Chemistry      Component Value Date/Time   NA 138 02/11/2021 0740   K 4.3 02/11/2021 0740   CL 106 02/11/2021 0740   CO2 23 02/11/2021 0740   BUN 17 02/11/2021 0740   CREATININE 1.01 02/11/2021 0740      Component Value Date/Time   CALCIUM 8.9 02/11/2021 0740   ALKPHOS 118 02/11/2021 0740   AST 10 (L) 02/11/2021 0740   ALT <6 02/11/2021 0740   BILITOT 0.3 02/11/2021 0740       RADIOGRAPHIC STUDIES:  DG Chest 2 View  Result Date: 01/23/2021 CLINICAL DATA:  Shortness of breath. EXAM: CHEST - 2 VIEW COMPARISON:  PET-CT 12/19/2020. CT 10/16/2020. Chest x-ray 07/28/2010. FINDINGS: PowerPort catheter noted with tip over SVC. Stable cardiomegaly. No pulmonary venous congestion. Right lung nodule best identified by prior studies. No acute infiltrate. No pleural effusion or pneumothorax. Bony lesions best identified by prior studies. IMPRESSION: 1.  PowerPort catheter noted with tip over SVC. 2. Right lung lesion best identified by prior size. Bony lesions best identified by prior studies. 3.  Stable cardiomegaly.  No pulmonary venous congestion. 4.  Hiatal hernia again noted. Electronically Signed   By: Marcello Moores  Register   On: 01/23/2021 15:37   MR Brain W Wo Contrast  Result Date: 02/11/2021 CLINICAL DATA:  Small cell lung cancer. Follow-up treated metastatic disease to brain. EXAM: MRI HEAD WITHOUT AND WITH CONTRAST TECHNIQUE: Multiplanar, multiecho pulse sequences of the brain and surrounding structures were obtained without and with intravenous contrast. CONTRAST:  7.5m GADAVIST GADOBUTROL 1 MMOL/ML IV SOLN COMPARISON:  MRI head with contrast 12/17/2020 FINDINGS: Brain: Interval improvement in multiple enhancing lesions compatible with treated metastatic disease. 5 mm enhancing lesion right lower cerebellum significantly smaller. Additional lesions in the right lateral cerebellum and left inferior cerebellum no longer visualized. Enhancing lesion left lateral temporal lobe is significantly  smaller although shows interval hemorrhage. Left frontal enhancing lesion significantly smaller Second lesion in the anterior left frontal lobe significantly smaller and difficult to identify. Small amount of associated hemorrhage No new enhancing lesions. Ventricle size normal. Moderate to extensive changes throughout the white matter and pons similar to the prior study. No acute infarct. Vascular: Normal arterial flow voids. Skull and upper cervical spine: No focal skeletal lesion identified. Sinuses/Orbits: Negative Other: None IMPRESSION: Significant improvement in 6 metastatic deposits in the brain which are smaller or no longer visible. Mild interval hemorrhage in the left lateral temporal lesion. No new lesions. Moderate to extensive changes in the white matter and pons compatible with chronic microvascular ischemia. Electronically Signed   By: CFranchot GalloM.D.   On: 02/11/2021 08:44     ASSESSMENT/PLAN:  This is a very pleasant 80year old Caucasian male diagnosed with extensive stage(T2a, N2, M1c)small  cell lung cancer. He presented with a right upper lobe lung lesion with extensive mediastinal and right hilar lymphadenopathy. He also has extensive bone metastases as well as metastatic disease to the adrenal gland bilaterally and multiple brain lesions. He was diagnosed in February 2022.   He is status post resection of the epidural tumor at L5.He also completed palliative radiotherapy to this lesion after resection.  The patient also completed palliative radiotherapy to the painful right hip lesion in April 2022.   The patient was found to have metastatic disease to the brain. Per Dr. Julien Nordmann, the plan is to undergo 2 cycles of palliative systemic chemotherapy before proceeding with whole brain radiation. His brain MRI was repeated on 02/10/21. I have forwarded these results to radiation oncology. Dr. Julien Nordmann reviewed the results with the patient today. Since there was significant  improvement in his metastatic brain lesions and the patient is asymptomatic, Dr. Julien Nordmann would like to complete 4 cycles of chemotherapy before proceeding with whole brain radiation.   The patient is currently undergoing palliative systemic chemotherapy with carboplatin for an AUC of 5, etoposide 100 mg per metered squared on days 1, 2, and 3 and immunotherapy with Imfinzi 1500 mg IV every 3 weeks withCosela.He is status post two cycles and tolerated it well without any noticeable adverse side effects except for pancytopenia. We added neulasta from cycle #2.  The patient recently had a restaging CT scan performed. Dr. Julien Nordmann personally and independently reviewed the scan; however, the final radiology over-read is not available at this time. It appears there is improvement and the patent will proceed with cycle #3 today as scheduled. We will let the patient know the results once available and if there are any new or concerning findings.     Recommend he proceed with cycle #3 today as scheduled.  We will see him back for a follow up visit in 3 weeks for evaluation before starting cycle #4   He has hypotension. He is not on any anti-hypertensive's presently to have contributed to this. We will arrange for him to receive 1 L of normal saline today while in the infusion room.   The patient was advised to call immediately if he has any concerning symptoms in the interval. The patient voices understanding of current disease status and treatment options and is in agreement with the current care plan. All questions were answered. The patient knows to call the clinic with any problems, questions or concerns. We can certainly see the patient much sooner if necessary   No orders of the defined types were placed in this encounter.     Breella Vanostrand L Monik Lins, PA-C 02/11/21  ADDENDUM: Hematology/Oncology Attending: I had a face-to-face encounter with the patient today.  I reviewed his record, scans  and recommended his care plan.  This is a pleasant 80 years old white male recently diagnosed with extensive stage small cell lung cancer in February 2022 status post resection of epidural tumor at L5 followed by radiotherapy to this lesion and the patient is currently undergoing palliative systemic chemotherapy with carboplatin for AUC of 5 on day 1, etoposide 100 Mg/M2 on days 1, 2 and 3 with Imfinzi 1500 Mg on day 1 and Cosela before the chemotherapy. The patient has been tolerating this treatment fairly well.  He is status post 2 cycles.  He had MRI of the brain as well as CT scan of the chest, abdomen pelvis performed yesterday.  The MRI of the brain showed significant improvement in  his disease.  The final report for the CT scan of the chest, abdomen and pelvis is still pending but reviewing the images showed some improvement of his disease.  I will wait for the final report for further recommendation. I recommended for the patient to proceed with cycle #3 of his treatment today as planned. Regarding the radiation to the brain, we will hold on this for now until completion of the chemotherapy with cycle #4. The patient will come back for follow-up visit in 3 weeks for evaluation before starting cycle #4 of his treatment. He was advised to call immediately if he has any concerning symptoms in the interval. The total time spent in the appointment was 35 minutes. Disclaimer: This note was dictated with voice recognition software. Similar sounding words can inadvertently be transcribed and may be missed upon review. Eilleen Kempf, MD 02/11/21

## 2021-02-10 ENCOUNTER — Ambulatory Visit (HOSPITAL_COMMUNITY)
Admission: RE | Admit: 2021-02-10 | Discharge: 2021-02-10 | Disposition: A | Discharge status: Home / Self Care | Payer: PPO | Source: Ambulatory Visit | Attending: Physician Assistant | Admitting: Physician Assistant

## 2021-02-10 ENCOUNTER — Encounter (HOSPITAL_COMMUNITY): Payer: Self-pay

## 2021-02-10 ENCOUNTER — Other Ambulatory Visit: Payer: Self-pay

## 2021-02-10 DIAGNOSIS — C3490 Malignant neoplasm of unspecified part of unspecified bronchus or lung: Secondary | ICD-10-CM | POA: Diagnosis not present

## 2021-02-10 DIAGNOSIS — R634 Abnormal weight loss: Secondary | ICD-10-CM | POA: Diagnosis not present

## 2021-02-10 DIAGNOSIS — C419 Malignant neoplasm of bone and articular cartilage, unspecified: Secondary | ICD-10-CM | POA: Diagnosis not present

## 2021-02-10 DIAGNOSIS — G9389 Other specified disorders of brain: Secondary | ICD-10-CM | POA: Diagnosis not present

## 2021-02-10 DIAGNOSIS — S2231XA Fracture of one rib, right side, initial encounter for closed fracture: Secondary | ICD-10-CM | POA: Diagnosis not present

## 2021-02-10 DIAGNOSIS — C7951 Secondary malignant neoplasm of bone: Secondary | ICD-10-CM | POA: Diagnosis not present

## 2021-02-10 DIAGNOSIS — K449 Diaphragmatic hernia without obstruction or gangrene: Secondary | ICD-10-CM | POA: Diagnosis not present

## 2021-02-10 HISTORY — DX: Malignant neoplasm of unspecified part of unspecified bronchus or lung: C34.90

## 2021-02-10 IMAGING — CT CT ABD-PELV W/ CM
3 of 5 series · 14 of 36 positions shown, 17 images · IV contrast (OMNIPAQUE)
Comparison: CT [DATE], PET-CT [DATE]

CLINICAL DATA: SCL Ca to bone, adrenal gland and lung; chemo and
immunotherpy ongoing; XRT ongoing; wt loss; decreased appetite

EXAM:
CT CHEST, ABDOMEN, AND PELVIS WITH CONTRAST
TECHNIQUE: Multidetector CT imaging of the chest, abdomen and pelvis was
performed following the standard protocol during bolus
administration of intravenous contrast.
CONTRAST:  75mL OMNIPAQUE IOHEXOL 300 MG/ML  SOLN

[Series 2: cap with · axial · 0.83mm/px · z∈[-597,-77]mm · 9 of 132 slices shown, 12 images]
[im 14/132  mediastinal]
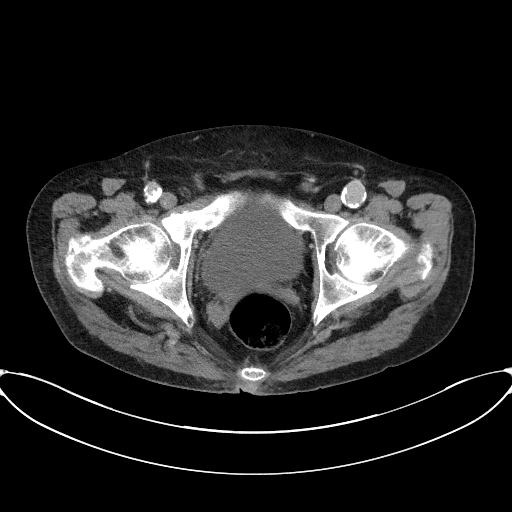
[im 14/132  lung]
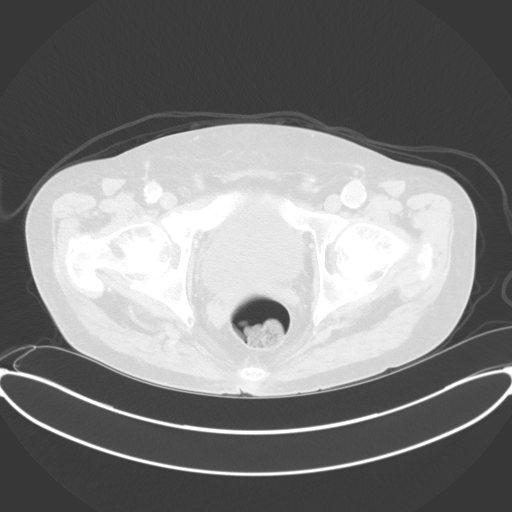
[im 27/132  lung]
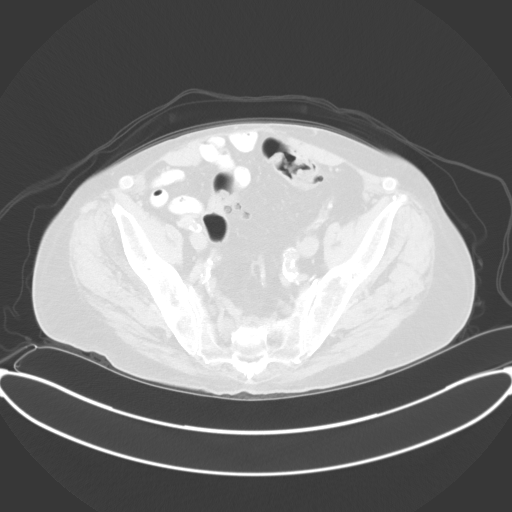
[im 40/132  lung]
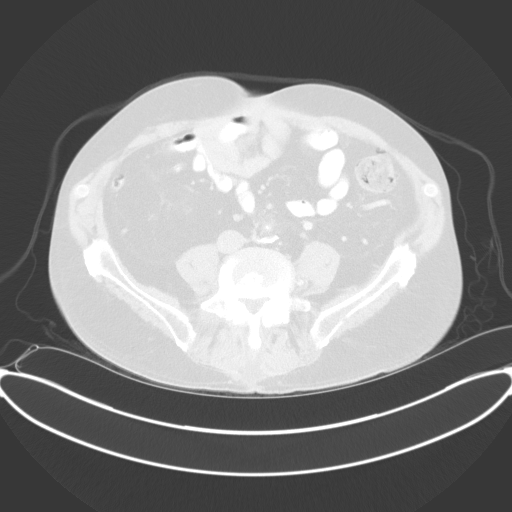
[im 53/132  lung]
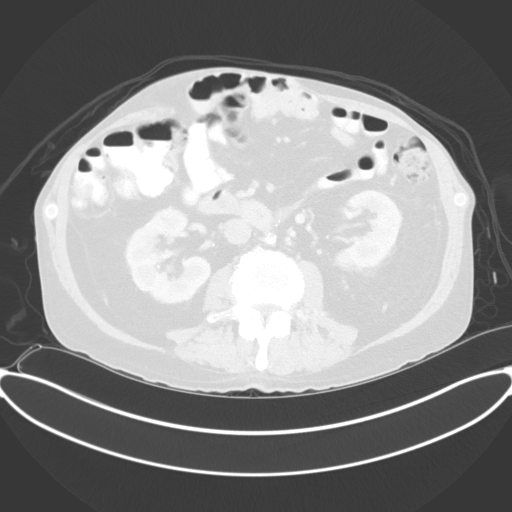
[im 66/132  mediastinal]
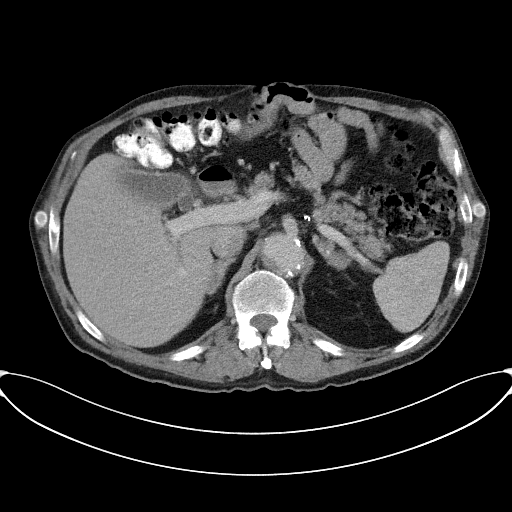
[im 66/132  lung]
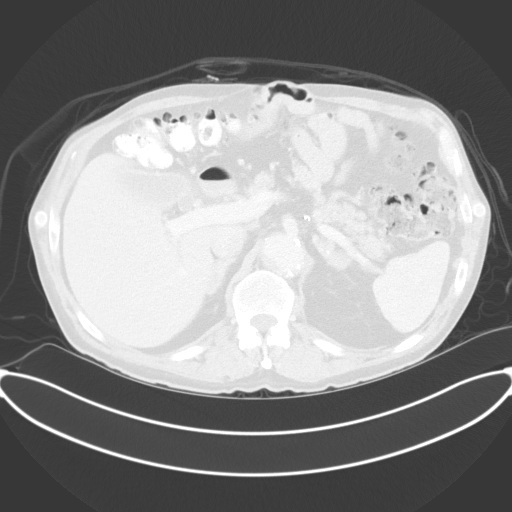
[im 79/132  lung]
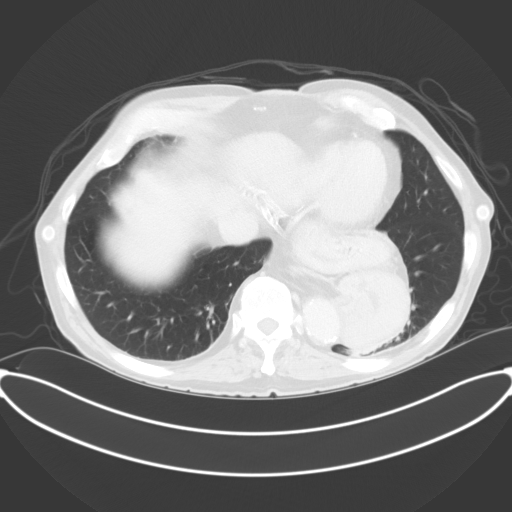
[im 92/132  lung]
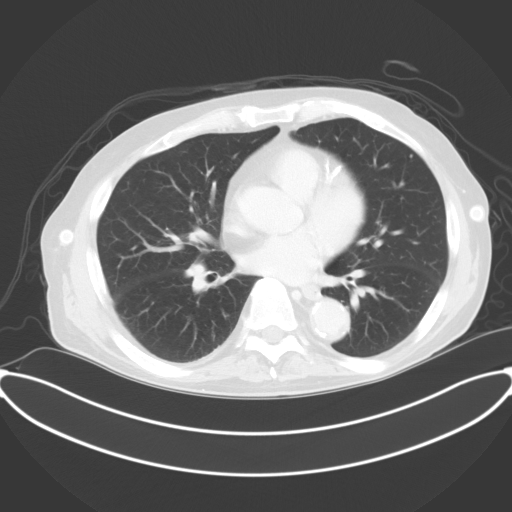
[im 105/132  lung]
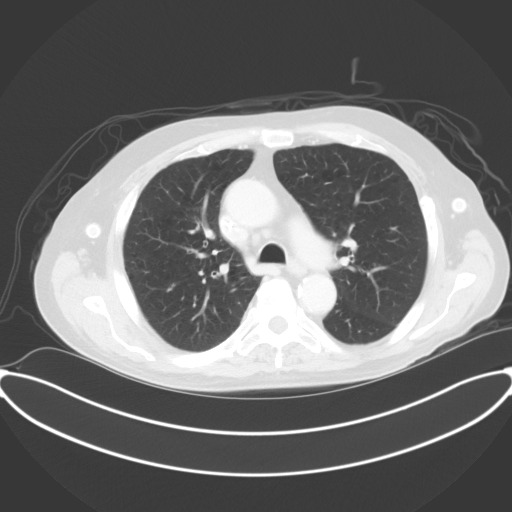
[im 118/132  mediastinal]
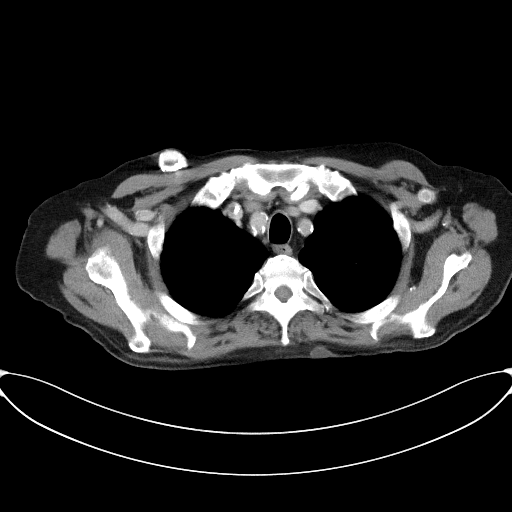
[im 118/132  lung]
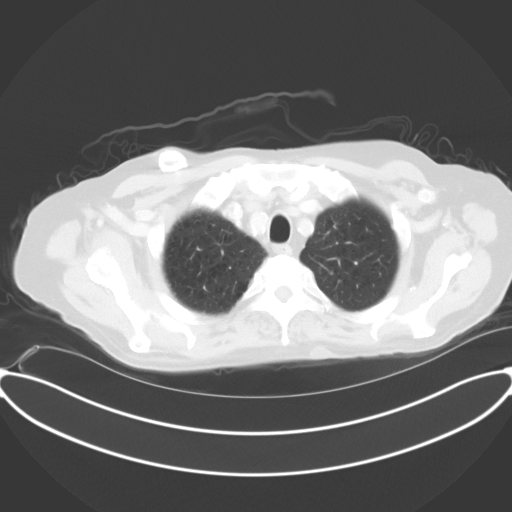

[Series 4: coronals · coronal · 0.99mm/px · 3 of 146 slices shown]
[im 30/146  lung]
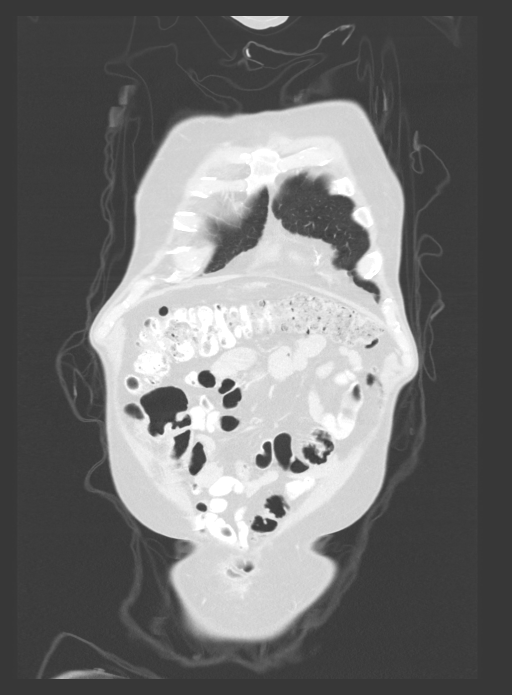
[im 59/146  lung]
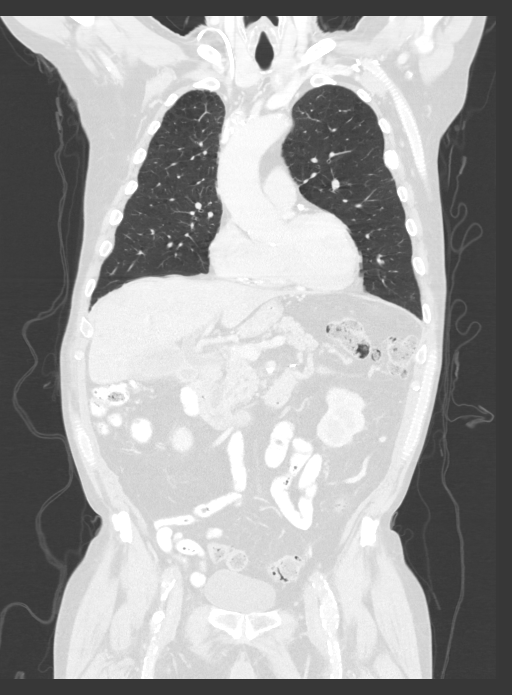
[im 88/146  lung]
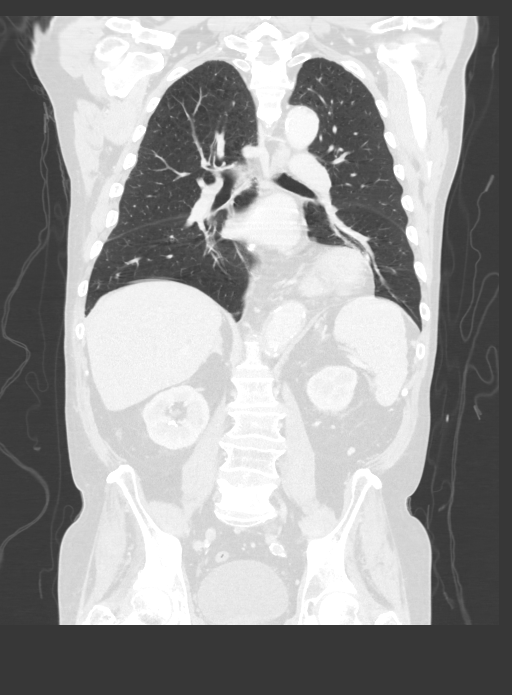

[Series 6: lung · axial · 0.83mm/px · z∈[-321,-273]mm · 2 of 170 slices shown]
[im 13/170  lung]
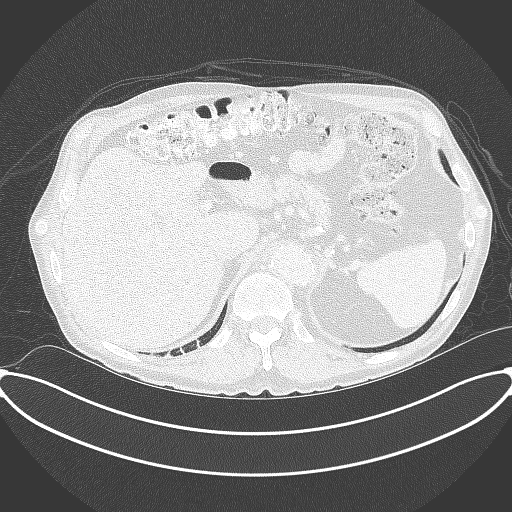
[im 37/170  lung]
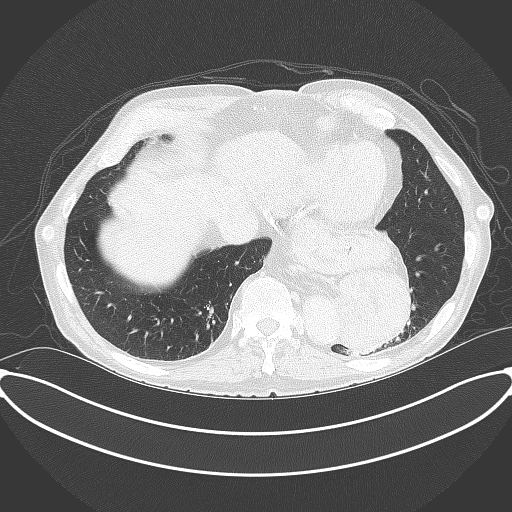

[14 of 36 positions shown; findings below may reference images not displayed]

FINDINGS: CT CHEST FINDINGS

Cardiovascular: Port in the anterior chest wall with tip in distal
SVC. Coronary artery calcification and aortic atherosclerotic
calcification. Bilateral ax fem vascular grafts noted.

Mediastinum/Nodes: Decrease in prominence of RIGHT hilar lymph
nodes. No pathologically enlarged lymph nodes remain. No new
adenopathy in the mediastinum.

Lungs/Pleura: Interval resolution of the RIGHT lower lobe pulmonary
nodule seen on comparison FDG PET scan. No new nodularity.

Large hiatal hernia extends into the LEFT lower hemithorax.

Musculoskeletal: Destructive rib lesion along the posterior RIGHT
seventh rib again noted with pathologic fracture. No advancement.
Similar rib lesion in the anterior LEFT rib (image 69/6). No new
skeletal lesions identified

CT ABDOMEN AND PELVIS FINDINGS

Hepatobiliary: Multiple small hepatic cysts. Mild gallbladder wall
thickening. Common bile duct is prominent at 10 mm unchanged from
prior.

Pancreas: Pancreas is normal. No ductal dilatation. No pancreatic
inflammation.

Spleen: Normal spleen

Adrenals/urinary tract: Interval decrease in size of the LEFT
adrenal gland which measures 9 mm in short axis compared to 18 mm on
prior. RIGHT adrenal gland is now normal size.

Low-density cyst lower pole of the LEFT kidney. Nonobstructing RIGHT
renal calculi. Ureters and bladder normal

Stomach/Bowel: Large hiatal hernia with majority of stomach above
the LEFT hemidiaphragm. Stomach otherwise normal. Small bowel and
colon unremarkable.

Vascular/Lymphatic: Chronic occlusion of the abdominal aorta below
the renal arteries. No retroperitoneal or upper abdominal
adenopathy.

Reproductive: Unremarkable

Other: No peritoneal metastasis

Musculoskeletal: Post sclerotic lesion is subtly evident in the
RIGHT acetabulum. Lesion much more obvious on comparison FDG PET
scan. No skeletal metastasis identified.
IMPRESSION: Chest Impression:

1. Marked improvement in RIGHT hilar and mediastinal
lymphadenopathy.
2. Resolution of RIGHT lower lobe pulmonary nodule.
3. No evidence of small cell lung cancer progression.

Abdomen / Pelvis Impression:

1. Interval reduction in size of adrenal gland metastasis.
2. No evidence disease progression in the abdomen pelvis.
3. Stable skeletal metastasis in the ribs and RIGHT pelvis.

## 2021-02-10 IMAGING — MR MR HEAD WO/W CM
13 series · 48 of 48 positions shown · IV contrast (gadavist)
Comparison: MRI head with contrast [DATE]

CLINICAL DATA: Small cell lung cancer. Follow-up treated metastatic
disease to brain.

EXAM:
MRI HEAD WITHOUT AND WITH CONTRAST
TECHNIQUE: Multiplanar, multiecho pulse sequences of the brain and surrounding
structures were obtained without and with intravenous contrast.
CONTRAST:  7.5mL GADAVIST GADOBUTROL 1 MMOL/ML IV SOLN

[Series 5: DWI · axial · 3.0mm · 1.36mm/px · z∈[-13,+126]mm · 5 of 96 slices shown (1 of 2)]
[im 1/96]
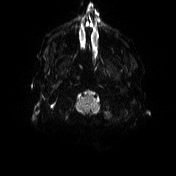
[im 24/96]
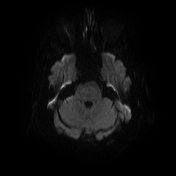
[im 48/96]
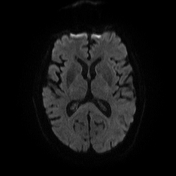
[im 72/96]
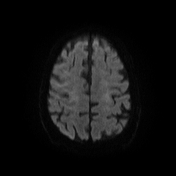
[im 96/96]
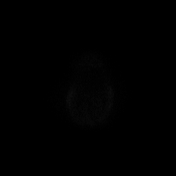

[Series 6: DWI · axial · 3.0mm · 1.36mm/px · z∈[-13,+126]mm · 2 of 48 slices shown (2 of 2)]
[im 1/48]
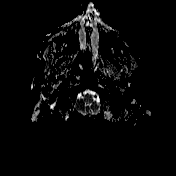
[im 48/48]
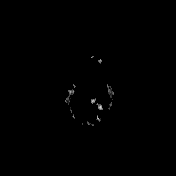

[Series 7: T1 · sagittal · 5.0mm · 0.75mm/px · 2 of 24 slices shown (1 of 2)]
[im 1/24]
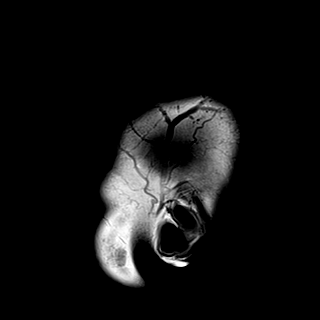
[im 24/24]
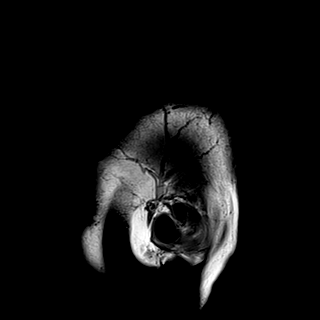

[Series 8: T2 · axial · 5.0mm · 0.62mm/px · z∈[-25,+136]mm · 2 of 26 slices shown]
[im 1/26]
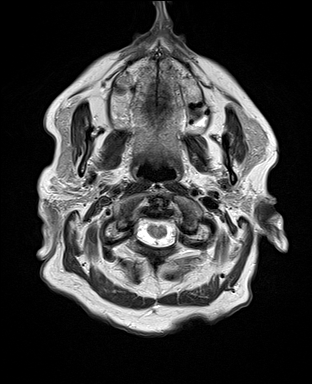
[im 26/26]
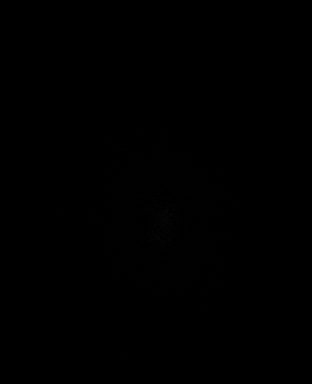

[Series 10: swi_images · axial · 3.0mm · 0.75mm/px · z∈[-26,+137]mm · 4 of 56 slices shown]
[im 1/56]
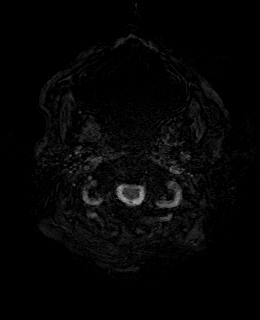
[im 19/56]
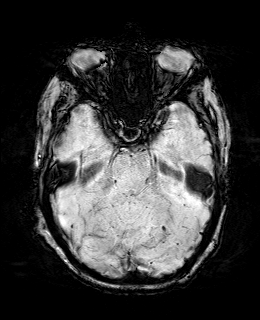
[im 37/56]
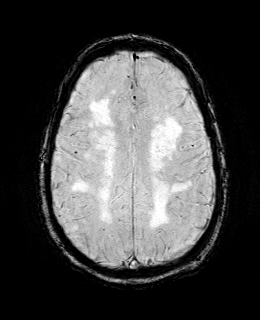
[im 56/56]
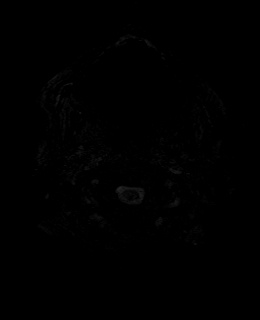

[Series 11: FLAIR · axial · 3.0mm · 0.75mm/px · z∈[-23,+134]mm · 3 of 54 slices shown]
[im 1/54]
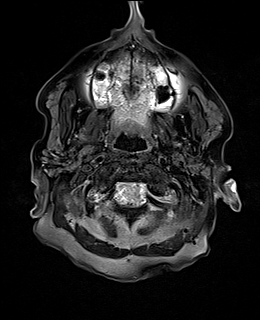
[im 27/54]
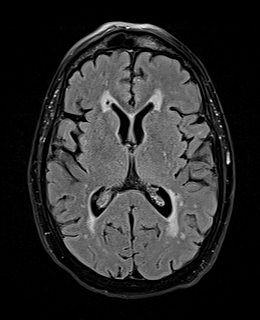
[im 54/54]
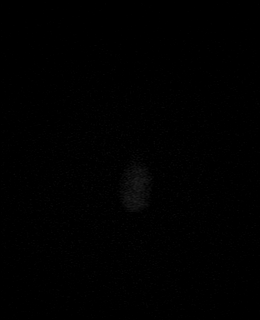

[Series 12: T1 · axial · 1.0mm · 0.94mm/px · z∈[-15,+126]mm · 9 of 144 slices shown (2 of 2)]
[im 1/144]
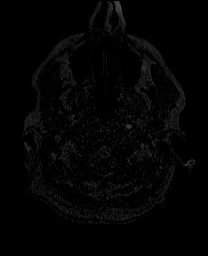
[im 18/144]
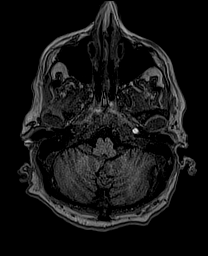
[im 36/144]
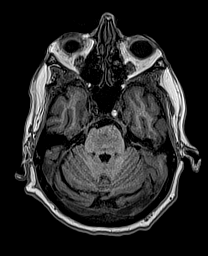
[im 54/144]
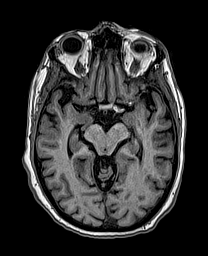
[im 72/144]
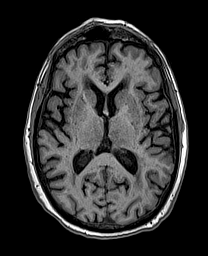
[im 90/144]
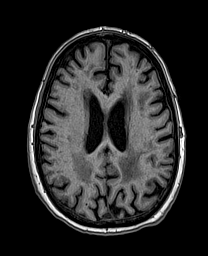
[im 108/144]
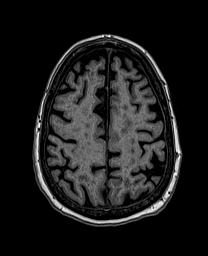
[im 126/144]
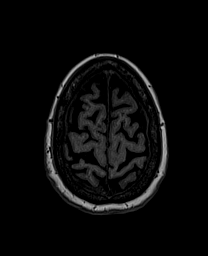
[im 144/144]
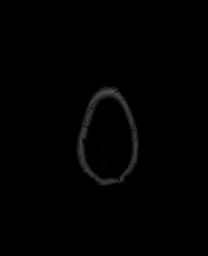

[Series 13: cor dwi_tracew · coronal · 5.0mm · 1.53mm/px · 4 of 56 slices shown]
[im 1/56]
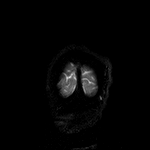
[im 19/56]
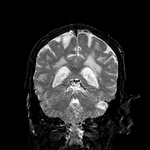
[im 37/56]
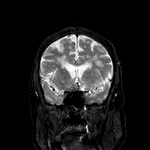
[im 56/56]
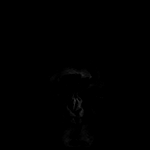

[Series 14: cor dwi_adc · coronal · 5.0mm · 1.53mm/px · 2 of 28 slices shown]
[im 1/28]
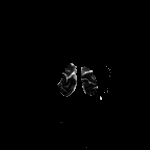
[im 28/28]
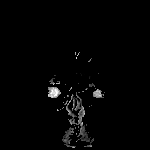

[Series 16: T1 post-contrast · axial · 1.0mm · 0.94mm/px · z∈[-15,+126]mm · 9 of 144 slices shown (1 of 3)]
[im 1/144]
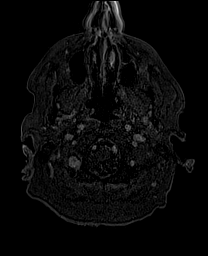
[im 18/144]
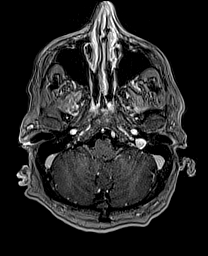
[im 36/144]
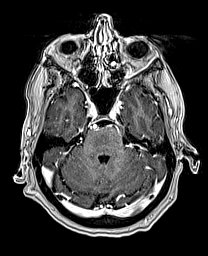
[im 54/144]
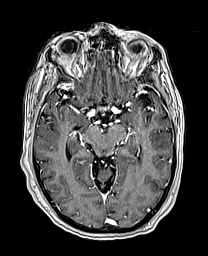
[im 72/144]
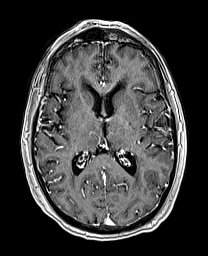
[im 90/144]
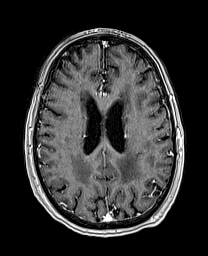
[im 108/144]
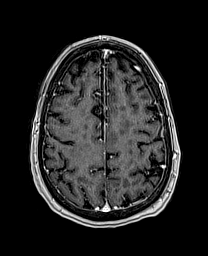
[im 126/144]
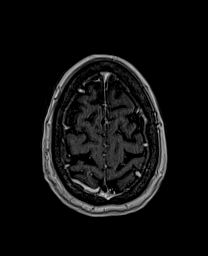
[im 144/144]
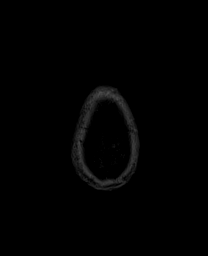

[Series 17: T2 post-contrast · coronal · 5.0mm · 0.57mm/px · 2 of 32 slices shown]
[im 1/32]
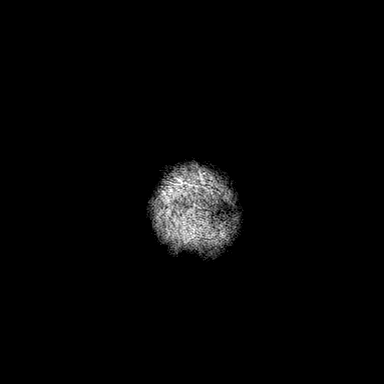
[im 32/32]
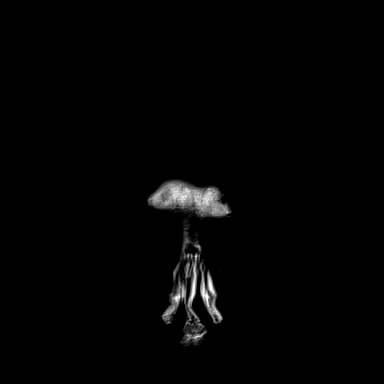

[Series 18: T1 post-contrast · coronal · 5.0mm · 0.43mm/px · 2 of 32 slices shown (2 of 3)]
[im 1/32]
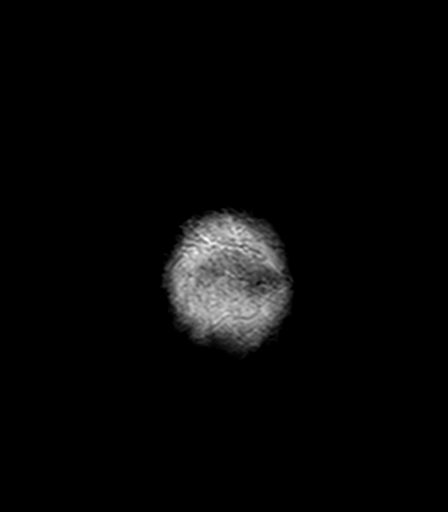
[im 32/32]
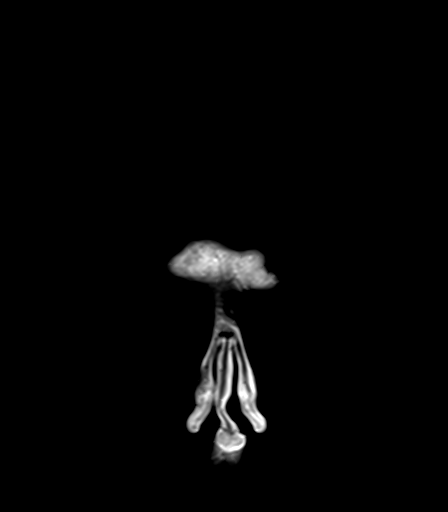

[Series 19: T1 post-contrast · sagittal · 5.0mm · 0.75mm/px · 2 of 24 slices shown (3 of 3)]
[im 1/24]
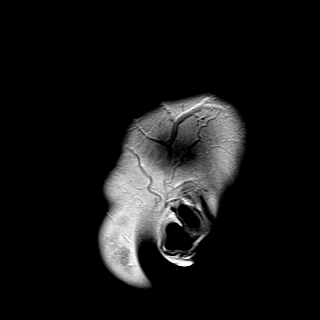
[im 24/24]
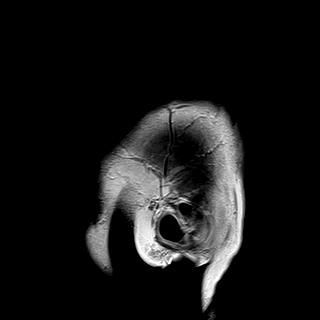

[48 of 48 positions shown; findings below may reference images not displayed]

FINDINGS: Brain: Interval improvement in multiple enhancing lesions compatible
with treated metastatic disease.

5 mm enhancing lesion right lower cerebellum significantly smaller.
Additional lesions in the right lateral cerebellum and left inferior
cerebellum no longer visualized.

Enhancing lesion left lateral temporal lobe is significantly smaller
although shows interval hemorrhage.

Left frontal enhancing lesion significantly smaller

Second lesion in the anterior left frontal lobe significantly
smaller and difficult to identify. Small amount of associated
hemorrhage

No new enhancing lesions.

Ventricle size normal. Moderate to extensive changes throughout the
white matter and pons similar to the prior study. No acute infarct.

Vascular: Normal arterial flow voids.

Skull and upper cervical spine: No focal skeletal lesion identified.

Sinuses/Orbits: Negative

Other: None
IMPRESSION: Significant improvement in 6 metastatic deposits in the brain which
are smaller or no longer visible. Mild interval hemorrhage in the
left lateral temporal lesion. No new lesions.

Moderate to extensive changes in the white matter and pons
compatible with chronic microvascular ischemia.

## 2021-02-10 IMAGING — CT CT CHEST W/ CM
3 of 5 series · 14 of 36 positions shown, 17 images · IV contrast (OMNIPAQUE)
Comparison: CT [DATE], PET-CT [DATE]

CLINICAL DATA: SCL Ca to bone, adrenal gland and lung; chemo and
immunotherpy ongoing; XRT ongoing; wt loss; decreased appetite

EXAM:
CT CHEST, ABDOMEN, AND PELVIS WITH CONTRAST
TECHNIQUE: Multidetector CT imaging of the chest, abdomen and pelvis was
performed following the standard protocol during bolus
administration of intravenous contrast.
CONTRAST:  75mL OMNIPAQUE IOHEXOL 300 MG/ML  SOLN

[Series 2: cap with · axial · 0.83mm/px · z∈[-597,-77]mm · 9 of 132 slices shown, 12 images]
[im 14/132  mediastinal]
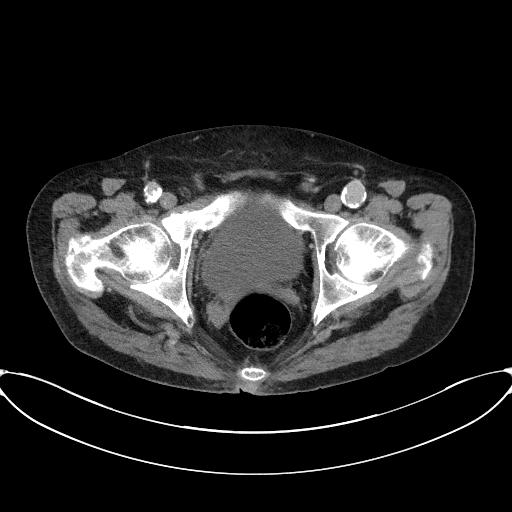
[im 14/132  lung]
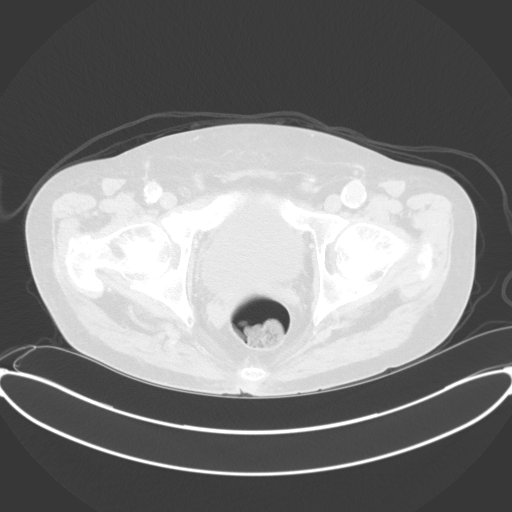
[im 27/132  lung]
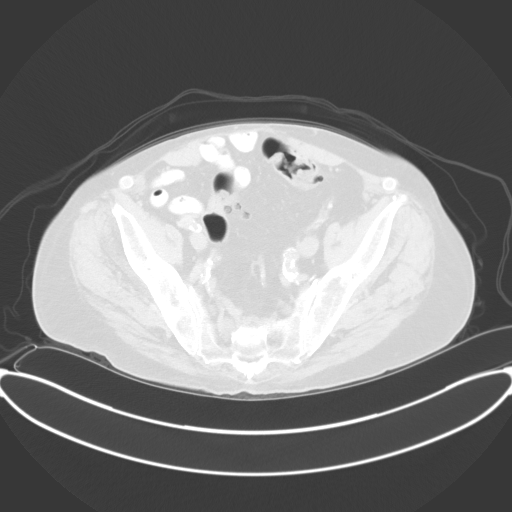
[im 40/132  lung]
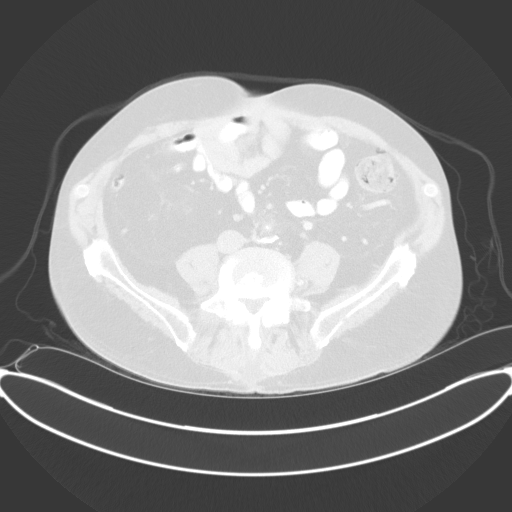
[im 53/132  lung]
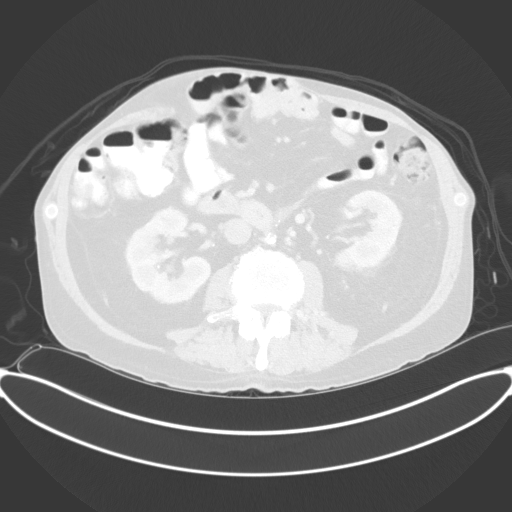
[im 66/132  mediastinal]
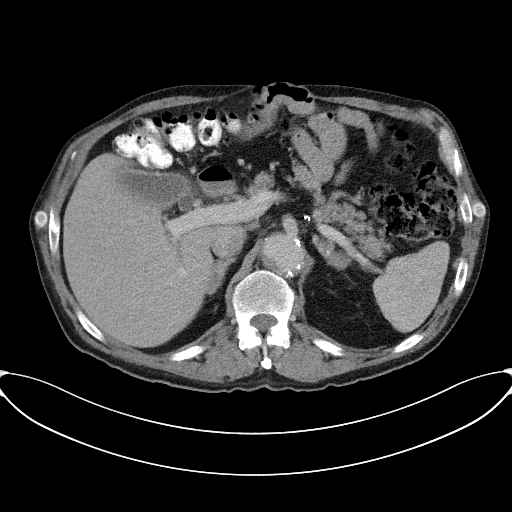
[im 66/132  lung]
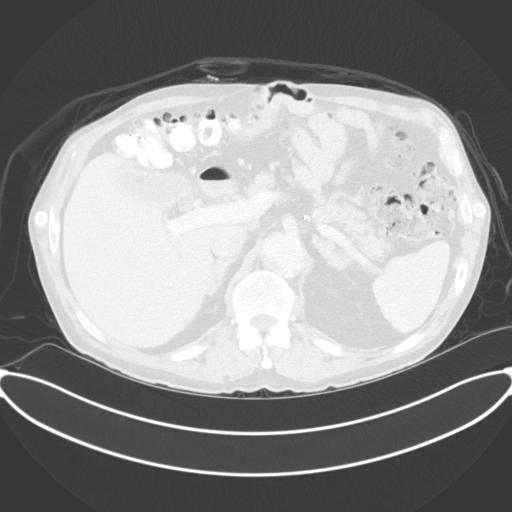
[im 79/132  lung]
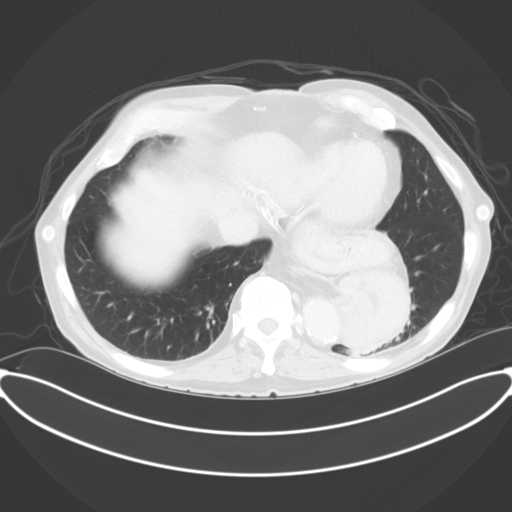
[im 92/132  lung]
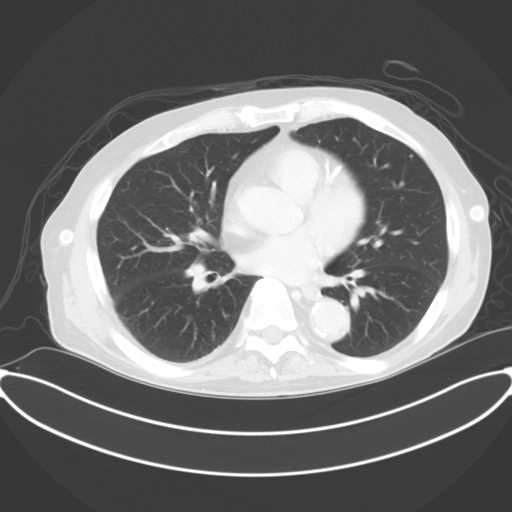
[im 105/132  lung]
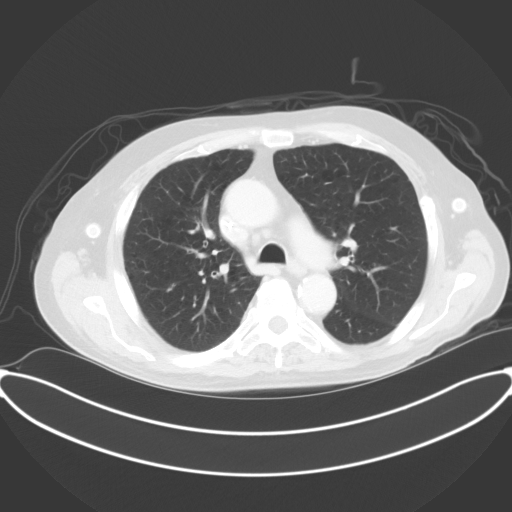
[im 118/132  mediastinal]
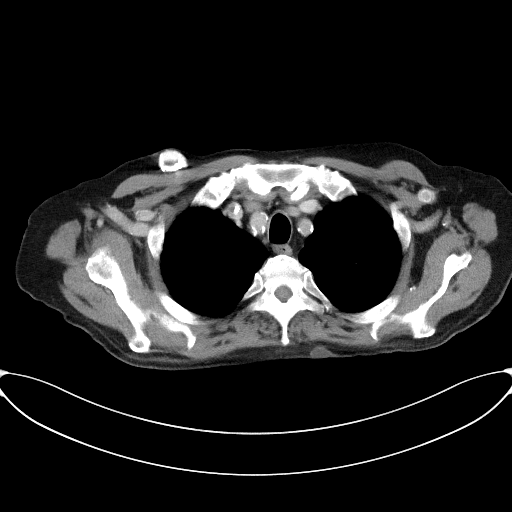
[im 118/132  lung]
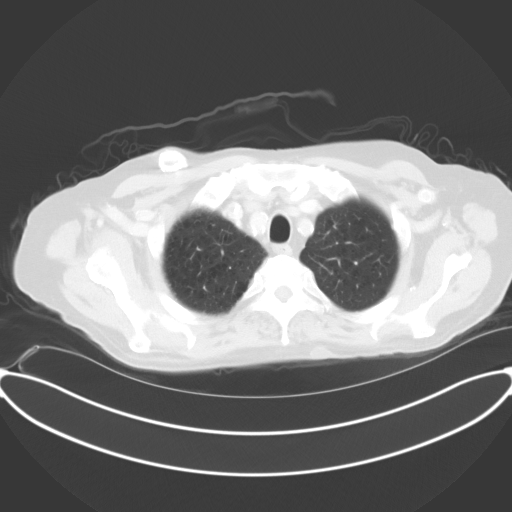

[Series 4: coronals · coronal · 0.99mm/px · 3 of 146 slices shown]
[im 30/146  lung]
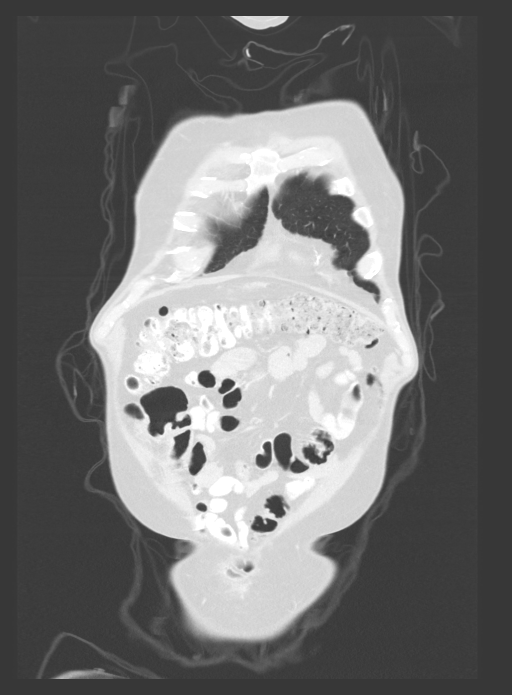
[im 59/146  lung]
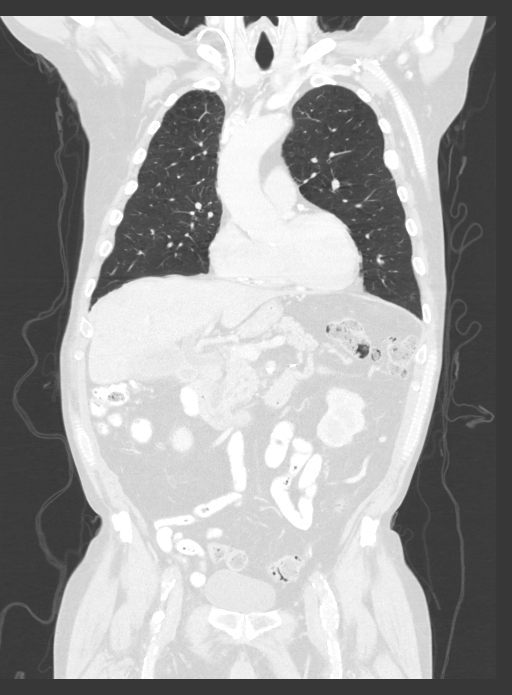
[im 88/146  lung]
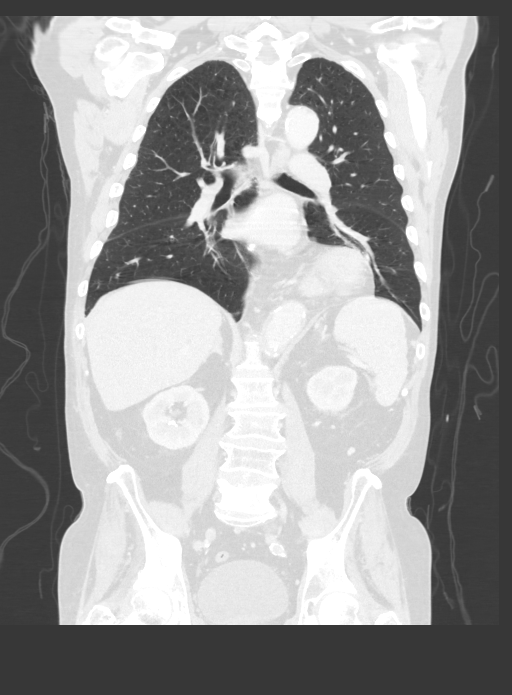

[Series 6: lung · axial · 0.83mm/px · z∈[-321,-273]mm · 2 of 170 slices shown]
[im 13/170  lung]
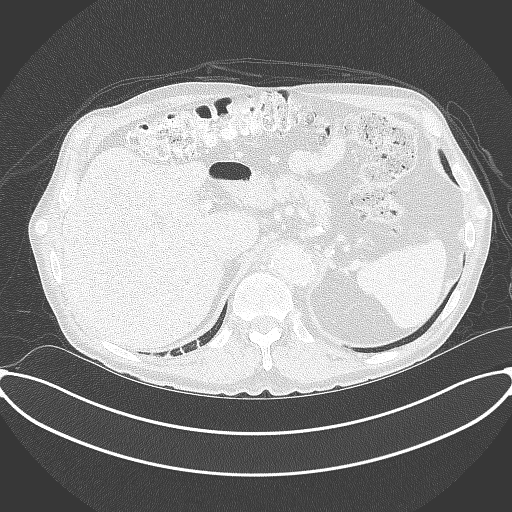
[im 37/170  lung]
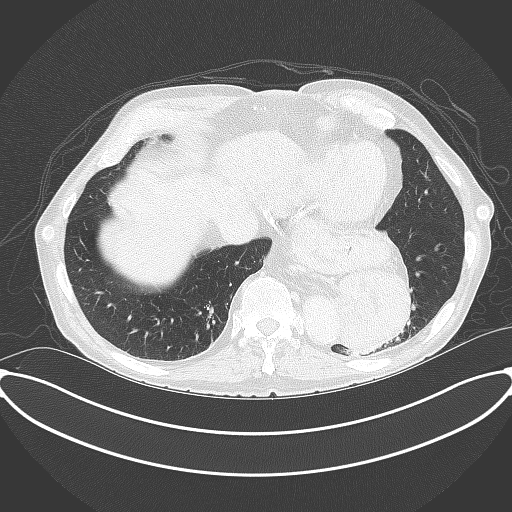

[14 of 36 positions shown; findings below may reference images not displayed]

FINDINGS: CT CHEST FINDINGS

Cardiovascular: Port in the anterior chest wall with tip in distal
SVC. Coronary artery calcification and aortic atherosclerotic
calcification. Bilateral ax fem vascular grafts noted.

Mediastinum/Nodes: Decrease in prominence of RIGHT hilar lymph
nodes. No pathologically enlarged lymph nodes remain. No new
adenopathy in the mediastinum.

Lungs/Pleura: Interval resolution of the RIGHT lower lobe pulmonary
nodule seen on comparison FDG PET scan. No new nodularity.

Large hiatal hernia extends into the LEFT lower hemithorax.

Musculoskeletal: Destructive rib lesion along the posterior RIGHT
seventh rib again noted with pathologic fracture. No advancement.
Similar rib lesion in the anterior LEFT rib (image 69/6). No new
skeletal lesions identified

CT ABDOMEN AND PELVIS FINDINGS

Hepatobiliary: Multiple small hepatic cysts. Mild gallbladder wall
thickening. Common bile duct is prominent at 10 mm unchanged from
prior.

Pancreas: Pancreas is normal. No ductal dilatation. No pancreatic
inflammation.

Spleen: Normal spleen

Adrenals/urinary tract: Interval decrease in size of the LEFT
adrenal gland which measures 9 mm in short axis compared to 18 mm on
prior. RIGHT adrenal gland is now normal size.

Low-density cyst lower pole of the LEFT kidney. Nonobstructing RIGHT
renal calculi. Ureters and bladder normal

Stomach/Bowel: Large hiatal hernia with majority of stomach above
the LEFT hemidiaphragm. Stomach otherwise normal. Small bowel and
colon unremarkable.

Vascular/Lymphatic: Chronic occlusion of the abdominal aorta below
the renal arteries. No retroperitoneal or upper abdominal
adenopathy.

Reproductive: Unremarkable

Other: No peritoneal metastasis

Musculoskeletal: Post sclerotic lesion is subtly evident in the
RIGHT acetabulum. Lesion much more obvious on comparison FDG PET
scan. No skeletal metastasis identified.
IMPRESSION: Chest Impression:

1. Marked improvement in RIGHT hilar and mediastinal
lymphadenopathy.
2. Resolution of RIGHT lower lobe pulmonary nodule.
3. No evidence of small cell lung cancer progression.

Abdomen / Pelvis Impression:

1. Interval reduction in size of adrenal gland metastasis.
2. No evidence disease progression in the abdomen pelvis.
3. Stable skeletal metastasis in the ribs and RIGHT pelvis.

## 2021-02-10 MED ORDER — SODIUM CHLORIDE (PF) 0.9 % IJ SOLN
INTRAMUSCULAR | Status: AC
  Filled 2021-02-10: qty 50

## 2021-02-10 MED ORDER — GADOBUTROL 1 MMOL/ML IV SOLN
7.5000 mL | Freq: Once | INTRAVENOUS | Status: AC | PRN
  Administered 2021-02-10: 7.5 mL via INTRAVENOUS

## 2021-02-10 MED ORDER — IOHEXOL 300 MG/ML  SOLN
75.0000 mL | Freq: Once | INTRAMUSCULAR | Status: AC | PRN
  Administered 2021-02-10: 75 mL via INTRAVENOUS

## 2021-02-11 ENCOUNTER — Inpatient Hospital Stay: Payer: PPO

## 2021-02-11 ENCOUNTER — Other Ambulatory Visit: Payer: Self-pay

## 2021-02-11 ENCOUNTER — Telehealth: Payer: Self-pay | Admitting: Radiation Oncology

## 2021-02-11 ENCOUNTER — Other Ambulatory Visit: Payer: PPO

## 2021-02-11 ENCOUNTER — Inpatient Hospital Stay (HOSPITAL_BASED_OUTPATIENT_CLINIC_OR_DEPARTMENT_OTHER): Payer: PPO | Admitting: Physician Assistant

## 2021-02-11 VITALS — BP 99/56 | HR 64 | Resp 18

## 2021-02-11 VITALS — BP 91/53 | HR 62 | Temp 97.8°F | Resp 17 | Ht 69.0 in | Wt 161.2 lb

## 2021-02-11 DIAGNOSIS — C7931 Secondary malignant neoplasm of brain: Secondary | ICD-10-CM | POA: Diagnosis not present

## 2021-02-11 DIAGNOSIS — C7971 Secondary malignant neoplasm of right adrenal gland: Secondary | ICD-10-CM

## 2021-02-11 DIAGNOSIS — C3491 Malignant neoplasm of unspecified part of right bronchus or lung: Secondary | ICD-10-CM

## 2021-02-11 DIAGNOSIS — C7951 Secondary malignant neoplasm of bone: Secondary | ICD-10-CM

## 2021-02-11 DIAGNOSIS — I959 Hypotension, unspecified: Secondary | ICD-10-CM

## 2021-02-11 DIAGNOSIS — Z5112 Encounter for antineoplastic immunotherapy: Secondary | ICD-10-CM

## 2021-02-11 DIAGNOSIS — Z5111 Encounter for antineoplastic chemotherapy: Secondary | ICD-10-CM

## 2021-02-11 DIAGNOSIS — C7972 Secondary malignant neoplasm of left adrenal gland: Secondary | ICD-10-CM | POA: Diagnosis not present

## 2021-02-11 DIAGNOSIS — Z95828 Presence of other vascular implants and grafts: Secondary | ICD-10-CM

## 2021-02-11 LAB — CMP (CANCER CENTER ONLY)
ALT: <6 U/L (ref 0–44)
AST: 10 U/L — ABNORMAL LOW (ref 15–41)
Albumin: 3 g/dL — ABNORMAL LOW (ref 3.5–5.0)
Alkaline Phosphatase: 118 U/L (ref 38–126)
Anion gap: 9 (ref 5–15)
BUN: 17 mg/dL (ref 8–23)
CO2: 23 mmol/L (ref 22–32)
Calcium: 8.9 mg/dL (ref 8.9–10.3)
Chloride: 106 mmol/L (ref 98–111)
Creatinine: 1.01 mg/dL (ref 0.61–1.24)
GFR, Estimated: >60 mL/min (ref >60)
Glucose, Bld: 122 mg/dL — ABNORMAL HIGH (ref 70–99)
Potassium: 4.3 mmol/L (ref 3.5–5.1)
Sodium: 138 mmol/L (ref 135–145)
Total Bilirubin: 0.3 mg/dL (ref 0.3–1.2)
Total Protein: 6.1 g/dL — ABNORMAL LOW (ref 6.5–8.1)

## 2021-02-11 LAB — CBC WITH DIFFERENTIAL (CANCER CENTER ONLY)
Abs Immature Granulocytes: 0.2 10*3/uL — ABNORMAL HIGH (ref 0.00–0.07)
Basophils Absolute: 0 10*3/uL (ref 0.0–0.1)
Basophils Relative: 0 %
Eosinophils Absolute: 0 10*3/uL (ref 0.0–0.5)
Eosinophils Relative: 0 %
HCT: 38.2 % — ABNORMAL LOW (ref 39.0–52.0)
Hemoglobin: 12.2 g/dL — ABNORMAL LOW (ref 13.0–17.0)
Immature Granulocytes: 2 %
Lymphocytes Relative: 1 %
Lymphs Abs: 0.2 10*3/uL — ABNORMAL LOW (ref 0.7–4.0)
MCH: 30.7 pg (ref 26.0–34.0)
MCHC: 31.9 g/dL (ref 30.0–36.0)
MCV: 96.2 fL (ref 80.0–100.0)
Monocytes Absolute: 1.5 10*3/uL — ABNORMAL HIGH (ref 0.1–1.0)
Monocytes Relative: 11 %
Neutro Abs: 11.2 10*3/uL — ABNORMAL HIGH (ref 1.7–7.7)
Neutrophils Relative %: 86 %
Platelet Count: 195 10*3/uL (ref 150–400)
RBC: 3.97 MIL/uL — ABNORMAL LOW (ref 4.22–5.81)
RDW: 15.5 % (ref 11.5–15.5)
WBC Count: 13.1 10*3/uL — ABNORMAL HIGH (ref 4.0–10.5)
nRBC: 0 % (ref 0.0–0.2)

## 2021-02-11 MED ORDER — SODIUM CHLORIDE 0.9 % IV SOLN
1500.0000 mg | Freq: Once | INTRAVENOUS | Status: AC
  Administered 2021-02-11: 1500 mg via INTRAVENOUS
  Filled 2021-02-11: qty 30

## 2021-02-11 MED ORDER — SODIUM CHLORIDE 0.9 % IV SOLN
460.0000 mg | Freq: Once | INTRAVENOUS | Status: AC
  Administered 2021-02-11: 460 mg via INTRAVENOUS
  Filled 2021-02-11: qty 46

## 2021-02-11 MED ORDER — SODIUM CHLORIDE 0.9% FLUSH
10.0000 mL | Freq: Once | INTRAVENOUS | Status: AC
Start: 2021-02-11 — End: 2021-02-11
  Administered 2021-02-11: 10 mL
  Filled 2021-02-11: qty 10

## 2021-02-11 MED ORDER — SODIUM CHLORIDE 0.9 % IV SOLN
150.0000 mg | Freq: Once | INTRAVENOUS | Status: AC
  Administered 2021-02-11: 150 mg via INTRAVENOUS
  Filled 2021-02-11: qty 150

## 2021-02-11 MED ORDER — PALONOSETRON HCL INJECTION 0.25 MG/5ML
INTRAVENOUS | Status: AC
  Filled 2021-02-11: qty 5

## 2021-02-11 MED ORDER — SODIUM CHLORIDE 0.9 % IV SOLN
Freq: Once | INTRAVENOUS | Status: AC
  Filled 2021-02-11: qty 250

## 2021-02-11 MED ORDER — SODIUM CHLORIDE 0.9 % IV SOLN
100.0000 mg/m2 | Freq: Once | INTRAVENOUS | Status: AC
  Administered 2021-02-11: 200 mg via INTRAVENOUS
  Filled 2021-02-11: qty 10

## 2021-02-11 MED ORDER — PALONOSETRON HCL INJECTION 0.25 MG/5ML
0.2500 mg | Freq: Once | INTRAVENOUS | Status: AC
  Administered 2021-02-11: 0.25 mg via INTRAVENOUS

## 2021-02-11 MED ORDER — TRILACICLIB DIHYDROCHLORIDE INJECTION 300 MG
240.0000 mg/m2 | Freq: Once | INTRAVENOUS | Status: AC
  Administered 2021-02-11: 465 mg via INTRAVENOUS
  Filled 2021-02-11: qty 31

## 2021-02-11 MED ORDER — SODIUM CHLORIDE 0.9 % IV SOLN
10.0000 mg | Freq: Once | INTRAVENOUS | Status: AC
  Administered 2021-02-11: 10 mg via INTRAVENOUS
  Filled 2021-02-11: qty 10

## 2021-02-11 MED ORDER — SODIUM CHLORIDE 0.9% FLUSH
10.0000 mL | INTRAVENOUS | Status: DC | PRN
  Administered 2021-02-11: 10 mL
  Filled 2021-02-11: qty 10

## 2021-02-11 MED ORDER — SODIUM CHLORIDE 0.9 % IV SOLN
Freq: Once | INTRAVENOUS | Status: AC
Start: 2021-02-11 — End: 2021-02-11
  Filled 2021-02-11: qty 250

## 2021-02-11 MED ORDER — HEPARIN SOD (PORK) LOCK FLUSH 100 UNIT/ML IV SOLN
500.0000 [IU] | Freq: Once | INTRAVENOUS | Status: AC | PRN
  Administered 2021-02-11: 500 [IU]
  Filled 2021-02-11: qty 5

## 2021-02-11 NOTE — Patient Instructions (Signed)
[facility]   Discharge Instructions: Thank you for choosing [facility] to provide your oncology and hematology care.   If you have a lab appointment with the [facility], please go directly to the [facility] and check in at the registration area.   Wear comfortable clothing and clothing appropriate for easy access to any Portacath or PICC line.   We strive to give you quality time with your provider. You may need to reschedule your appointment if you arrive late (15 or more minutes).  Arriving late affects you and other patients whose appointments are after yours.  Also, if you miss three or more appointments without notifying the office, you may be dismissed from the clinic at the provider's discretion.      For prescription refill requests, have your pharmacy contact our office and allow 72 hours for refills to be completed.    Today you received the following chemotherapy and/or immunotherapy agents: Durvalumab (Imfinzi), Etoposide, and Carboplatin     To help prevent nausea and vomiting after your treatment, we encourage you to take your nausea medication as directed.  BELOW ARE SYMPTOMS THAT SHOULD BE REPORTED IMMEDIATELY: . *FEVER GREATER THAN 100.4 F (38 C) OR HIGHER . *CHILLS OR SWEATING . *NAUSEA AND VOMITING THAT IS NOT CONTROLLED WITH YOUR NAUSEA MEDICATION . *UNUSUAL SHORTNESS OF BREATH . *UNUSUAL BRUISING OR BLEEDING . *URINARY PROBLEMS (pain or burning when urinating, or frequent urination) . *BOWEL PROBLEMS (unusual diarrhea, constipation, pain near the anus) . TENDERNESS IN MOUTH AND THROAT WITH OR WITHOUT PRESENCE OF ULCERS (sore throat, sores in mouth, or a toothache) . UNUSUAL RASH, SWELLING OR PAIN  . UNUSUAL VAGINAL DISCHARGE OR ITCHING   Items with * indicate a potential emergency and should be followed up as soon as possible or go to the Emergency Department if any problems should occur.  Please show the  CHEMOTHERAPY ALERT CARD or IMMUNOTHERAPY ALERT CARD at check-in to the Emergency Department and triage nurse.  Should you have questions after your visit or need to cancel or reschedule your appointment, please contact [facility]  Dept: [phone]  and follow the prompts.  Office hours are 8:00 a.m. to 4:30 p.m. Monday - Friday. Please note that voicemails left after 4:00 p.m. may not be returned until the following business day.  We are closed weekends and major holidays. You have access to a nurse at all times for urgent questions. Please call the main number to the clinic Dept: [phone] and follow the prompts.   For any non-urgent questions, you may also contact your provider using MyChart. We now offer e-Visits for anyone 55 and older to request care online for non-urgent symptoms. For details visit [email or web address].   Also download the MyChart app! Go to the app store, search "MyChart", open the app, select [facility], and log in with your MyChart username and password.  Due to Covid, a mask is required upon entering the hospital/clinic. If you do not have a mask, one will be given to you upon arrival. For doctor visits, patients may have 1 support person aged 46 or older with them. For treatment visits, patients cannot have anyone with them due to current Covid guidelines and our immunocompromised population.

## 2021-02-11 NOTE — Telephone Encounter (Signed)
I spoke with the patient to let him know that we had received communication from medical oncology about the results of his scans and that we would plan to follow up with him at the conclusion of his 4th cycle chemotherapy which should be around 03/06/21. He is in agreement with this plan.

## 2021-02-12 ENCOUNTER — Inpatient Hospital Stay: Payer: PPO

## 2021-02-12 VITALS — BP 108/52 | HR 73 | Temp 98.6°F | Resp 18

## 2021-02-12 DIAGNOSIS — Z5111 Encounter for antineoplastic chemotherapy: Secondary | ICD-10-CM | POA: Diagnosis not present

## 2021-02-12 DIAGNOSIS — C3491 Malignant neoplasm of unspecified part of right bronchus or lung: Secondary | ICD-10-CM

## 2021-02-12 MED ORDER — SODIUM CHLORIDE 0.9 % IV SOLN
10.0000 mg | Freq: Once | INTRAVENOUS | Status: AC
  Administered 2021-02-12: 10 mg via INTRAVENOUS
  Filled 2021-02-12: qty 10

## 2021-02-12 MED ORDER — HEPARIN SOD (PORK) LOCK FLUSH 100 UNIT/ML IV SOLN
500.0000 [IU] | Freq: Once | INTRAVENOUS | Status: AC | PRN
Start: 2021-02-12 — End: 2021-02-12
  Administered 2021-02-12: 500 [IU]
  Filled 2021-02-12: qty 5

## 2021-02-12 MED ORDER — SODIUM CHLORIDE 0.9 % IV SOLN
Freq: Once | INTRAVENOUS | Status: AC
  Filled 2021-02-12: qty 250

## 2021-02-12 MED ORDER — DEXTROSE 5 % IV SOLN
240.0000 mg/m2 | Freq: Once | INTRAVENOUS | Status: AC
  Administered 2021-02-12: 465 mg via INTRAVENOUS
  Filled 2021-02-12: qty 31

## 2021-02-12 MED ORDER — SODIUM CHLORIDE 0.9 % IV SOLN
100.0000 mg/m2 | Freq: Once | INTRAVENOUS | Status: AC
  Administered 2021-02-12: 200 mg via INTRAVENOUS
  Filled 2021-02-12: qty 10

## 2021-02-12 MED ORDER — SODIUM CHLORIDE 0.9% FLUSH
10.0000 mL | INTRAVENOUS | Status: DC | PRN
  Administered 2021-02-12: 10 mL
  Filled 2021-02-12: qty 10

## 2021-02-12 NOTE — Patient Instructions (Signed)
[facility]  Discharge Instructions: Thank you for choosing [facility] to provide your oncology and hematology care.   If you have a lab appointment with the [facility], please go directly to the [facility] and check in at the registration area.   Wear comfortable clothing and clothing appropriate for easy access to any Portacath or PICC line.   We strive to give you quality time with your provider. You may need to reschedule your appointment if you arrive late (15 or more minutes).  Arriving late affects you and other patients whose appointments are after yours.  Also, if you miss three or more appointments without notifying the office, you may be dismissed from the clinic at the provider's discretion.      For prescription refill requests, have your pharmacy contact our office and allow 72 hours for refills to be completed.    Today you received the following chemotherapy and/or immunotherapy agents etoposide     To help prevent nausea and vomiting after your treatment, we encourage you to take your nausea medication as directed.  BELOW ARE SYMPTOMS THAT SHOULD BE REPORTED IMMEDIATELY: . *FEVER GREATER THAN 100.4 F (38 C) OR HIGHER . *CHILLS OR SWEATING . *NAUSEA AND VOMITING THAT IS NOT CONTROLLED WITH YOUR NAUSEA MEDICATION . *UNUSUAL SHORTNESS OF BREATH . *UNUSUAL BRUISING OR BLEEDING . *URINARY PROBLEMS (pain or burning when urinating, or frequent urination) . *BOWEL PROBLEMS (unusual diarrhea, constipation, pain near the anus) . TENDERNESS IN MOUTH AND THROAT WITH OR WITHOUT PRESENCE OF ULCERS (sore throat, sores in mouth, or a toothache) . UNUSUAL RASH, SWELLING OR PAIN  . UNUSUAL VAGINAL DISCHARGE OR ITCHING   Items with * indicate a potential emergency and should be followed up as soon as possible or go to the Emergency Department if any problems should occur.  Please show the CHEMOTHERAPY ALERT CARD or IMMUNOTHERAPY ALERT  CARD at check-in to the Emergency Department and triage nurse.  Should you have questions after your visit or need to cancel or reschedule your appointment, please contact [facility]  Dept: [phone]  and follow the prompts.  Office hours are 8:00 a.m. to 4:30 p.m. Monday - Friday. Please note that voicemails left after 4:00 p.m. may not be returned until the following business day.  We are closed weekends and major holidays. You have access to a nurse at all times for urgent questions. Please call the main number to the clinic Dept: [phone] and follow the prompts.   For any non-urgent questions, you may also contact your provider using MyChart. We now offer e-Visits for anyone 52 and older to request care online for non-urgent symptoms. For details visit [email or web address].   Also download the MyChart app! Go to the app store, search "MyChart", open the app, select [facility], and log in with your MyChart username and password.  Due to Covid, a mask is required upon entering the hospital/clinic. If you do not have a mask, one will be given to you upon arrival. For doctor visits, patients may have 1 support person aged 27 or older with them. For treatment visits, patients cannot have anyone with them due to current Covid guidelines and our immunocompromised population.

## 2021-02-13 ENCOUNTER — Inpatient Hospital Stay: Payer: PPO

## 2021-02-13 ENCOUNTER — Other Ambulatory Visit: Payer: Self-pay

## 2021-02-13 VITALS — BP 140/73 | HR 72 | Temp 98.2°F | Resp 18 | Wt 165.8 lb

## 2021-02-13 DIAGNOSIS — C3491 Malignant neoplasm of unspecified part of right bronchus or lung: Secondary | ICD-10-CM

## 2021-02-13 DIAGNOSIS — Z5111 Encounter for antineoplastic chemotherapy: Secondary | ICD-10-CM | POA: Diagnosis not present

## 2021-02-13 MED ORDER — HEPARIN SOD (PORK) LOCK FLUSH 100 UNIT/ML IV SOLN
500.0000 [IU] | Freq: Once | INTRAVENOUS | Status: AC | PRN
  Administered 2021-02-13: 500 [IU]
  Filled 2021-02-13: qty 5

## 2021-02-13 MED ORDER — SODIUM CHLORIDE 0.9 % IV SOLN
Freq: Once | INTRAVENOUS | Status: AC
  Filled 2021-02-13: qty 250

## 2021-02-13 MED ORDER — TRILACICLIB DIHYDROCHLORIDE INJECTION 300 MG
240.0000 mg/m2 | Freq: Once | INTRAVENOUS | Status: AC
  Administered 2021-02-13: 465 mg via INTRAVENOUS
  Filled 2021-02-13: qty 31

## 2021-02-13 MED ORDER — SODIUM CHLORIDE 0.9 % IV SOLN
10.0000 mg | Freq: Once | INTRAVENOUS | Status: DC
  Administered 2021-02-13: 10 mg via INTRAVENOUS
  Filled 2021-02-13: qty 1

## 2021-02-13 MED ORDER — SODIUM CHLORIDE 0.9% FLUSH
10.0000 mL | INTRAVENOUS | Status: DC | PRN
  Administered 2021-02-13: 10 mL
  Filled 2021-02-13: qty 10

## 2021-02-13 MED ORDER — SODIUM CHLORIDE 0.9 % IV SOLN
10.0000 mg | Freq: Once | INTRAVENOUS | Status: AC
  Administered 2021-02-13: 10 mg via INTRAVENOUS
  Filled 2021-02-13: qty 10

## 2021-02-13 MED ORDER — SODIUM CHLORIDE 0.9 % IV SOLN
100.0000 mg/m2 | Freq: Once | INTRAVENOUS | Status: AC
  Administered 2021-02-13: 200 mg via INTRAVENOUS
  Filled 2021-02-13: qty 10

## 2021-02-13 NOTE — Patient Instructions (Signed)
[facility]  Discharge Instructions: Thank you for choosing [facility] to provide your oncology and hematology care.   If you have a lab appointment with the [facility], please go directly to the [facility] and check in at the registration area.   Wear comfortable clothing and clothing appropriate for easy access to any Portacath or PICC line.   We strive to give you quality time with your provider. You may need to reschedule your appointment if you arrive late (15 or more minutes).  Arriving late affects you and other patients whose appointments are after yours.  Also, if you miss three or more appointments without notifying the office, you may be dismissed from the clinic at the provider's discretion.      For prescription refill requests, have your pharmacy contact our office and allow 72 hours for refills to be completed.    Today you received the following chemotherapy and/or immunotherapy agents Etopiside   To help prevent nausea and vomiting after your treatment, we encourage you to take your nausea medication as directed.  BELOW ARE SYMPTOMS THAT SHOULD BE REPORTED IMMEDIATELY: . *FEVER GREATER THAN 100.4 F (38 C) OR HIGHER . *CHILLS OR SWEATING . *NAUSEA AND VOMITING THAT IS NOT CONTROLLED WITH YOUR NAUSEA MEDICATION . *UNUSUAL SHORTNESS OF BREATH . *UNUSUAL BRUISING OR BLEEDING . *URINARY PROBLEMS (pain or burning when urinating, or frequent urination) . *BOWEL PROBLEMS (unusual diarrhea, constipation, pain near the anus) . TENDERNESS IN MOUTH AND THROAT WITH OR WITHOUT PRESENCE OF ULCERS (sore throat, sores in mouth, or a toothache) . UNUSUAL RASH, SWELLING OR PAIN  . UNUSUAL VAGINAL DISCHARGE OR ITCHING   Items with * indicate a potential emergency and should be followed up as soon as possible or go to the Emergency Department if any problems should occur.  Please show the CHEMOTHERAPY ALERT CARD or IMMUNOTHERAPY ALERT CARD  at check-in to the Emergency Department and triage nurse.  Should you have questions after your visit or need to cancel or reschedule your appointment, please contact [facility]  Dept: [phone]  and follow the prompts.  Office hours are 8:00 a.m. to 4:30 p.m. Monday - Friday. Please note that voicemails left after 4:00 p.m. may not be returned until the following business day.  We are closed weekends and major holidays. You have access to a nurse at all times for urgent questions. Please call the main number to the clinic Dept: [phone] and follow the prompts.   For any non-urgent questions, you may also contact your provider using MyChart. We now offer e-Visits for anyone 76 and older to request care online for non-urgent symptoms. For details visit [email or web address].   Also download the MyChart app! Go to the app store, search "MyChart", open the app, select [facility], and log in with your MyChart username and password.  Due to Covid, a mask is required upon entering the hospital/clinic. If you do not have a mask, one will be given to you upon arrival. For doctor visits, patients may have 1 support person aged 29 or older with them. For treatment visits, patients cannot have anyone with them due to current Covid guidelines and our immunocompromised population.

## 2021-02-15 ENCOUNTER — Other Ambulatory Visit: Payer: Self-pay

## 2021-02-15 ENCOUNTER — Inpatient Hospital Stay: Payer: PPO

## 2021-02-15 VITALS — BP 117/65 | HR 62 | Temp 98.2°F

## 2021-02-15 DIAGNOSIS — C3491 Malignant neoplasm of unspecified part of right bronchus or lung: Secondary | ICD-10-CM

## 2021-02-15 DIAGNOSIS — Z5111 Encounter for antineoplastic chemotherapy: Secondary | ICD-10-CM | POA: Diagnosis not present

## 2021-02-15 MED ORDER — PEGFILGRASTIM-CBQV 6 MG/0.6ML ~~LOC~~ SOSY
6.0000 mg | PREFILLED_SYRINGE | Freq: Once | SUBCUTANEOUS | Status: AC
  Administered 2021-02-15: 6 mg via SUBCUTANEOUS

## 2021-02-15 NOTE — Patient Instructions (Signed)
Pegfilgrastim injection What is this medicine? PEGFILGRASTIM (PEG fil gra stim) is a long-acting granulocyte colony-stimulating factor that stimulates the growth of neutrophils, a type of white blood cell important in the body's fight against infection. It is used to reduce the incidence of fever and infection in patients with certain types of cancer who are receiving chemotherapy that affects the bone marrow, and to increase survival after being exposed to high doses of radiation. This medicine may be used for other purposes; ask your health care provider or pharmacist if you have questions. COMMON BRAND NAME(S): Rexene Edison, Ziextenzo What should I tell my health care provider before I take this medicine? They need to know if you have any of these conditions:  kidney disease  latex allergy  ongoing radiation therapy  sickle cell disease  skin reactions to acrylic adhesives (On-Body Injector only)  an unusual or allergic reaction to pegfilgrastim, filgrastim, other medicines, foods, dyes, or preservatives  pregnant or trying to get pregnant  breast-feeding How should I use this medicine? This medicine is for injection under the skin. If you get this medicine at home, you will be taught how to prepare and give the pre-filled syringe or how to use the On-body Injector. Refer to the patient Instructions for Use for detailed instructions. Use exactly as directed. Tell your healthcare provider immediately if you suspect that the On-body Injector may not have performed as intended or if you suspect the use of the On-body Injector resulted in a missed or partial dose. It is important that you put your used needles and syringes in a special sharps container. Do not put them in a trash can. If you do not have a sharps container, call your pharmacist or healthcare provider to get one. Talk to your pediatrician regarding the use of this medicine in children. While this drug  may be prescribed for selected conditions, precautions do apply. Overdosage: If you think you have taken too much of this medicine contact a poison control center or emergency room at once. NOTE: This medicine is only for you. Do not share this medicine with others. What if I miss a dose? It is important not to miss your dose. Call your doctor or health care professional if you miss your dose. If you miss a dose due to an On-body Injector failure or leakage, a new dose should be administered as soon as possible using a single prefilled syringe for manual use. What may interact with this medicine? Interactions have not been studied. This list may not describe all possible interactions. Give your health care provider a list of all the medicines, herbs, non-prescription drugs, or dietary supplements you use. Also tell them if you smoke, drink alcohol, or use illegal drugs. Some items may interact with your medicine. What should I watch for while using this medicine? Your condition will be monitored carefully while you are receiving this medicine. You may need blood work done while you are taking this medicine. Talk to your health care provider about your risk of cancer. You may be more at risk for certain types of cancer if you take this medicine. If you are going to need a MRI, CT scan, or other procedure, tell your doctor that you are using this medicine (On-Body Injector only). What side effects may I notice from receiving this medicine? Side effects that you should report to your doctor or health care professional as soon as possible:  allergic reactions (skin rash, itching or hives, swelling of  the face, lips, or tongue)  back pain  dizziness  fever  pain, redness, or irritation at site where injected  pinpoint red spots on the skin  red or dark-brown urine  shortness of breath or breathing problems  stomach or side pain, or pain at the shoulder  swelling  tiredness  trouble  passing urine or change in the amount of urine  unusual bruising or bleeding Side effects that usually do not require medical attention (report to your doctor or health care professional if they continue or are bothersome):  bone pain  muscle pain This list may not describe all possible side effects. Call your doctor for medical advice about side effects. You may report side effects to FDA at 1-800-FDA-1088. Where should I keep my medicine? Keep out of the reach of children. If you are using this medicine at home, you will be instructed on how to store it. Throw away any unused medicine after the expiration date on the label. NOTE: This sheet is a summary. It may not cover all possible information. If you have questions about this medicine, talk to your doctor, pharmacist, or health care provider.  2021 Elsevier/Gold Standard (2019-10-06 13:20:51)

## 2021-02-17 ENCOUNTER — Telehealth: Payer: Self-pay

## 2021-02-17 NOTE — Telephone Encounter (Signed)
Pt called wanting to know if he can exercise (walk) a little bit. Pt states he feels his muscles are weakening because he isn't moving around.  I advised the pt he can start really slow, such as maybe walking to the end of his street and back if it's not too far. Pt was advise to not push himself too hard and to please use a walker or cain when he does so, so as not to hurt himself but he is encouraged to do safe, low impact excercise. Pt expressed understanding of this information.  These directions were confirmed with Cassandra PA-C. No additional directions for the pt at this time.

## 2021-02-18 ENCOUNTER — Inpatient Hospital Stay: Payer: PPO

## 2021-02-18 ENCOUNTER — Other Ambulatory Visit: Payer: Self-pay

## 2021-02-18 ENCOUNTER — Other Ambulatory Visit: Payer: PPO

## 2021-02-18 DIAGNOSIS — Z95828 Presence of other vascular implants and grafts: Secondary | ICD-10-CM

## 2021-02-18 DIAGNOSIS — C3491 Malignant neoplasm of unspecified part of right bronchus or lung: Secondary | ICD-10-CM

## 2021-02-18 DIAGNOSIS — Z5111 Encounter for antineoplastic chemotherapy: Secondary | ICD-10-CM | POA: Diagnosis not present

## 2021-02-18 LAB — CMP (CANCER CENTER ONLY)
ALT: <6 U/L (ref 0–44)
AST: 10 U/L — ABNORMAL LOW (ref 15–41)
Albumin: 3.2 g/dL — ABNORMAL LOW (ref 3.5–5.0)
Alkaline Phosphatase: 129 U/L — ABNORMAL HIGH (ref 38–126)
Anion gap: 6 (ref 5–15)
BUN: 33 mg/dL — ABNORMAL HIGH (ref 8–23)
CO2: 23 mmol/L (ref 22–32)
Calcium: 8.8 mg/dL — ABNORMAL LOW (ref 8.9–10.3)
Chloride: 109 mmol/L (ref 98–111)
Creatinine: 1.04 mg/dL (ref 0.61–1.24)
GFR, Estimated: >60 mL/min (ref >60)
Glucose, Bld: 105 mg/dL — ABNORMAL HIGH (ref 70–99)
Potassium: 5.4 mmol/L — ABNORMAL HIGH (ref 3.5–5.1)
Sodium: 138 mmol/L (ref 135–145)
Total Bilirubin: 0.3 mg/dL (ref 0.3–1.2)
Total Protein: 5.9 g/dL — ABNORMAL LOW (ref 6.5–8.1)

## 2021-02-18 LAB — CBC WITH DIFFERENTIAL (CANCER CENTER ONLY)
Abs Immature Granulocytes: 3.75 10*3/uL — ABNORMAL HIGH (ref 0.00–0.07)
Basophils Absolute: 0.1 10*3/uL (ref 0.0–0.1)
Basophils Relative: 1 %
Eosinophils Absolute: 0 10*3/uL (ref 0.0–0.5)
Eosinophils Relative: 0 %
HCT: 35.6 % — ABNORMAL LOW (ref 39.0–52.0)
Hemoglobin: 11.4 g/dL — ABNORMAL LOW (ref 13.0–17.0)
Immature Granulocytes: 22 %
Lymphocytes Relative: 1 %
Lymphs Abs: 0.2 10*3/uL — ABNORMAL LOW (ref 0.7–4.0)
MCH: 30.5 pg (ref 26.0–34.0)
MCHC: 32 g/dL (ref 30.0–36.0)
MCV: 95.2 fL (ref 80.0–100.0)
Monocytes Absolute: 0.4 10*3/uL (ref 0.1–1.0)
Monocytes Relative: 2 %
Neutro Abs: 12.7 10*3/uL — ABNORMAL HIGH (ref 1.7–7.7)
Neutrophils Relative %: 74 %
Platelet Count: 44 10*3/uL — ABNORMAL LOW (ref 150–400)
RBC: 3.74 MIL/uL — ABNORMAL LOW (ref 4.22–5.81)
RDW: 15.6 % — ABNORMAL HIGH (ref 11.5–15.5)
WBC Count: 17.1 10*3/uL — ABNORMAL HIGH (ref 4.0–10.5)
nRBC: 0 % (ref 0.0–0.2)

## 2021-02-18 MED ORDER — SODIUM CHLORIDE 0.9% FLUSH
10.0000 mL | Freq: Once | INTRAVENOUS | Status: AC
Start: 2021-02-18 — End: 2021-02-18
  Administered 2021-02-18: 10 mL
  Filled 2021-02-18: qty 10

## 2021-02-18 MED ORDER — HEPARIN SOD (PORK) LOCK FLUSH 100 UNIT/ML IV SOLN
500.0000 [IU] | Freq: Once | INTRAVENOUS | Status: AC
  Administered 2021-02-18: 500 [IU]
  Filled 2021-02-18: qty 5

## 2021-02-25 ENCOUNTER — Other Ambulatory Visit: Payer: Self-pay

## 2021-02-25 ENCOUNTER — Other Ambulatory Visit: Payer: PPO

## 2021-02-25 ENCOUNTER — Inpatient Hospital Stay: Payer: PPO

## 2021-02-25 ENCOUNTER — Telehealth: Payer: Self-pay | Admitting: *Deleted

## 2021-02-25 DIAGNOSIS — C3491 Malignant neoplasm of unspecified part of right bronchus or lung: Secondary | ICD-10-CM

## 2021-02-25 DIAGNOSIS — Z95828 Presence of other vascular implants and grafts: Secondary | ICD-10-CM

## 2021-02-25 DIAGNOSIS — Z5111 Encounter for antineoplastic chemotherapy: Secondary | ICD-10-CM | POA: Diagnosis not present

## 2021-02-25 LAB — CBC WITH DIFFERENTIAL (CANCER CENTER ONLY)
Abs Immature Granulocytes: 0.86 10*3/uL — ABNORMAL HIGH (ref 0.00–0.07)
Basophils Absolute: 0.1 10*3/uL (ref 0.0–0.1)
Basophils Relative: 0 %
Eosinophils Absolute: 0 10*3/uL (ref 0.0–0.5)
Eosinophils Relative: 0 %
HCT: 34.6 % — ABNORMAL LOW (ref 39.0–52.0)
Hemoglobin: 11.1 g/dL — ABNORMAL LOW (ref 13.0–17.0)
Immature Granulocytes: 5 %
Lymphocytes Relative: 2 %
Lymphs Abs: 0.3 10*3/uL — ABNORMAL LOW (ref 0.7–4.0)
MCH: 31.3 pg (ref 26.0–34.0)
MCHC: 32.1 g/dL (ref 30.0–36.0)
MCV: 97.5 fL (ref 80.0–100.0)
Monocytes Absolute: 1.5 10*3/uL — ABNORMAL HIGH (ref 0.1–1.0)
Monocytes Relative: 8 %
Neutro Abs: 15.4 10*3/uL — ABNORMAL HIGH (ref 1.7–7.7)
Neutrophils Relative %: 85 %
Platelet Count: 52 10*3/uL — ABNORMAL LOW (ref 150–400)
RBC: 3.55 MIL/uL — ABNORMAL LOW (ref 4.22–5.81)
RDW: 16.8 % — ABNORMAL HIGH (ref 11.5–15.5)
WBC Count: 18.2 10*3/uL — ABNORMAL HIGH (ref 4.0–10.5)
nRBC: 0.3 % — ABNORMAL HIGH (ref 0.0–0.2)

## 2021-02-25 LAB — CMP (CANCER CENTER ONLY)
ALT: 9 U/L (ref 0–44)
AST: 10 U/L — ABNORMAL LOW (ref 15–41)
Albumin: 3.2 g/dL — ABNORMAL LOW (ref 3.5–5.0)
Alkaline Phosphatase: 133 U/L — ABNORMAL HIGH (ref 38–126)
Anion gap: 11 (ref 5–15)
BUN: 19 mg/dL (ref 8–23)
CO2: 21 mmol/L — ABNORMAL LOW (ref 22–32)
Calcium: 8.5 mg/dL — ABNORMAL LOW (ref 8.9–10.3)
Chloride: 110 mmol/L (ref 98–111)
Creatinine: 1.05 mg/dL (ref 0.61–1.24)
GFR, Estimated: >60 mL/min (ref >60)
Glucose, Bld: 64 mg/dL — ABNORMAL LOW (ref 70–99)
Potassium: 4.9 mmol/L (ref 3.5–5.1)
Sodium: 142 mmol/L (ref 135–145)
Total Bilirubin: <0.2 mg/dL — ABNORMAL LOW (ref 0.3–1.2)
Total Protein: 5.8 g/dL — ABNORMAL LOW (ref 6.5–8.1)

## 2021-02-25 LAB — TSH: TSH: 1.259 u[IU]/mL (ref 0.320–4.118)

## 2021-02-25 MED ORDER — SODIUM CHLORIDE 0.9% FLUSH
10.0000 mL | Freq: Once | INTRAVENOUS | Status: AC
  Administered 2021-02-25: 10 mL
  Filled 2021-02-25: qty 10

## 2021-02-25 MED ORDER — HEPARIN SOD (PORK) LOCK FLUSH 100 UNIT/ML IV SOLN
500.0000 [IU] | Freq: Once | INTRAVENOUS | Status: AC
  Administered 2021-02-25: 500 [IU]
  Filled 2021-02-25: qty 5

## 2021-02-25 NOTE — Telephone Encounter (Signed)
02/07/2021.  "What is status of actually my FMLA form.  Hoping close to finishing for me to get there and help care for him if needed in the future and not need to reopen case."

## 2021-02-25 NOTE — Telephone Encounter (Signed)
02/25/2021 Connected with Shirley Muscat daughter Anderson Malta [phone]), regarding her FMLA form completed 02/21/2021 for needed start date, episode frequency and fax number for [employer] [place] [employer] [place].  "My father's form is for me.  Have not missed any days out of work yet.  Will need at least seven days at a time to travel long distance.  Request is for intermittent.  Not sure if he'll need help the week he receives treatment or the week after.  Working from home today so I will check e-mails to locate fax number and call back by tomorrow."

## 2021-02-25 NOTE — Telephone Encounter (Signed)
No new note, see 01/30/2021 phone encounter.

## 2021-02-25 NOTE — Telephone Encounter (Signed)
02/18/2021 "[facility].  Father is [name], a patient of Dr. Julien Nordmann.  He brought papers in.  Waiting for call regarding form.  FMLA was denied currently needs paperwork to re-open case."

## 2021-02-25 NOTE — Telephone Encounter (Signed)
5-02/2021 "Calling about my father's paperwork.  He turned into MD's office unaware it needed to go to another office.  If turned in, am I still in queue?  When do you think it will be ready?"

## 2021-02-27 NOTE — Telephone Encounter (Signed)
02/26/2021 12:48 pm  Message left for Tiernan Suto [phone]), requesting fax number to return FMLA request to employer.  Awaiting return call from daughter Azar South.

## 2021-03-03 ENCOUNTER — Telehealth: Payer: Self-pay | Admitting: *Deleted

## 2021-03-03 NOTE — Telephone Encounter (Signed)
"  [facility] [phone]) returning your calls to get me off your to do list.  E-mail the form to me at [email or web address].  I will forward form to appropriate person.  Personally out of work with diverticulitis till tele-working today is why I was unable to call.  Have been dealing with this for years."

## 2021-03-03 NOTE — Telephone Encounter (Signed)
See 01/30/2021 phone encounter.

## 2021-03-04 ENCOUNTER — Inpatient Hospital Stay: Payer: PPO | Admitting: Dietician

## 2021-03-04 ENCOUNTER — Encounter: Payer: Self-pay | Admitting: Internal Medicine

## 2021-03-04 ENCOUNTER — Other Ambulatory Visit: Payer: Self-pay

## 2021-03-04 ENCOUNTER — Inpatient Hospital Stay: Payer: PPO

## 2021-03-04 ENCOUNTER — Other Ambulatory Visit: Payer: PPO

## 2021-03-04 ENCOUNTER — Inpatient Hospital Stay: Payer: PPO | Attending: Neurological Surgery | Admitting: Internal Medicine

## 2021-03-04 VITALS — BP 111/67 | HR 66 | Temp 97.7°F | Resp 18 | Ht 69.0 in | Wt 160.3 lb

## 2021-03-04 DIAGNOSIS — R5383 Other fatigue: Secondary | ICD-10-CM | POA: Diagnosis not present

## 2021-03-04 DIAGNOSIS — I959 Hypotension, unspecified: Secondary | ICD-10-CM | POA: Insufficient documentation

## 2021-03-04 DIAGNOSIS — Z95828 Presence of other vascular implants and grafts: Secondary | ICD-10-CM

## 2021-03-04 DIAGNOSIS — F17210 Nicotine dependence, cigarettes, uncomplicated: Secondary | ICD-10-CM | POA: Diagnosis not present

## 2021-03-04 DIAGNOSIS — Z5111 Encounter for antineoplastic chemotherapy: Secondary | ICD-10-CM | POA: Insufficient documentation

## 2021-03-04 DIAGNOSIS — C3490 Malignant neoplasm of unspecified part of unspecified bronchus or lung: Secondary | ICD-10-CM

## 2021-03-04 DIAGNOSIS — C3491 Malignant neoplasm of unspecified part of right bronchus or lung: Secondary | ICD-10-CM | POA: Insufficient documentation

## 2021-03-04 DIAGNOSIS — Z5112 Encounter for antineoplastic immunotherapy: Secondary | ICD-10-CM

## 2021-03-04 DIAGNOSIS — C7971 Secondary malignant neoplasm of right adrenal gland: Secondary | ICD-10-CM | POA: Insufficient documentation

## 2021-03-04 DIAGNOSIS — Z5189 Encounter for other specified aftercare: Secondary | ICD-10-CM | POA: Insufficient documentation

## 2021-03-04 DIAGNOSIS — C7972 Secondary malignant neoplasm of left adrenal gland: Secondary | ICD-10-CM | POA: Diagnosis not present

## 2021-03-04 DIAGNOSIS — Z7982 Long term (current) use of aspirin: Secondary | ICD-10-CM | POA: Diagnosis not present

## 2021-03-04 DIAGNOSIS — Z79899 Other long term (current) drug therapy: Secondary | ICD-10-CM | POA: Insufficient documentation

## 2021-03-04 DIAGNOSIS — C7951 Secondary malignant neoplasm of bone: Secondary | ICD-10-CM | POA: Diagnosis not present

## 2021-03-04 DIAGNOSIS — C7931 Secondary malignant neoplasm of brain: Secondary | ICD-10-CM | POA: Diagnosis not present

## 2021-03-04 LAB — CMP (CANCER CENTER ONLY)
ALT: <6 U/L (ref 0–44)
AST: 8 U/L — ABNORMAL LOW (ref 15–41)
Albumin: 3 g/dL — ABNORMAL LOW (ref 3.5–5.0)
Alkaline Phosphatase: 113 U/L (ref 38–126)
Anion gap: 10 (ref 5–15)
BUN: 16 mg/dL (ref 8–23)
CO2: 21 mmol/L — ABNORMAL LOW (ref 22–32)
Calcium: 8.7 mg/dL — ABNORMAL LOW (ref 8.9–10.3)
Chloride: 109 mmol/L (ref 98–111)
Creatinine: 0.94 mg/dL (ref 0.61–1.24)
GFR, Estimated: >60 mL/min (ref >60)
Glucose, Bld: 94 mg/dL (ref 70–99)
Potassium: 4.7 mmol/L (ref 3.5–5.1)
Sodium: 140 mmol/L (ref 135–145)
Total Bilirubin: 0.2 mg/dL — ABNORMAL LOW (ref 0.3–1.2)
Total Protein: 5.8 g/dL — ABNORMAL LOW (ref 6.5–8.1)

## 2021-03-04 LAB — CBC WITH DIFFERENTIAL (CANCER CENTER ONLY)
Abs Immature Granulocytes: 0.13 10*3/uL — ABNORMAL HIGH (ref 0.00–0.07)
Basophils Absolute: 0.1 10*3/uL (ref 0.0–0.1)
Basophils Relative: 1 %
Eosinophils Absolute: 0.1 10*3/uL (ref 0.0–0.5)
Eosinophils Relative: 1 %
HCT: 35 % — ABNORMAL LOW (ref 39.0–52.0)
Hemoglobin: 11.1 g/dL — ABNORMAL LOW (ref 13.0–17.0)
Immature Granulocytes: 1 %
Lymphocytes Relative: 2 %
Lymphs Abs: 0.2 10*3/uL — ABNORMAL LOW (ref 0.7–4.0)
MCH: 30.9 pg (ref 26.0–34.0)
MCHC: 31.7 g/dL (ref 30.0–36.0)
MCV: 97.5 fL (ref 80.0–100.0)
Monocytes Absolute: 1.4 10*3/uL — ABNORMAL HIGH (ref 0.1–1.0)
Monocytes Relative: 13 %
Neutro Abs: 8.9 10*3/uL — ABNORMAL HIGH (ref 1.7–7.7)
Neutrophils Relative %: 82 %
Platelet Count: 158 10*3/uL (ref 150–400)
RBC: 3.59 MIL/uL — ABNORMAL LOW (ref 4.22–5.81)
RDW: 17.8 % — ABNORMAL HIGH (ref 11.5–15.5)
WBC Count: 10.8 10*3/uL — ABNORMAL HIGH (ref 4.0–10.5)
nRBC: 0 % (ref 0.0–0.2)

## 2021-03-04 MED ORDER — SODIUM CHLORIDE 0.9 % IV SOLN
451.0000 mg | Freq: Once | INTRAVENOUS | Status: AC
  Administered 2021-03-04: 450 mg via INTRAVENOUS
  Filled 2021-03-04: qty 45

## 2021-03-04 MED ORDER — PALONOSETRON HCL INJECTION 0.25 MG/5ML
0.2500 mg | Freq: Once | INTRAVENOUS | Status: AC
  Administered 2021-03-04: 0.25 mg via INTRAVENOUS

## 2021-03-04 MED ORDER — SODIUM CHLORIDE 0.9 % IV SOLN
150.0000 mg | Freq: Once | INTRAVENOUS | Status: AC
  Administered 2021-03-04: 150 mg via INTRAVENOUS
  Filled 2021-03-04: qty 150

## 2021-03-04 MED ORDER — SODIUM CHLORIDE 0.9 % IV SOLN
100.0000 mg/m2 | Freq: Once | INTRAVENOUS | Status: AC
  Administered 2021-03-04: 200 mg via INTRAVENOUS
  Filled 2021-03-04: qty 10

## 2021-03-04 MED ORDER — SODIUM CHLORIDE 0.9 % IV SOLN
10.0000 mg | Freq: Once | INTRAVENOUS | Status: AC
  Administered 2021-03-04: 10 mg via INTRAVENOUS
  Filled 2021-03-04: qty 10

## 2021-03-04 MED ORDER — SODIUM CHLORIDE 0.9 % IV SOLN
1500.0000 mg | Freq: Once | INTRAVENOUS | Status: AC
  Administered 2021-03-04: 1500 mg via INTRAVENOUS
  Filled 2021-03-04: qty 30

## 2021-03-04 MED ORDER — HEPARIN SOD (PORK) LOCK FLUSH 100 UNIT/ML IV SOLN
500.0000 [IU] | Freq: Once | INTRAVENOUS | Status: DC | PRN
  Filled 2021-03-04: qty 5

## 2021-03-04 MED ORDER — SODIUM CHLORIDE 0.9 % IV SOLN
Freq: Once | INTRAVENOUS | Status: AC
  Filled 2021-03-04: qty 250

## 2021-03-04 MED ORDER — PALONOSETRON HCL INJECTION 0.25 MG/5ML
INTRAVENOUS | Status: AC
  Filled 2021-03-04: qty 5

## 2021-03-04 MED ORDER — SODIUM CHLORIDE 0.9% FLUSH
10.0000 mL | INTRAVENOUS | Status: DC | PRN
  Filled 2021-03-04: qty 10

## 2021-03-04 MED ORDER — TRILACICLIB DIHYDROCHLORIDE INJECTION 300 MG
240.0000 mg/m2 | Freq: Once | INTRAVENOUS | Status: AC
  Administered 2021-03-04: 465 mg via INTRAVENOUS
  Filled 2021-03-04: qty 31

## 2021-03-04 MED ORDER — SODIUM CHLORIDE 0.9% FLUSH
10.0000 mL | Freq: Once | INTRAVENOUS | Status: AC
  Administered 2021-03-04: 10 mL
  Filled 2021-03-04: qty 10

## 2021-03-04 NOTE — Patient Instructions (Signed)
Steps to Quit Smoking Smoking tobacco is the leading cause of preventable death. It can affect almost every organ in the body. Smoking puts you and people around you at risk for many serious, long-lasting (chronic) diseases. Quitting smoking can be hard, but it is one of the best things that you can do for your health. It is never too late to quit. How do I get ready to quit? When you decide to quit smoking, make a plan to help you succeed. Before you quit:  Pick a date to quit. Set a date within the next 2 weeks to give you time to prepare.  Write down the reasons why you are quitting. Keep this list in places where you will see it often.  Tell your family, friends, and co-workers that you are quitting. Their support is important.  Talk with your doctor about the choices that may help you quit.  Find out if your health insurance will pay for these treatments.  Know the people, places, things, and activities that make you want to smoke (triggers). Avoid them. What first steps can I take to quit smoking?  Throw away all cigarettes at home, at work, and in your car.  Throw away the things that you use when you smoke, such as ashtrays and lighters.  Clean your car. Make sure to empty the ashtray.  Clean your home, including curtains and carpets. What can I do to help me quit smoking? Talk with your doctor about taking medicines and seeing a counselor at the same time. You are more likely to succeed when you do both.  If you are pregnant or breastfeeding, talk with your doctor about counseling or other ways to quit smoking. Do not take medicine to help you quit smoking unless your doctor tells you to do so. To quit smoking: Quit right away  Quit smoking totally, instead of slowly cutting back on how much you smoke over a period of time.  Go to counseling. You are more likely to quit if you go to counseling sessions regularly. Take medicine You may take medicines to help you quit. Some  medicines need a prescription, and some you can buy over-the-counter. Some medicines may contain a drug called nicotine to replace the nicotine in cigarettes. Medicines may:  Help you to stop having the desire to smoke (cravings).  Help to stop the problems that come when you stop smoking (withdrawal symptoms). Your doctor may ask you to use:  Nicotine patches, gum, or lozenges.  Nicotine inhalers or sprays.  Non-nicotine medicine that is taken by mouth. Find resources Find resources and other ways to help you quit smoking and remain smoke-free after you quit. These resources are most helpful when you use them often. They include:  Online chats with a Social worker.  Phone quitlines.  Printed Furniture conservator/restorer.  Support groups or group counseling.  Text messaging programs.  Mobile phone apps. Use apps on your mobile phone or tablet that can help you stick to your quit plan. There are many free apps for mobile phones and tablets as well as websites. Examples include Quit Guide from the [employer] and smokefree.gov   What things can I do to make it easier to quit?  Talk to your family and friends. Ask them to support and encourage you.  Call a phone quitline (1-800-QUIT-NOW), reach out to support groups, or work with a Social worker.  Ask people who smoke to not smoke around you.  Avoid places that make you want to smoke,  such as: ? Bars. ? Parties. ? Smoke-break areas at work.  Spend time with people who do not smoke.  Lower the stress in your life. Stress can make you want to smoke. Try these things to help your stress: ? Getting regular exercise. ? Doing deep-breathing exercises. ? Doing yoga. ? Meditating. ? Doing a body scan. To do this, close your eyes, focus on one area of your body at a time from head to toe. Notice which parts of your body are tense. Try to relax the muscles in those areas.   How will I feel when I quit smoking? Day 1 to 3 weeks Within the first 24 hours,  you may start to have some problems that come from quitting tobacco. These problems are very bad 2-3 days after you quit, but they do not often last for more than 2-3 weeks. You may get these symptoms:  Mood swings.  Feeling restless, nervous, angry, or annoyed.  Trouble concentrating.  Dizziness.  Strong desire for high-sugar foods and nicotine.  Weight gain.  Trouble pooping (constipation).  Feeling like you may vomit (nausea).  Coughing or a sore throat.  Changes in how the medicines that you take for other issues work in your body.  Depression.  Trouble sleeping (insomnia). Week 3 and afterward After the first 2-3 weeks of quitting, you may start to notice more positive results, such as:  Better sense of smell and taste.  Less coughing and sore throat.  Slower heart rate.  Lower blood pressure.  Clearer skin.  Better breathing.  Fewer sick days. Quitting smoking can be hard. Do not give up if you fail the first time. Some people need to try a few times before they succeed. Do your best to stick to your quit plan, and talk with your doctor if you have any questions or concerns. Summary  Smoking tobacco is the leading cause of preventable death. Quitting smoking can be hard, but it is one of the best things that you can do for your health.  When you decide to quit smoking, make a plan to help you succeed.  Quit smoking right away, not slowly over a period of time.  When you start quitting, seek help from your doctor, family, or friends. This information is not intended to replace advice given to you by your health care provider. Make sure you discuss any questions you have with your health care provider. Document Revised: 06/09/2019 Document Reviewed: 12/03/2018 Elsevier Patient Education  [place].

## 2021-03-04 NOTE — Patient Instructions (Signed)
[facility]  Discharge Instructions: Thank you for choosing [facility] to provide your oncology and hematology care.   If you have a lab appointment with the [facility], please go directly to the [facility] and check in at the registration area.   Wear comfortable clothing and clothing appropriate for easy access to any Portacath or PICC line.   We strive to give you quality time with your provider. You may need to reschedule your appointment if you arrive late (15 or more minutes).  Arriving late affects you and other patients whose appointments are after yours.  Also, if you miss three or more appointments without notifying the office, you may be dismissed from the clinic at the provider's discretion.      For prescription refill requests, have your pharmacy contact our office and allow 72 hours for refills to be completed.    Today you received the following chemotherapy and/or immunotherapy agents imfinzi, carboplatin, etoposide   To help prevent nausea and vomiting after your treatment, we encourage you to take your nausea medication as directed.  BELOW ARE SYMPTOMS THAT SHOULD BE REPORTED IMMEDIATELY: . *FEVER GREATER THAN 100.4 F (38 C) OR HIGHER . *CHILLS OR SWEATING . *NAUSEA AND VOMITING THAT IS NOT CONTROLLED WITH YOUR NAUSEA MEDICATION . *UNUSUAL SHORTNESS OF BREATH . *UNUSUAL BRUISING OR BLEEDING . *URINARY PROBLEMS (pain or burning when urinating, or frequent urination) . *BOWEL PROBLEMS (unusual diarrhea, constipation, pain near the anus) . TENDERNESS IN MOUTH AND THROAT WITH OR WITHOUT PRESENCE OF ULCERS (sore throat, sores in mouth, or a toothache) . UNUSUAL RASH, SWELLING OR PAIN  . UNUSUAL VAGINAL DISCHARGE OR ITCHING   Items with * indicate a potential emergency and should be followed up as soon as possible or go to the Emergency Department if any problems should occur.  Please show the CHEMOTHERAPY ALERT CARD or  IMMUNOTHERAPY ALERT CARD at check-in to the Emergency Department and triage nurse.  Should you have questions after your visit or need to cancel or reschedule your appointment, please contact [facility]  Dept: [phone]  and follow the prompts.  Office hours are 8:00 a.m. to 4:30 p.m. Monday - Friday. Please note that voicemails left after 4:00 p.m. may not be returned until the following business day.  We are closed weekends and major holidays. You have access to a nurse at all times for urgent questions. Please call the main number to the clinic Dept: [phone] and follow the prompts.   For any non-urgent questions, you may also contact your provider using MyChart. We now offer e-Visits for anyone 16 and older to request care online for non-urgent symptoms. For details visit [email or web address].   Also download the MyChart app! Go to the app store, search "MyChart", open the app, select [facility], and log in with your MyChart username and password.  Due to Covid, a mask is required upon entering the hospital/clinic. If you do not have a mask, one will be given to you upon arrival. For doctor visits, patients may have 1 support person aged 70 or older with them. For treatment visits, patients cannot have anyone with them due to current Covid guidelines and our immunocompromised population.

## 2021-03-04 NOTE — Progress Notes (Signed)
[facility] Telephone:([phone]   [phone]  OFFICE PROGRESS NOTE  [name], MD [address] [place] [address]  DIAGNOSIS: extensive stage (T2 a, N2, M1 C) small cell lung cancer presented with right hilar mass in addition to mediastinal lymphadenopathy as well as metastatic disease to the bone with complete replacement of the L5 status post laminectomy with resection of epidural tumor as well as metastatic disease to the brain, bones and bilateral adrenal glands diagnosed in January 2022.   PRIOR THERAPY: Status post palliative radiotherapy to the resected area at L5 under the care of Dr. Lisbeth Renshaw.  CURRENT THERAPY: Systemic chemotherapy with carboplatin for AUC of 5 from day 1, etoposide 100 mg/M2 on days 1, 2 and 3 with Cosela 240 mg/M2 before chemotherapy in addition to Imfinzi 1500 mg IV every 3 weeks with day one of the chemotherapy.  First dose December 24, 2020.  Status post 3 cycles.  INTERVAL HISTORY: Gregory Hodges 80 y.o. male returns to the clinic today for follow-up visit.  The patient is feeling fine today with no concerning complaints except for mild fatigue.  The patient also lost few pounds since his last visit.  He denied having any current chest pain, shortness of breath, cough or hemoptysis.  He denied having any fever or chills.  He has no nausea, vomiting, diarrhea or constipation.  He has no headache or visual changes.  He continues to tolerate his systemic chemotherapy fairly well.  He is here today for evaluation before starting cycle #4 of his treatment.  MEDICAL HISTORY: Past Medical History:  Diagnosis Date  . 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 Cancer (HCC)    Skin   . Crohn's disease ([facility])   . E coli bacteremia   . History of kidney stones   . Hyperlipemia   . Hypertension    "denies"  . Myocardial infarction ([facility]) 08/11/2005   s/p Horizon study stent D1  . Peripheral vascular disease [facility]) June 2011  . SCLC (small cell lung carcinoma) ([facility]) dx'd 10/2020  . 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  MEDICATIONS:  Current Outpatient Medications  Medication Sig Dispense Refill  . acetaminophen (TYLENOL) 500 MG tablet Take 500 mg by mouth every 6 (six) hours as needed for moderate pain.    Marland Kitchen aspirin 81 MG tablet Take 81 mg by mouth daily.    Marland Kitchen atenolol (TENORMIN) 25 MG tablet Take 25 mg by mouth 2 (two) times daily.    . calcium carbonate (TUMS - DOSED IN MG ELEMENTAL CALCIUM) 500 MG chewable tablet Chew 2 tablets by mouth daily as needed for indigestion or heartburn.    . diphenhydrAMINE (BENADRYL) 25 MG tablet Take 50 mg by mouth daily as needed for allergies.    . ferrous sulfate 325 (65 FE) MG tablet Take 325 mg by mouth every Monday, Wednesday, and Friday.    . lidocaine-prilocaine (EMLA) cream Apply to the Port-A-Cath site 30-60 minutes before treatment 30 g 0  . nitroGLYCERIN (NITROSTAT) 0.4 MG SL tablet Place 0.4 mg under the tongue every 5 (five) minutes as needed for chest pain.    Marland Kitchen omeprazole (PRILOSEC) 10 MG capsule Take 10 mg  by mouth daily.    Marland Kitchen oxymetazoline (AFRIN) 0.05 % nasal spray Place 1 spray into both nostrils 2 (two) times daily as needed for congestion.    . prochlorperazine (COMPAZINE) 10 MG tablet Take 1 tablet (10 mg total) by mouth every 6 (six) hours as needed for nausea or vomiting. 30 tablet 0  . rosuvastatin (CRESTOR) 10 MG tablet Take 10 mg by mouth daily.    Marland Kitchen sulfamethoxazole-trimethoprim (BACTRIM DS) 800-160 MG per tablet Take 1 tablet by mouth 2 (two) times daily. 800-160 mg 60 tablet 11   No current facility-administered medications for this  visit.    SURGICAL HISTORY:  Past Surgical History:  Procedure Laterality Date  . ABDOMINAL AORTIC ANEURYSM REPAIR  03/03/2010  . AXILLARY-FEMORAL BYPASS GRAFT  07/22/2010   bilateral  . bowel     herniated bowel  . CARDIAC CATHETERIZATION    . CORONARY STENT PLACEMENT    . CYSTOSCOPY    . IR IMAGING GUIDED PORT INSERTION  12/27/2020  . LAMINECTOMY N/A 10/22/2020   Procedure: Lumbar five Open Laminectomy for tumor resection;  Surgeon: Judith Part, MD;  Location: [facility];  Service: Neurosurgery;  Laterality: N/A;  . MOHS SURGERY     several     REVIEW OF SYSTEMS:  A comprehensive review of systems was negative except for: Constitutional: positive for fatigue and weight loss   PHYSICAL EXAMINATION: General appearance: alert, cooperative, fatigued and no distress Head: Normocephalic, without obvious abnormality, atraumatic Neck: no adenopathy, no JVD, supple, symmetrical, trachea midline and thyroid not enlarged, symmetric, no tenderness/mass/nodules Lymph nodes: Cervical, supraclavicular, and axillary nodes normal. Resp: clear to auscultation bilaterally Back: symmetric, no curvature. ROM normal. No CVA tenderness. Cardio: regular rate and rhythm, S1, S2 normal, no murmur, click, rub or gallop GI: soft, non-tender; bowel sounds normal; no masses,  no organomegaly Extremities: extremities normal, atraumatic, no cyanosis or edema  ECOG PERFORMANCE STATUS: 1 - Symptomatic but completely ambulatory  Blood pressure 111/67, pulse 66, temperature 97.7 F (36.5 C), temperature source Tympanic, resp. rate 18, height 5' 9"  (1.753 m), weight 160 lb 4.8 oz (72.7 kg), SpO2 94 %.  LABORATORY DATA: Lab Results  Component Value Date   WBC 10.8 (H) 03/04/2021   HGB 11.1 (L) 03/04/2021   HCT 35.0 (L) 03/04/2021   MCV 97.5 03/04/2021   PLT 158 03/04/2021      Chemistry      Component Value Date/Time   NA 142 02/25/2021 1245   K 4.9 02/25/2021 1245   CL 110 02/25/2021 1245   CO2 21  (L) 02/25/2021 1245   BUN 19 02/25/2021 1245   CREATININE 1.05 02/25/2021 1245      Component Value Date/Time   CALCIUM 8.5 (L) 02/25/2021 1245   ALKPHOS 133 (H) 02/25/2021 1245   AST 10 (L) 02/25/2021 1245   ALT 9 02/25/2021 1245   BILITOT <0.2 (L) 02/25/2021 1245       RADIOGRAPHIC STUDIES: CT Chest W Contrast  Result Date: 02/11/2021 CLINICAL DATA:  SCL Ca to bone, adrenal gland and lung; chemo and immunotherpy ongoing; XRT ongoing; wt loss; decreased appetite EXAM: CT CHEST, ABDOMEN, AND PELVIS WITH CONTRAST TECHNIQUE: Multidetector CT imaging of the chest, abdomen and pelvis was performed following the standard protocol during bolus administration of intravenous contrast. CONTRAST:  11m OMNIPAQUE IOHEXOL 300 MG/ML  SOLN COMPARISON:  CT 10/16/2020, PET-CT 12/19/2020 FINDINGS: CT CHEST FINDINGS Cardiovascular: Port in the anterior chest wall with tip in distal SVC. Coronary artery calcification and  aortic atherosclerotic calcification. Bilateral ax fem vascular grafts noted. Mediastinum/Nodes: Decrease in prominence of RIGHT hilar lymph nodes. No pathologically enlarged lymph nodes remain. No new adenopathy in the mediastinum. Lungs/Pleura: Interval resolution of the RIGHT lower lobe pulmonary nodule seen on comparison FDG PET scan. No new nodularity. Large hiatal hernia extends into the LEFT lower hemithorax. Musculoskeletal: Destructive rib lesion along the posterior RIGHT seventh rib again noted with pathologic fracture. No advancement. Similar rib lesion in the anterior LEFT rib (image 69/6). No new skeletal lesions identified CT ABDOMEN AND PELVIS FINDINGS Hepatobiliary: Multiple small hepatic cysts. Mild gallbladder wall thickening. Common bile duct is prominent at 10 mm unchanged from prior. Pancreas: Pancreas is normal. No ductal dilatation. No pancreatic inflammation. Spleen: Normal spleen Adrenals/urinary tract: Interval decrease in size of the LEFT adrenal gland which measures 9 mm in  short axis compared to 18 mm on prior. RIGHT adrenal gland is now normal size. Low-density cyst lower pole of the LEFT kidney. Nonobstructing RIGHT renal calculi. Ureters and bladder normal Stomach/Bowel: Large hiatal hernia with majority of stomach above the LEFT hemidiaphragm. Stomach otherwise normal. Small bowel and colon unremarkable. 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 No retroperitoneal or upper abdominal adenopathy. Reproductive: Unremarkable Other: No peritoneal metastasis Musculoskeletal: Post sclerotic lesion is subtly evident in the RIGHT acetabulum. Lesion much more obvious on comparison FDG PET scan. No skeletal metastasis identified. IMPRESSION: Chest Impression: 1. Marked improvement in RIGHT hilar and mediastinal lymphadenopathy. 2. Resolution of RIGHT lower lobe pulmonary nodule. 3. 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 Abdomen / Pelvis Impression: 1. 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 2. No evidence disease progression in the abdomen pelvis. 3. 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 Electronically Signed   By: Suzy Bouchard M.D.   On: 02/11/2021 08:48   MR Brain W Wo Contrast  Result Date: 02/11/2021 CLINICAL DATA:  Small cell lung cancer. Follow-up treated metastatic disease to brain. EXAM: MRI HEAD WITHOUT AND WITH CONTRAST TECHNIQUE: Multiplanar, multiecho pulse sequences of the brain and surrounding structures were obtained without and with intravenous contrast. CONTRAST:  7.7m GADAVIST GADOBUTROL 1 MMOL/ML IV SOLN COMPARISON:  MRI head with contrast 12/17/2020 FINDINGS: Brain: Interval improvement in multiple enhancing lesions compatible with treated metastatic disease. 5 mm enhancing lesion right lower cerebellum significantly smaller. Additional lesions in the right lateral cerebellum and left inferior cerebellum no longer visualized. Enhancing lesion left lateral temporal lobe is  significantly smaller although shows interval hemorrhage. Left frontal enhancing lesion significantly smaller Second lesion in the anterior left frontal lobe significantly smaller and difficult to identify. Small amount of associated hemorrhage No new enhancing lesions. Ventricle size normal. Moderate to extensive changes throughout the white matter and pons similar to the prior study. No acute infarct. Vascular: Normal arterial flow voids. Skull and upper cervical spine: No focal skeletal lesion identified. Sinuses/Orbits: Negative Other: None IMPRESSION: Significant improvement in 6 metastatic deposits in the brain which are smaller or no longer visible. Mild interval hemorrhage in the left lateral temporal lesion. No new lesions. Moderate to extensive changes in the white matter and pons compatible with chronic microvascular ischemia. Electronically Signed   By: CFranchot GalloM.D.   On: 02/11/2021 08:44   CT Abdomen Pelvis W Contrast  Result Date: 02/11/2021 CLINICAL DATA:  SCL Ca to bone, adrenal gland and lung; chemo and immunotherpy ongoing; XRT ongoing; wt loss; decreased appetite EXAM: CT CHEST, ABDOMEN, AND PELVIS WITH CONTRAST  TECHNIQUE: Multidetector CT imaging of the chest, abdomen and pelvis was performed following the standard protocol during bolus administration of intravenous contrast. CONTRAST:  21m OMNIPAQUE IOHEXOL 300 MG/ML  SOLN COMPARISON:  CT 10/16/2020, PET-CT 12/19/2020 FINDINGS: CT CHEST FINDINGS Cardiovascular: Port in the anterior chest wall with tip in distal SVC. Coronary artery calcification and aortic atherosclerotic calcification. Bilateral ax fem vascular grafts noted. Mediastinum/Nodes: Decrease in prominence of RIGHT hilar lymph nodes. No pathologically enlarged lymph nodes remain. No new adenopathy in the mediastinum. Lungs/Pleura: Interval resolution of the RIGHT lower lobe pulmonary nodule seen on comparison FDG PET scan. No new nodularity. Large hiatal hernia extends  into the LEFT lower hemithorax. Musculoskeletal: Destructive rib lesion along the posterior RIGHT seventh rib again noted with pathologic fracture. No advancement. Similar rib lesion in the anterior LEFT rib (image 69/6). No new skeletal lesions identified CT ABDOMEN AND PELVIS FINDINGS Hepatobiliary: Multiple small hepatic cysts. Mild gallbladder wall thickening. Common bile duct is prominent at 10 mm unchanged from prior. Pancreas: Pancreas is normal. No ductal dilatation. No pancreatic inflammation. Spleen: Normal spleen Adrenals/urinary tract: Interval decrease in size of the LEFT adrenal gland which measures 9 mm in short axis compared to 18 mm on prior. RIGHT adrenal gland is now normal size. Low-density cyst lower pole of the LEFT kidney. Nonobstructing RIGHT renal calculi. Ureters and bladder normal Stomach/Bowel: Large hiatal hernia with majority of stomach above the LEFT hemidiaphragm. Stomach otherwise normal. Small bowel and colon unremarkable. 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 No retroperitoneal or upper abdominal adenopathy. Reproductive: Unremarkable Other: No peritoneal metastasis Musculoskeletal: Post sclerotic lesion is subtly evident in the RIGHT acetabulum. Lesion much more obvious on comparison FDG PET scan. No skeletal metastasis identified. IMPRESSION: Chest Impression: 1. Marked improvement in RIGHT hilar and mediastinal lymphadenopathy. 2. Resolution of RIGHT lower lobe pulmonary nodule. 3. 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 Abdomen / Pelvis Impression: 1. 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 2. No evidence disease progression in the abdomen pelvis. 3. 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 Electronically Signed   By: SSuzy BouchardM.D.   On: 02/11/2021 08:48    ASSESSMENT AND PLAN: This is a very pleasant 80years old white male recently diagnosed with extensive stage (T2 a, N2,  M1 C) small cell lung cancer presented with right upper lobe lesion with extensive mediastinal and right hilar adenopathy in addition to extensive bone metastasis as well as metastatic disease to the adrenal gland bilaterally and multiple brain lesions diagnosed in February 2022 status post resection of the epidural tumor at the L5 as well as palliative radiotherapy to the lesion after resection. The patient is currently undergoing systemic chemotherapy with carboplatin for AUC of 5 on day 1, etoposide 100 Mg/M2 on days 1, 2 and 3 with Cosela on the days of the chemotherapy.  Status post 3 cycles He also required Neulasta injection recently. He has good response systemically and in the brain after the first 2 cycles of his treatment.  I recommended for the patient to proceed with cycle #4 today as planned. I will see him back for follow-up visit in 3 weeks for evaluation with repeat CT scan of the chest, abdomen pelvis for restaging of his disease. He was advised to call immediately if he has any concerning symptoms in the interval. The patient voices understanding of current disease status and treatment options and is in agreement with the current care plan.  All questions were answered. The patient knows to call the clinic with any problems, questions or concerns. We can certainly see the patient much sooner if necessary. The total time spent in the appointment was 60 minutes.  Disclaimer: This note was dictated with voice recognition software. Similar sounding words can inadvertently be transcribed and may not be corrected upon review.

## 2021-03-05 ENCOUNTER — Encounter: Payer: Self-pay | Admitting: Internal Medicine

## 2021-03-05 ENCOUNTER — Inpatient Hospital Stay: Payer: PPO

## 2021-03-05 VITALS — BP 116/62 | HR 74 | Temp 98.2°F | Resp 18

## 2021-03-05 DIAGNOSIS — C3491 Malignant neoplasm of unspecified part of right bronchus or lung: Secondary | ICD-10-CM

## 2021-03-05 DIAGNOSIS — Z5111 Encounter for antineoplastic chemotherapy: Secondary | ICD-10-CM | POA: Diagnosis not present

## 2021-03-05 MED ORDER — SODIUM CHLORIDE 0.9% FLUSH
10.0000 mL | INTRAVENOUS | Status: DC | PRN
  Administered 2021-03-05: 10 mL
  Filled 2021-03-05: qty 10

## 2021-03-05 MED ORDER — SODIUM CHLORIDE 0.9 % IV SOLN
100.0000 mg/m2 | Freq: Once | INTRAVENOUS | Status: AC
  Administered 2021-03-05: 200 mg via INTRAVENOUS
  Filled 2021-03-05: qty 10

## 2021-03-05 MED ORDER — SODIUM CHLORIDE 0.9 % IV SOLN
Freq: Once | INTRAVENOUS | Status: AC
  Filled 2021-03-05: qty 250

## 2021-03-05 MED ORDER — SODIUM CHLORIDE 0.9 % IV SOLN
10.0000 mg | Freq: Once | INTRAVENOUS | Status: AC
  Administered 2021-03-05: 10 mg via INTRAVENOUS
  Filled 2021-03-05: qty 10

## 2021-03-05 MED ORDER — TRILACICLIB DIHYDROCHLORIDE INJECTION 300 MG
240.0000 mg/m2 | Freq: Once | INTRAVENOUS | Status: AC
  Administered 2021-03-05: 465 mg via INTRAVENOUS
  Filled 2021-03-05: qty 31

## 2021-03-05 MED ORDER — HEPARIN SOD (PORK) LOCK FLUSH 100 UNIT/ML IV SOLN
500.0000 [IU] | Freq: Once | INTRAVENOUS | Status: AC | PRN
  Administered 2021-03-05: 500 [IU]
  Filled 2021-03-05: qty 5

## 2021-03-05 NOTE — Progress Notes (Signed)
Nutrition Assessment   Reason for Assessment: MST   ASSESSMENT: 80 year old male with small cell lung cancer of right lung metastatic to bone, brain, and bilateral adrenal glands. Patient is s/p palliative radiotherapy to resected area at L5. He is receiving systemic chemotherapy with carboplatin. Patient is followed by Dr. Earlie Server.   Met with patient in infusion. He reports tolerating treatment well, denies nutrition impact symptoms. He is sleeping well, reports 8-9 hours nightly. Patient reports good appetite and eats 3 meals with occasional snacks. Patient reports intentional 20 lb weight loss on the "[employer]" by reducing his portion sizes over the last year.  Yesterday, he had fruit, English muffin with peanut butter for breakfast, tuna salad, chicken salad, pear for lunch, and fettuccini for dinner, slice of cherry pie for dessert. Patient drinks 32-48 oz of water daily, usually drinks 1/2 cup of coffee with breakfast. Patient does not like Boost/Ensure supplements.     Medications: Ferrous sulfate, Prilosec, Compazine   Labs: reviewed   Anthropometrics: Weights have decreased 5 lbs (3%) from 165 lb 12.8 oz on 5/19. This is significant for time frame.   Height: 5'9" Weight: 160 lb 4.8 oz UBW: 182 lb (01/22) BMI: 23.67    NUTRITION DIAGNOSIS: Food and nutrition related knowledge deficit related to cancer and associated treatments as evidenced by no prior need for related nutrition education    INTERVENTION:  Discussed importance of adequate calories and protein to maintain weights, strength, and nutrition Encouraged high calorie high protein snacks in between meals, handout provided Provided sample of [employer] [place] and Reason supplements for patient to try Contact information provided    MONITORING, EVALUATION, GOAL: Patient will tolerate increased calories and protein to minimize weight loss   Next Visit: Tuesday June 28 in infusion

## 2021-03-05 NOTE — Patient Instructions (Signed)
[facility]  Discharge Instructions: Thank you for choosing [facility] to provide your oncology and hematology care.   If you have a lab appointment with the [facility], please go directly to the [facility] and check in at the registration area.   Wear comfortable clothing and clothing appropriate for easy access to any Portacath or PICC line.   We strive to give you quality time with your provider. You may need to reschedule your appointment if you arrive late (15 or more minutes).  Arriving late affects you and other patients whose appointments are after yours.  Also, if you miss three or more appointments without notifying the office, you may be dismissed from the clinic at the provider's discretion.      For prescription refill requests, have your pharmacy contact our office and allow 72 hours for refills to be completed.    Today you received the following chemotherapy and/or immunotherapy agents etoposide   To help prevent nausea and vomiting after your treatment, we encourage you to take your nausea medication as directed.  BELOW ARE SYMPTOMS THAT SHOULD BE REPORTED IMMEDIATELY: . *FEVER GREATER THAN 100.4 F (38 C) OR HIGHER . *CHILLS OR SWEATING . *NAUSEA AND VOMITING THAT IS NOT CONTROLLED WITH YOUR NAUSEA MEDICATION . *UNUSUAL SHORTNESS OF BREATH . *UNUSUAL BRUISING OR BLEEDING . *URINARY PROBLEMS (pain or burning when urinating, or frequent urination) . *BOWEL PROBLEMS (unusual diarrhea, constipation, pain near the anus) . TENDERNESS IN MOUTH AND THROAT WITH OR WITHOUT PRESENCE OF ULCERS (sore throat, sores in mouth, or a toothache) . UNUSUAL RASH, SWELLING OR PAIN  . UNUSUAL VAGINAL DISCHARGE OR ITCHING   Items with * indicate a potential emergency and should be followed up as soon as possible or go to the Emergency Department if any problems should occur.  Please show the CHEMOTHERAPY ALERT CARD or IMMUNOTHERAPY ALERT CARD  at check-in to the Emergency Department and triage nurse.  Should you have questions after your visit or need to cancel or reschedule your appointment, please contact [facility]  Dept: [phone]  and follow the prompts.  Office hours are 8:00 a.m. to 4:30 p.m. Monday - Friday. Please note that voicemails left after 4:00 p.m. may not be returned until the following business day.  We are closed weekends and major holidays. You have access to a nurse at all times for urgent questions. Please call the main number to the clinic Dept: [phone] and follow the prompts.   For any non-urgent questions, you may also contact your provider using MyChart. We now offer e-Visits for anyone 24 and older to request care online for non-urgent symptoms. For details visit [email or web address].   Also download the MyChart app! Go to the app store, search "MyChart", open the app, select [facility], and log in with your MyChart username and password.  Due to Covid, a mask is required upon entering the hospital/clinic. If you do not have a mask, one will be given to you upon arrival. For doctor visits, patients may have 1 support person aged 67 or older with them. For treatment visits, patients cannot have anyone with them due to current Covid guidelines and our immunocompromised population.

## 2021-03-06 ENCOUNTER — Inpatient Hospital Stay: Payer: PPO

## 2021-03-06 ENCOUNTER — Telehealth: Payer: Self-pay | Admitting: Internal Medicine

## 2021-03-06 ENCOUNTER — Other Ambulatory Visit: Payer: Self-pay

## 2021-03-06 VITALS — BP 107/58 | HR 67 | Temp 97.7°F | Resp 17

## 2021-03-06 DIAGNOSIS — Z5111 Encounter for antineoplastic chemotherapy: Secondary | ICD-10-CM | POA: Diagnosis not present

## 2021-03-06 DIAGNOSIS — C3491 Malignant neoplasm of unspecified part of right bronchus or lung: Secondary | ICD-10-CM

## 2021-03-06 MED ORDER — SODIUM CHLORIDE 0.9 % IV SOLN
100.0000 mg/m2 | Freq: Once | INTRAVENOUS | Status: AC
  Administered 2021-03-06: 200 mg via INTRAVENOUS
  Filled 2021-03-06: qty 10

## 2021-03-06 MED ORDER — SODIUM CHLORIDE 0.9 % IV SOLN
Freq: Once | INTRAVENOUS | Status: AC
  Filled 2021-03-06: qty 250

## 2021-03-06 MED ORDER — TRILACICLIB DIHYDROCHLORIDE INJECTION 300 MG
240.0000 mg/m2 | Freq: Once | INTRAVENOUS | Status: AC
  Administered 2021-03-06: 465 mg via INTRAVENOUS
  Filled 2021-03-06: qty 31

## 2021-03-06 MED ORDER — HEPARIN SOD (PORK) LOCK FLUSH 100 UNIT/ML IV SOLN
500.0000 [IU] | Freq: Once | INTRAVENOUS | Status: AC | PRN
  Administered 2021-03-06: 500 [IU]
  Filled 2021-03-06: qty 5

## 2021-03-06 MED ORDER — SODIUM CHLORIDE 0.9 % IV SOLN
10.0000 mg | Freq: Once | INTRAVENOUS | Status: AC
  Administered 2021-03-06: 10 mg via INTRAVENOUS
  Filled 2021-03-06: qty 10

## 2021-03-06 MED ORDER — SODIUM CHLORIDE 0.9% FLUSH
10.0000 mL | INTRAVENOUS | Status: DC | PRN
  Administered 2021-03-06: 10 mL
  Filled 2021-03-06: qty 10

## 2021-03-06 NOTE — Telephone Encounter (Signed)
Scheduled per los. Will get printout in infusion

## 2021-03-06 NOTE — Patient Instructions (Addendum)
[facility] ONCOLOGY  Discharge Instructions: Thank you for choosing [facility] to provide your oncology and hematology care.   If you have a lab appointment with the [facility], please go directly to the [facility] and check in at the registration area.   Wear comfortable clothing and clothing appropriate for easy access to any Portacath or PICC line.   We strive to give you quality time with your provider. You may need to reschedule your appointment if you arrive late (15 or more minutes).  Arriving late affects you and other patients whose appointments are after yours.  Also, if you miss three or more appointments without notifying the office, you may be dismissed from the clinic at the provider's discretion.      For prescription refill requests, have your pharmacy contact our office and allow 72 hours for refills to be completed.    Today you received the following chemotherapy and/or immunotherapy agents: Etoposide (Vepesid VP-16)      To help prevent nausea and vomiting after your treatment, we encourage you to take your nausea medication as directed.  BELOW ARE SYMPTOMS THAT SHOULD BE REPORTED IMMEDIATELY: *FEVER GREATER THAN 100.4 F (38 C) OR HIGHER *CHILLS OR SWEATING *NAUSEA AND VOMITING THAT IS NOT CONTROLLED WITH YOUR NAUSEA MEDICATION *UNUSUAL SHORTNESS OF BREATH *UNUSUAL BRUISING OR BLEEDING *URINARY PROBLEMS (pain or burning when urinating, or frequent urination) *BOWEL PROBLEMS (unusual diarrhea, constipation, pain near the anus) TENDERNESS IN MOUTH AND THROAT WITH OR WITHOUT PRESENCE OF ULCERS (sore throat, sores in mouth, or a toothache) UNUSUAL RASH, SWELLING OR PAIN  UNUSUAL VAGINAL DISCHARGE OR ITCHING   Items with * indicate a potential emergency and should be followed up as soon as possible or go to the Emergency Department if any problems should occur.  Please show the CHEMOTHERAPY ALERT CARD or IMMUNOTHERAPY ALERT CARD  at check-in to the Emergency Department and triage nurse.  Should you have questions after your visit or need to cancel or reschedule your appointment, please contact [facility]  Dept: [phone]  and follow the prompts.  Office hours are 8:00 a.m. to 4:30 p.m. Monday - Friday. Please note that voicemails left after 4:00 p.m. may not be returned until the following business day.  We are closed weekends and major holidays. You have access to a nurse at all times for urgent questions. Please call the main number to the clinic Dept: [phone] and follow the prompts.   For any non-urgent questions, you may also contact your provider using MyChart. We now offer e-Visits for anyone 80 and older to request care online for non-urgent symptoms. For details visit [email or web address].   Also download the MyChart app! Go to the app store, search "MyChart", open the app, select [facility], and log in with your MyChart username and password.  Due to Covid, a mask is required upon entering the hospital/clinic. If you do not have a mask, one will be given to you upon arrival. For doctor visits, patients may have 1 support person aged 32 or older with them. For treatment visits, patients cannot have anyone with them due to current Covid guidelines and our immunocompromised population.

## 2021-03-07 ENCOUNTER — Telehealth: Payer: Self-pay | Admitting: Medical Oncology

## 2021-03-07 NOTE — Telephone Encounter (Signed)
Schedule conflict for CT scan Message sent to radiology to r/s with pt.

## 2021-03-08 ENCOUNTER — Inpatient Hospital Stay: Payer: PPO

## 2021-03-08 ENCOUNTER — Other Ambulatory Visit: Payer: Self-pay

## 2021-03-08 VITALS — BP 106/54 | HR 73 | Temp 97.6°F | Resp 18

## 2021-03-08 DIAGNOSIS — C3491 Malignant neoplasm of unspecified part of right bronchus or lung: Secondary | ICD-10-CM

## 2021-03-08 DIAGNOSIS — Z5111 Encounter for antineoplastic chemotherapy: Secondary | ICD-10-CM | POA: Diagnosis not present

## 2021-03-08 MED ORDER — PEGFILGRASTIM-CBQV 6 MG/0.6ML ~~LOC~~ SOSY
6.0000 mg | PREFILLED_SYRINGE | Freq: Once | SUBCUTANEOUS | Status: AC
  Administered 2021-03-08: 6 mg via SUBCUTANEOUS

## 2021-03-10 ENCOUNTER — Ambulatory Visit: Payer: PPO | Admitting: Radiation Oncology

## 2021-03-11 ENCOUNTER — Telehealth: Payer: Self-pay | Admitting: Physician Assistant

## 2021-03-11 ENCOUNTER — Other Ambulatory Visit: Payer: PPO

## 2021-03-11 ENCOUNTER — Telehealth: Payer: Self-pay

## 2021-03-11 ENCOUNTER — Inpatient Hospital Stay: Payer: PPO

## 2021-03-11 ENCOUNTER — Ambulatory Visit: Admission: RE | Admit: 2021-03-11 | Payer: PPO | Source: Ambulatory Visit | Admitting: Radiation Oncology

## 2021-03-11 ENCOUNTER — Other Ambulatory Visit: Payer: Self-pay

## 2021-03-11 DIAGNOSIS — C7931 Secondary malignant neoplasm of brain: Secondary | ICD-10-CM | POA: Insufficient documentation

## 2021-03-11 DIAGNOSIS — Z95828 Presence of other vascular implants and grafts: Secondary | ICD-10-CM

## 2021-03-11 DIAGNOSIS — C3491 Malignant neoplasm of unspecified part of right bronchus or lung: Secondary | ICD-10-CM

## 2021-03-11 DIAGNOSIS — Z5111 Encounter for antineoplastic chemotherapy: Secondary | ICD-10-CM | POA: Diagnosis not present

## 2021-03-11 LAB — CMP (CANCER CENTER ONLY)
ALT: 7 U/L (ref 0–44)
AST: 11 U/L — ABNORMAL LOW (ref 15–41)
Albumin: 3.3 g/dL — ABNORMAL LOW (ref 3.5–5.0)
Alkaline Phosphatase: 124 U/L (ref 38–126)
Anion gap: 7 (ref 5–15)
BUN: 33 mg/dL — ABNORMAL HIGH (ref 8–23)
CO2: 21 mmol/L — ABNORMAL LOW (ref 22–32)
Calcium: 8.8 mg/dL — ABNORMAL LOW (ref 8.9–10.3)
Chloride: 109 mmol/L (ref 98–111)
Creatinine: 0.99 mg/dL (ref 0.61–1.24)
GFR, Estimated: >60 mL/min (ref >60)
Glucose, Bld: 85 mg/dL (ref 70–99)
Potassium: 5.8 mmol/L — ABNORMAL HIGH (ref 3.5–5.1)
Sodium: 137 mmol/L (ref 135–145)
Total Bilirubin: 0.4 mg/dL (ref 0.3–1.2)
Total Protein: 5.8 g/dL — ABNORMAL LOW (ref 6.5–8.1)

## 2021-03-11 LAB — CBC WITH DIFFERENTIAL (CANCER CENTER ONLY)
Abs Immature Granulocytes: 0 10*3/uL (ref 0.00–0.07)
Basophils Absolute: 0 10*3/uL (ref 0.0–0.1)
Basophils Relative: 0 %
Eosinophils Absolute: 0 10*3/uL (ref 0.0–0.5)
Eosinophils Relative: 0 %
HCT: 31 % — ABNORMAL LOW (ref 39.0–52.0)
Hemoglobin: 9.8 g/dL — ABNORMAL LOW (ref 13.0–17.0)
Lymphocytes Relative: 1 %
Lymphs Abs: 0.2 10*3/uL — ABNORMAL LOW (ref 0.7–4.0)
MCH: 30.9 pg (ref 26.0–34.0)
MCHC: 31.6 g/dL (ref 30.0–36.0)
MCV: 97.8 fL (ref 80.0–100.0)
Monocytes Absolute: 0.2 10*3/uL (ref 0.1–1.0)
Monocytes Relative: 1 %
Neutro Abs: 20 10*3/uL — ABNORMAL HIGH (ref 1.7–7.7)
Neutrophils Relative %: 98 %
Platelet Count: 26 10*3/uL — ABNORMAL LOW (ref 150–400)
RBC: 3.17 MIL/uL — ABNORMAL LOW (ref 4.22–5.81)
RDW: 17.4 % — ABNORMAL HIGH (ref 11.5–15.5)
WBC Count: 20.4 10*3/uL — ABNORMAL HIGH (ref 4.0–10.5)
nRBC: 0 % (ref 0.0–0.2)

## 2021-03-11 LAB — TSH: TSH: 0.824 u[IU]/mL (ref 0.320–4.118)

## 2021-03-11 MED ORDER — HEPARIN SOD (PORK) LOCK FLUSH 100 UNIT/ML IV SOLN
500.0000 [IU] | Freq: Once | INTRAVENOUS | Status: AC
Start: 2021-03-11 — End: 2021-03-11
  Administered 2021-03-11: 500 [IU]
  Filled 2021-03-11: qty 5

## 2021-03-11 MED ORDER — SODIUM CHLORIDE 0.9% FLUSH
10.0000 mL | Freq: Once | INTRAVENOUS | Status: AC
  Administered 2021-03-11: 10 mL
  Filled 2021-03-11: qty 10

## 2021-03-11 NOTE — Telephone Encounter (Signed)
While pt was here for his port flush, he requests to cancel his appt with Dr. Lisbeth Renshaw. He states it is his understanding that he no longer needs this appt.  I advised the pt, I will message his team and have them reach out to him to discuss.

## 2021-03-11 NOTE — Telephone Encounter (Signed)
I called the patient after receiving a message that the patient did not understand why he needed to see radiation oncology. I tried to call to clarify if he had any questions. He was very persistent that he wanted to cancel his appointment today. I will talk to Dr. Julien Nordmann and we will discuss this further at his next follow up appointment.

## 2021-03-17 ENCOUNTER — Inpatient Hospital Stay: Payer: PPO

## 2021-03-17 ENCOUNTER — Other Ambulatory Visit: Payer: PPO

## 2021-03-17 ENCOUNTER — Other Ambulatory Visit: Payer: Self-pay

## 2021-03-17 DIAGNOSIS — Z95828 Presence of other vascular implants and grafts: Secondary | ICD-10-CM

## 2021-03-17 DIAGNOSIS — Z5111 Encounter for antineoplastic chemotherapy: Secondary | ICD-10-CM | POA: Diagnosis not present

## 2021-03-17 DIAGNOSIS — C3491 Malignant neoplasm of unspecified part of right bronchus or lung: Secondary | ICD-10-CM

## 2021-03-17 LAB — CMP (CANCER CENTER ONLY)
ALT: 13 U/L (ref 0–44)
AST: 11 U/L — ABNORMAL LOW (ref 15–41)
Albumin: 3.8 g/dL (ref 3.5–5.0)
Alkaline Phosphatase: 103 U/L (ref 38–126)
Anion gap: 4 — ABNORMAL LOW (ref 5–15)
BUN: 28 mg/dL — ABNORMAL HIGH (ref 8–23)
CO2: 23 mmol/L (ref 22–32)
Calcium: 8.6 mg/dL — ABNORMAL LOW (ref 8.9–10.3)
Chloride: 114 mmol/L — ABNORMAL HIGH (ref 98–111)
Creatinine: 1.32 mg/dL — ABNORMAL HIGH (ref 0.61–1.24)
GFR, Estimated: 56 mL/min — ABNORMAL LOW (ref >60)
Glucose, Bld: 74 mg/dL (ref 70–99)
Potassium: 4.9 mmol/L (ref 3.5–5.1)
Sodium: 141 mmol/L (ref 135–145)
Total Bilirubin: 0.2 mg/dL — ABNORMAL LOW (ref 0.3–1.2)
Total Protein: 6.1 g/dL — ABNORMAL LOW (ref 6.5–8.1)

## 2021-03-17 LAB — CBC WITH DIFFERENTIAL (CANCER CENTER ONLY)
Abs Immature Granulocytes: 0.14 10*3/uL — ABNORMAL HIGH (ref 0.00–0.07)
Basophils Absolute: 0.1 10*3/uL (ref 0.0–0.1)
Basophils Relative: 1 %
Eosinophils Absolute: 0 10*3/uL (ref 0.0–0.5)
Eosinophils Relative: 0 %
HCT: 29.3 % — ABNORMAL LOW (ref 39.0–52.0)
Hemoglobin: 9.4 g/dL — ABNORMAL LOW (ref 13.0–17.0)
Immature Granulocytes: 2 %
Lymphocytes Relative: 3 %
Lymphs Abs: 0.3 10*3/uL — ABNORMAL LOW (ref 0.7–4.0)
MCH: 31.8 pg (ref 26.0–34.0)
MCHC: 32.1 g/dL (ref 30.0–36.0)
MCV: 99 fL (ref 80.0–100.0)
Monocytes Absolute: 1 10*3/uL (ref 0.1–1.0)
Monocytes Relative: 11 %
Neutro Abs: 7.7 10*3/uL (ref 1.7–7.7)
Neutrophils Relative %: 83 %
Platelet Count: 22 10*3/uL — ABNORMAL LOW (ref 150–400)
RBC: 2.96 MIL/uL — ABNORMAL LOW (ref 4.22–5.81)
RDW: 18.8 % — ABNORMAL HIGH (ref 11.5–15.5)
WBC Count: 9.2 10*3/uL (ref 4.0–10.5)
nRBC: 0.6 % — ABNORMAL HIGH (ref 0.0–0.2)

## 2021-03-17 MED ORDER — HEPARIN SOD (PORK) LOCK FLUSH 100 UNIT/ML IV SOLN
500.0000 [IU] | Freq: Once | INTRAVENOUS | Status: AC
  Administered 2021-03-17: 500 [IU]
  Filled 2021-03-17: qty 5

## 2021-03-17 MED ORDER — SODIUM CHLORIDE 0.9% FLUSH
10.0000 mL | Freq: Once | INTRAVENOUS | Status: AC
  Administered 2021-03-17: 10 mL
  Filled 2021-03-17: qty 10

## 2021-03-24 ENCOUNTER — Other Ambulatory Visit: Payer: Self-pay | Admitting: Medical Oncology

## 2021-03-24 DIAGNOSIS — C3491 Malignant neoplasm of unspecified part of right bronchus or lung: Secondary | ICD-10-CM

## 2021-03-25 ENCOUNTER — Other Ambulatory Visit: Payer: Self-pay | Admitting: Internal Medicine

## 2021-03-25 ENCOUNTER — Encounter (HOSPITAL_COMMUNITY): Payer: Self-pay

## 2021-03-25 ENCOUNTER — Inpatient Hospital Stay: Payer: PPO

## 2021-03-25 ENCOUNTER — Ambulatory Visit (HOSPITAL_COMMUNITY)
Admission: RE | Admit: 2021-03-25 | Discharge: 2021-03-25 | Disposition: A | Discharge status: Home / Self Care | Payer: PPO | Source: Ambulatory Visit | Attending: Internal Medicine | Admitting: Internal Medicine

## 2021-03-25 ENCOUNTER — Other Ambulatory Visit: Payer: PPO

## 2021-03-25 ENCOUNTER — Other Ambulatory Visit: Payer: Self-pay

## 2021-03-25 ENCOUNTER — Inpatient Hospital Stay: Payer: PPO | Admitting: Internal Medicine

## 2021-03-25 ENCOUNTER — Inpatient Hospital Stay: Payer: PPO | Admitting: Dietician

## 2021-03-25 VITALS — BP 100/67 | HR 77 | Temp 98.0°F | Resp 17 | Ht 69.0 in | Wt 160.5 lb

## 2021-03-25 DIAGNOSIS — N200 Calculus of kidney: Secondary | ICD-10-CM | POA: Diagnosis not present

## 2021-03-25 DIAGNOSIS — C3491 Malignant neoplasm of unspecified part of right bronchus or lung: Secondary | ICD-10-CM | POA: Diagnosis not present

## 2021-03-25 DIAGNOSIS — Z5111 Encounter for antineoplastic chemotherapy: Secondary | ICD-10-CM | POA: Diagnosis not present

## 2021-03-25 DIAGNOSIS — I700 Atherosclerosis of aorta: Secondary | ICD-10-CM | POA: Diagnosis not present

## 2021-03-25 DIAGNOSIS — C7971 Secondary malignant neoplasm of right adrenal gland: Secondary | ICD-10-CM

## 2021-03-25 DIAGNOSIS — C3490 Malignant neoplasm of unspecified part of unspecified bronchus or lung: Secondary | ICD-10-CM | POA: Insufficient documentation

## 2021-03-25 DIAGNOSIS — C7951 Secondary malignant neoplasm of bone: Secondary | ICD-10-CM | POA: Diagnosis not present

## 2021-03-25 DIAGNOSIS — J439 Emphysema, unspecified: Secondary | ICD-10-CM | POA: Diagnosis not present

## 2021-03-25 DIAGNOSIS — C7931 Secondary malignant neoplasm of brain: Secondary | ICD-10-CM

## 2021-03-25 DIAGNOSIS — F17210 Nicotine dependence, cigarettes, uncomplicated: Secondary | ICD-10-CM | POA: Diagnosis not present

## 2021-03-25 DIAGNOSIS — Z79899 Other long term (current) drug therapy: Secondary | ICD-10-CM

## 2021-03-25 DIAGNOSIS — Z5112 Encounter for antineoplastic immunotherapy: Secondary | ICD-10-CM

## 2021-03-25 DIAGNOSIS — Z95828 Presence of other vascular implants and grafts: Secondary | ICD-10-CM

## 2021-03-25 LAB — CBC WITH DIFFERENTIAL (CANCER CENTER ONLY)
Abs Immature Granulocytes: 0.07 10*3/uL (ref 0.00–0.07)
Basophils Absolute: 0 10*3/uL (ref 0.0–0.1)
Basophils Relative: 0 %
Eosinophils Absolute: 0.1 10*3/uL (ref 0.0–0.5)
Eosinophils Relative: 1 %
HCT: 32 % — ABNORMAL LOW (ref 39.0–52.0)
Hemoglobin: 10.2 g/dL — ABNORMAL LOW (ref 13.0–17.0)
Immature Granulocytes: 1 %
Lymphocytes Relative: 2 %
Lymphs Abs: 0.2 10*3/uL — ABNORMAL LOW (ref 0.7–4.0)
MCH: 32.5 pg (ref 26.0–34.0)
MCHC: 31.9 g/dL (ref 30.0–36.0)
MCV: 101.9 fL — ABNORMAL HIGH (ref 80.0–100.0)
Monocytes Absolute: 1.1 10*3/uL — ABNORMAL HIGH (ref 0.1–1.0)
Monocytes Relative: 11 %
Neutro Abs: 9.2 10*3/uL — ABNORMAL HIGH (ref 1.7–7.7)
Neutrophils Relative %: 85 %
Platelet Count: 90 10*3/uL — ABNORMAL LOW (ref 150–400)
RBC: 3.14 MIL/uL — ABNORMAL LOW (ref 4.22–5.81)
RDW: 20.4 % — ABNORMAL HIGH (ref 11.5–15.5)
WBC Count: 10.6 10*3/uL — ABNORMAL HIGH (ref 4.0–10.5)
nRBC: 0 % (ref 0.0–0.2)

## 2021-03-25 LAB — CMP (CANCER CENTER ONLY)
ALT: <6 U/L (ref 0–44)
AST: 10 U/L — ABNORMAL LOW (ref 15–41)
Albumin: 3.1 g/dL — ABNORMAL LOW (ref 3.5–5.0)
Alkaline Phosphatase: 115 U/L (ref 38–126)
Anion gap: 10 (ref 5–15)
BUN: 16 mg/dL (ref 8–23)
CO2: 20 mmol/L — ABNORMAL LOW (ref 22–32)
Calcium: 8.5 mg/dL — ABNORMAL LOW (ref 8.9–10.3)
Chloride: 110 mmol/L (ref 98–111)
Creatinine: 0.93 mg/dL (ref 0.61–1.24)
GFR, Estimated: >60 mL/min (ref >60)
Glucose, Bld: 83 mg/dL (ref 70–99)
Potassium: 5.1 mmol/L (ref 3.5–5.1)
Sodium: 140 mmol/L (ref 135–145)
Total Bilirubin: 0.3 mg/dL (ref 0.3–1.2)
Total Protein: 6 g/dL — ABNORMAL LOW (ref 6.5–8.1)

## 2021-03-25 LAB — TSH: TSH: 0.737 u[IU]/mL (ref 0.320–4.118)

## 2021-03-25 IMAGING — CT CT ABD-PELV W/ CM
2 of 5 series · 12 of 36 positions shown, 15 images · IV contrast (omnipaque)
Comparison: Most recent CT chest, abdomen and pelvis [DATE].
[DATE] PET-CT.

CLINICAL DATA: Primary Cancer Type: Lung
TECHNIQUE: Multidetector CT imaging of the chest, abdomen and pelvis was
performed following the standard protocol during bolus
administration of intravenous contrast.

CONTRAST:  100mL OMNIPAQUE IOHEXOL 300 MG/ML  SOLN

[Series 2: cap with · axial · 0.79mm/px · z∈[-620,-90]mm · 9 of 134 slices shown, 12 images]
[im 14/134  mediastinal]
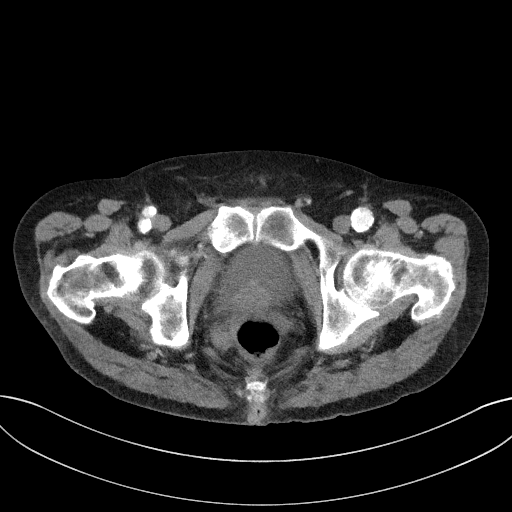
[im 14/134  lung]
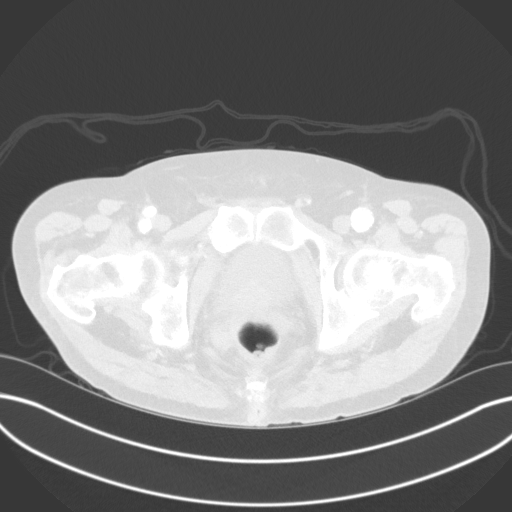
[im 27/134  lung]
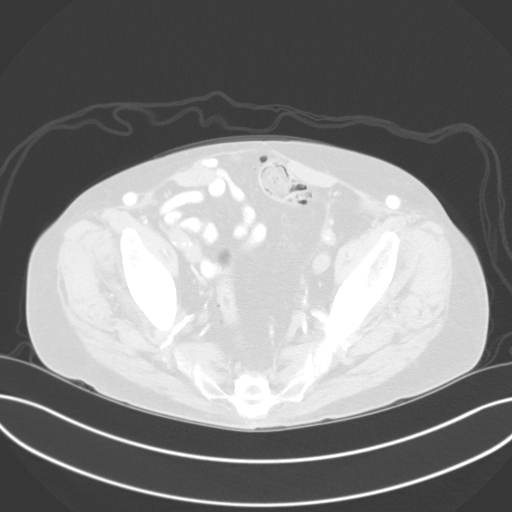
[im 40/134  lung]
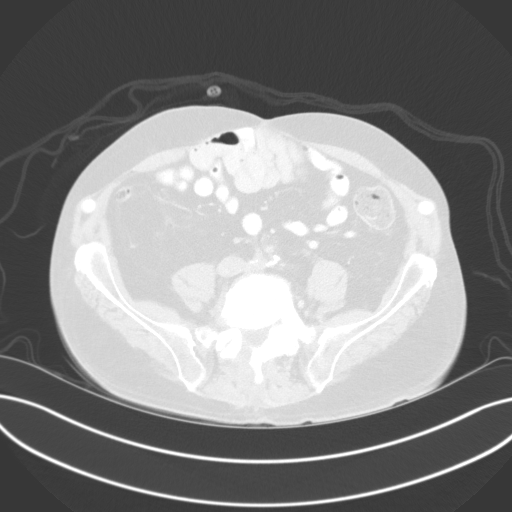
[im 54/134  lung]
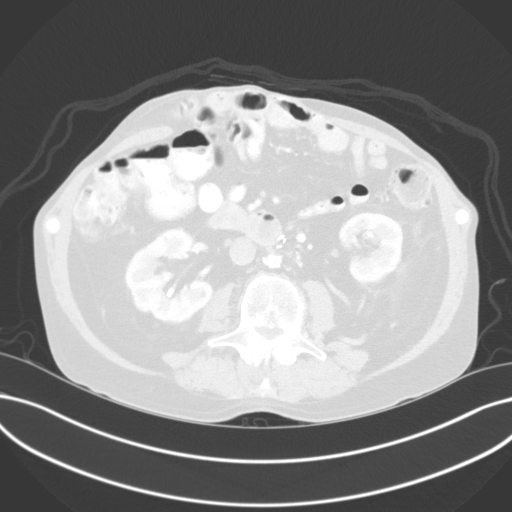
[im 67/134  mediastinal]
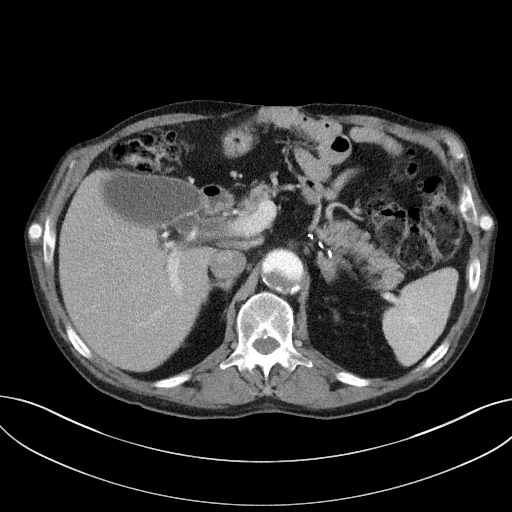
[im 67/134  lung]
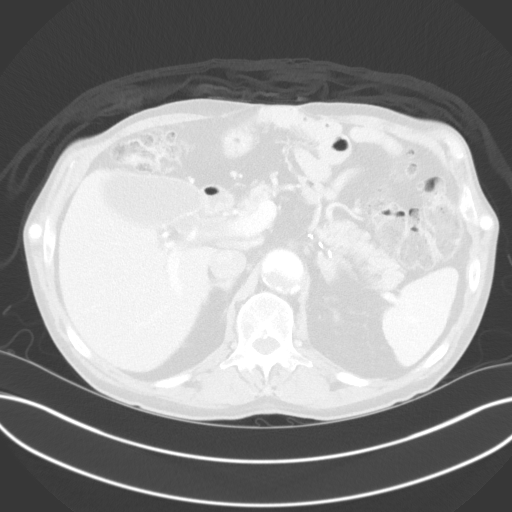
[im 80/134  lung]
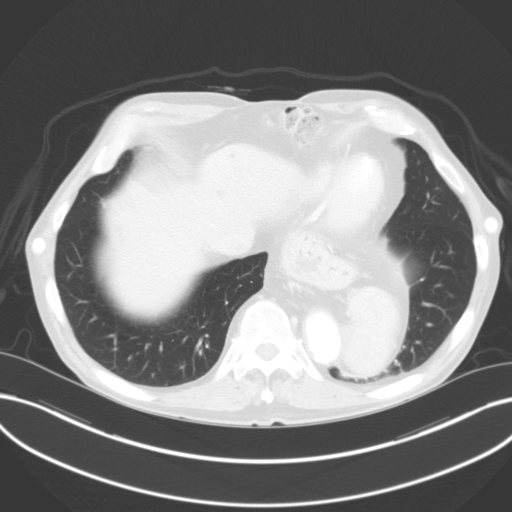
[im 94/134  lung]
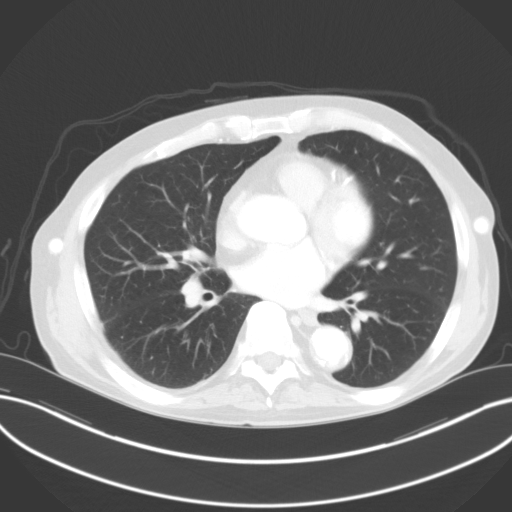
[im 107/134  lung]
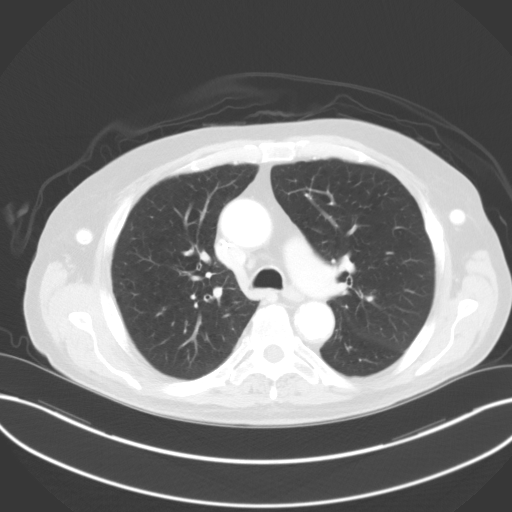
[im 120/134  mediastinal]
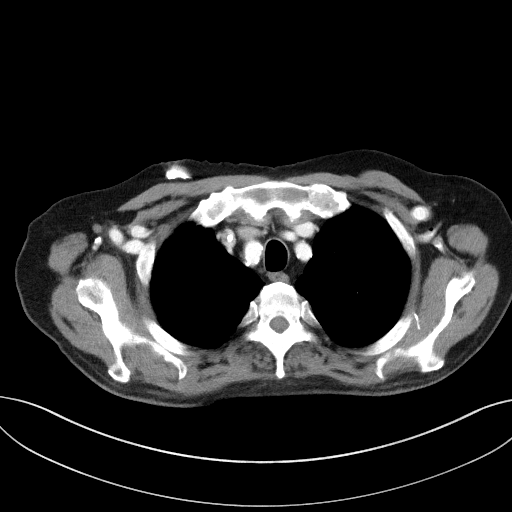
[im 120/134  lung]
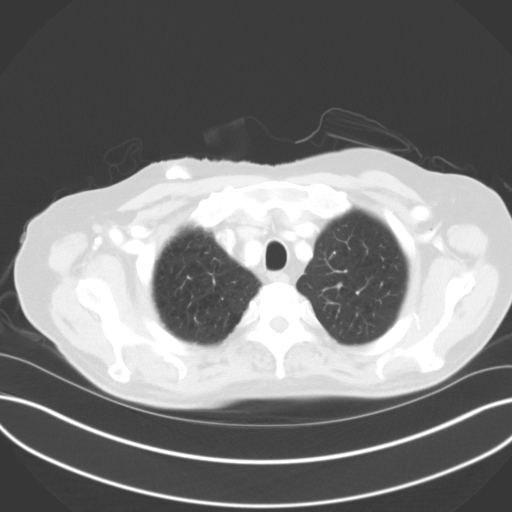

[Series 4: coronals · coronal · 0.87mm/px · 3 of 158 slices shown]
[im 32/158  lung]
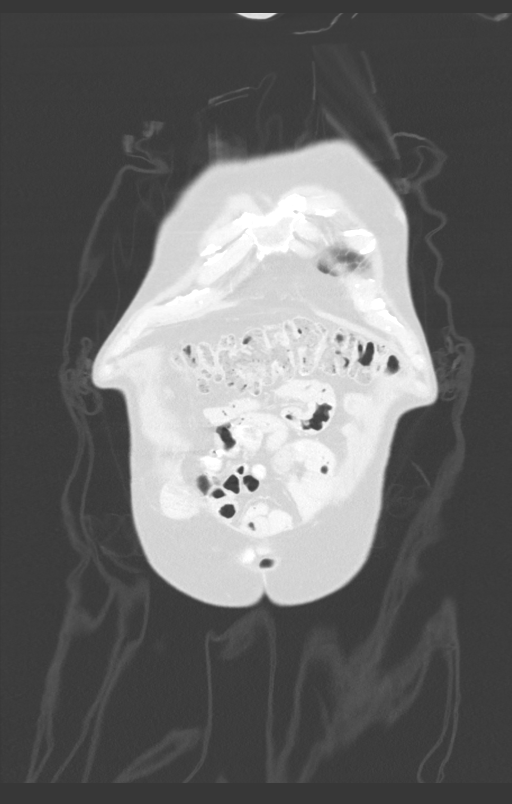
[im 63/158  lung]
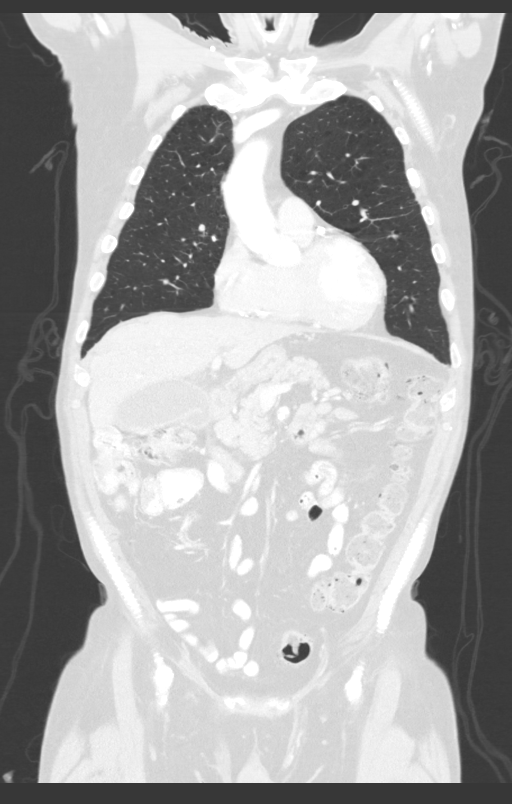
[im 95/158  lung]
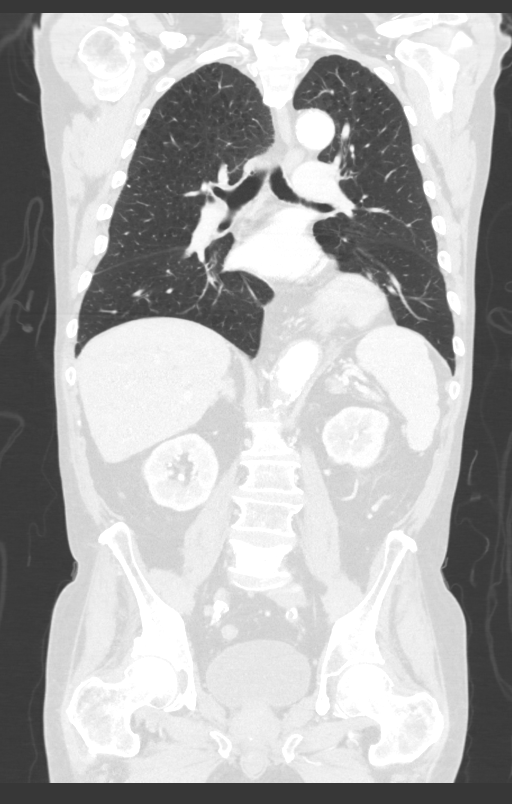

[12 of 36 positions shown; findings below may reference images not displayed]

Imaging Indication: Assess response to therapy

Interval therapy since last imaging? Yes

Initial Cancer Diagnosis

Date: [DATE]; Established by: Biopsy-proven

Detailed Pathology: Extensive stage small cell lung cancer.

Primary Tumor location: Right upper lobe. Metastatic disease to the
brain, bones and bilateral adrenal glands.

Surgeries: L5 laminectomy, resection of epidural tumor [DATE].
AAA repair and axillary-femoral bypass graft [D8]. Coronary stent.

Chemotherapy: Yes; Ongoing? Yes; Most recent administration:
[DATE]

Immunotherapy?  Yes; Type: Imfinzi; Ongoing? Yes

Radiation therapy? Yes; Date Range: [DATE] - [DATE]; Target:
Right pelvis, ribs, chest

EXAM:
CT CHEST, ABDOMEN, AND PELVIS WITH CONTRAST
FINDINGS: CT CHEST FINDINGS

Cardiovascular: The heart size appears upper limits of normal.
Aortic atherosclerosis and multi vessel coronary artery
calcifications. Status post bilateral axillary tip femoral bypass
grafts noted. No pericardial effusion.

Mediastinum/Nodes: Normal appearance of the thyroid gland. The
trachea appears patent and is midline. Large hiatal hernia
identified. No enlarged mediastinal or hilar lymph nodes.

Lungs/Pleura: Moderate centrilobular emphysema. No pleural effusion,
airspace consolidation, or atelectasis. Calcified granulomas
identified within the left upper lobe and right upper lobe. No
suspicious lung nodules.

Musculoskeletal: Slightly expansile lucent rib lesion involving the
right posterior rib with adjacent fracture appears unchanged from
previous exam, image 37/2. Permeative lesion involving the lateral
aspect of the left fourth rib appears slightly more sclerotic in the
interval suggesting healing.

CT ABDOMEN PELVIS FINDINGS

Hepatobiliary: There is atrophy of the medial segment of left
hepatic lobe. Scattered small liver cysts are again noted. Within
the right hepatic lobe there is a new low-attenuation lesion
measuring 1.1 cm, image 62/2. Within the inferior right hepatic lobe
there is a new low-attenuation structure measuring 7 mm, image 71/2.
Remaining low-attenuation liver lesions near the dome appear
unchanged from [DATE] and are favored to represent small cysts.
There is mild diffuse gallbladder wall thickening, similar to
previous exam. No gallstones identified. Increased caliber of the
common bile duct measures 1.2 cm, unchanged. No choledocholithiasis
or obstructing mass noted.

Pancreas: Unremarkable. No pancreatic ductal dilatation or
surrounding inflammatory changes.

Spleen: Normal in size without focal abnormality.

Adrenals/Urinary Tract: Normal appearance of the right adrenal
gland. The left adrenal gland metastasis measures 2.1 x 1.1 cm,
image 68/2. Formally this measured 2.2 x 1.4 cm. Unchanged
appearance of exophytic cyst arising off the lateral cortex of left
kidney measuring 3.1 cm. Additional, smaller bilateral kidney cyst
appears similar. Bilateral kidney stones, unchanged. No
hydronephrosis. Urinary bladder appears unremarkable.

Stomach/Bowel: Large hiatal hernia. There is also a large ventral
abdominal wall hernia which contains nonobstructed loops of small
bowel. The appendix is visualized and appears normal. No bowel wall
thickening, inflammation, or distension. Distal colonic
diverticulosis identified without acute inflammation.

Vascular/Lymphatic: Chronic occlusion of the abdominal aorta below
the level of the renal arteries. No signs of abdominopelvic
adenopathy.

Reproductive: Prostate is unremarkable.

Other: Ventral abdominal wall hernia which contains nonobstructed
loops of small bowel. No ascites or focal fluid collections
identified.

Musculoskeletal: Sclerotic lesion in the anterior column of right
acetabulum with age-indeterminate pathologic fracture is unchanged
from previous exam. Sclerosis and superior endplate fracture
involving the L5 vertebral body is unchanged.
IMPRESSION: 1. No signs of residual or recurrent tumor within the chest.
2. Stable appearance of treated metastasis to the left adrenal
gland.
3. There are 2 new low-attenuation lesions within the right hepatic
lobe. Suspicious for metastatic disease. More definitive
characterization could be obtained with contrast enhanced MRI of the
liver.
4. Stable appearance of osseous metastasis
5. Emphysema and aortic atherosclerosis.
6. Large hiatal hernia.
7. Bilateral nephrolithiasis.
8. Chronic occlusion of the abdominal aorta below the level of the
renal arteries. Status post bilateral axillary to femoral bypass
grafting.

Aortic Atherosclerosis ([D8]-[D8]) and Emphysema ([D8]-[D8]).

## 2021-03-25 IMAGING — CT CT CHEST W/ CM
2 of 5 series · 12 of 36 positions shown, 15 images · IV contrast (OMNIPAQUE)
Comparison: Most recent CT chest, abdomen and pelvis [DATE].
[DATE] PET-CT.

CLINICAL DATA: Primary Cancer Type: Lung
TECHNIQUE: Multidetector CT imaging of the chest, abdomen and pelvis was
performed following the standard protocol during bolus
administration of intravenous contrast.

CONTRAST:  100mL OMNIPAQUE IOHEXOL 300 MG/ML  SOLN

[Series 2: cap with · axial · 0.79mm/px · z∈[-620,-90]mm · 9 of 134 slices shown, 12 images]
[im 14/134  mediastinal]
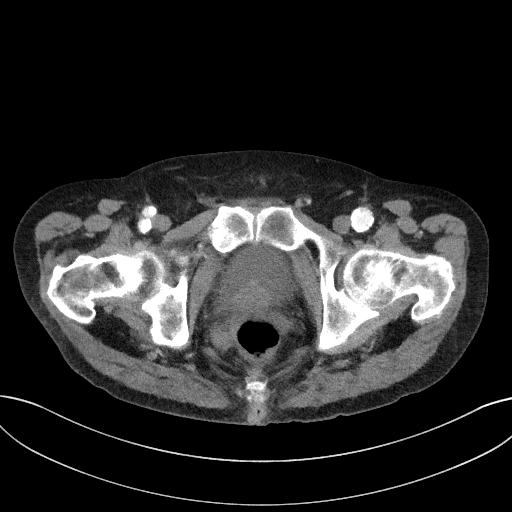
[im 14/134  lung]
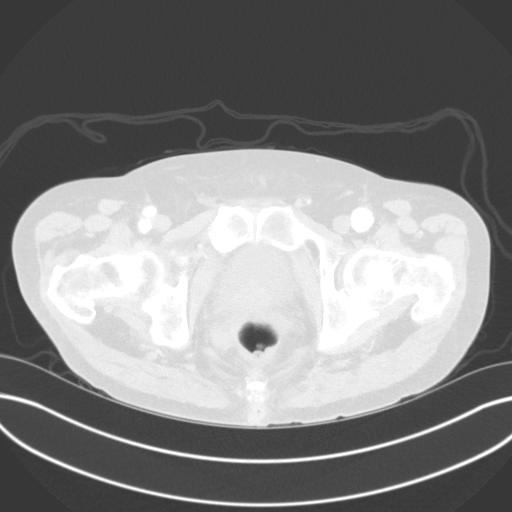
[im 27/134  lung]
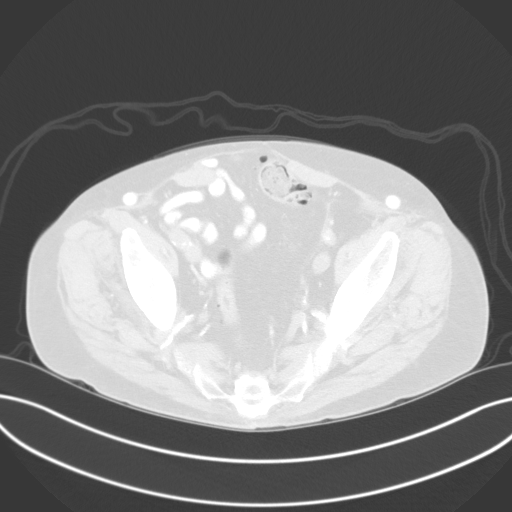
[im 40/134  lung]
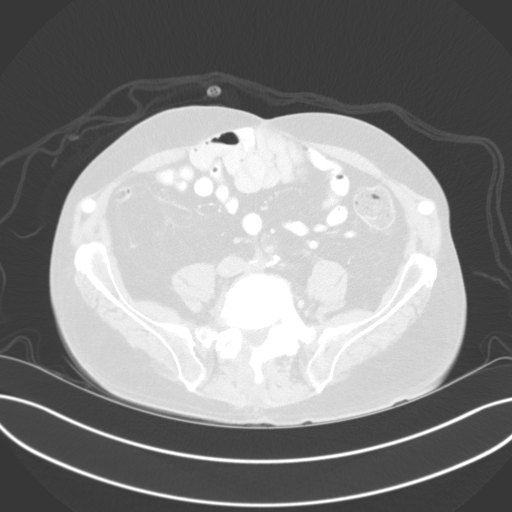
[im 54/134  lung]
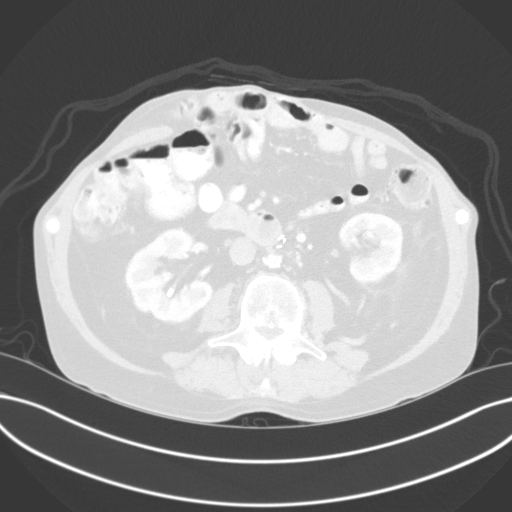
[im 67/134  mediastinal]
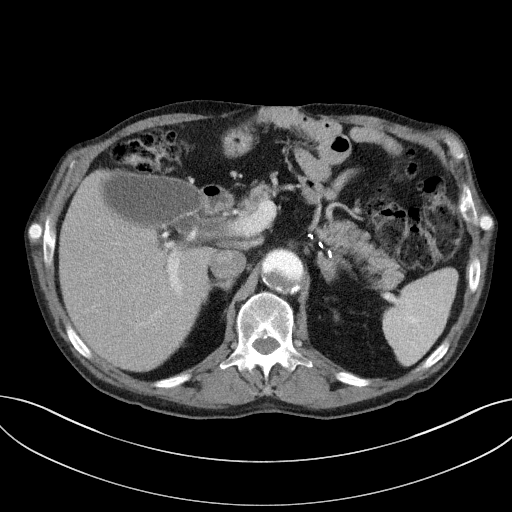
[im 67/134  lung]
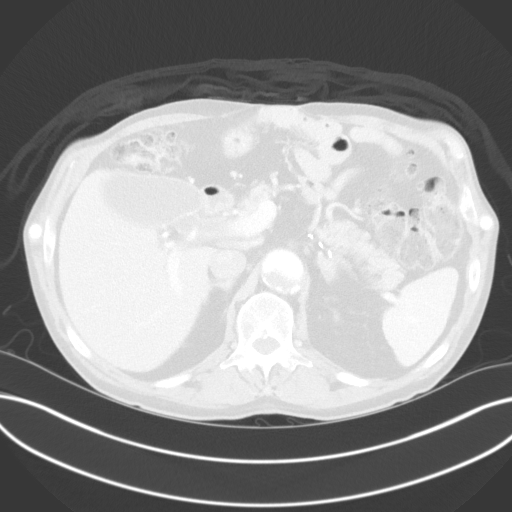
[im 80/134  lung]
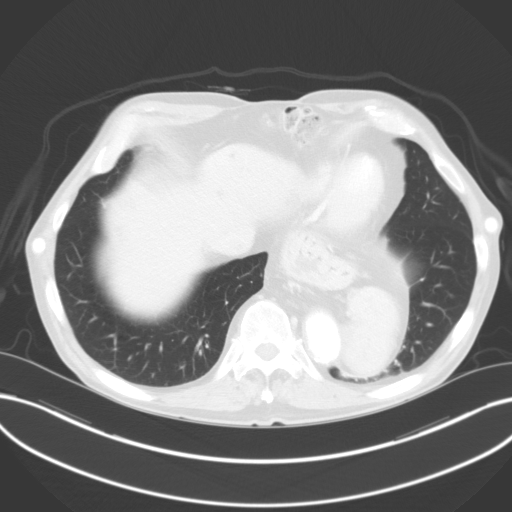
[im 94/134  lung]
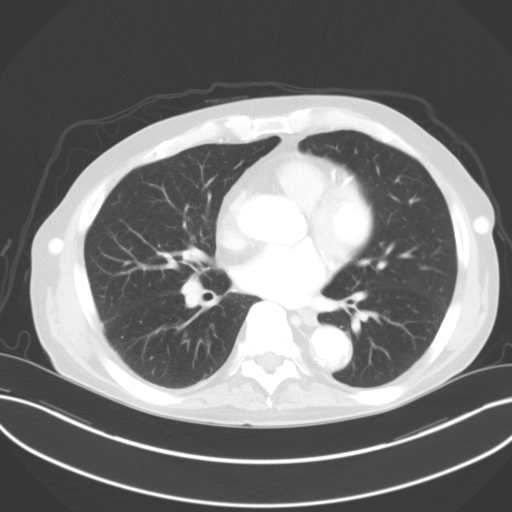
[im 107/134  lung]
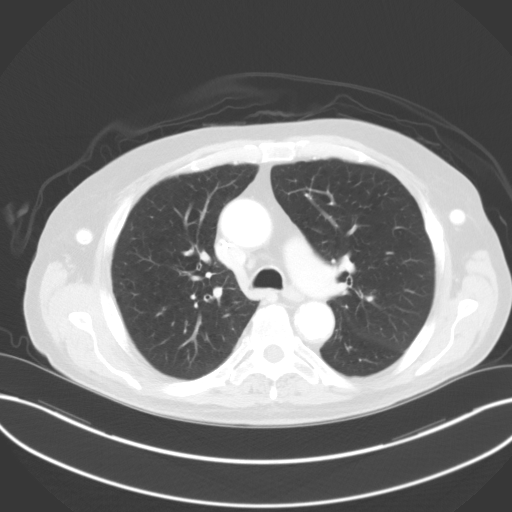
[im 120/134  mediastinal]
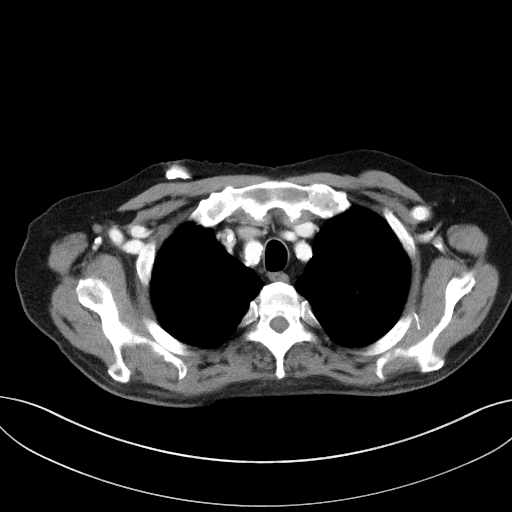
[im 120/134  lung]
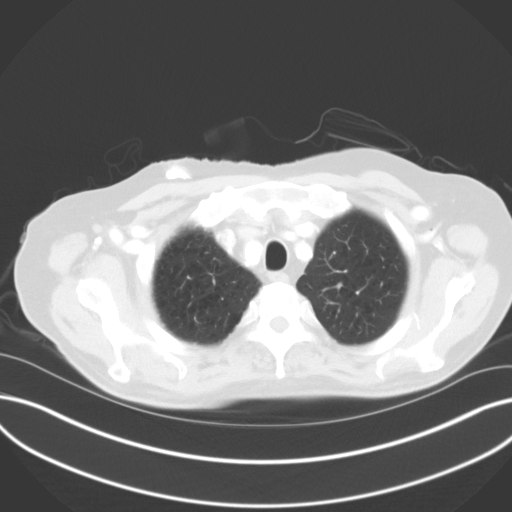

[Series 4: coronals · coronal · 0.87mm/px · 3 of 158 slices shown]
[im 32/158  lung]
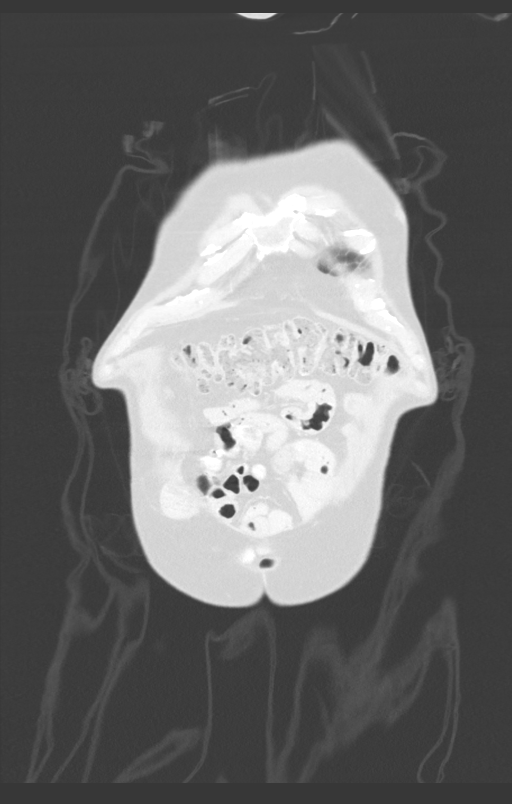
[im 63/158  lung]
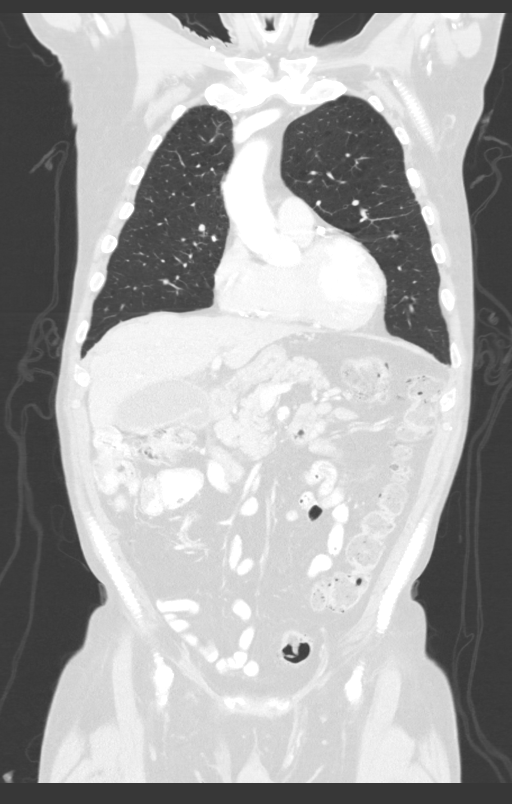
[im 95/158  lung]
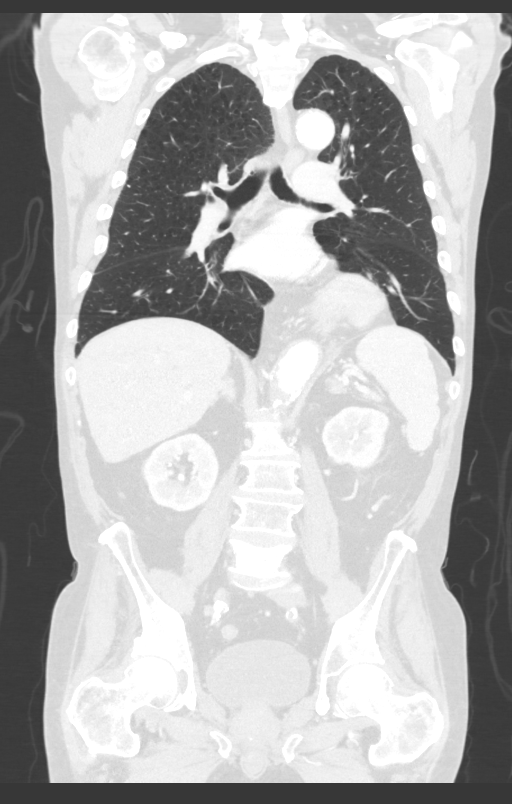

[12 of 36 positions shown; findings below may reference images not displayed]

Imaging Indication: Assess response to therapy

Interval therapy since last imaging? Yes

Initial Cancer Diagnosis

Date: [DATE]; Established by: Biopsy-proven

Detailed Pathology: Extensive stage small cell lung cancer.

Primary Tumor location: Right upper lobe. Metastatic disease to the
brain, bones and bilateral adrenal glands.

Surgeries: L5 laminectomy, resection of epidural tumor [DATE].
AAA repair and axillary-femoral bypass graft [D8]. Coronary stent.

Chemotherapy: Yes; Ongoing? Yes; Most recent administration:
[DATE]

Immunotherapy?  Yes; Type: Imfinzi; Ongoing? Yes

Radiation therapy? Yes; Date Range: [DATE] - [DATE]; Target:
Right pelvis, ribs, chest

EXAM:
CT CHEST, ABDOMEN, AND PELVIS WITH CONTRAST
FINDINGS: CT CHEST FINDINGS

Cardiovascular: The heart size appears upper limits of normal.
Aortic atherosclerosis and multi vessel coronary artery
calcifications. Status post bilateral axillary tip femoral bypass
grafts noted. No pericardial effusion.

Mediastinum/Nodes: Normal appearance of the thyroid gland. The
trachea appears patent and is midline. Large hiatal hernia
identified. No enlarged mediastinal or hilar lymph nodes.

Lungs/Pleura: Moderate centrilobular emphysema. No pleural effusion,
airspace consolidation, or atelectasis. Calcified granulomas
identified within the left upper lobe and right upper lobe. No
suspicious lung nodules.

Musculoskeletal: Slightly expansile lucent rib lesion involving the
right posterior rib with adjacent fracture appears unchanged from
previous exam, image 37/2. Permeative lesion involving the lateral
aspect of the left fourth rib appears slightly more sclerotic in the
interval suggesting healing.

CT ABDOMEN PELVIS FINDINGS

Hepatobiliary: There is atrophy of the medial segment of left
hepatic lobe. Scattered small liver cysts are again noted. Within
the right hepatic lobe there is a new low-attenuation lesion
measuring 1.1 cm, image 62/2. Within the inferior right hepatic lobe
there is a new low-attenuation structure measuring 7 mm, image 71/2.
Remaining low-attenuation liver lesions near the dome appear
unchanged from [DATE] and are favored to represent small cysts.
There is mild diffuse gallbladder wall thickening, similar to
previous exam. No gallstones identified. Increased caliber of the
common bile duct measures 1.2 cm, unchanged. No choledocholithiasis
or obstructing mass noted.

Pancreas: Unremarkable. No pancreatic ductal dilatation or
surrounding inflammatory changes.

Spleen: Normal in size without focal abnormality.

Adrenals/Urinary Tract: Normal appearance of the right adrenal
gland. The left adrenal gland metastasis measures 2.1 x 1.1 cm,
image 68/2. Formally this measured 2.2 x 1.4 cm. Unchanged
appearance of exophytic cyst arising off the lateral cortex of left
kidney measuring 3.1 cm. Additional, smaller bilateral kidney cyst
appears similar. Bilateral kidney stones, unchanged. No
hydronephrosis. Urinary bladder appears unremarkable.

Stomach/Bowel: Large hiatal hernia. There is also a large ventral
abdominal wall hernia which contains nonobstructed loops of small
bowel. The appendix is visualized and appears normal. No bowel wall
thickening, inflammation, or distension. Distal colonic
diverticulosis identified without acute inflammation.

Vascular/Lymphatic: Chronic occlusion of the abdominal aorta below
the level of the renal arteries. No signs of abdominopelvic
adenopathy.

Reproductive: Prostate is unremarkable.

Other: Ventral abdominal wall hernia which contains nonobstructed
loops of small bowel. No ascites or focal fluid collections
identified.

Musculoskeletal: Sclerotic lesion in the anterior column of right
acetabulum with age-indeterminate pathologic fracture is unchanged
from previous exam. Sclerosis and superior endplate fracture
involving the L5 vertebral body is unchanged.
IMPRESSION: 1. No signs of residual or recurrent tumor within the chest.
2. Stable appearance of treated metastasis to the left adrenal
gland.
3. There are 2 new low-attenuation lesions within the right hepatic
lobe. Suspicious for metastatic disease. More definitive
characterization could be obtained with contrast enhanced MRI of the
liver.
4. Stable appearance of osseous metastasis
5. Emphysema and aortic atherosclerosis.
6. Large hiatal hernia.
7. Bilateral nephrolithiasis.
8. Chronic occlusion of the abdominal aorta below the level of the
renal arteries. Status post bilateral axillary to femoral bypass
grafting.

Aortic Atherosclerosis ([D8]-[D8]) and Emphysema ([D8]-[D8]).

## 2021-03-25 MED ORDER — SODIUM CHLORIDE 0.9 % IV SOLN
1500.0000 mg | Freq: Once | INTRAVENOUS | Status: AC
  Administered 2021-03-25: 1500 mg via INTRAVENOUS
  Filled 2021-03-25: qty 30

## 2021-03-25 MED ORDER — HEPARIN SOD (PORK) LOCK FLUSH 100 UNIT/ML IV SOLN
500.0000 [IU] | Freq: Once | INTRAVENOUS | Status: AC | PRN
  Administered 2021-03-25: 500 [IU]
  Filled 2021-03-25: qty 5

## 2021-03-25 MED ORDER — SODIUM CHLORIDE 0.9 % IV SOLN
Freq: Once | INTRAVENOUS | Status: AC
  Filled 2021-03-25: qty 250

## 2021-03-25 MED ORDER — SODIUM CHLORIDE 0.9% FLUSH
10.0000 mL | Freq: Once | INTRAVENOUS | Status: AC
  Administered 2021-03-25: 10 mL
  Filled 2021-03-25: qty 10

## 2021-03-25 MED ORDER — SODIUM CHLORIDE (PF) 0.9 % IJ SOLN
INTRAMUSCULAR | Status: AC
  Filled 2021-03-25: qty 50

## 2021-03-25 MED ORDER — IOHEXOL 300 MG/ML  SOLN
100.0000 mL | Freq: Once | INTRAMUSCULAR | Status: AC | PRN
  Administered 2021-03-25: 100 mL via INTRAVENOUS

## 2021-03-25 MED ORDER — SODIUM CHLORIDE 0.9% FLUSH
10.0000 mL | INTRAVENOUS | Status: DC | PRN
  Administered 2021-03-25: 10 mL
  Filled 2021-03-25: qty 10

## 2021-03-25 NOTE — Patient Instructions (Signed)
[facility] ONCOLOGY  Discharge Instructions: Thank you for choosing [facility] to provide your oncology and hematology care.   If you have a lab appointment with the [facility], please go directly to the [facility] and check in at the registration area.   Wear comfortable clothing and clothing appropriate for easy access to any Portacath or PICC line.   We strive to give you quality time with your provider. You may need to reschedule your appointment if you arrive late (15 or more minutes).  Arriving late affects you and other patients whose appointments are after yours.  Also, if you miss three or more appointments without notifying the office, you may be dismissed from the clinic at the provider's discretion.      For prescription refill requests, have your pharmacy contact our office and allow 72 hours for refills to be completed.    Today you received the following chemotherapy and/or immunotherapy agents Imfinzi      To help prevent nausea and vomiting after your treatment, we encourage you to take your nausea medication as directed.  BELOW ARE SYMPTOMS THAT SHOULD BE REPORTED IMMEDIATELY: *FEVER GREATER THAN 100.4 F (38 C) OR HIGHER *CHILLS OR SWEATING *NAUSEA AND VOMITING THAT IS NOT CONTROLLED WITH YOUR NAUSEA MEDICATION *UNUSUAL SHORTNESS OF BREATH *UNUSUAL BRUISING OR BLEEDING *URINARY PROBLEMS (pain or burning when urinating, or frequent urination) *BOWEL PROBLEMS (unusual diarrhea, constipation, pain near the anus) TENDERNESS IN MOUTH AND THROAT WITH OR WITHOUT PRESENCE OF ULCERS (sore throat, sores in mouth, or a toothache) UNUSUAL RASH, SWELLING OR PAIN  UNUSUAL VAGINAL DISCHARGE OR ITCHING   Items with * indicate a potential emergency and should be followed up as soon as possible or go to the Emergency Department if any problems should occur.  Please show the CHEMOTHERAPY ALERT CARD or IMMUNOTHERAPY ALERT CARD at check-in to the  Emergency Department and triage nurse.  Should you have questions after your visit or need to cancel or reschedule your appointment, please contact [facility]  Dept: [phone]  and follow the prompts.  Office hours are 8:00 a.m. to 4:30 p.m. Monday - Friday. Please note that voicemails left after 4:00 p.m. may not be returned until the following business day.  We are closed weekends and major holidays. You have access to a nurse at all times for urgent questions. Please call the main number to the clinic Dept: [phone] and follow the prompts.   For any non-urgent questions, you may also contact your provider using MyChart. We now offer e-Visits for anyone 60 and older to request care online for non-urgent symptoms. For details visit [email or web address].   Also download the MyChart app! Go to the app store, search "MyChart", open the app, select [facility], and log in with your MyChart username and password.  Due to Covid, a mask is required upon entering the hospital/clinic. If you do not have a mask, one will be given to you upon arrival. For doctor visits, patients may have 1 support person aged 67 or older with them. For treatment visits, patients cannot have anyone with them due to current Covid guidelines and our immunocompromised population.

## 2021-03-25 NOTE — Progress Notes (Signed)
[place] [phone]   [phone]  OFFICE PROGRESS NOTE  [name], MD [address] [place] [address]  DIAGNOSIS: extensive stage (T2 a, N2, M1 C) small cell lung cancer presented with right hilar mass in addition to mediastinal lymphadenopathy as well as metastatic disease to the bone with complete replacement of the L5 status post laminectomy with resection of epidural tumor as well as metastatic disease to the brain, bones and bilateral adrenal glands diagnosed in January 2022.   PRIOR THERAPY: Status post palliative radiotherapy to the resected area at L5 under the care of Dr. Lisbeth Renshaw.  CURRENT THERAPY: Systemic chemotherapy with carboplatin for AUC of 5 from day 1, etoposide 100 mg/M2 on days 1, 2 and 3 with Cosela 240 mg/M2 before chemotherapy in addition to Imfinzi 1500 mg IV every 3 weeks with day one of the chemotherapy.  First dose December 24, 2020.  Status post 4 cycles.  Starting from cycle #5 the patient will be on maintenance treatment with Imfinzi every 4 weeks.  INTERVAL HISTORY: Gregory Hodges 80 y.o. male returns to the clinic today for follow-up visit.  The patient is feeling fine today with no concerning complaints except for fatigue.  He has been tolerating his systemic chemotherapy fairly well.  He denied having any current chest pain, shortness of breath, cough or hemoptysis.  He denied having any nausea, vomiting, diarrhea or constipation.  He denied having any headache or visual changes.  He has no bleeding issues.  He had repeat CT scan of the chest, abdomen pelvis performed earlier today and he is here for evaluation before starting cycle #5 of his treatment.  MEDICAL HISTORY: Past Medical History:  Diagnosis Date   AAA (abdominal aortic aneurysm) ([place])    s/p open repair using aortobifemoral graft 03/03/10 ([facility]), complicated by wound dehisence, enterocutaneous fistula; developed aortic graft  infection s/p explant of graft and placement of bilateral axillofemoral grafts 07/22/10 [facility])   Cancer ([place])    Skin   Crohn's disease ([place])    E coli bacteremia    History of kidney stones    Hyperlipemia    Hypertension    "denies"   Myocardial infarction ([place]) 08/11/2005   s/p Horizon study stent D1   Peripheral vascular disease ([place]) June 2011   SCLC (small cell lung carcinoma) ([place]) dx'd 10/2020   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  MEDICATIONS:  Current Outpatient Medications  Medication Sig Dispense Refill   acetaminophen (TYLENOL) 500 MG tablet Take 500 mg by mouth every 6 (six) hours as needed for moderate pain.     aspirin 81 MG tablet Take 81 mg by mouth daily.     atenolol (TENORMIN) 25 MG tablet Take 25 mg by mouth 2 (two) times daily.     calcium carbonate (TUMS - DOSED IN MG ELEMENTAL CALCIUM) 500 MG chewable tablet Chew 2 tablets by mouth daily as needed for indigestion or heartburn.     diphenhydrAMINE (BENADRYL) 25 MG tablet Take 50 mg by mouth daily as needed for allergies.     ferrous sulfate 325 (65 FE) MG tablet Take 325 mg by mouth every Monday, Wednesday, and Friday.     lidocaine-prilocaine (EMLA) cream Apply to the Port-A-Cath site 30-60 minutes before treatment 30 g 0   nitroGLYCERIN (NITROSTAT) 0.4 MG SL tablet Place 0.4 mg under the tongue every 5 (five)  minutes as needed for chest pain.     omeprazole (PRILOSEC) 10 MG capsule Take 10 mg by mouth daily.     oxymetazoline (AFRIN) 0.05 % nasal spray Place 1 spray into both nostrils 2 (two) times daily as needed for congestion.     prochlorperazine (COMPAZINE) 10 MG tablet Take 1 tablet (10 mg total) by mouth every 6 (six) hours as needed for nausea or vomiting. 30 tablet 0   rosuvastatin (CRESTOR) 10 MG tablet Take 10 mg by mouth daily.     sulfamethoxazole-trimethoprim (BACTRIM DS) 800-160 MG per tablet Take 1 tablet by mouth 2 (two)  times daily. 800-160 mg 60 tablet 11   No current facility-administered medications for this visit.   Facility-Administered Medications Ordered in Other Visits  Medication Dose Route Frequency Provider Last Rate Last Admin   sodium chloride (PF) 0.9 % injection             SURGICAL HISTORY:  Past Surgical History:  Procedure Laterality Date   ABDOMINAL AORTIC ANEURYSM REPAIR  03/03/2010   AXILLARY-FEMORAL BYPASS GRAFT  07/22/2010   bilateral   bowel     herniated bowel   CARDIAC CATHETERIZATION     CORONARY STENT PLACEMENT     CYSTOSCOPY     IR IMAGING GUIDED PORT INSERTION  12/27/2020   LAMINECTOMY N/A 10/22/2020   Procedure: Lumbar five Open Laminectomy for tumor resection;  Surgeon: Judith Part, MD;  Location: [facility];  Service: Neurosurgery;  Laterality: N/A;   MOHS SURGERY     several     REVIEW OF SYSTEMS:  Constitutional: positive for fatigue Eyes: negative Ears, nose, mouth, throat, and face: negative Respiratory: negative Cardiovascular: negative Gastrointestinal: negative Genitourinary:negative Integument/breast: negative Hematologic/lymphatic: negative Musculoskeletal:positive for muscle weakness Neurological: negative Behavioral/Psych: negative Endocrine: negative Allergic/Immunologic: negative   PHYSICAL EXAMINATION: General appearance: alert, cooperative, fatigued, and no distress Head: Normocephalic, without obvious abnormality, atraumatic Neck: no adenopathy, no JVD, supple, symmetrical, trachea midline, and thyroid not enlarged, symmetric, no tenderness/mass/nodules Lymph nodes: Cervical, supraclavicular, and axillary nodes normal. Resp: clear to auscultation bilaterally Back: symmetric, no curvature. ROM normal. No CVA tenderness. Cardio: regular rate and rhythm, S1, S2 normal, no murmur, click, rub or gallop GI: soft, non-tender; bowel sounds normal; no masses,  no organomegaly Extremities: extremities normal, atraumatic, no cyanosis or  edema Neurologic: Alert and oriented X 3, normal strength and tone. Normal symmetric reflexes. Normal coordination and gait  ECOG PERFORMANCE STATUS: 1 - Symptomatic but completely ambulatory  Blood pressure 100/67, pulse 77, temperature 98 F (36.7 C), temperature source Tympanic, resp. rate 17, height 5' 9"  (1.753 m), weight 160 lb 8 oz (72.8 kg), SpO2 96 %.  LABORATORY DATA: Lab Results  Component Value Date   WBC 10.6 (H) 03/25/2021   HGB 10.2 (L) 03/25/2021   HCT 32.0 (L) 03/25/2021   MCV 101.9 (H) 03/25/2021   PLT 90 (L) 03/25/2021      Chemistry      Component Value Date/Time   NA 140 03/25/2021 0943   K 5.1 03/25/2021 0943   CL 110 03/25/2021 0943   CO2 20 (L) 03/25/2021 0943   BUN 16 03/25/2021 0943   CREATININE 0.93 03/25/2021 0943      Component Value Date/Time   CALCIUM 8.5 (L) 03/25/2021 0943   ALKPHOS 115 03/25/2021 0943   AST 10 (L) 03/25/2021 0943   ALT <6 03/25/2021 0943   BILITOT 0.3 03/25/2021 0943       RADIOGRAPHIC STUDIES: No results found.  ASSESSMENT AND PLAN: This is a very pleasant 80 years old white male recently diagnosed with extensive stage (T2 a, N2, M1 C) small cell lung cancer presented with right upper lobe lesion with extensive mediastinal and right hilar adenopathy in addition to extensive bone metastasis as well as metastatic disease to the adrenal gland bilaterally and multiple brain lesions diagnosed in February 2022 status post resection of the epidural tumor at the L5 as well as palliative radiotherapy to the lesion after resection. The patient is currently undergoing systemic chemotherapy with carboplatin for AUC of 5 on day 1, etoposide 100 Mg/M2 on days 1, 2 and 3 with Cosela on the days of the chemotherapy.  Status post 4 cycles He also required Neulasta injection recently. The patient continues to tolerate his treatment fairly well with no concerning adverse effect except for fatigue. He had repeat CT scan of the chest,  abdomen pelvis performed recently.  I personally and independently reviewed the scan images and I did not see clear signs for disease progression but the final report is still pending. I recommended for the patient to continue with the maintenance treatment with Imfinzi every 4 weeks starting today. If the final report of the scan showed any evidence for disease progression, we will call the patient with as a recommendation. For the metastatic brain lesion, he will see Dr. Lisbeth Renshaw for consideration of whole brain irradiation. The patient will come back for follow-up visit in 4 weeks for evaluation before the next cycle of his treatment. He was advised to call if he has any concerning symptoms in the interval. The patient voices understanding of current disease status and treatment options and is in agreement with the current care plan.  All questions were answered. The patient knows to call the clinic with any problems, questions or concerns. We can certainly see the patient much sooner if necessary. The total time spent in the appointment was 60 minutes.  Disclaimer: This note was dictated with voice recognition software. Similar sounding words can inadvertently be transcribed and may not be corrected upon review.

## 2021-03-25 NOTE — Progress Notes (Signed)
Per MD Julien Nordmann, ok to treat with platelets today

## 2021-03-25 NOTE — Progress Notes (Signed)
Nutrition Follow-up:  Patient with metastatic small cell lung cancer. He is receiving systemic chemotherapy with carboplatin.  Met with patient in infusion. Patient denies nutrition impact symptoms. He is having some fatigue, but reports wonderful recent visit from grandchildren and watching his grandson complete in youth travel baseball tournament. Patient was able to attend 2 of the 5 games in which his grandson played exceptionally well. He reports his appetite is good and continues to eat 3 meals with snacks in between. Yesterday patient had 2 eggs, bacon, 1/2 grapefruit, coffee for breakfast, [place] sandwich with provolone, lettuce, tomato, pear for lunch, snickers ice cream bar for snack, and angel hair pasta with meat sauce, salad for dinner. He is drinking 32 oz of water daily. Patient likes to make his own milkshakes made with [employer] ice cream which he does frequently. Patient tried samples of [employer] and [employer] supplement provided at last visit. He did not like the [employer], but says [employer] was the best one he has tasted. Patient does not feel he needs to add daily supplement, but interested in receiving 3 additional [employer] samples to have as needed. Patient reports he is unable to recall question he wanted to ask during visit today. He has card with contact information and was encouraged to call with questions/concerns.    Medications: reviewed  Labs: reviewed  Anthropometrics: Weight 160 lb 8 oz today stable   6/7 - 160 lb 4.8 oz 5/19 - 165 lb 12.38 oz 5/17 - 161 lb 3.2 oz   NUTRITION DIAGNOSIS: Food and nutrition related knowledge deficit improved   INTERVENTION:  Continue eating high calorie high protein foods for weight maintenance  Patient liked [employer], with permission of pt additional [employer] to be mailed to pt residence  Pt has contact information  MONITORING, EVALUATION, GOAL: weight trends, intake   NEXT VISIT: Tuesday July 26 in  infusion

## 2021-03-28 ENCOUNTER — Ambulatory Visit (HOSPITAL_COMMUNITY): Payer: PPO

## 2021-04-07 ENCOUNTER — Ambulatory Visit (HOSPITAL_COMMUNITY)
Admission: RE | Admit: 2021-04-07 | Discharge: 2021-04-07 | Disposition: A | Discharge status: Home / Self Care | Payer: PPO | Source: Ambulatory Visit | Attending: Internal Medicine | Admitting: Internal Medicine

## 2021-04-07 ENCOUNTER — Other Ambulatory Visit: Payer: Self-pay

## 2021-04-07 DIAGNOSIS — C3491 Malignant neoplasm of unspecified part of right bronchus or lung: Secondary | ICD-10-CM | POA: Diagnosis not present

## 2021-04-07 DIAGNOSIS — N281 Cyst of kidney, acquired: Secondary | ICD-10-CM | POA: Diagnosis not present

## 2021-04-07 DIAGNOSIS — K769 Liver disease, unspecified: Secondary | ICD-10-CM | POA: Diagnosis not present

## 2021-04-07 DIAGNOSIS — K838 Other specified diseases of biliary tract: Secondary | ICD-10-CM | POA: Diagnosis not present

## 2021-04-07 DIAGNOSIS — K449 Diaphragmatic hernia without obstruction or gangrene: Secondary | ICD-10-CM | POA: Diagnosis not present

## 2021-04-07 IMAGING — MR MR ABDOMEN WO/W CM
18 series · 48 of 48 positions shown · IV contrast (7ml GADAVIST)
Comparison: CT [DATE], CT [9V]

CLINICAL DATA: Small cell lung cancer. Indeterminate lesion within
liver on CT exam

EXAM:
MRI ABDOMEN WITHOUT AND WITH CONTRAST
TECHNIQUE: Multiplanar multisequence MR imaging of the abdomen was performed
both before and after the administration of intravenous contrast.
CONTRAST:  7mL GADAVIST GADOBUTROL 1 MMOL/ML IV SOLN

[Series 3: cor haste · coronal · 6.0mm · 1.56mm/px · 2 of 38 slices shown]
[im 1/38]
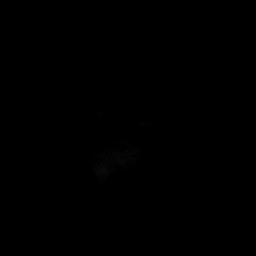
[im 38/38]
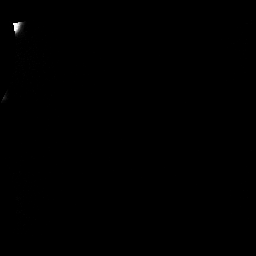

[Series 4: DWI · axial · 6.0mm · 1.57mm/px · z∈[-265,+16]mm · 4 of 80 slices shown (1 of 2)]
[im 1/80]
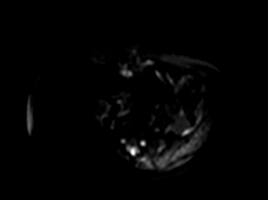
[im 27/80]
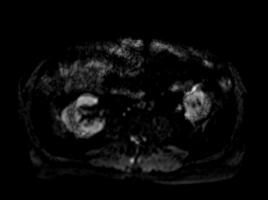
[im 53/80]
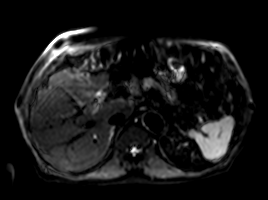
[im 80/80]
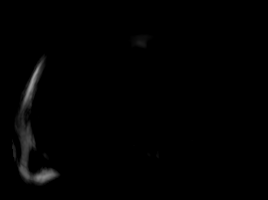

[Series 5: DWI · axial · 6.0mm · 1.57mm/px · z∈[-265,+16]mm · 2 of 40 slices shown (2 of 2)]
[im 1/40]
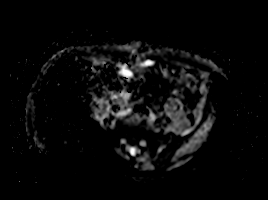
[im 40/40]
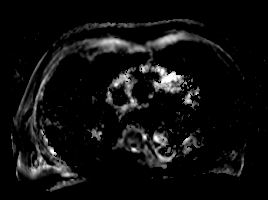

[Series 6: T2 fat-sat · axial · 6.0mm · 1.25mm/px · z∈[-238,+14]mm · 2 of 36 slices shown]
[im 1/36]
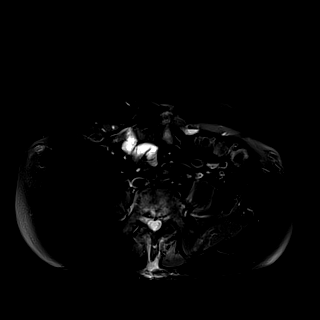
[im 36/36]
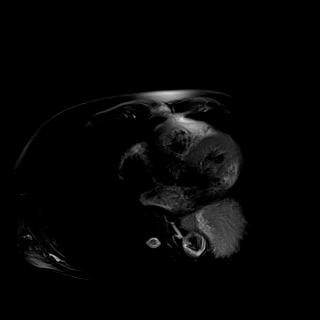

[Series 8: bSSFP · axial · 6.0mm · 1.09mm/px · 1 of 41 slices shown]
[im 1/41]
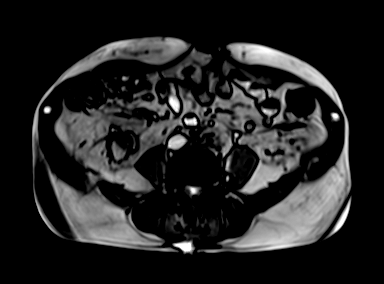

[Series 9: T1 dynamic · axial · 3.0mm · 1.31mm/px · z∈[-230,+31]mm · 3 of 88 slices shown (1 of 9)]
[im 1/88]
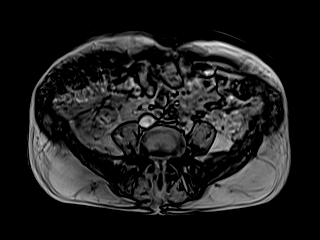
[im 44/88]
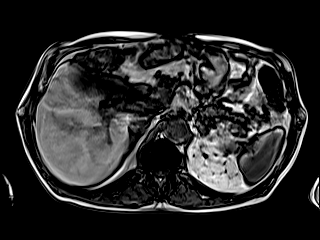
[im 88/88]
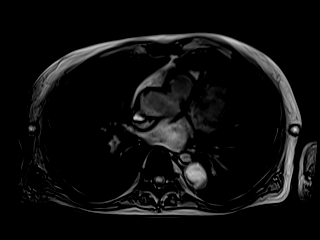

[Series 10: T1 dynamic · axial · 3.0mm · 1.31mm/px · z∈[-230,+31]mm · 3 of 88 slices shown (2 of 9)]
[im 1/88]
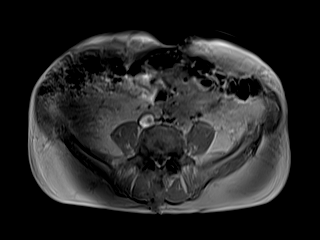
[im 44/88]
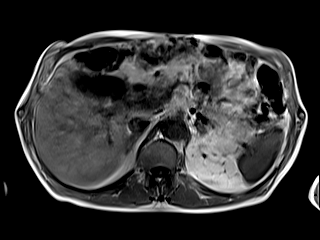
[im 88/88]
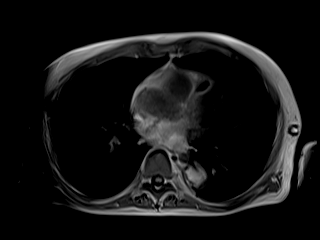

[Series 17: T1 dynamic · axial · 3.0mm · 1.31mm/px · z∈[-230,+31]mm · 3 of 88 slices shown (3 of 9)]
[im 1/88]
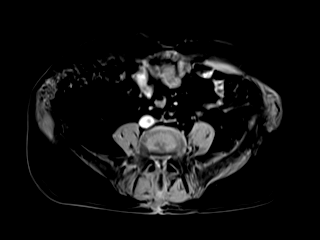
[im 44/88]
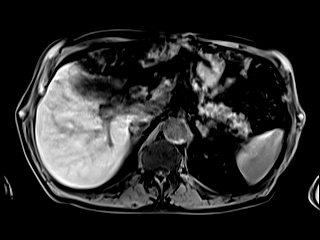
[im 88/88]
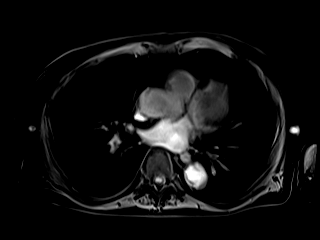

[Series 19: T1 dynamic · axial · 3.0mm · 1.31mm/px · z∈[-230,+31]mm · 3 of 88 slices shown (4 of 9)]
[im 1/88]
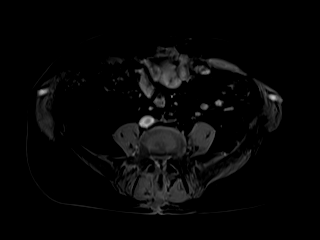
[im 44/88]
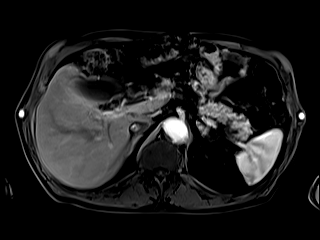
[im 88/88]
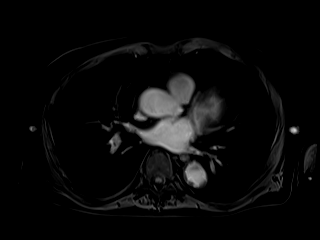

[Series 20: T1 dynamic · axial · 3.0mm · 1.31mm/px · z∈[-230,+31]mm · 3 of 88 slices shown (5 of 9)]
[im 1/88]
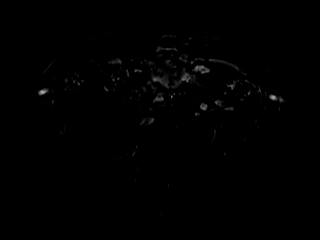
[im 44/88]
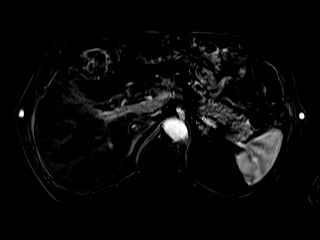
[im 88/88]
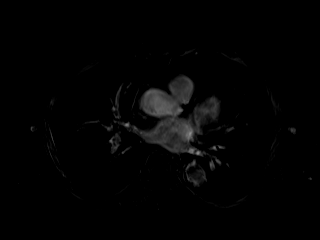

[Series 22: T1 dynamic · axial · 3.0mm · 1.31mm/px · z∈[-230,+31]mm · 3 of 88 slices shown (6 of 9)]
[im 1/88]
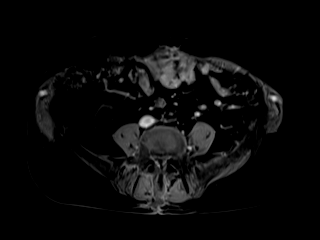
[im 44/88]
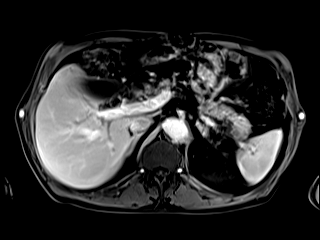
[im 88/88]
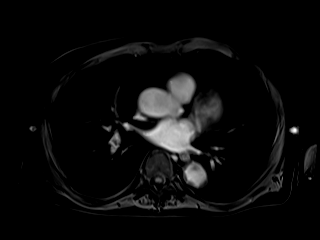

[Series 23: T1 dynamic · axial · 3.0mm · 1.31mm/px · z∈[-230,+31]mm · 3 of 88 slices shown (7 of 9)]
[im 1/88]
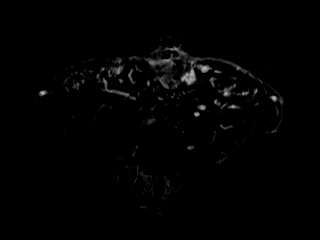
[im 44/88]
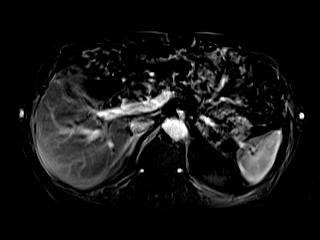
[im 88/88]
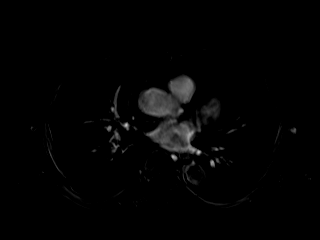

[Series 25: T1 dynamic · axial · 3.0mm · 1.31mm/px · z∈[-230,+31]mm · 3 of 88 slices shown (8 of 9)]
[im 1/88]
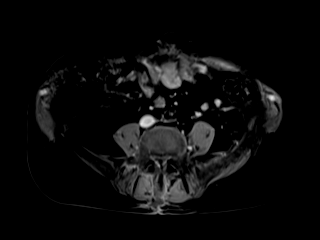
[im 44/88]
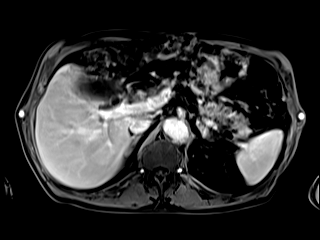
[im 88/88]
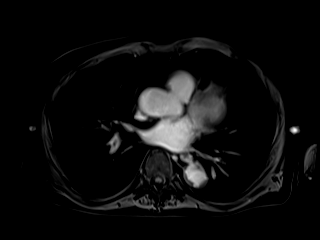

[Series 26: T1 dynamic · axial · 3.0mm · 1.31mm/px · z∈[-230,+31]mm · 3 of 88 slices shown (9 of 9)]
[im 1/88]
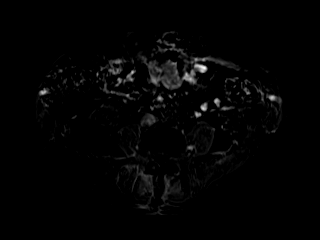
[im 44/88]
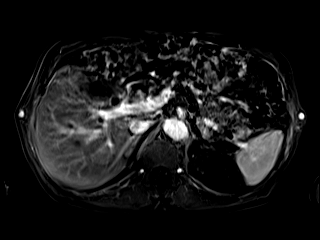
[im 88/88]
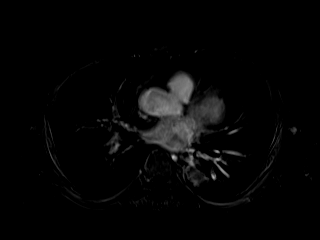

[Series 27: ax_haste_mbh · axial · 6.0mm · 1.31mm/px · 1 of 36 slices shown]
[im 1/36]
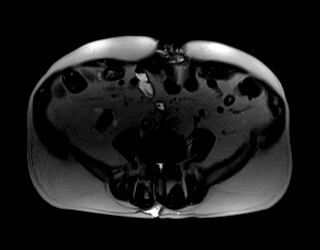

[Series 29: cor_vibe_dixon_delayed_w · coronal · 3.0mm · 2.34mm/px · 3 of 88 slices shown]
[im 1/88]
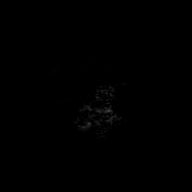
[im 44/88]
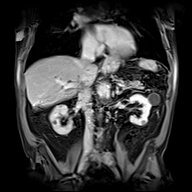
[im 88/88]
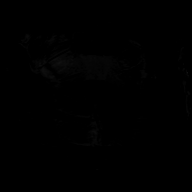

[Series 31: ax_dixon_delayed_w_reg · axial · 3.0mm · 1.31mm/px · z∈[-230,+31]mm · 3 of 88 slices shown]
[im 1/88]
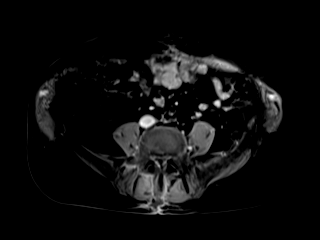
[im 44/88]
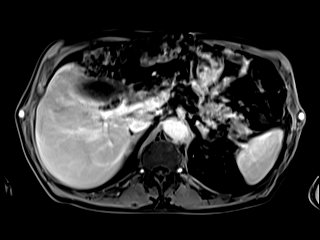
[im 88/88]
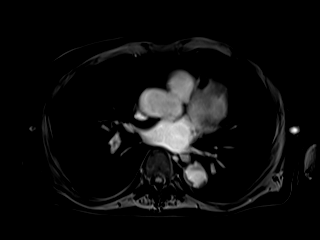

[Series 32: ax_dixon_delayed_w_reg_sub · axial · 3.0mm · 1.31mm/px · z∈[-230,+31]mm · 3 of 88 slices shown]
[im 1/88]
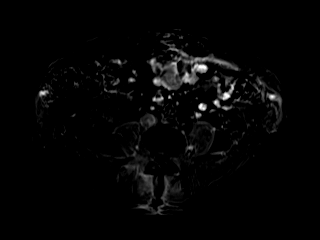
[im 44/88]
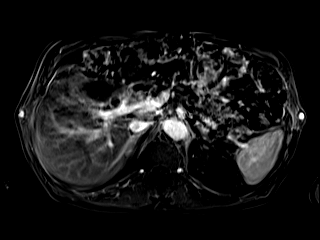
[im 88/88]
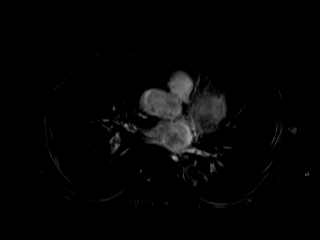

[48 of 48 positions shown; findings below may reference images not displayed]

FINDINGS: Lower chest:  Lung bases are clear.

Hepatobiliary: There multiple well-circumscribed round lesions in
the LEFT and RIGHT hepatic lobe which are hyperintense on T2
weighted imaging suggesting benign cysts. These demonstrate no
post-contrast enhancement.

The lesion of concern in the RIGHT hepatic lobe measures 12 mm
(image [DATE]) and does not have measurable post-contrast enhancement
(image 37/series 22). Subtracting imaging also fails to demonstrate
postcontrast enhancement (image [DATE]). The other lesion of concern
on prior CT is smaller in the RIGHT hepatic lobe and has
hyperintense signal on T2 weighted imaging also without significant
enhancement (image 55/22).

There is significant dilatation of the common hepatic duct (13 mm)
and the common bile duct (10 mm). Gallbladder is normal. No
gallstones. No choledocholithiasis. No obstructing lesion identified
in the pancreas. Patient's bilirubin is normal.

Pancreas: Normal pancreatic parenchymal intensity. No ductal
dilatation or inflammation.

Spleen: Normal spleen.

Adrenals/urinary tract: Adrenal glands normal.

There are bilateral nonenhancing renal cortical and exophytic cysts.
One larger cyst exophytic from the LEFT kidney does have inherent T1
shortening but no post-contrast enhancement (2.8 cm on image 57/17
and series 20).

Stomach/Bowel: Large hiatal hernia.  No abnormality of the bowel.

Vascular/Lymphatic: No retroperitoneal adenopathy. No upper
abdominal adenopathy. Aorta normal

Musculoskeletal: No aggressive osseous lesion
IMPRESSION: 1. While the lesions in the RIGHT hepatic lobe appear new on most
recent CT scan, the lesions have imaging characteristics most
consistent with benign hepatic cysts. Recommend close attention on
follow-up contrast CT or contrast MRI.
2. Dilatation of the extrahepatic bile ducts without obstructing
lesion identified. Normal bilirubin. Favor benign dilatation.
3. Pancreas normal.
4. Benign renal cysts.

## 2021-04-07 MED ORDER — GADOBUTROL 1 MMOL/ML IV SOLN
7.0000 mL | Freq: Once | INTRAVENOUS | Status: AC | PRN
  Administered 2021-04-07: 7 mL via INTRAVENOUS

## 2021-04-11 ENCOUNTER — Other Ambulatory Visit: Payer: Self-pay | Admitting: Physician Assistant

## 2021-04-15 NOTE — Progress Notes (Signed)
[facility] OFFICE PROGRESS NOTE  [name], MD [address] [place] [address]  DIAGNOSIS: Extensive stage (T2 a, N2, M1 C) small cell lung cancer presented with right hilar mass in addition to mediastinal lymphadenopathy as well as metastatic disease to the bone with complete replacement of the L5 status post laminectomy with resection of epidural tumor as well as metastatic disease to the brain, bones and bilateral adrenal glands diagnosed in January 2022.   PRIOR THERAPY: 1) Status post palliative radiotherapy to the resected area at L5 under the care of Dr. Lisbeth Renshaw. 2) palliative radiotherapy to the right hip under the care of Dr. Lisbeth Renshaw last treatment on 01/09/2021.  CURRENT THERAPY: 1)  Systemic chemotherapy with carboplatin for AUC of 5 from day 1, etoposide 100 mg/M2 on days 1, 2 and 3 with Cosela 240 mg/M2 before chemotherapy in addition to Imfinzi 1500 mg IV every 3 weeks with day one of the chemotherapy.  First dose April 4th, 2022. Neulasta support was added starting with cycle #2. Status post 5 cycles. Starting from cycle #5, the patient began maintenance immunotherapy with Imfinzi.   INTERVAL HISTORY: RORAN WEGNER 80 y.o. male returns to the clinic today for a follow up visit. The patient is feeling fairly well today without any concerning complaints. He is just wondering what the next steps are for his care now that he is on maintenance immunotherapy. He is hoping to start golfing again twice a week and going to his fitness center. Otherwise the patient denies any recent fever or night sweats. His appetite is "much better" now that he completed chemotherapy. He is scheduled to see a member of the nutritionist team today while in the infusion room. 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 He denies any  significant shortness of breath with exertion, chest pain, or hemoptysis.  Denies significant cough.  He denies any nausea and vomiting. He denies constipation or diarrhea. He denies headaches or visual changes. His last brain MRI was in May 2022. He was supposed to see radiation oncology for his history of brain metastases but was reluctant to do so when he was undergoing chemotherapy because he was too fatigued and had too many appointments. He is wondering if he can take the COVID-19 booster vaccines. He is here for evaluation and to review his scan results before starting cycle #6.   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  MEDICATIONS:  Current Outpatient Medications  Medication Sig Dispense Refill   acetaminophen (TYLENOL) 500 MG tablet Take 500 mg by mouth every 6 (six) hours as needed for moderate  pain.     aspirin 81 MG tablet Take 81 mg by mouth daily.     atenolol (TENORMIN) 25 MG tablet Take 25 mg by mouth 2 (two) times daily.     calcium carbonate (TUMS - DOSED IN MG ELEMENTAL CALCIUM) 500 MG chewable tablet Chew 2 tablets by mouth daily as needed for indigestion or heartburn.     diphenhydrAMINE (BENADRYL) 25 MG tablet Take 50 mg by mouth daily as needed for allergies.      ferrous sulfate 325 (65 FE) MG tablet Take 325 mg by mouth every Monday, Wednesday, and Friday.     lidocaine-prilocaine (EMLA) cream Apply to the Port-A-Cath site 30-60 minutes before treatment 30 g 0   nitroGLYCERIN (NITROSTAT) 0.4 MG SL tablet Place 0.4 mg under the tongue every 5 (five) minutes as needed for chest pain.     omeprazole (PRILOSEC) 10 MG capsule Take 10 mg by mouth daily.     oxymetazoline (AFRIN) 0.05 % nasal spray Place 1 spray into both nostrils 2 (two) times daily as needed for congestion.     prochlorperazine (COMPAZINE) 10 MG tablet Take 1 tablet (10 mg total) by mouth every 6 (six) hours as needed for nausea or vomiting. 30 tablet 0   rosuvastatin (CRESTOR) 10 MG tablet Take 10 mg by mouth daily.     sulfamethoxazole-trimethoprim (BACTRIM DS) 800-160 MG per tablet Take 1 tablet by mouth 2 (two) times daily. 800-160 mg 60 tablet 11   No current facility-administered medications for this visit.    SURGICAL HISTORY:  Past Surgical History:  Procedure Laterality Date   ABDOMINAL AORTIC ANEURYSM REPAIR  03/03/2010   AXILLARY-FEMORAL BYPASS GRAFT  07/22/2010   bilateral   bowel     herniated bowel   CARDIAC CATHETERIZATION     CORONARY STENT PLACEMENT     CYSTOSCOPY     IR IMAGING GUIDED PORT INSERTION  12/27/2020   LAMINECTOMY N/A 10/22/2020   Procedure: Lumbar five Open Laminectomy for tumor resection;  Surgeon: Judith Part, MD;  Location: [facility];  Service: Neurosurgery;  Laterality: N/A;   MOHS SURGERY     several     REVIEW OF SYSTEMS:   Review of Systems  Constitutional: Positive for fatigue (improved). Negative for appetite change, chills, fever and unexpected weight change.  HENT:   Negative for mouth sores, nosebleeds, sore throat and trouble swallowing.   Eyes: Negative for eye problems and icterus.  Respiratory: Negative for cough, hemoptysis, shortness of breath and wheezing.   Cardiovascular: Negative for chest pain and leg swelling.   Gastrointestinal: Negative for abdominal pain, constipation, diarrhea, nausea and vomiting.  Genitourinary: Negative for bladder incontinence, difficulty urinating, dysuria, frequency and hematuria.   Musculoskeletal: Negative for back pain, gait problem, neck pain and neck stiffness.  Skin: Negative for itching and rash.  Neurological: Negative for dizziness, extremity weakness, gait problem, headaches, light-headedness and seizures.  Hematological: Positive for senile purpura on upper extremities. Negative for adenopathy. Does not bleed easily.  Psychiatric/Behavioral: Negative for confusion, depression and sleep disturbance. The patient is not nervous/anxious.     PHYSICAL EXAMINATION:  Blood pressure (!) 117/56, pulse 63, temperature (!) 97 F (36.1 C), temperature source Oral, resp. rate 17, height 5' 9"  (1.753 m), weight 161 lb 1.6 oz (73.1 kg), SpO2 100 %.  ECOG PERFORMANCE STATUS: 1-2  Physical Exam  Constitutional: Oriented to person, place, and time thin appearing male and in no distress. HENT: Head: Normocephalic and atraumatic. Mouth/Throat: Oropharynx is clear  and moist. No oropharyngeal exudate. Eyes: Conjunctivae are normal. Right eye exhibits no discharge. Left eye exhibits no discharge. No scleral icterus. Neck: Normal range of motion. Neck supple. Cardiovascular: Normal rate, regular rhythm, normal heart sounds and intact distal pulses.   Pulmonary/Chest: Effort normal and breath sounds normal. Mild wheezing noted bilaterally. No respiratory distress. No rales. Abdominal: Soft. Large hernia. Bowel sounds are normal. Exhibits no distension and no mass. There is no tenderness.  Musculoskeletal: Normal range of motion. Exhibits no edema.  Lymphadenopathy:    No cervical adenopathy.  Neurological: Alert and oriented to person, place, and time. Exhibits muscle wasting.  Examined in the wheelchair. Skin: Skin is warm and dry. No rash noted. Not diaphoretic. No erythema. No  pallor. Senile purpura noted on upper extremities bilaterally.  Psychiatric: Mood, memory and judgment normal. Vitals reviewed.  LABORATORY DATA: Lab Results  Component Value Date   WBC 4.8 04/22/2021   HGB 12.0 (L) 04/22/2021   HCT 37.1 (L) 04/22/2021   MCV 102.8 (H) 04/22/2021   PLT 79 (L) 04/22/2021      Chemistry      Component Value Date/Time   NA 140 04/22/2021 0854   K 5.1 04/22/2021 0854   CL 111 04/22/2021 0854   CO2 22 04/22/2021 0854   BUN 22 04/22/2021 0854   CREATININE 1.08 04/22/2021 0854      Component Value Date/Time   CALCIUM 8.9 04/22/2021 0854   ALKPHOS 84 04/22/2021 0854   AST 9 (L) 04/22/2021 0854   ALT <6 04/22/2021 0854   BILITOT 0.4 04/22/2021 0854       RADIOGRAPHIC STUDIES:  CT Chest W Contrast  Result Date: 03/25/2021 CLINICAL DATA:  Primary Cancer Type: Lung Imaging Indication: Assess response to therapy Interval therapy since last imaging? Yes Initial Cancer Diagnosis Date: 10/22/2020; Established by: Biopsy-proven Detailed Pathology: Extensive stage small cell lung cancer. Primary Tumor location: Right upper lobe. Metastatic disease to the brain, bones and bilateral adrenal glands. Surgeries: L5 laminectomy, resection of epidural tumor 10/22/2020. AAA repair and axillary-femoral bypass graft 2011. Coronary stent. Chemotherapy: Yes; Ongoing? Yes; Most recent administration: 03/04/2021 Immunotherapy?  Yes; Type: Imfinzi; Ongoing? Yes Radiation therapy? Yes; Date Range: 12/30/2020 - 01/16/2021; Target: Right pelvis, ribs, chest EXAM: CT CHEST, ABDOMEN, AND PELVIS WITH CONTRAST TECHNIQUE: Multidetector CT imaging of the chest, abdomen and pelvis was performed following the standard protocol during bolus administration of intravenous contrast. CONTRAST:  175m OMNIPAQUE IOHEXOL 300 MG/ML  SOLN COMPARISON:  Most recent CT chest, abdomen and pelvis 02/10/2021. 12/19/2020 PET-CT. FINDINGS: CT CHEST FINDINGS Cardiovascular: The heart size appears upper  limits of normal. Aortic atherosclerosis and multi vessel coronary artery calcifications. Status post bilateral axillary tip femoral bypass grafts noted. No pericardial effusion. Mediastinum/Nodes: Normal appearance of the thyroid gland. The trachea appears patent and is midline. Large hiatal hernia identified. No enlarged mediastinal or hilar lymph nodes. Lungs/Pleura: Moderate centrilobular emphysema. No pleural effusion, airspace consolidation, or atelectasis. Calcified granulomas identified within the left upper lobe and right upper lobe. No suspicious lung nodules. Musculoskeletal: Slightly expansile lucent rib lesion involving the right posterior rib with adjacent fracture appears unchanged from previous exam, image 37/2. Permeative lesion involving the lateral aspect of the left fourth rib appears slightly more sclerotic in the interval suggesting healing. CT ABDOMEN PELVIS FINDINGS Hepatobiliary: There is atrophy of the medial segment of left hepatic lobe. Scattered small liver cysts are again noted. Within the right hepatic lobe there is a new low-attenuation lesion measuring 1.1 cm, image  62/2. Within the inferior right hepatic lobe there is a new low-attenuation structure measuring 7 mm, image 71/2. Remaining low-attenuation liver lesions near the dome appear unchanged from 10/16/2020 and are favored to represent small cysts. There is mild diffuse gallbladder wall thickening, similar to previous exam. No gallstones identified. Increased caliber of the common bile duct measures 1.2 cm, unchanged. No choledocholithiasis or obstructing mass noted. Pancreas: Unremarkable. No pancreatic ductal dilatation or surrounding inflammatory changes. Spleen: Normal in size without focal abnormality. Adrenals/Urinary Tract: Normal appearance of the right adrenal gland. The left adrenal gland metastasis measures 2.1 x 1.1 cm, image 68/2. Formally this measured 2.2 x 1.4 cm. Unchanged appearance of exophytic cyst arising  off the lateral cortex of left kidney measuring 3.1 cm. Additional, smaller bilateral kidney cyst appears similar. Bilateral kidney stones, unchanged. No hydronephrosis. Urinary bladder appears unremarkable. Stomach/Bowel: Large hiatal hernia. There is also a large ventral abdominal wall hernia which contains nonobstructed loops of small bowel. The appendix is visualized and appears normal. No bowel wall thickening, inflammation, or distension. Distal colonic diverticulosis identified without acute inflammation. 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 No signs of abdominopelvic adenopathy. Reproductive: Prostate is unremarkable. Other: Ventral abdominal wall hernia which contains nonobstructed loops of small bowel. No ascites or focal fluid collections identified. Musculoskeletal: Sclerotic lesion in the anterior column of right acetabulum with age-indeterminate pathologic fracture is unchanged from previous exam. Sclerosis and superior endplate fracture involving the L5 vertebral body is unchanged. IMPRESSION: 1. No signs of residual or recurrent tumor within the chest. 2. Stable appearance of treated metastasis to the left adrenal gland. 3. There are 2 new low-attenuation lesions within the right hepatic lobe. Suspicious for metastatic disease. More definitive characterization could be obtained with contrast enhanced MRI of the liver. 4. Stable appearance of osseous metastasis 5. Emphysema and aortic atherosclerosis. 6. Large hiatal hernia. 7. Bilateral nephrolithiasis. 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 femoral bypass grafting. Aortic Atherosclerosis (ICD10-I70.0) and Emphysema (ICD10-J43.9). Electronically Signed   By: Kerby Moors M.D.   On: 03/25/2021 11:38   CT Abdomen Pelvis W Contrast  Result Date: 03/25/2021 CLINICAL DATA:  Primary Cancer Type: Lung Imaging  Indication: Assess response to therapy Interval therapy since last imaging? Yes Initial Cancer Diagnosis Date: 10/22/2020; Established by: Biopsy-proven Detailed Pathology: Extensive stage small cell lung cancer. Primary Tumor location: Right upper lobe. Metastatic disease to the brain, bones and bilateral adrenal glands. Surgeries: L5 laminectomy, resection of epidural tumor 10/22/2020. AAA repair and axillary-femoral bypass graft 2011. Coronary stent. Chemotherapy: Yes; Ongoing? Yes; Most recent administration: 03/04/2021 Immunotherapy?  Yes; Type: Imfinzi; Ongoing? Yes Radiation therapy? Yes; Date Range: 12/30/2020 - 01/16/2021; Target: Right pelvis, ribs, chest EXAM: CT CHEST, ABDOMEN, AND PELVIS WITH CONTRAST TECHNIQUE: Multidetector CT imaging of the chest, abdomen and pelvis was performed following the standard protocol during bolus administration of intravenous contrast. CONTRAST:  132m OMNIPAQUE IOHEXOL 300 MG/ML  SOLN COMPARISON:  Most recent CT chest, abdomen and pelvis 02/10/2021. 12/19/2020 PET-CT. FINDINGS: CT CHEST FINDINGS Cardiovascular: The heart size appears upper limits of normal. Aortic atherosclerosis and multi vessel coronary artery calcifications. Status post bilateral axillary tip femoral bypass grafts noted. No pericardial effusion. Mediastinum/Nodes: Normal appearance of the thyroid gland. The trachea appears patent and is midline. Large hiatal hernia identified. No enlarged mediastinal or hilar lymph nodes. Lungs/Pleura: Moderate centrilobular emphysema. No pleural effusion, airspace consolidation, or atelectasis.  Calcified granulomas identified within the left upper lobe and right upper lobe. No suspicious lung nodules. Musculoskeletal: Slightly expansile lucent rib lesion involving the right posterior rib with adjacent fracture appears unchanged from previous exam, image 37/2. Permeative lesion involving the lateral aspect of the left fourth rib appears slightly more sclerotic in the  interval suggesting healing. CT ABDOMEN PELVIS FINDINGS Hepatobiliary: There is atrophy of the medial segment of left hepatic lobe. Scattered small liver cysts are again noted. Within the right hepatic lobe there is a new low-attenuation lesion measuring 1.1 cm, image 62/2. Within the inferior right hepatic lobe there is a new low-attenuation structure measuring 7 mm, image 71/2. Remaining low-attenuation liver lesions near the dome appear unchanged from 10/16/2020 and are favored to represent small cysts. There is mild diffuse gallbladder wall thickening, similar to previous exam. No gallstones identified. Increased caliber of the common bile duct measures 1.2 cm, unchanged. No choledocholithiasis or obstructing mass noted. Pancreas: Unremarkable. No pancreatic ductal dilatation or surrounding inflammatory changes. Spleen: Normal in size without focal abnormality. Adrenals/Urinary Tract: Normal appearance of the right adrenal gland. The left adrenal gland metastasis measures 2.1 x 1.1 cm, image 68/2. Formally this measured 2.2 x 1.4 cm. Unchanged appearance of exophytic cyst arising off the lateral cortex of left kidney measuring 3.1 cm. Additional, smaller bilateral kidney cyst appears similar. Bilateral kidney stones, unchanged. No hydronephrosis. Urinary bladder appears unremarkable. Stomach/Bowel: Large hiatal hernia. There is also a large ventral abdominal wall hernia which contains nonobstructed loops of small bowel. The appendix is visualized and appears normal. No bowel wall thickening, inflammation, or distension. Distal colonic diverticulosis identified without acute inflammation. 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 No signs of abdominopelvic adenopathy. Reproductive: Prostate is unremarkable. Other: Ventral abdominal wall hernia which contains nonobstructed loops of small bowel. No ascites or focal fluid collections identified.  Musculoskeletal: Sclerotic lesion in the anterior column of right acetabulum with age-indeterminate pathologic fracture is unchanged from previous exam. Sclerosis and superior endplate fracture involving the L5 vertebral body is unchanged. IMPRESSION: 1. No signs of residual or recurrent tumor within the chest. 2. Stable appearance of treated metastasis to the left adrenal gland. 3. There are 2 new low-attenuation lesions within the right hepatic lobe. Suspicious for metastatic disease. More definitive characterization could be obtained with contrast enhanced MRI of the liver. 4. Stable appearance of osseous metastasis 5. Emphysema and aortic atherosclerosis. 6. Large hiatal hernia. 7. Bilateral nephrolithiasis. 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 femoral bypass grafting. Aortic Atherosclerosis (ICD10-I70.0) and Emphysema (ICD10-J43.9). Electronically Signed   By: Kerby Moors M.D.   On: 03/25/2021 11:38   MR LIVER W WO CONTRAST  Result Date: 04/07/2021 CLINICAL DATA:  Small cell lung cancer. Indeterminate lesion within liver on CT exam EXAM: MRI ABDOMEN WITHOUT AND WITH CONTRAST TECHNIQUE: Multiplanar multisequence MR imaging of the abdomen was performed both before and after the administration of intravenous contrast. CONTRAST:  65m GADAVIST GADOBUTROL 1 MMOL/ML IV SOLN COMPARISON:  CT 04/24/2021, CT 1622 FINDINGS: Lower chest:  Lung bases are clear. Hepatobiliary: There multiple well-circumscribed round lesions in the LEFT and RIGHT hepatic lobe which are hyperintense on T2 weighted imaging suggesting benign cysts. These demonstrate no post-contrast enhancement. The lesion of concern in the RIGHT hepatic lobe measures 12 mm (image 11/6) and does not have measurable post-contrast enhancement (image 37/series 22). Subtracting imaging also fails to demonstrate  postcontrast enhancement (image 26/2). The other lesion of concern on  prior CT is smaller in the RIGHT hepatic lobe and has hyperintense signal on T2 weighted imaging also without significant enhancement (image 55/22). There is significant dilatation of the common hepatic duct (13 mm) and the common bile duct (10 mm). Gallbladder is normal. No gallstones. No choledocholithiasis. No obstructing lesion identified in the pancreas. Patient's bilirubin is normal. Pancreas: Normal pancreatic parenchymal intensity. No ductal dilatation or inflammation. Spleen: Normal spleen. Adrenals/urinary tract: Adrenal glands normal. There are bilateral nonenhancing renal cortical and exophytic cysts. One larger cyst exophytic from the LEFT kidney does have inherent T1 shortening but no post-contrast enhancement (2.8 cm on image 57/17 and series 20). Stomach/Bowel: Large hiatal hernia.  No abnormality of the bowel. Vascular/Lymphatic: No retroperitoneal adenopathy. No upper abdominal adenopathy. Aorta normal Musculoskeletal: No aggressive osseous lesion IMPRESSION: 1. While the lesions in the RIGHT hepatic lobe appear new on most recent CT scan, the lesions have imaging characteristics most consistent with benign hepatic cysts. Recommend close attention on follow-up contrast CT or contrast MRI. 2. Dilatation of the extrahepatic bile ducts without obstructing lesion identified. Normal bilirubin. Favor benign dilatation. 3. Pancreas normal. 4. Benign renal cysts. Electronically Signed   By: Suzy Bouchard M.D.   On: 04/07/2021 12:19     ASSESSMENT/PLAN:  This is a very pleasant 80 year old Caucasian male diagnosed with extensive stage (T2a, N2, M1c) small cell lung cancer.  He presented with a right upper lobe lung lesion with extensive mediastinal and right hilar lymphadenopathy.  He also has extensive bone metastases as well as metastatic disease to the adrenal gland bilaterally and multiple brain lesions.  He was diagnosed in February 2022.     He is status post resection of the epidural tumor  at L5.  He also completed palliative radiotherapy to this lesion after resection.   The patient also completed palliative radiotherapy to the painful right hip lesion in April 2022.    The patient was found to have metastatic disease to the brain.  Per Dr. Julien Nordmann, the plan was to undergo 2 cycles of palliative systemic chemotherapy before proceeding with whole brain radiation. His brain MRI was repeated on 02/10/21. Since there was significant improvement in his metastatic brain lesions and the patient is asymptomatic, Dr. Julien Nordmann would like to complete 4 cycles of chemotherapy before proceeding with whole brain radiation. The patient was supposed to meet with radiation oncology but he canceled his appointments due to having too many appointments. Therefore, Dr. Lisbeth Renshaw recommended brain MRI every 3 months. I have placed the order for a repeat brain MRI next month. Dr. Julien Nordmann discussed with the patient that if there is progression in the brain, he would like him to see radiation oncology, which he was agreeable to. We will discuss the results at his next appointment.    The patient is currently undergoing palliative systemic chemotherapy with carboplatin for an AUC of 5, etoposide 100 mg per metered squared on days 1, 2, and 3 and immunotherapy with Imfinzi 1500 mg IV every 3 weeks with Cosela.  He is status post 5 cycles and tolerated it well without any noticeable adverse side effects except for pancytopenia. We added neulasta from cycle #2. Starting from cycle # 5 the patient began manintance immunotherapy with Imfinzi.   The patient was seen with Dr. Julien Nordmann today. Labs were reviewed. Recommend that he proceed with cycle #6 today as scheduled.   We will see him back for a follow up  visit in 4 weeks for evaluation before starting cycle #7.   I will arrange for a restaging CT scan of the chest, abdomen, and pelvis prior to starting his next cycle of treatment.   Discussed that he can take his COVID  booster vaccine but to take it at least 1 week apart from his immunotherapy.   He will meet with a member of the nutritionist team while in the infusion room today.   The patient was advised to call immediately if he has any concerning symptoms in the interval. The patient voices understanding of current disease status and treatment options and is in agreement with the current care plan. All questions were answered. The patient knows to call the clinic with any problems, questions or concerns. We can certainly see the patient much sooner if necessary         Orders Placed This Encounter  Procedures   CT Chest W Contrast    Standing Status:   Future    Standing Expiration Date:   04/22/2022    Order Specific Question:   If indicated for the ordered procedure, I authorize the administration of contrast media per Radiology protocol    Answer:   Yes    Order Specific Question:   Preferred imaging location?    Answer:   [facility]   CT Abdomen Pelvis W Contrast    Standing Status:   Future    Standing Expiration Date:   04/22/2022    Order Specific Question:   If indicated for the ordered procedure, I authorize the administration of contrast media per Radiology protocol    Answer:   Yes    Order Specific Question:   Preferred imaging location?    Answer:   [facility]    Order Specific Question:   Is Oral Contrast requested for this exam?    Answer:   Yes, Per Radiology protocol   MR Brain W Wo Contrast    Standing Status:   Future    Standing Expiration Date:   04/22/2022    Order Specific Question:   If indicated for the ordered procedure, I authorize the administration of contrast media per Radiology protocol    Answer:   Yes    Order Specific Question:   What is the patient's sedation requirement?    Answer:   No Sedation    Order Specific Question:   Does the patient have a pacemaker or implanted devices?    Answer:   No    Order Specific Question:   Use SRS  Protocol?    Answer:   No    Order Specific Question:   Preferred imaging location?    Answer:   [facility] (table limit - 550 lbs)     Bernardine Langworthy L Halynn Reitano, PA-C 04/22/21  ADDENDUM: Hematology/Oncology Attending: I had a face-to-face encounter with the patient today.  I reviewed his record, lab and recommended his care plan.  This is a very pleasant 80 years old white male with extensive stage small cell lung cancer and currently undergoing systemic chemotherapy with carboplatin, etoposide and Imfinzi status post 5 cycles.  Starting from cycle #5 the patient is on maintenance treatment with single agent Imfinzi. He has been tolerating this treatment well with no concerning adverse effect except for fatigue. I recommended for the patient to proceed with cycle #6 today as planned. I will see him back for follow-up visit in 4 weeks for evaluation with repeat CT scan of  the chest, abdomen pelvis as well as MRI of the brain for restaging of his disease. The patient was advised to call immediately if he has any other concerning symptoms in the interval. The total time spent in the appointment was 20 minutes. Disclaimer: This note was dictated with voice recognition software. Similar sounding words can inadvertently be transcribed and may be missed upon review. Eilleen Kempf, MD 04/22/21

## 2021-04-22 ENCOUNTER — Inpatient Hospital Stay: Payer: PPO

## 2021-04-22 ENCOUNTER — Inpatient Hospital Stay: Payer: PPO | Attending: Neurological Surgery | Admitting: Dietician

## 2021-04-22 ENCOUNTER — Inpatient Hospital Stay: Payer: PPO | Admitting: Physician Assistant

## 2021-04-22 ENCOUNTER — Other Ambulatory Visit: Payer: Self-pay

## 2021-04-22 VITALS — BP 117/56 | HR 63 | Temp 97.0°F | Resp 17 | Ht 69.0 in | Wt 161.1 lb

## 2021-04-22 DIAGNOSIS — Z5112 Encounter for antineoplastic immunotherapy: Secondary | ICD-10-CM | POA: Diagnosis not present

## 2021-04-22 DIAGNOSIS — Z7982 Long term (current) use of aspirin: Secondary | ICD-10-CM | POA: Insufficient documentation

## 2021-04-22 DIAGNOSIS — I959 Hypotension, unspecified: Secondary | ICD-10-CM | POA: Insufficient documentation

## 2021-04-22 DIAGNOSIS — F17210 Nicotine dependence, cigarettes, uncomplicated: Secondary | ICD-10-CM | POA: Diagnosis not present

## 2021-04-22 DIAGNOSIS — C7951 Secondary malignant neoplasm of bone: Secondary | ICD-10-CM | POA: Insufficient documentation

## 2021-04-22 DIAGNOSIS — Z95828 Presence of other vascular implants and grafts: Secondary | ICD-10-CM

## 2021-04-22 DIAGNOSIS — Z79899 Other long term (current) drug therapy: Secondary | ICD-10-CM | POA: Diagnosis not present

## 2021-04-22 DIAGNOSIS — C3490 Malignant neoplasm of unspecified part of unspecified bronchus or lung: Secondary | ICD-10-CM

## 2021-04-22 DIAGNOSIS — C3491 Malignant neoplasm of unspecified part of right bronchus or lung: Secondary | ICD-10-CM | POA: Insufficient documentation

## 2021-04-22 DIAGNOSIS — C7931 Secondary malignant neoplasm of brain: Secondary | ICD-10-CM | POA: Insufficient documentation

## 2021-04-22 LAB — CMP (CANCER CENTER ONLY)
ALT: <6 U/L (ref 0–44)
AST: 9 U/L — ABNORMAL LOW (ref 15–41)
Albumin: 3.5 g/dL (ref 3.5–5.0)
Alkaline Phosphatase: 84 U/L (ref 38–126)
Anion gap: 7 (ref 5–15)
BUN: 22 mg/dL (ref 8–23)
CO2: 22 mmol/L (ref 22–32)
Calcium: 8.9 mg/dL (ref 8.9–10.3)
Chloride: 111 mmol/L (ref 98–111)
Creatinine: 1.08 mg/dL (ref 0.61–1.24)
GFR, Estimated: >60 mL/min (ref >60)
Glucose, Bld: 106 mg/dL — ABNORMAL HIGH (ref 70–99)
Potassium: 5.1 mmol/L (ref 3.5–5.1)
Sodium: 140 mmol/L (ref 135–145)
Total Bilirubin: 0.4 mg/dL (ref 0.3–1.2)
Total Protein: 6.3 g/dL — ABNORMAL LOW (ref 6.5–8.1)

## 2021-04-22 LAB — CBC WITH DIFFERENTIAL (CANCER CENTER ONLY)
Abs Immature Granulocytes: 0.02 10*3/uL (ref 0.00–0.07)
Basophils Absolute: 0 10*3/uL (ref 0.0–0.1)
Basophils Relative: 1 %
Eosinophils Absolute: 0.2 10*3/uL (ref 0.0–0.5)
Eosinophils Relative: 3 %
HCT: 37.1 % — ABNORMAL LOW (ref 39.0–52.0)
Hemoglobin: 12 g/dL — ABNORMAL LOW (ref 13.0–17.0)
Immature Granulocytes: 0 %
Lymphocytes Relative: 3 %
Lymphs Abs: 0.1 10*3/uL — ABNORMAL LOW (ref 0.7–4.0)
MCH: 33.2 pg (ref 26.0–34.0)
MCHC: 32.3 g/dL (ref 30.0–36.0)
MCV: 102.8 fL — ABNORMAL HIGH (ref 80.0–100.0)
Monocytes Absolute: 0.5 10*3/uL (ref 0.1–1.0)
Monocytes Relative: 10 %
Neutro Abs: 4 10*3/uL (ref 1.7–7.7)
Neutrophils Relative %: 83 %
Platelet Count: 79 10*3/uL — ABNORMAL LOW (ref 150–400)
RBC: 3.61 MIL/uL — ABNORMAL LOW (ref 4.22–5.81)
RDW: 14.7 % (ref 11.5–15.5)
WBC Count: 4.8 10*3/uL (ref 4.0–10.5)
nRBC: 0 % (ref 0.0–0.2)

## 2021-04-22 LAB — TSH: TSH: 0.566 u[IU]/mL (ref 0.320–4.118)

## 2021-04-22 MED ORDER — SODIUM CHLORIDE 0.9% FLUSH
10.0000 mL | Freq: Once | INTRAVENOUS | Status: AC
  Administered 2021-04-22: 10 mL
  Filled 2021-04-22: qty 10

## 2021-04-22 MED ORDER — SODIUM CHLORIDE 0.9 % IV SOLN
Freq: Once | INTRAVENOUS | Status: AC
  Filled 2021-04-22: qty 250

## 2021-04-22 MED ORDER — SODIUM CHLORIDE 0.9 % IV SOLN
1500.0000 mg | Freq: Once | INTRAVENOUS | Status: AC
  Administered 2021-04-22: 1500 mg via INTRAVENOUS
  Filled 2021-04-22: qty 30

## 2021-04-22 MED ORDER — HEPARIN SOD (PORK) LOCK FLUSH 100 UNIT/ML IV SOLN
500.0000 [IU] | Freq: Once | INTRAVENOUS | Status: AC | PRN
  Administered 2021-04-22: 500 [IU]
  Filled 2021-04-22: qty 5

## 2021-04-22 MED ORDER — SODIUM CHLORIDE 0.9% FLUSH
10.0000 mL | INTRAVENOUS | Status: DC | PRN
  Administered 2021-04-22: 10 mL
  Filled 2021-04-22: qty 10

## 2021-04-22 NOTE — Progress Notes (Signed)
Nutrition Follow-up:  Patient with metastatic small cell lung cancer. He completed systemic chemotherapy with carboplatin. He is currently receiving maintenance immunotherapy with Imfinzi.   Met with patient in infusion. He reports his appetite has got "much better" after completing chemotherapy. Yesterday patient had 2 eggs, 2 pieces of bacon and fruit for breakfast, sliced rotisserie chicken sandwich for lunch, crab cakes, crab soup, and green beans for dinner. Patient is enjoying home made Bluebell ice cream milkshakes that he makes at home. Patient asking why he should not be eating soft eggs. RD discussed recommendations for well done eggs, meats, poultry to reduce risk of food bourne illness. Patient says he does not like firm eggs, states he has never gotten sick from eating soft eggs and will take his chances. He reports having more energy, looking forward to hitting golf balls soon and watching his grandson play in a baseball tournament in August.   Medications: reviewed  Labs: Glucose 106  Anthropometrics: Weight 161 lb today stable  6/28 - 160 lb 8 oz 6/7 - 160 lb 4.8 oz 5/19 - 165 lb 12.4 oz   NUTRITION DIAGNOSIS: Food and nutrition related knowledge deficit ongoing   INTERVENTION:  Continue eating high calorie, high protein foods for weight maintenance Encouraged activity as able Educated on increased risk of food bourne illness with undercooked eggs/meats/poultry Patient has contact information    MONITORING, EVALUATION, GOAL:  Weight trends, intake  NEXT VISIT: Tuesday August 23 in infusion

## 2021-04-22 NOTE — Progress Notes (Signed)
Ok to treat with plts of 76 per [name] Heilingoepter, PA.

## 2021-04-22 NOTE — Patient Instructions (Signed)
[facility]COL[facility]  Discharge Instructions: Thank you for choosing [facility] to provide your oncology and hematology care.   If you have a lab appointment with the [facility], please go directly to the [facility] and check in at the registration area.   Wear comfortable clothing and clothing appropriate for easy access to any Portacath or PICC line.   We strive to give you quality time with your provider. You may need to reschedule your appointment if you arrive late (15 or more minutes).  Arriving late affects you and other patients whose appointments are after yours.  Also, if you miss three or more appointments without notifying the office, you may be dismissed from the clinic at the provider's discretion.      For prescription refill requests, have your pharmacy contact our office and allow 72 hours for refills to be completed.    Today you received the following chemotherapy and/or immunotherapy agents imfinzi       To help prevent nausea and vomiting after your treatment, we encourage you to take your nausea medication as directed.  BELOW ARE SYMPTOMS THAT SHOULD BE REPORTED IMMEDIATELY: *FEVER GREATER THAN 100.4 F (38 C) OR HIGHER *CHILLS OR SWEATING *NAUSEA AND VOMITING THAT IS NOT CONTROLLED WITH YOUR NAUSEA MEDICATION *UNUSUAL SHORTNESS OF BREATH *UNUSUAL BRUISING OR BLEEDING *URINARY PROBLEMS (pain or burning when urinating, or frequent urination) *BOWEL PROBLEMS (unusual diarrhea, constipation, pain near the anus) TENDERNESS IN MOUTH AND THROAT WITH OR WITHOUT PRESENCE OF ULCERS (sore throat, sores in mouth, or a toothache) UNUSUAL RASH, SWELLING OR PAIN  UNUSUAL VAGINAL DISCHARGE OR ITCHING   Items with * indicate a potential emergency and should be followed up as soon as possible or go to the Emergency Department if any problems should occur.  Please show the CHEMOTHERAPY ALERT CARD or IMMUNOTHERAPY ALERT CARD at check-in to  the Emergency Department and triage nurse.  Should you have questions after your visit or need to cancel or reschedule your appointment, please contact [facility]  Dept: [phone]  and follow the prompts.  Office hours are 8:00 a.m. to 4:30 p.m. Monday - Friday. Please note that voicemails left after 4:00 p.m. may not be returned until the following business day.  We are closed weekends and major holidays. You have access to a nurse at all times for urgent questions. Please call the main number to the clinic Dept: [phone] and follow the prompts.   For any non-urgent questions, you may also contact your provider using MyChart. We now offer e-Visits for anyone 49 and older to request care online for non-urgent symptoms. For details visit [email or web address].   Also download the MyChart app! Go to the app store, search "MyChart", open the app, select [facility], and log in with your MyChart username and password.  Due to Covid, a mask is required upon entering the hospital/clinic. If you do not have a mask, one will be given to you upon arrival. For doctor visits, patients may have 1 support person aged 65 or older with them. For treatment visits, patients cannot have anyone with them due to current Covid guidelines and our immunocompromised population.

## 2021-05-09 ENCOUNTER — Telehealth: Payer: Self-pay | Admitting: Medical Oncology

## 2021-05-09 NOTE — Telephone Encounter (Signed)
Imaging questions- Pt  is not clear why he needs another CT C/A/P ( expected 08/19) when he had both  on 6/28 and MRI liver on 07/11.  I told him someone will get back with him on Monday .

## 2021-05-13 NOTE — Telephone Encounter (Signed)
I spoke with pt and advised as indicated. Pt expressed understanding of this information.

## 2021-05-20 ENCOUNTER — Encounter: Payer: Self-pay | Admitting: Internal Medicine

## 2021-05-20 ENCOUNTER — Inpatient Hospital Stay: Payer: PPO

## 2021-05-20 ENCOUNTER — Encounter: Payer: Self-pay | Admitting: *Deleted

## 2021-05-20 ENCOUNTER — Other Ambulatory Visit: Payer: Self-pay

## 2021-05-20 ENCOUNTER — Inpatient Hospital Stay: Payer: PPO | Attending: Neurological Surgery | Admitting: Dietician

## 2021-05-20 ENCOUNTER — Inpatient Hospital Stay: Payer: PPO | Admitting: Internal Medicine

## 2021-05-20 VITALS — BP 126/66 | HR 62 | Temp 97.6°F | Resp 18 | Ht 69.0 in | Wt 163.3 lb

## 2021-05-20 DIAGNOSIS — C3491 Malignant neoplasm of unspecified part of right bronchus or lung: Secondary | ICD-10-CM

## 2021-05-20 DIAGNOSIS — I959 Hypotension, unspecified: Secondary | ICD-10-CM | POA: Diagnosis not present

## 2021-05-20 DIAGNOSIS — Z95828 Presence of other vascular implants and grafts: Secondary | ICD-10-CM

## 2021-05-20 DIAGNOSIS — Z7982 Long term (current) use of aspirin: Secondary | ICD-10-CM | POA: Insufficient documentation

## 2021-05-20 DIAGNOSIS — C7971 Secondary malignant neoplasm of right adrenal gland: Secondary | ICD-10-CM | POA: Diagnosis not present

## 2021-05-20 DIAGNOSIS — Z5112 Encounter for antineoplastic immunotherapy: Secondary | ICD-10-CM | POA: Insufficient documentation

## 2021-05-20 DIAGNOSIS — C7951 Secondary malignant neoplasm of bone: Secondary | ICD-10-CM | POA: Insufficient documentation

## 2021-05-20 DIAGNOSIS — F17210 Nicotine dependence, cigarettes, uncomplicated: Secondary | ICD-10-CM | POA: Insufficient documentation

## 2021-05-20 DIAGNOSIS — Z79899 Other long term (current) drug therapy: Secondary | ICD-10-CM | POA: Insufficient documentation

## 2021-05-20 DIAGNOSIS — C7931 Secondary malignant neoplasm of brain: Secondary | ICD-10-CM | POA: Diagnosis not present

## 2021-05-20 DIAGNOSIS — C7972 Secondary malignant neoplasm of left adrenal gland: Secondary | ICD-10-CM

## 2021-05-20 LAB — CBC WITH DIFFERENTIAL (CANCER CENTER ONLY)
Abs Immature Granulocytes: 0.01 10*3/uL (ref 0.00–0.07)
Basophils Absolute: 0 10*3/uL (ref 0.0–0.1)
Basophils Relative: 1 %
Eosinophils Absolute: 0.1 10*3/uL (ref 0.0–0.5)
Eosinophils Relative: 2 %
HCT: 41.9 % (ref 39.0–52.0)
Hemoglobin: 13.4 g/dL (ref 13.0–17.0)
Immature Granulocytes: 0 %
Lymphocytes Relative: 5 %
Lymphs Abs: 0.2 10*3/uL — ABNORMAL LOW (ref 0.7–4.0)
MCH: 32.4 pg (ref 26.0–34.0)
MCHC: 32 g/dL (ref 30.0–36.0)
MCV: 101.2 fL — ABNORMAL HIGH (ref 80.0–100.0)
Monocytes Absolute: 0.5 10*3/uL (ref 0.1–1.0)
Monocytes Relative: 15 %
Neutro Abs: 2.9 10*3/uL (ref 1.7–7.7)
Neutrophils Relative %: 77 %
Platelet Count: 91 10*3/uL — ABNORMAL LOW (ref 150–400)
RBC: 4.14 MIL/uL — ABNORMAL LOW (ref 4.22–5.81)
RDW: 12.7 % (ref 11.5–15.5)
WBC Count: 3.7 10*3/uL — ABNORMAL LOW (ref 4.0–10.5)
nRBC: 0 % (ref 0.0–0.2)

## 2021-05-20 LAB — CMP (CANCER CENTER ONLY)
ALT: <6 U/L (ref 0–44)
AST: 9 U/L — ABNORMAL LOW (ref 15–41)
Albumin: 3.6 g/dL (ref 3.5–5.0)
Alkaline Phosphatase: 80 U/L (ref 38–126)
Anion gap: 7 (ref 5–15)
BUN: 19 mg/dL (ref 8–23)
CO2: 22 mmol/L (ref 22–32)
Calcium: 8.8 mg/dL — ABNORMAL LOW (ref 8.9–10.3)
Chloride: 111 mmol/L (ref 98–111)
Creatinine: 1.04 mg/dL (ref 0.61–1.24)
GFR, Estimated: >60 mL/min (ref >60)
Glucose, Bld: 90 mg/dL (ref 70–99)
Potassium: 4.8 mmol/L (ref 3.5–5.1)
Sodium: 140 mmol/L (ref 135–145)
Total Bilirubin: 0.3 mg/dL (ref 0.3–1.2)
Total Protein: 6.3 g/dL — ABNORMAL LOW (ref 6.5–8.1)

## 2021-05-20 LAB — TSH: TSH: 0.487 u[IU]/mL (ref 0.320–4.118)

## 2021-05-20 MED ORDER — SODIUM CHLORIDE 0.9 % IV SOLN: Freq: Once | INTRAVENOUS | Status: AC

## 2021-05-20 MED ORDER — SODIUM CHLORIDE 0.9% FLUSH
10.0000 mL | INTRAVENOUS | Status: DC | PRN
  Administered 2021-05-20: 10 mL

## 2021-05-20 MED ORDER — HEPARIN SOD (PORK) LOCK FLUSH 100 UNIT/ML IV SOLN
500.0000 [IU] | Freq: Once | INTRAVENOUS | Status: AC | PRN
  Administered 2021-05-20: 500 [IU]

## 2021-05-20 MED ORDER — SODIUM CHLORIDE 0.9 % IV SOLN
1500.0000 mg | Freq: Once | INTRAVENOUS | Status: AC
  Administered 2021-05-20: 1500 mg via INTRAVENOUS
  Filled 2021-05-20: qty 30

## 2021-05-20 MED ORDER — SODIUM CHLORIDE 0.9% FLUSH
10.0000 mL | Freq: Once | INTRAVENOUS | Status: AC
  Administered 2021-05-20: 10 mL

## 2021-05-20 NOTE — Patient Instructions (Signed)
[facility] ONCOLOG[facility]   Discharge Instructions: Thank you for choosing [facility] to provide your oncology and hematology care.   If you have a lab appointment with the [facility], please go directly to the [facility] and check in at the registration area.   Wear comfortable clothing and clothing appropriate for easy access to any Portacath or PICC line.   We strive to give you quality time with your provider. You may need to reschedule your appointment if you arrive late (15 or more minutes).  Arriving late affects you and other patients whose appointments are after yours.  Also, if you miss three or more appointments without notifying the office, you may be dismissed from the clinic at the provider's discretion.      For prescription refill requests, have your pharmacy contact our office and allow 72 hours for refills to be completed.    Today you received the following chemotherapy and/or immunotherapy agents: durvalumab.      To help prevent nausea and vomiting after your treatment, we encourage you to take your nausea medication as directed.  BELOW ARE SYMPTOMS THAT SHOULD BE REPORTED IMMEDIATELY: *FEVER GREATER THAN 100.4 F (38 C) OR HIGHER *CHILLS OR SWEATING *NAUSEA AND VOMITING THAT IS NOT CONTROLLED WITH YOUR NAUSEA MEDICATION *UNUSUAL SHORTNESS OF BREATH *UNUSUAL BRUISING OR BLEEDING *URINARY PROBLEMS (pain or burning when urinating, or frequent urination) *BOWEL PROBLEMS (unusual diarrhea, constipation, pain near the anus) TENDERNESS IN MOUTH AND THROAT WITH OR WITHOUT PRESENCE OF ULCERS (sore throat, sores in mouth, or a toothache) UNUSUAL RASH, SWELLING OR PAIN  UNUSUAL VAGINAL DISCHARGE OR ITCHING   Items with * indicate a potential emergency and should be followed up as soon as possible or go to the Emergency Department if any problems should occur.  Please show the CHEMOTHERAPY ALERT CARD or IMMUNOTHERAPY ALERT CARD at check-in  to the Emergency Department and triage nurse.  Should you have questions after your visit or need to cancel or reschedule your appointment, please contact [facility]  Dept: [phone]  and follow the prompts.  Office hours are 8:00 a.m. to 4:30 p.m. Monday - Friday. Please note that voicemails left after 4:00 p.m. may not be returned until the following business day.  We are closed weekends and major holidays. You have access to a nurse at all times for urgent questions. Please call the main number to the clinic Dept: [phone] and follow the prompts.   For any non-urgent questions, you may also contact your provider using MyChart. We now offer e-Visits for anyone 26 and older to request care online for non-urgent symptoms. For details visit [email or web address].   Also download the MyChart app! Go to the app store, search "MyChart", open the app, select [facility], and log in with your MyChart username and password.  Due to Covid, a mask is required upon entering the hospital/clinic. If you do not have a mask, one will be given to you upon arrival. For doctor visits, patients may have 1 support person aged 37 or older with them. For treatment visits, patients cannot have anyone with them due to current Covid guidelines and our immunocompromised population.

## 2021-05-20 NOTE — Progress Notes (Signed)
I spoke to [name] today during clinic.  During his visit with Dr. Julien Nordmann he showed [place] his arm was bleeding.  Dr. Julien Nordmann and myself helped [name] clean and dress his arm.  Noted skin tear on left forearm.  It was nice seeing [name] and talk to him about his grandson.

## 2021-05-20 NOTE — Progress Notes (Signed)
Nutrition Follow-up:  Patient receiving maintenance immunotherapy with Imfinzi for metastatic small cell lung cancer.   Met with patient in infusion. He is eating a bag of pretzels at visit. Patient reports his appetite is good and eating well. Patient denies nutrition impact symptoms, says he feels fatigued only if he over does it. He went to 3 of 4 of his grandson's baseball games over the weekend. Yesterday patient ate banana bread, bacon, cantaloupe for breakfast, chicken salad on whole wheat for lunch, Porterhouse burger and salad for dinner. He reports having a cinnamon roll, watermelon bites, and cantaloupe this morning before treatment.      Medications: reviewed  Labs: reviewed  Anthropometrics: Weight 163 lb 4.8 oz today increased from 161 lb on 7/26 and 160 lb 8 oz on 6/28  NUTRITION DIAGNOSIS: Food and nutrition knowledge related deficit improved   INTERVENTION:  Continue eating high calorie, high protein foods for weight maintenance Encouraged activity as able Patient has contact information    MONITORING, EVALUATION, GOAL: weight trends, intake   NEXT VISIT: Tuesday September 20 in infusion

## 2021-05-20 NOTE — Progress Notes (Signed)
[facility] [phone]   [phone]  OFFICE PROGRESS NOTE  [name], MD [address] [place] [address]  DIAGNOSIS: extensive stage (T2 a, N2, M1c) small cell lung cancer presented with right hilar mass in addition to mediastinal lymphadenopathy as well as metastatic disease to the bone with complete replacement of the L5 status post laminectomy with resection of epidural tumor as well as metastatic disease to the brain, bones and bilateral adrenal glands diagnosed in January 2022.   PRIOR THERAPY: Status post palliative radiotherapy to the resected area at L5 under the care of Dr. Lisbeth Renshaw.  CURRENT THERAPY: Systemic chemotherapy with carboplatin for AUC of 5 from day 1, etoposide 100 mg/M2 on days 1, 2 and 3 with Cosela 240 mg/M2 before chemotherapy in addition to Imfinzi 1500 mg IV every 3 weeks with day one of the chemotherapy.  First dose December 24, 2020.  Status post 6 cycles.  Starting from cycle #5 the patient will be on maintenance treatment with Imfinzi every 4 weeks.  INTERVAL HISTORY: Gregory Hodges 80 y.o. male returns to the clinic today for follow-up visit.  The patient is feeling fine today with no concerning complaints except for mild fatigue.  He has some bruises and ecchymosis in his upper extremities.  He denied having any current chest pain, shortness of breath, cough or hemoptysis.  He denied having any fever or chills.  He has no nausea, vomiting, diarrhea or constipation.  He has no headache or visual changes.  He was supposed to have repeat CT scan of the chest, abdomen and pelvis performed before this visit but unfortunately it is scheduled to be done next week.  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  MEDICATIONS:  Current Outpatient Medications  Medication Sig Dispense Refill   acetaminophen (TYLENOL) 500 MG tablet Take 500 mg by mouth every 6 (six) hours as needed for moderate pain.     aspirin 81 MG tablet Take 81 mg by mouth daily.     atenolol (TENORMIN) 25 MG tablet Take 25 mg by mouth 2 (two) times daily.     calcium carbonate (TUMS - DOSED IN MG ELEMENTAL CALCIUM) 500 MG chewable tablet Chew 2 tablets by mouth daily as needed for indigestion or heartburn.     diphenhydrAMINE (BENADRYL) 25 MG tablet Take 50 mg by mouth daily as needed for allergies.     ferrous sulfate 325 (65 FE) MG tablet Take 325 mg by mouth every Monday, Wednesday, and Friday.     nitroGLYCERIN (NITROSTAT) 0.4 MG SL tablet Place 0.4 mg under the tongue every 5 (five) minutes as needed for chest pain.     omeprazole (PRILOSEC) 10  MG capsule Take 10 mg by mouth daily.     oxymetazoline (AFRIN) 0.05 % nasal spray Place 1 spray into both nostrils 2 (two) times daily as needed for congestion.     prochlorperazine (COMPAZINE) 10 MG tablet Take 1 tablet (10 mg total) by mouth every 6 (six) hours as needed for nausea or vomiting. 30 tablet 0   rosuvastatin (CRESTOR) 10 MG tablet Take 10 mg by mouth daily.     sulfamethoxazole-trimethoprim (BACTRIM DS) 800-160 MG per tablet Take 1 tablet by mouth 2 (two) times daily. 800-160 mg 60 tablet 11   lidocaine-prilocaine (EMLA) cream Apply to the Port-A-Cath site  30-60 minutes before treatment (Patient not taking: Reported on 05/20/2021) 30 g 0   No current facility-administered medications for this visit.    SURGICAL HISTORY:  Past Surgical History:  Procedure Laterality Date   ABDOMINAL AORTIC ANEURYSM REPAIR  03/03/2010   AXILLARY-FEMORAL BYPASS GRAFT  07/22/2010   bilateral   bowel     herniated bowel   CARDIAC CATHETERIZATION     CORONARY STENT PLACEMENT     CYSTOSCOPY     IR IMAGING GUIDED PORT INSERTION  12/27/2020   LAMINECTOMY N/A 10/22/2020   Procedure: Lumbar five Open Laminectomy for tumor resection;  Surgeon: Judith Part, MD;  Location: [facility];  Service: Neurosurgery;  Laterality: N/A;   MOHS SURGERY     several     REVIEW OF SYSTEMS:  A comprehensive review of systems was negative except for: Constitutional: positive for fatigue Musculoskeletal: positive for muscle weakness   PHYSICAL EXAMINATION: General appearance: alert, cooperative, fatigued, and no distress Head: Normocephalic, without obvious abnormality, atraumatic Neck: no adenopathy, no JVD, supple, symmetrical, trachea midline, and thyroid not enlarged, symmetric, no tenderness/mass/nodules Lymph nodes: Cervical, supraclavicular, and axillary nodes normal. Resp: clear to auscultation bilaterally Back: symmetric, no curvature. ROM normal. No CVA tenderness. Cardio: regular rate and rhythm, S1, S2 normal, no murmur, click, rub or gallop GI: soft, non-tender; bowel sounds normal; no masses,  no organomegaly Extremities: extremities normal, atraumatic, no cyanosis or edema  ECOG PERFORMANCE STATUS: 1 - Symptomatic but completely ambulatory  Blood pressure 126/66, pulse 62, temperature 97.6 F (36.4 C), temperature source Tympanic, resp. rate 18, height 5' 9"  (1.753 m), weight 163 lb 4.8 oz (74.1 kg), SpO2 98 %.  LABORATORY DATA: Lab Results  Component Value Date   WBC 3.7 (L) 05/20/2021   HGB 13.4 05/20/2021   HCT 41.9 05/20/2021   MCV 101.2 (H) 05/20/2021    PLT 91 (L) 05/20/2021      Chemistry      Component Value Date/Time   NA 140 04/22/2021 0854   K 5.1 04/22/2021 0854   CL 111 04/22/2021 0854   CO2 22 04/22/2021 0854   BUN 22 04/22/2021 0854   CREATININE 1.08 04/22/2021 0854      Component Value Date/Time   CALCIUM 8.9 04/22/2021 0854   ALKPHOS 84 04/22/2021 0854   AST 9 (L) 04/22/2021 0854   ALT <6 04/22/2021 0854   BILITOT 0.4 04/22/2021 0854       RADIOGRAPHIC STUDIES: No results found.   ASSESSMENT AND PLAN: This is a very pleasant 80  years old white male recently diagnosed with extensive stage (T2 a, N2, M1 C) small cell lung cancer presented with right upper lobe lesion with extensive mediastinal and right hilar adenopathy in addition to extensive bone metastasis as well as metastatic disease to the adrenal gland bilaterally and multiple brain lesions diagnosed in  February 2022 status post resection of the epidural tumor at the L5 as well as palliative radiotherapy to the lesion after resection. The patient is currently undergoing systemic chemotherapy with carboplatin for AUC of 5 on day 1, etoposide 100 Mg/M2 on days 1, 2 and 3 with Cosela on the days of the chemotherapy as well as Imfinzi every 3 weeks.  Status post 6 cycles.  Starting from cycle #5 the patient is on maintenance treatment with Imfinzi 1500 Mg IV every 4 weeks. The patient continues to tolerate this treatment fairly well with no concerning adverse effect except for mild fatigue. I recommended for him to proceed with cycle #7 today as planned. He will have restaging scan of the chest, abdomen pelvis performed next week.  It was supposed to be done before this visit. I will see him back for follow-up visit in 4 weeks for evaluation before starting cycle #8 if no evidence for disease progression on the upcoming scan. The patient was advised to call immediately if he has any other concerning symptoms in the interval. The patient voices understanding of  current disease status and treatment options and is in agreement with the current care plan.  All questions were answered. The patient knows to call the clinic with any problems, questions or concerns. We can certainly see the patient much sooner if necessary.   Disclaimer: This note was dictated with voice recognition software. Similar sounding words can inadvertently be transcribed and may not be corrected upon review.

## 2021-05-20 NOTE — Progress Notes (Signed)
Per Dr. Julien Nordmann, ok to treat with low platelets.

## 2021-05-21 ENCOUNTER — Other Ambulatory Visit (HOSPITAL_COMMUNITY): Payer: PPO

## 2021-05-21 ENCOUNTER — Ambulatory Visit (HOSPITAL_COMMUNITY): Payer: PPO

## 2021-05-26 ENCOUNTER — Ambulatory Visit (HOSPITAL_COMMUNITY)
Admission: RE | Admit: 2021-05-26 | Discharge: 2021-05-26 | Disposition: A | Discharge status: Home / Self Care | Payer: PPO | Source: Ambulatory Visit | Attending: Physician Assistant | Admitting: Physician Assistant

## 2021-05-26 ENCOUNTER — Other Ambulatory Visit: Payer: Self-pay

## 2021-05-26 DIAGNOSIS — C3490 Malignant neoplasm of unspecified part of unspecified bronchus or lung: Secondary | ICD-10-CM | POA: Diagnosis not present

## 2021-05-26 DIAGNOSIS — J439 Emphysema, unspecified: Secondary | ICD-10-CM | POA: Diagnosis not present

## 2021-05-26 DIAGNOSIS — N200 Calculus of kidney: Secondary | ICD-10-CM | POA: Diagnosis not present

## 2021-05-26 DIAGNOSIS — R911 Solitary pulmonary nodule: Secondary | ICD-10-CM | POA: Diagnosis not present

## 2021-05-26 DIAGNOSIS — C3491 Malignant neoplasm of unspecified part of right bronchus or lung: Secondary | ICD-10-CM | POA: Diagnosis not present

## 2021-05-26 DIAGNOSIS — I700 Atherosclerosis of aorta: Secondary | ICD-10-CM | POA: Diagnosis not present

## 2021-05-26 DIAGNOSIS — K769 Liver disease, unspecified: Secondary | ICD-10-CM | POA: Diagnosis not present

## 2021-05-26 IMAGING — MR MR HEAD WO/W CM
14 series · 48 of 48 positions shown · IV contrast (gadavist)
Comparison: [DATE]

CLINICAL DATA: Staging of small cell lung carcinoma

EXAM:
MRI HEAD WITHOUT AND WITH CONTRAST
TECHNIQUE: Multiplanar, multiecho pulse sequences of the brain and surrounding
structures were obtained without and with intravenous contrast.
CONTRAST:  8mL GADAVIST GADOBUTROL 1 MMOL/ML IV SOLN

[Series 5: DWI · axial · 3.0mm · 1.36mm/px · z∈[-44,+126]mm · 6 of 115 slices shown (1 of 2)]
[im 1/115]
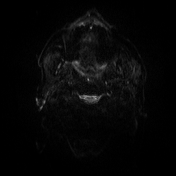
[im 23/115]
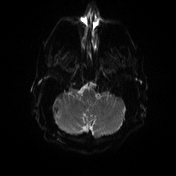
[im 46/115]
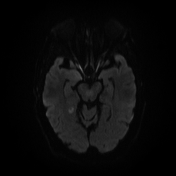
[im 69/115]
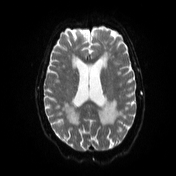
[im 92/115]
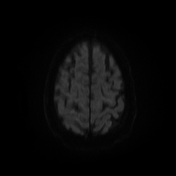
[im 115/115]
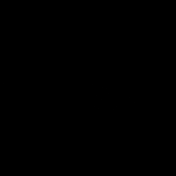

[Series 6: DWI · axial · 3.0mm · 1.36mm/px · z∈[-44,+123]mm · 3 of 57 slices shown (2 of 2)]
[im 1/57]
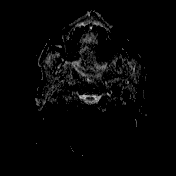
[im 29/57]
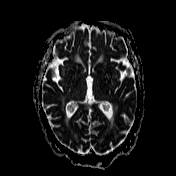
[im 57/57]
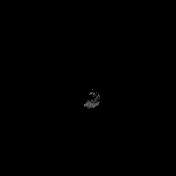

[Series 7: T1 · sagittal · 5.0mm · 0.75mm/px · 1 of 26 slices shown (1 of 2)]
[im 1/26]
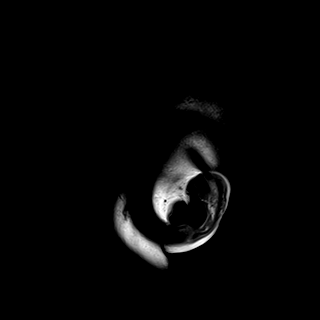

[Series 8: T2 · axial · 5.0mm · 0.62mm/px · 1 of 27 slices shown]
[im 1/27]
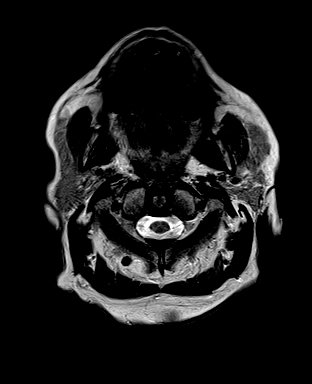

[Series 9: swi_images · axial · 3.0mm · 0.75mm/px · z∈[-47,+118]mm · 3 of 56 slices shown]
[im 1/56]
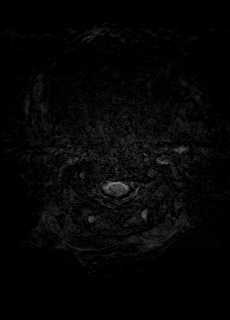
[im 28/56]
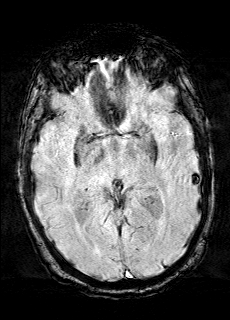
[im 56/56]
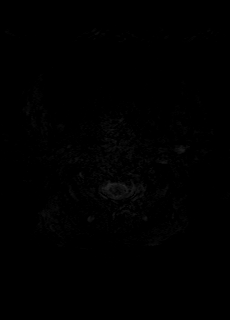

[Series 11: FLAIR · axial · 3.0mm · 0.75mm/px · z∈[-48,+120]mm · 3 of 57 slices shown (1 of 2)]
[im 1/57]
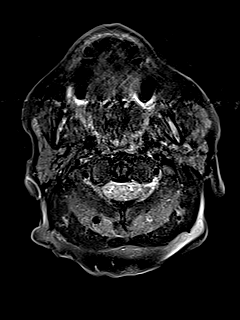
[im 29/57]
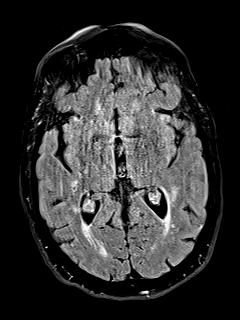
[im 57/57]
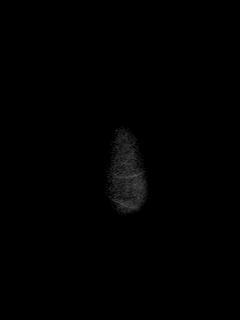

[Series 12: T1 · axial · 1.0mm · 0.94mm/px · z∈[-43,+115]mm · 9 of 160 slices shown (2 of 2)]
[im 1/160]
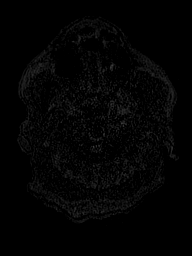
[im 20/160]
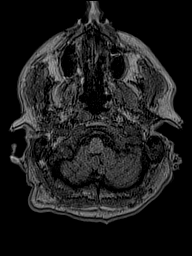
[im 40/160]
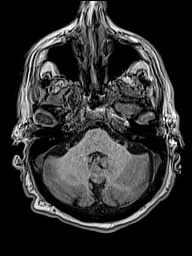
[im 60/160]
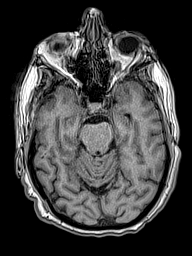
[im 80/160]
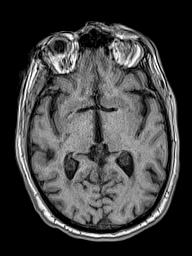
[im 100/160]
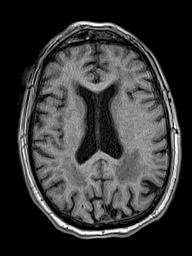
[im 120/160]
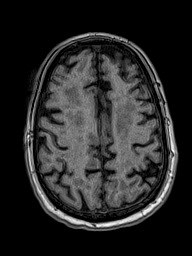
[im 140/160]
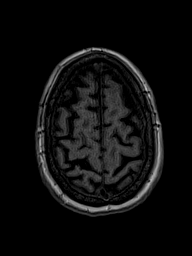
[im 160/160]
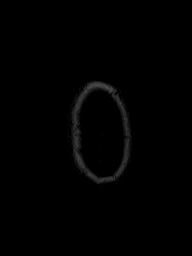

[Series 13: cor dwi_tracew · coronal · 5.0mm · 1.53mm/px · 3 of 60 slices shown]
[im 1/60]
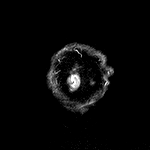
[im 30/60]
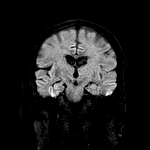
[im 60/60]
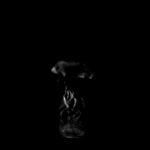

[Series 14: cor dwi_adc · coronal · 5.0mm · 1.53mm/px · 2 of 30 slices shown]
[im 1/30]
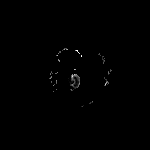
[im 30/30]
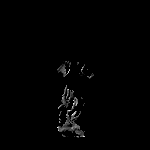

[Series 15: FLAIR · axial · 3.0mm · 0.75mm/px · z∈[-48,+120]mm · 3 of 57 slices shown (2 of 2)]
[im 1/57]
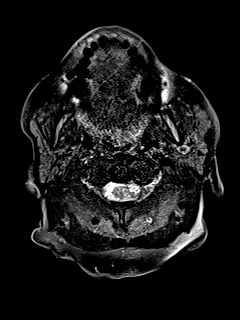
[im 29/57]
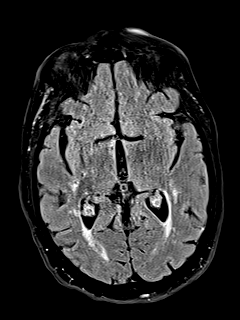
[im 57/57]
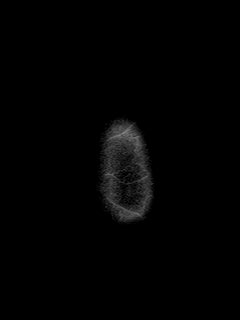

[Series 16: T2 post-contrast · coronal · 5.0mm · 0.57mm/px · 2 of 30 slices shown]
[im 1/30]
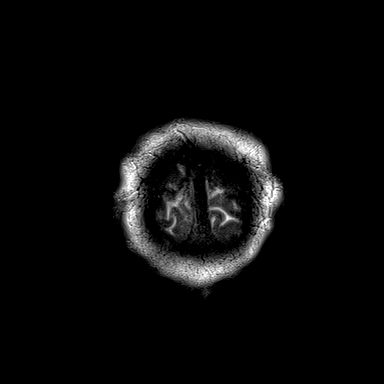
[im 30/30]
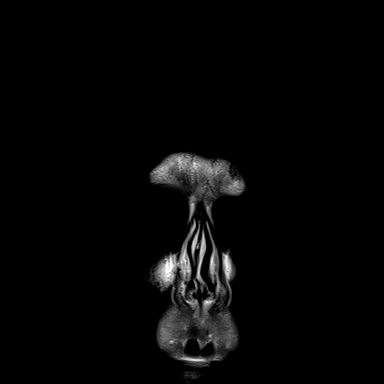

[Series 17: T1 post-contrast · axial · 1.0mm · 0.94mm/px · z∈[-43,+116]mm · 9 of 160 slices shown (1 of 3)]
[im 1/160]
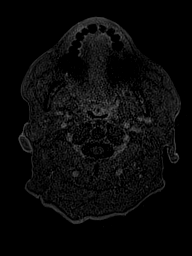
[im 20/160]
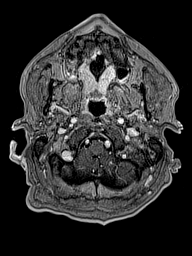
[im 40/160]
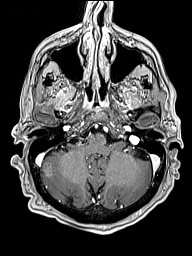
[im 60/160]
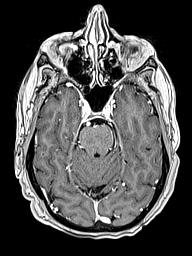
[im 80/160]
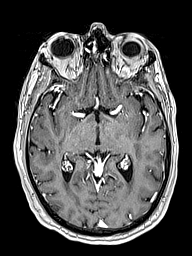
[im 100/160]
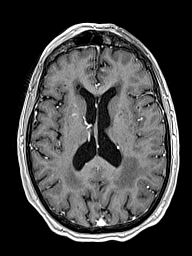
[im 120/160]
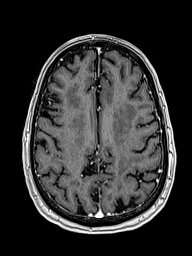
[im 140/160]
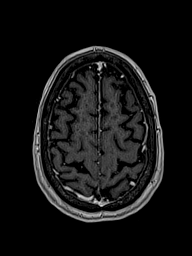
[im 160/160]
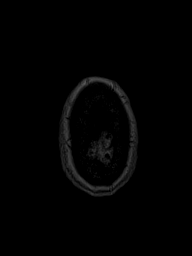

[Series 18: T1 post-contrast · coronal · 5.0mm · 0.43mm/px · 2 of 30 slices shown (2 of 3)]
[im 1/30]
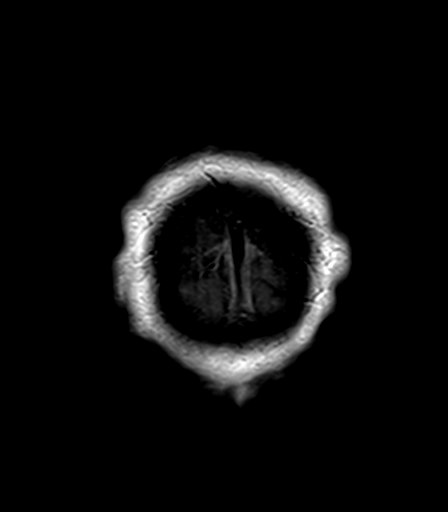
[im 30/30]
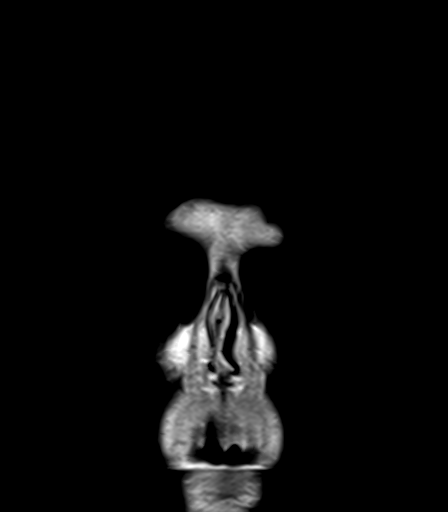

[Series 19: T1 post-contrast · sagittal · 5.0mm · 0.75mm/px · 1 of 26 slices shown (3 of 3)]
[im 1/26]
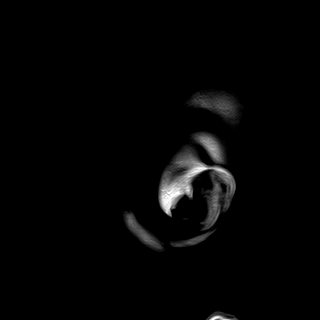

[48 of 48 positions shown; findings below may reference images not displayed]

FINDINGS: Brain: No acute infarct, mass effect or extra-axial collection.
Chronic blood products in the right cerebellum and left temporal
lobe. Hyperintense T2-weighted signal is moderately widespread
throughout the white matter. Generalized volume loss without a clear
lobar predilection. There are multiple contrast-enhancing lesions:

1. 1.5 cm right cerebellum, image 37, increased in size
2. 9 mm medial right temporal lobe, image 65, new

Vascular: Major flow voids are preserved.

Skull and upper cervical spine: Normal calvarium and skull base.
Visualized upper cervical spine and soft tissues are normal.

Sinuses/Orbits:No paranasal sinus fluid levels or advanced mucosal
thickening. No mastoid or middle ear effusion. Normal orbits.
IMPRESSION: 1. Larger right cerebellar and new medial right temporal lobe
metastases.
2. No acute intracranial abnormality.
3. Chronic microvascular ischemia and volume loss.

## 2021-05-26 IMAGING — CT CT ABD-PELV W/ CM
2 of 5 series · 13 of 36 positions shown, 16 images · IV contrast (APPLIED)
Comparison: CT chest abdomen and pelvis dated [DATE]

CLINICAL DATA: Extensive stage small-cell lung cancer.

EXAM:
CT CHEST, ABDOMEN, AND PELVIS WITH CONTRAST
TECHNIQUE: Multidetector CT imaging of the chest, abdomen and pelvis was
performed following the standard protocol during bolus
administration of intravenous contrast.
CONTRAST:  80mL OMNIPAQUE IOHEXOL 350 MG/ML SOLN

[Series 2: cap with · axial · 0.92mm/px · z∈[-505,+40]mm · 10 of 135 slices shown, 13 images]
[im 13/135  mediastinal]
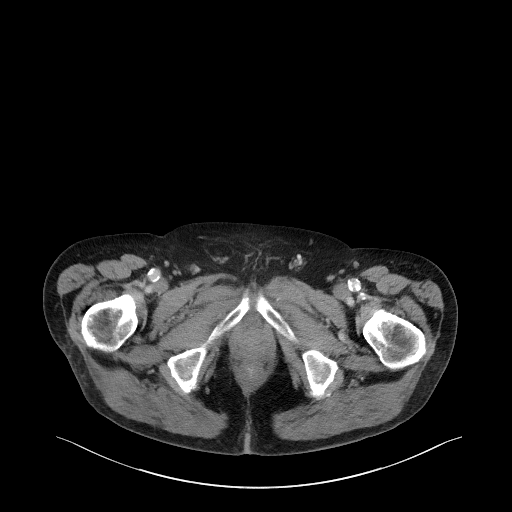
[im 13/135  lung]
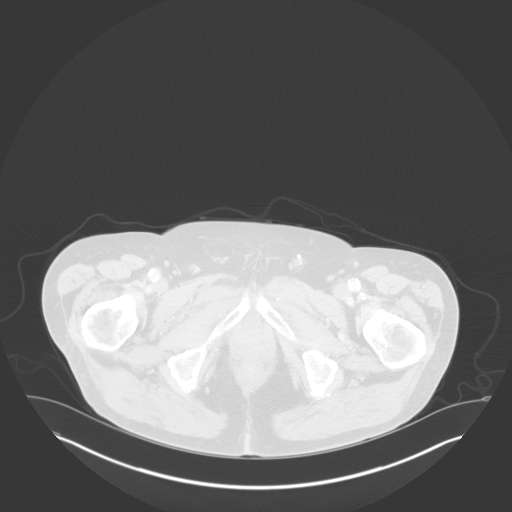
[im 25/135  lung]
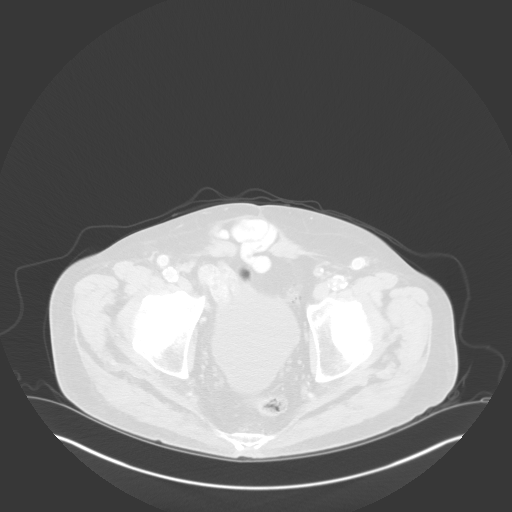
[im 37/135  lung]
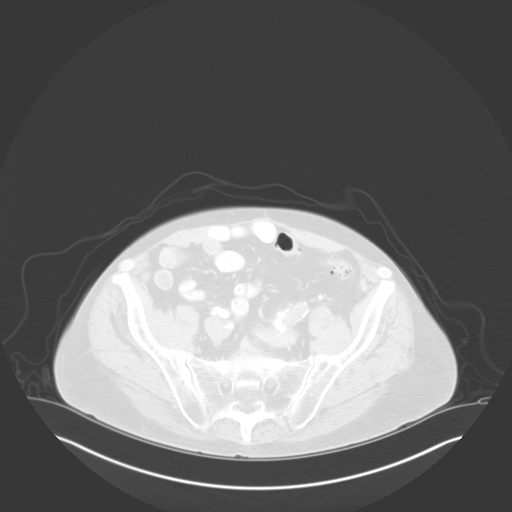
[im 49/135  lung]
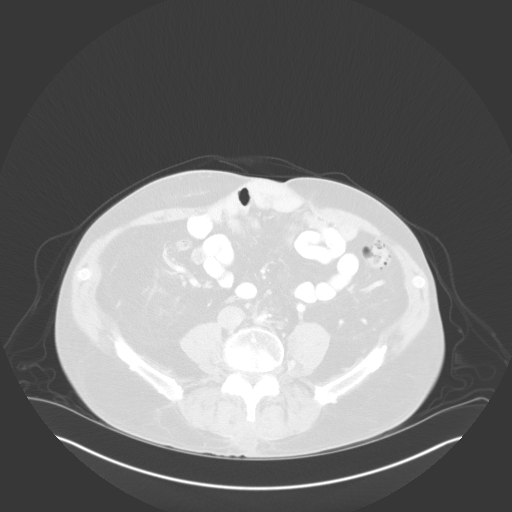
[im 61/135  mediastinal]
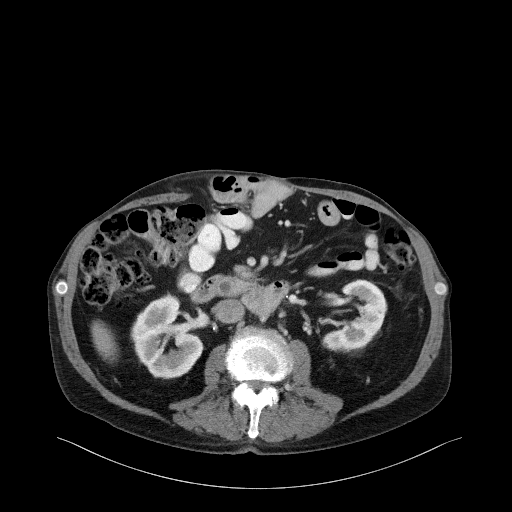
[im 61/135  lung]
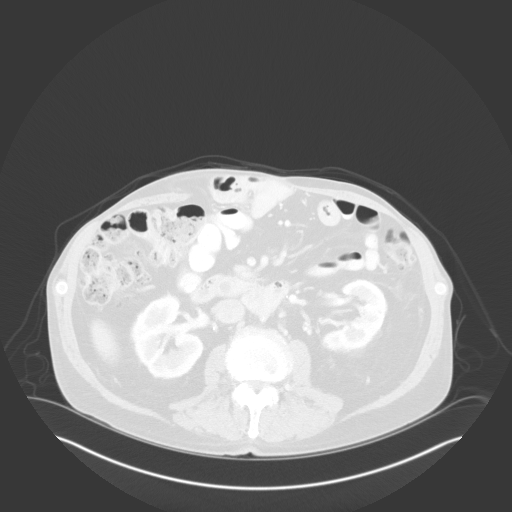
[im 74/135  lung]
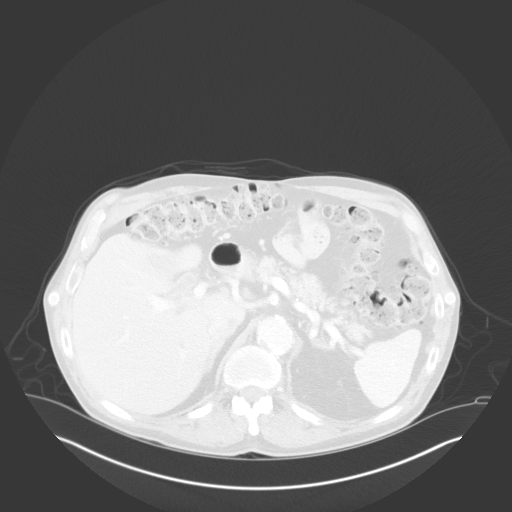
[im 86/135  lung]
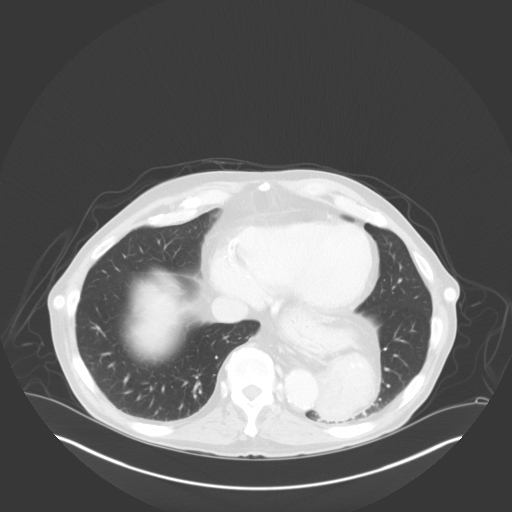
[im 98/135  lung]
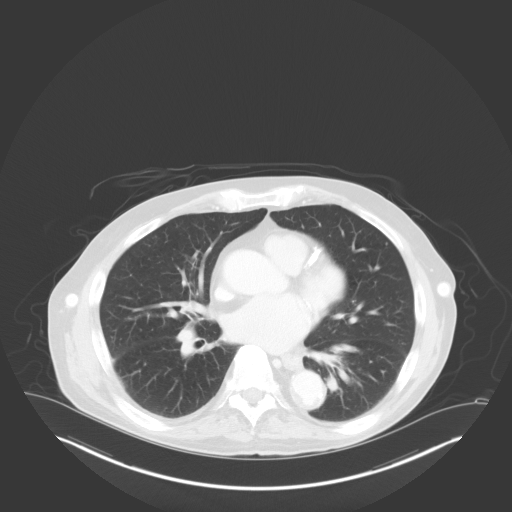
[im 110/135  mediastinal]
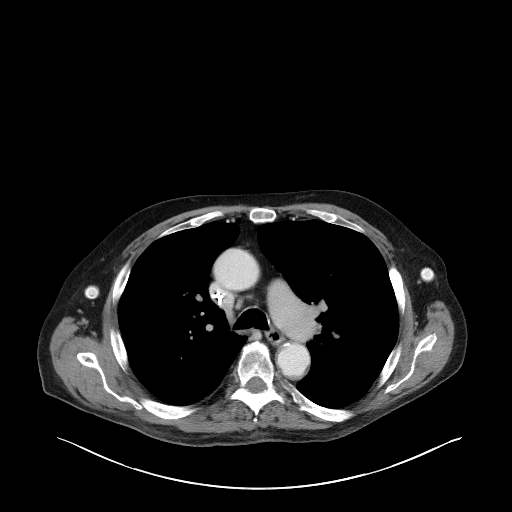
[im 110/135  lung]
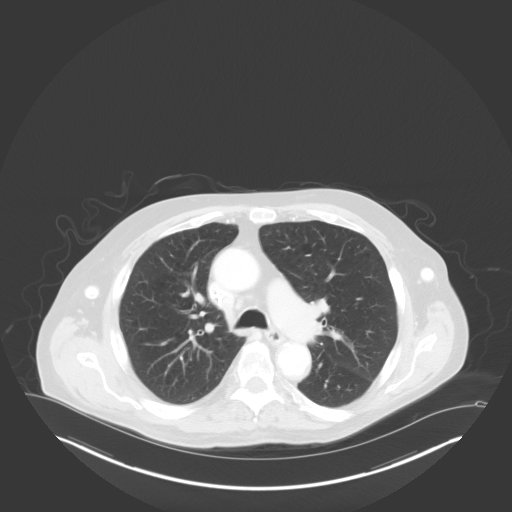
[im 122/135  lung]
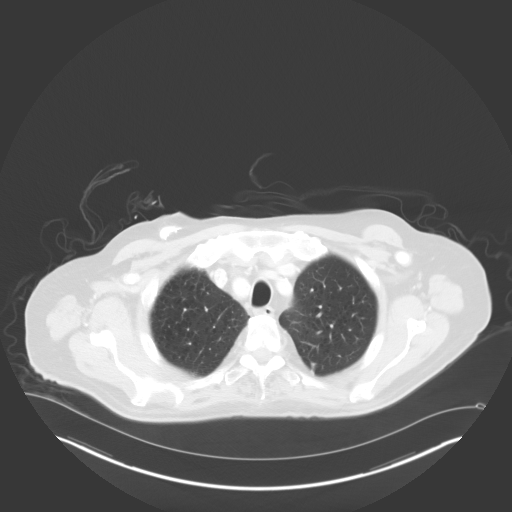

[Series 6: coronals · coronal · 0.82mm/px · 3 of 137 slices shown]
[im 28/137  lung]
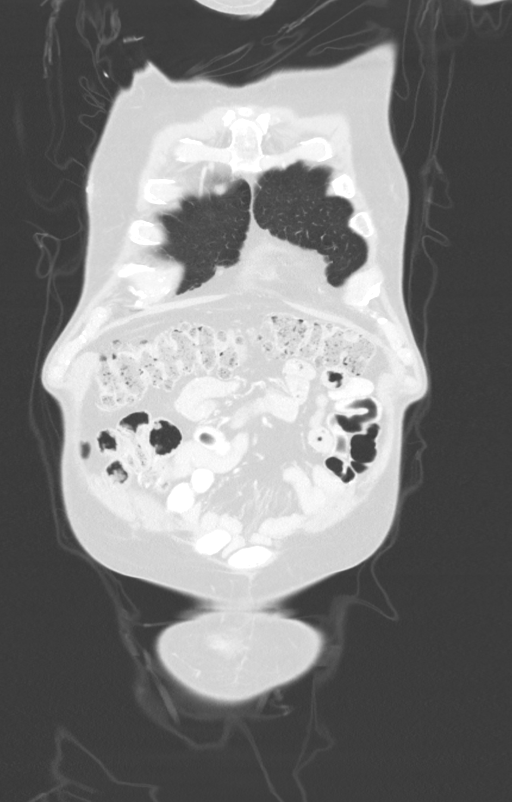
[im 55/137  lung]
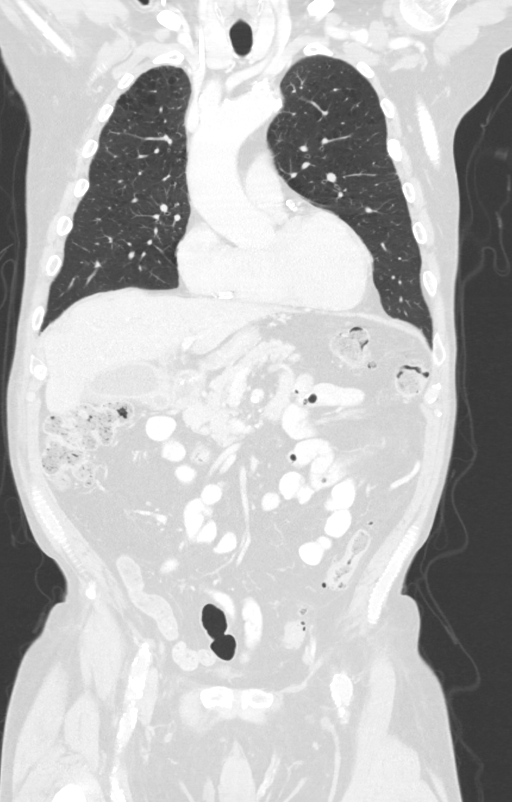
[im 82/137  lung]
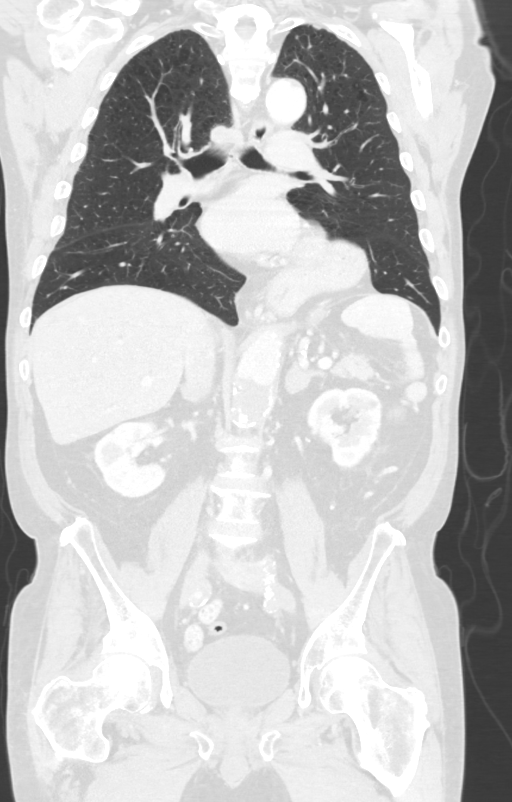

[13 of 36 positions shown; findings below may reference images not displayed]

FINDINGS: CT CHEST FINDINGS

Cardiovascular: Cardiomegaly. Right chest wall port with tip in the
lower SVC. No pericardial effusion. Three-vessel calcifications
effusion atherosclerotic disease of the thoracic aorta with
ulcerated soft plaque of the descending thoracic aorta unchanged
compared to exam no suspicious filling defects of the central
pulmonary arteries.

Mediastinum/Nodes: No pathologically enlarged lymph nodes seen in
chest. Large hiatal hernia. Thyroid is unremarkable.

Lungs/Pleura: Central airways are patent. No new or enlarging
pulmonary nodules.

Musculoskeletal: Unchanged lytic and sclerotic lesion of the right
posterior rib with adjacent fracture. Unchanged lytic and sclerotic
lesion the lateral left rib.

CT ABDOMEN PELVIS FINDINGS

Hepatobiliary: Low-attenuation lesions of the right hepatic lobe
which were new on prior exam are decreased in size. Reference lesion
of the right lobe located on series 2, image 59 measures 5 mm,
previously 11 mm. The previously seen scattered small liver lesions
are unchanged. Atrophy of the left hepatic lobe with mild
intrahepatic biliary ductal dilation. Dilated common bile duct,
unchanged compared to prior.

Pancreas: Unremarkable. No pancreatic ductal dilatation or
surrounding inflammatory changes.

Spleen: Normal in size without focal abnormality.

Adrenals/Urinary Tract: Increased size of left adrenal nodule,
measuring 2.9 x 1.5 cm previously 2.1 x 1.1 cm. Increased size of
right adrenal nodule, measuring up to 1.5 cm previously 0.8 cm.
Kidneys enhance symmetrically with no evidence of hydronephrosis.
Bilateral nonobstructing renal stones. Simple cyst of the left
kidney. Additional bilateral low-density renal lesions are seen
which are too small to completely characterize.

Stomach/Bowel: Stomach is within normal limits. Appendix appears
normal. Diverticula of the descending and sigmoid colon. No evidence
of bowel wall thickening, distention, or inflammatory changes.

Vascular/Lymphatic: Chronic occlusion of the abdominal aorta below
the level of the renal arteries. Status post bilateral axillary to
femoral bypass grafting.

Reproductive: Calcifications of the prostate.

Other: Moderate fat containing right inguinal hernia. Large ventral
abdominal wall hernia containing nondilated loops bowel.

Musculoskeletal: Unchanged sclerotic lesion of the anterior right
acetabulum. Unchanged sclerosis and height loss of the L5 vertebral
body.
IMPRESSION: Bilateral adrenal nodules are increased in size when compared with
most recent prior CT, concerning for progressive disease.

Low-attenuation lesions of the right hepatic lobe which were new on
prior exam are decreased in size.

Unchanged osseous metastatic disease.

Aortic Atherosclerosis ([45]-[45]) and Emphysema ([45]-[45]).

## 2021-05-26 IMAGING — CT CT CHEST W/ CM
2 of 5 series · 13 of 36 positions shown, 16 images · IV contrast (APPLIED)
Comparison: CT chest abdomen and pelvis dated [DATE]

CLINICAL DATA: Extensive stage small-cell lung cancer.

EXAM:
CT CHEST, ABDOMEN, AND PELVIS WITH CONTRAST
TECHNIQUE: Multidetector CT imaging of the chest, abdomen and pelvis was
performed following the standard protocol during bolus
administration of intravenous contrast.
CONTRAST:  80mL OMNIPAQUE IOHEXOL 350 MG/ML SOLN

[Series 504: cap with · axial · 0.92mm/px · z∈[-505,+40]mm · 10 of 135 slices shown, 13 images]
[im 13/135  mediastinal]
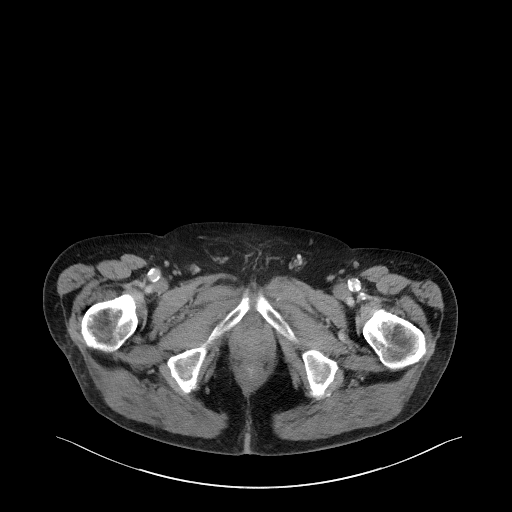
[im 13/135  lung]
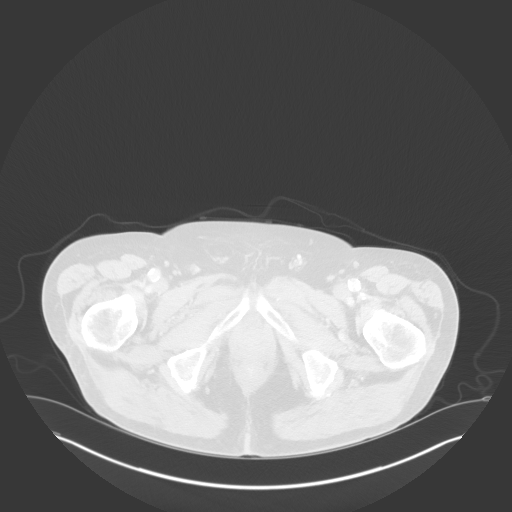
[im 25/135  lung]
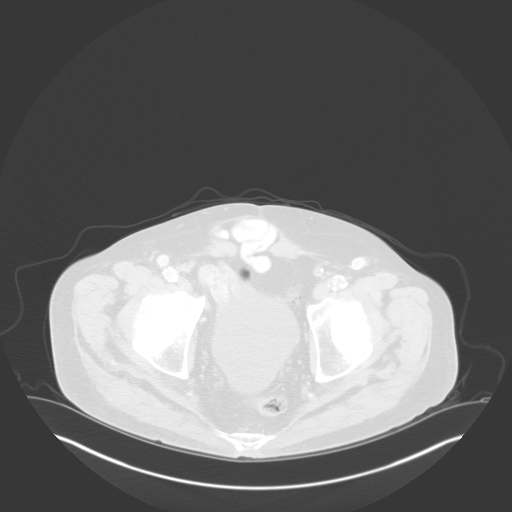
[im 37/135  lung]
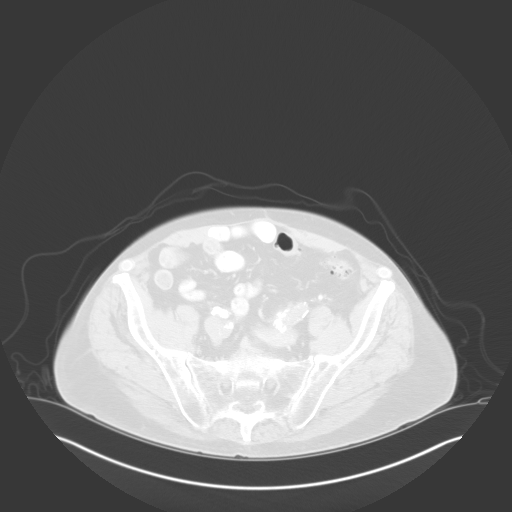
[im 49/135  lung]
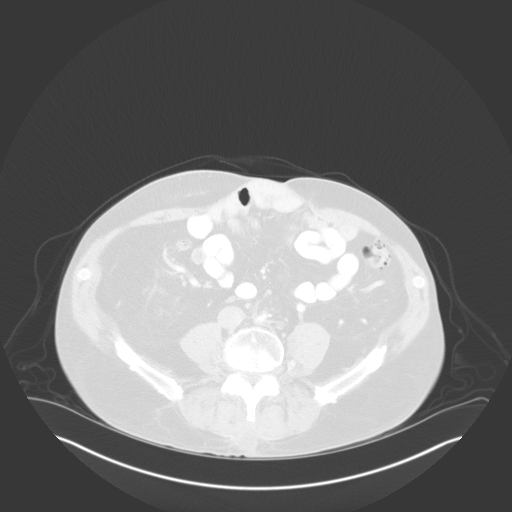
[im 61/135  mediastinal]
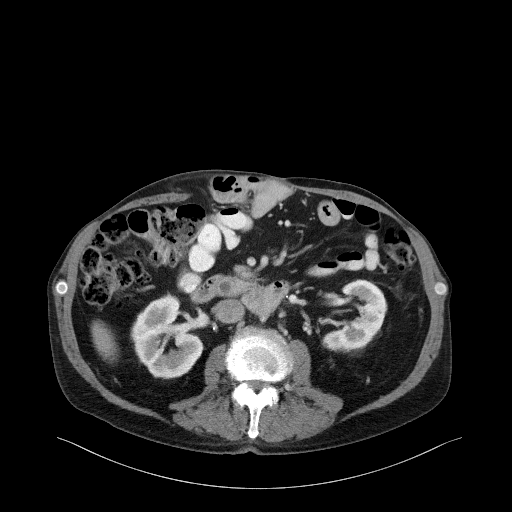
[im 61/135  lung]
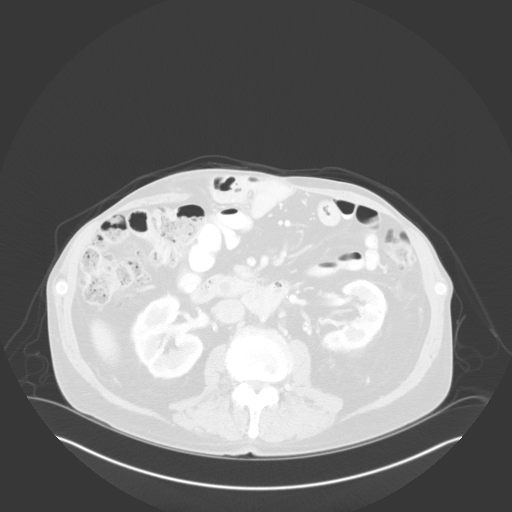
[im 74/135  lung]
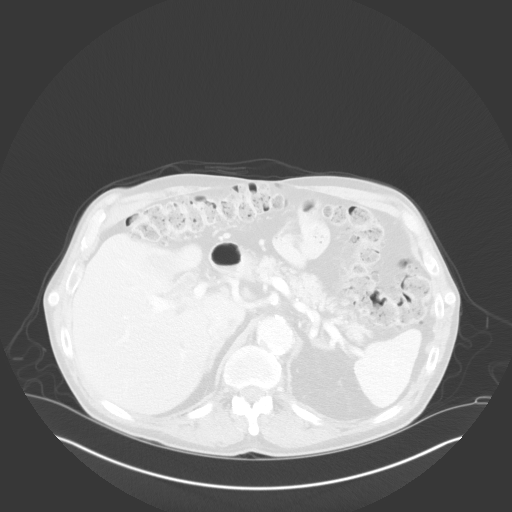
[im 86/135  lung]
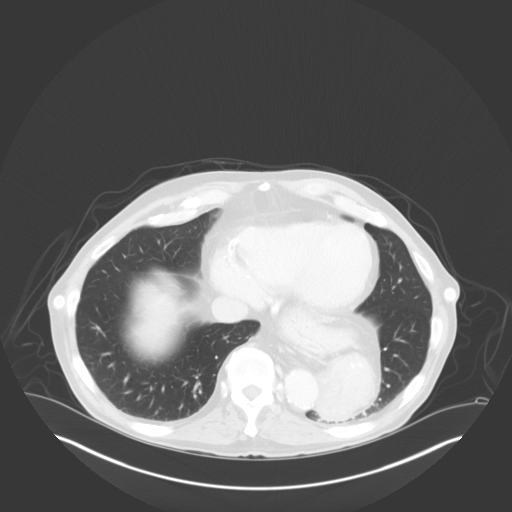
[im 98/135  lung]
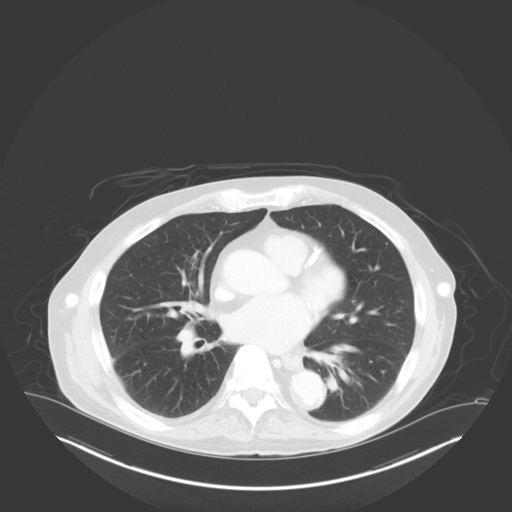
[im 110/135  mediastinal]
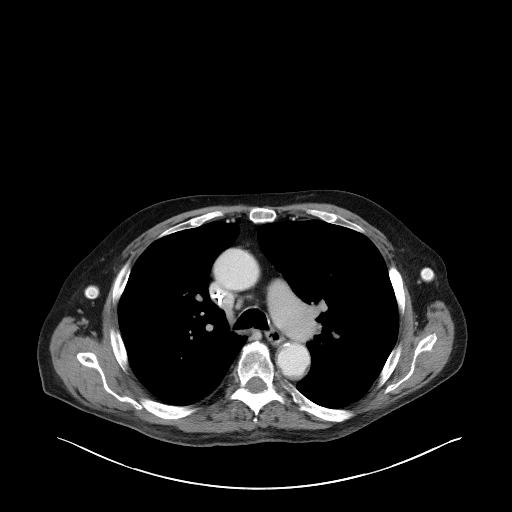
[im 110/135  lung]
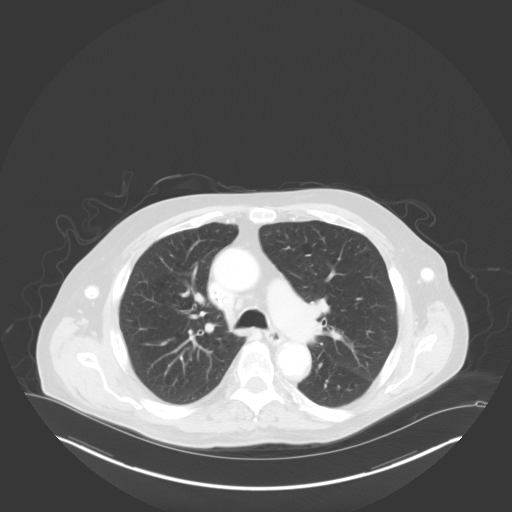
[im 122/135  lung]
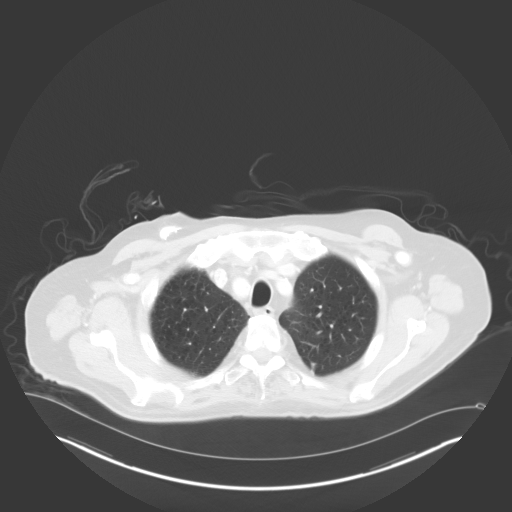

[Series 507: coronals · coronal · 0.82mm/px · 3 of 137 slices shown]
[im 28/137  lung]
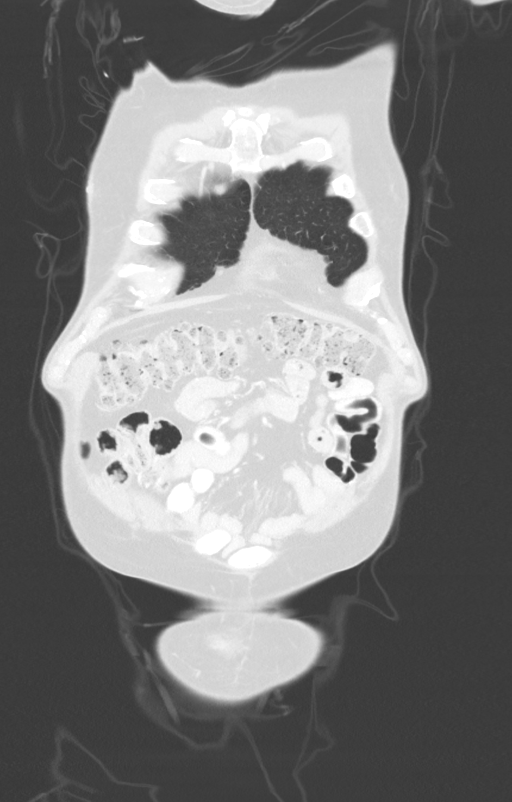
[im 55/137  lung]
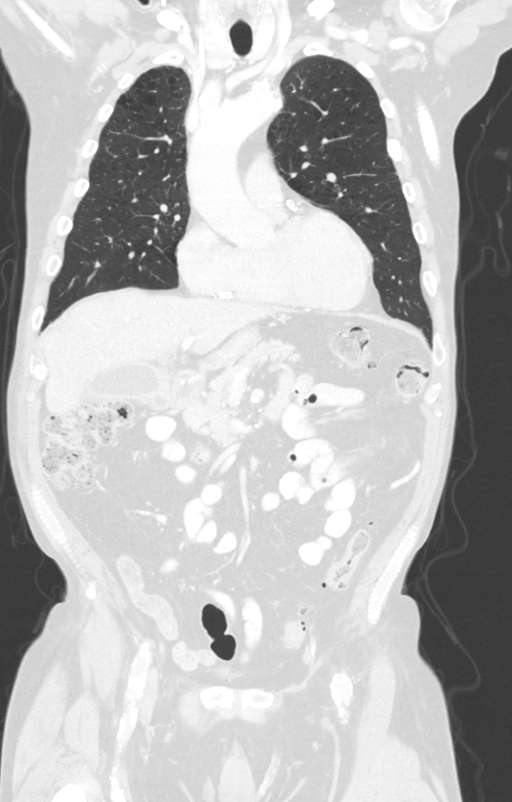
[im 82/137  lung]
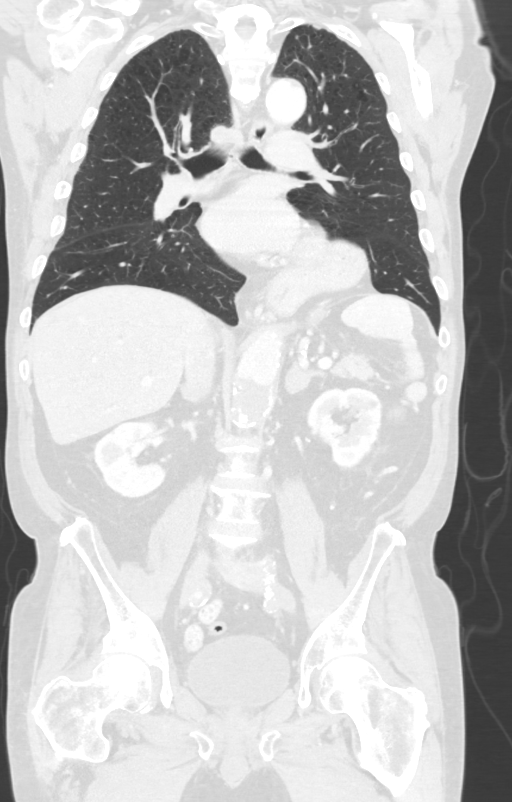

[13 of 36 positions shown; findings below may reference images not displayed]

FINDINGS: CT CHEST FINDINGS

Cardiovascular: Cardiomegaly. Right chest wall port with tip in the
lower SVC. No pericardial effusion. Three-vessel calcifications
effusion atherosclerotic disease of the thoracic aorta with
ulcerated soft plaque of the descending thoracic aorta unchanged
compared to exam no suspicious filling defects of the central
pulmonary arteries.

Mediastinum/Nodes: No pathologically enlarged lymph nodes seen in
chest. Large hiatal hernia. Thyroid is unremarkable.

Lungs/Pleura: Central airways are patent. No new or enlarging
pulmonary nodules.

Musculoskeletal: Unchanged lytic and sclerotic lesion of the right
posterior rib with adjacent fracture. Unchanged lytic and sclerotic
lesion the lateral left rib.

CT ABDOMEN PELVIS FINDINGS

Hepatobiliary: Low-attenuation lesions of the right hepatic lobe
which were new on prior exam are decreased in size. Reference lesion
of the right lobe located on series 2, image 59 measures 5 mm,
previously 11 mm. The previously seen scattered small liver lesions
are unchanged. Atrophy of the left hepatic lobe with mild
intrahepatic biliary ductal dilation. Dilated common bile duct,
unchanged compared to prior.

Pancreas: Unremarkable. No pancreatic ductal dilatation or
surrounding inflammatory changes.

Spleen: Normal in size without focal abnormality.

Adrenals/Urinary Tract: Increased size of left adrenal nodule,
measuring 2.9 x 1.5 cm previously 2.1 x 1.1 cm. Increased size of
right adrenal nodule, measuring up to 1.5 cm previously 0.8 cm.
Kidneys enhance symmetrically with no evidence of hydronephrosis.
Bilateral nonobstructing renal stones. Simple cyst of the left
kidney. Additional bilateral low-density renal lesions are seen
which are too small to completely characterize.

Stomach/Bowel: Stomach is within normal limits. Appendix appears
normal. Diverticula of the descending and sigmoid colon. No evidence
of bowel wall thickening, distention, or inflammatory changes.

Vascular/Lymphatic: Chronic occlusion of the abdominal aorta below
the level of the renal arteries. Status post bilateral axillary to
femoral bypass grafting.

Reproductive: Calcifications of the prostate.

Other: Moderate fat containing right inguinal hernia. Large ventral
abdominal wall hernia containing nondilated loops bowel.

Musculoskeletal: Unchanged sclerotic lesion of the anterior right
acetabulum. Unchanged sclerosis and height loss of the L5 vertebral
body.
IMPRESSION: Bilateral adrenal nodules are increased in size when compared with
most recent prior CT, concerning for progressive disease.

Low-attenuation lesions of the right hepatic lobe which were new on
prior exam are decreased in size.

Unchanged osseous metastatic disease.

Aortic Atherosclerosis ([45]-[45]) and Emphysema ([45]-[45]).

## 2021-05-26 MED ORDER — IOHEXOL 350 MG/ML SOLN
80.0000 mL | Freq: Once | INTRAVENOUS | Status: AC | PRN
  Administered 2021-05-26: 80 mL via INTRAVENOUS

## 2021-05-26 MED ORDER — HEPARIN SOD (PORK) LOCK FLUSH 100 UNIT/ML IV SOLN: 500.0000 [IU] | Freq: Once | INTRAVENOUS | Status: AC

## 2021-05-26 MED ORDER — GADOBUTROL 1 MMOL/ML IV SOLN
8.0000 mL | Freq: Once | INTRAVENOUS | Status: AC | PRN
  Administered 2021-05-26: 8 mL via INTRAVENOUS

## 2021-05-26 MED ORDER — HEPARIN SOD (PORK) LOCK FLUSH 100 UNIT/ML IV SOLN
INTRAVENOUS | Status: AC
  Administered 2021-05-26: 500 [IU] via INTRAVENOUS
  Filled 2021-05-26: qty 5

## 2021-05-26 MED ORDER — HEPARIN SOD (PORK) LOCK FLUSH 100 UNIT/ML IV SOLN
500.0000 [IU] | Freq: Once | INTRAVENOUS | Status: AC
  Administered 2021-05-26: 500 [IU] via INTRAVENOUS

## 2021-05-27 ENCOUNTER — Telehealth: Payer: Self-pay | Admitting: Internal Medicine

## 2021-05-27 NOTE — Telephone Encounter (Signed)
Scheduled appointment per 08/30 sch msg. Left message.

## 2021-05-28 ENCOUNTER — Inpatient Hospital Stay (HOSPITAL_BASED_OUTPATIENT_CLINIC_OR_DEPARTMENT_OTHER): Payer: PPO | Admitting: Internal Medicine

## 2021-05-28 ENCOUNTER — Encounter: Payer: Self-pay | Admitting: Internal Medicine

## 2021-05-28 ENCOUNTER — Other Ambulatory Visit: Payer: Self-pay

## 2021-05-28 VITALS — BP 124/63 | HR 66 | Temp 97.9°F | Resp 19 | Ht 69.0 in | Wt 164.8 lb

## 2021-05-28 DIAGNOSIS — C3491 Malignant neoplasm of unspecified part of right bronchus or lung: Secondary | ICD-10-CM

## 2021-05-28 DIAGNOSIS — Z5112 Encounter for antineoplastic immunotherapy: Secondary | ICD-10-CM | POA: Diagnosis not present

## 2021-05-28 NOTE — Progress Notes (Signed)
[place] [phone]   [phone]  OFFICE PROGRESS NOTE  [name], MD [address] [place] [address]  DIAGNOSIS: extensive stage (T2 a, N2, M1c) small cell lung cancer presented with right hilar mass in addition to mediastinal lymphadenopathy as well as metastatic disease to the bone with complete replacement of the L5 status post laminectomy with resection of epidural tumor as well as metastatic disease to the brain, bones and bilateral adrenal glands diagnosed in January 2022.   PRIOR THERAPY: Status post palliative radiotherapy to the resected area at L5 under the care of Dr. Lisbeth Renshaw.  CURRENT THERAPY: Systemic chemotherapy with carboplatin for AUC of 5 from day 1, etoposide 100 mg/M2 on days 1, 2 and 3 with Cosela 240 mg/M2 before chemotherapy in addition to Imfinzi 1500 mg IV every 3 weeks with day one of the chemotherapy.  First dose December 24, 2020.  Status post 7  cycles.  Starting from cycle #5 the patient will be on maintenance treatment with Imfinzi every 4 weeks.  INTERVAL HISTORY: Gregory Hodges 80 y.o. male returns to the clinic today for follow-up visit accompanied by his wife.  The patient is feeling fine today with no concerning complaints except for mild fatigue.  He denied having any current chest pain, shortness of breath except with exertion with no cough or hemoptysis.  He denied having any fever or chills.  He has no nausea, vomiting, diarrhea or constipation.  He has no headache or visual changes.  He has no weight loss or night sweats.  He has been tolerating his treatment with maintenance immunotherapy fairly well.  The patient had repeat MRI of the brain as well as CT scan of the chest, abdomen pelvis performed recently and he is here for evaluation and discussion of his scan results and treatment options.   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  MEDICATIONS:  Current Outpatient Medications  Medication Sig Dispense Refill   acetaminophen (TYLENOL) 500 MG tablet Take 500 mg by mouth every 6 (six) hours as needed for moderate pain.     aspirin 81 MG tablet Take 81 mg by mouth daily.     atenolol (TENORMIN) 25 MG tablet Take 25 mg by mouth 2 (two) times daily.     calcium carbonate (TUMS - DOSED IN MG ELEMENTAL CALCIUM) 500 MG chewable tablet Chew 2 tablets by mouth daily as needed for indigestion or heartburn.     diphenhydrAMINE (BENADRYL) 25 MG tablet Take 50 mg by mouth daily as needed for allergies.     ferrous sulfate 325 (65 FE) MG tablet Take 325 mg by mouth every Monday, Wednesday, and Friday.  lidocaine-prilocaine (EMLA) cream Apply to the Port-A-Cath site 30-60 minutes before treatment (Patient not taking: Reported on 05/20/2021) 30 g 0   nitroGLYCERIN (NITROSTAT) 0.4 MG SL tablet Place 0.4 mg under the tongue every 5 (five) minutes as needed for chest pain.     omeprazole (PRILOSEC) 10 MG capsule Take 10 mg by mouth daily.     oxymetazoline (AFRIN) 0.05 % nasal spray Place 1 spray into both nostrils 2 (two) times daily as needed for congestion.     prochlorperazine (COMPAZINE) 10 MG tablet Take 1 tablet (10 mg total) by mouth every 6 (six) hours as needed for  nausea or vomiting. 30 tablet 0   rosuvastatin (CRESTOR) 10 MG tablet Take 10 mg by mouth daily.     sulfamethoxazole-trimethoprim (BACTRIM DS) 800-160 MG per tablet Take 1 tablet by mouth 2 (two) times daily. 800-160 mg 60 tablet 11   No current facility-administered medications for this visit.    SURGICAL HISTORY:  Past Surgical History:  Procedure Laterality Date   ABDOMINAL AORTIC ANEURYSM REPAIR  03/03/2010   AXILLARY-FEMORAL BYPASS GRAFT  07/22/2010   bilateral   bowel     herniated bowel   CARDIAC CATHETERIZATION     CORONARY STENT PLACEMENT     CYSTOSCOPY     IR IMAGING GUIDED PORT INSERTION  12/27/2020   LAMINECTOMY N/A 10/22/2020   Procedure: Lumbar five Open Laminectomy for tumor resection;  Surgeon: Judith Part, MD;  Location: [facility];  Service: Neurosurgery;  Laterality: N/A;   MOHS SURGERY     several     REVIEW OF SYSTEMS:  Constitutional: positive for fatigue Eyes: negative Ears, nose, mouth, throat, and face: negative Respiratory: positive for dyspnea on exertion Cardiovascular: negative Gastrointestinal: negative Genitourinary:negative Integument/breast: negative Hematologic/lymphatic: negative Musculoskeletal:negative Neurological: negative Behavioral/Psych: negative Endocrine: negative Allergic/Immunologic: negative   PHYSICAL EXAMINATION: General appearance: alert, cooperative, fatigued, and no distress Head: Normocephalic, without obvious abnormality, atraumatic Neck: no adenopathy, no JVD, supple, symmetrical, trachea midline, and thyroid not enlarged, symmetric, no tenderness/mass/nodules Lymph nodes: Cervical, supraclavicular, and axillary nodes normal. Resp: clear to auscultation bilaterally Back: symmetric, no curvature. ROM normal. No CVA tenderness. Cardio: regular rate and rhythm, S1, S2 normal, no murmur, click, rub or gallop GI: soft, non-tender; bowel sounds normal; no masses,  no organomegaly Extremities: extremities normal,  atraumatic, no cyanosis or edema Neurologic: Alert and oriented X 3, normal strength and tone. Normal symmetric reflexes. Normal coordination and gait  ECOG PERFORMANCE STATUS: 1 - Symptomatic but completely ambulatory  Blood pressure 124/63, pulse 66, temperature 97.9 F (36.6 C), temperature source Tympanic, resp. rate 19, height 5' 9"  (1.753 m), weight 164 lb 12.8 oz (74.8 kg), SpO2 95 %.  LABORATORY DATA: Lab Results  Component Value Date   WBC 3.7 (L) 05/20/2021   HGB 13.4 05/20/2021   HCT 41.9 05/20/2021   MCV 101.2 (H) 05/20/2021   PLT 91 (L) 05/20/2021      Chemistry      Component Value Date/Time   NA 140 05/20/2021 1005   K 4.8 05/20/2021 1005   CL 111 05/20/2021 1005   CO2 22 05/20/2021 1005   BUN 19 05/20/2021 1005   CREATININE 1.04 05/20/2021 1005      Component Value Date/Time   CALCIUM 8.8 (L) 05/20/2021 1005   ALKPHOS 80 05/20/2021 1005   AST 9 (L) 05/20/2021 1005   ALT <6 05/20/2021 1005   BILITOT 0.3 05/20/2021 1005       RADIOGRAPHIC STUDIES: CT Chest W  Contrast  Result Date: 05/26/2021 CLINICAL DATA:  Extensive stage small-cell lung cancer. EXAM: CT CHEST, ABDOMEN, AND PELVIS WITH CONTRAST TECHNIQUE: Multidetector CT imaging of the chest, abdomen and pelvis was performed following the standard protocol during bolus administration of intravenous contrast. CONTRAST:  17m OMNIPAQUE IOHEXOL 350 MG/ML SOLN COMPARISON:  CT chest abdomen and pelvis dated June 2022 FINDINGS: CT CHEST FINDINGS Cardiovascular: Cardiomegaly. Right chest wall port with tip in the lower SVC. No pericardial effusion. Three-vessel calcifications effusion atherosclerotic disease of the thoracic aorta with ulcerated soft plaque of the descending thoracic aorta unchanged compared to exam no suspicious filling defects of the central pulmonary arteries. Mediastinum/Nodes: No pathologically enlarged lymph nodes seen in chest. Large hiatal hernia. Thyroid is unremarkable. Lungs/Pleura: Central  airways are patent. No new or enlarging pulmonary nodules. Musculoskeletal: Unchanged lytic and sclerotic lesion of the right posterior rib with adjacent fracture. Unchanged lytic and sclerotic lesion the lateral left rib. CT ABDOMEN PELVIS FINDINGS Hepatobiliary: Low-attenuation lesions of the right hepatic lobe which were new on prior exam are decreased in size. Reference lesion of the right lobe located on series 2, image 59 measures 5 mm, previously 11 mm. The previously seen scattered small liver lesions are unchanged. Atrophy of the left hepatic lobe with mild intrahepatic biliary ductal dilation. Dilated common bile duct, unchanged compared to prior. Pancreas: Unremarkable. No pancreatic ductal dilatation or surrounding inflammatory changes. Spleen: Normal in size without focal abnormality. Adrenals/Urinary Tract: Increased size of left adrenal nodule, measuring 2.9 x 1.5 cm previously 2.1 x 1.1 cm. Increased size of right adrenal nodule, measuring up to 1.5 cm previously 0.8 cm. Kidneys enhance symmetrically with no evidence of hydronephrosis. Bilateral nonobstructing renal stones. Simple cyst of the left kidney. Additional bilateral low-density renal lesions are seen which are too small to completely characterize. Stomach/Bowel: Stomach is within normal limits. Appendix appears normal. 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 No evidence of bowel wall thickening, distention, or inflammatory changes. 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 femoral bypass grafting. Reproductive: Calcifications of the prostate. Other: Moderate fat containing right inguinal hernia. Large ventral abdominal wall hernia containing nondilated loops bowel. Musculoskeletal: Unchanged sclerotic lesion of the anterior right acetabulum. Unchanged sclerosis and height loss of the L5 vertebral body. IMPRESSION: Bilateral adrenal nodules  are increased in size when compared with most recent prior CT, concerning for progressive disease. Low-attenuation lesions of the right hepatic lobe which were new on prior exam are decreased in size. Unchanged osseous metastatic disease. Aortic Atherosclerosis (ICD10-I70.0) and Emphysema (ICD10-J43.9). Electronically Signed   By: LYetta GlassmanM.D.   On: 05/26/2021 15:58   MR Brain W Wo Contrast  Result Date: 05/26/2021 CLINICAL DATA:  Staging of small cell lung carcinoma EXAM: MRI HEAD WITHOUT AND WITH CONTRAST TECHNIQUE: Multiplanar, multiecho pulse sequences of the brain and surrounding structures were obtained without and with intravenous contrast. CONTRAST:  824mGADAVIST GADOBUTROL 1 MMOL/ML IV SOLN COMPARISON:  02/10/2021 FINDINGS: Brain: No acute infarct, mass effect or extra-axial collection. Chronic blood products in the right cerebellum and left temporal lobe. Hyperintense T2-weighted signal is moderately widespread throughout the white matter. Generalized volume loss without a clear lobar predilection. There are multiple contrast-enhancing lesions: 1. 1.5 cm right cerebellum, image 37, increased in size 2. 9 mm medial right temporal lobe, image 65, new Vascular: Major flow voids are preserved. Skull and upper cervical spine: Normal calvarium and skull base. Visualized upper cervical  spine and soft tissues are normal. Sinuses/Orbits:No paranasal sinus fluid levels or advanced mucosal thickening. No mastoid or middle ear effusion. Normal orbits. IMPRESSION: 1. Larger right cerebellar and new medial right temporal lobe metastases. 2. No acute intracranial abnormality. 3. Chronic microvascular ischemia and volume loss. Electronically Signed   By: Ulyses Jarred M.D.   On: 05/26/2021 21:31   CT Abdomen Pelvis W Contrast  Result Date: 05/26/2021 CLINICAL DATA:  Extensive stage small-cell lung cancer. EXAM: CT CHEST, ABDOMEN, AND PELVIS WITH CONTRAST TECHNIQUE: Multidetector CT imaging of the chest,  abdomen and pelvis was performed following the standard protocol during bolus administration of intravenous contrast. CONTRAST:  4m OMNIPAQUE IOHEXOL 350 MG/ML SOLN COMPARISON:  CT chest abdomen and pelvis dated June 2022 FINDINGS: CT CHEST FINDINGS Cardiovascular: Cardiomegaly. Right chest wall port with tip in the lower SVC. No pericardial effusion. Three-vessel calcifications effusion atherosclerotic disease of the thoracic aorta with ulcerated soft plaque of the descending thoracic aorta unchanged compared to exam no suspicious filling defects of the central pulmonary arteries. Mediastinum/Nodes: No pathologically enlarged lymph nodes seen in chest. Large hiatal hernia. Thyroid is unremarkable. Lungs/Pleura: Central airways are patent. No new or enlarging pulmonary nodules. Musculoskeletal: Unchanged lytic and sclerotic lesion of the right posterior rib with adjacent fracture. Unchanged lytic and sclerotic lesion the lateral left rib. CT ABDOMEN PELVIS FINDINGS Hepatobiliary: Low-attenuation lesions of the right hepatic lobe which were new on prior exam are decreased in size. Reference lesion of the right lobe located on series 2, image 59 measures 5 mm, previously 11 mm. The previously seen scattered small liver lesions are unchanged. Atrophy of the left hepatic lobe with mild intrahepatic biliary ductal dilation. Dilated common bile duct, unchanged compared to prior. Pancreas: Unremarkable. No pancreatic ductal dilatation or surrounding inflammatory changes. Spleen: Normal in size without focal abnormality. Adrenals/Urinary Tract: Increased size of left adrenal nodule, measuring 2.9 x 1.5 cm previously 2.1 x 1.1 cm. Increased size of right adrenal nodule, measuring up to 1.5 cm previously 0.8 cm. Kidneys enhance symmetrically with no evidence of hydronephrosis. Bilateral nonobstructing renal stones. Simple cyst of the left kidney. Additional bilateral low-density renal lesions are seen which are too small to  completely characterize. Stomach/Bowel: Stomach is within normal limits. Appendix appears normal. 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 No evidence of bowel wall thickening, distention, or inflammatory changes. 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 femoral bypass grafting. Reproductive: Calcifications of the prostate. Other: Moderate fat containing right inguinal hernia. Large ventral abdominal wall hernia containing nondilated loops bowel. Musculoskeletal: Unchanged sclerotic lesion of the anterior right acetabulum. Unchanged sclerosis and height loss of the L5 vertebral body. IMPRESSION: Bilateral adrenal nodules are increased in size when compared with most recent prior CT, concerning for progressive disease. Low-attenuation lesions of the right hepatic lobe which were new on prior exam are decreased in size. Unchanged osseous metastatic disease. Aortic Atherosclerosis (ICD10-I70.0) and Emphysema (ICD10-J43.9). Electronically Signed   By: LYetta GlassmanM.D.   On: 05/26/2021 15:58     ASSESSMENT AND PLAN: This is a very pleasant 80 years old white male recently diagnosed with extensive stage (T2 a, N2, M1 C) small cell lung cancer presented with right upper lobe lesion with extensive mediastinal and right hilar adenopathy in addition to extensive bone metastasis as well as metastatic disease to the adrenal gland bilaterally and multiple brain lesions diagnosed in February 2022 status post resection of  the epidural tumor at the L5 as well as palliative radiotherapy to the lesion after resection. The patient is currently undergoing systemic chemotherapy with carboplatin for AUC of 5 on day 1, etoposide 100 Mg/M2 on days 1, 2 and 3 with Cosela on the days of the chemotherapy as well as Imfinzi every 3 weeks.  Status post 7 cycles.  Starting from cycle #5 the patient is on maintenance treatment with  Imfinzi 1500 Mg IV every 4 weeks. The patient has been tolerating his maintenance treatment with Imfinzi fairly well with no concerning adverse effects. He had repeat staging scans including MRI of the brain as well as CT scan of the chest, abdomen pelvis performed recently.  I personally and independently reviewed the scan images and discussed the result and showed the images to the patient and his wife today. His MRI of the brain showed progressive disease in the brain.  He also had enlargement of metastatic lesions in the bilateral adrenal glands. I recommended for the patient to see Dr. Lisbeth Renshaw for consideration of palliative radiotherapy to the brain as well as the bilateral metastatic adrenal gland lesions.  He has a stable disease otherwise in the chest and liver. He will continue his current treatment with Imfinzi as planned and he is expected to come for cycle #8 on June 17, 2021. The patient and his wife are in agreement with the current plan. He was advised to call immediately if he has any other concerning symptoms in the interval.  All questions were answered. The patient knows to call the clinic with any problems, questions or concerns. We can certainly see the patient much sooner if necessary.   Disclaimer: This note was dictated with voice recognition software. Similar sounding words can inadvertently be transcribed and may not be corrected upon review.

## 2021-05-28 NOTE — Patient Instructions (Signed)
Steps to Quit Smoking Smoking tobacco is the leading cause of preventable death. It can affect almost every organ in the body. Smoking puts you and people around you at risk for many serious, long-lasting (chronic) diseases. Quitting smoking can be hard, but it is one of the best things that you can do for your health. It is never too late to quit. How do I get ready to quit? When you decide to quit smoking, make a plan to help you succeed. Before you quit: Pick a date to quit. Set a date within the next 2 weeks to give you time to prepare. Write down the reasons why you are quitting. Keep this list in places where you will see it often. Tell your family, friends, and co-workers that you are quitting. Their support is important. Talk with your doctor about the choices that may help you quit. Find out if your health insurance will pay for these treatments. Know the people, places, things, and activities that make you want to smoke (triggers). Avoid them. What first steps can I take to quit smoking? Throw away all cigarettes at home, at work, and in your car. Throw away the things that you use when you smoke, such as ashtrays and lighters. Clean your car. Make sure to empty the ashtray. Clean your home, including curtains and carpets. What can I do to help me quit smoking? Talk with your doctor about taking medicines and seeing a counselor at the same time. You are more likely to succeed when you do both. If you are pregnant or breastfeeding, talk with your doctor about counseling or other ways to quit smoking. Do not take medicine to help you quit smoking unless your doctor tells you to do so. To quit smoking: Quit right away Quit smoking totally, instead of slowly cutting back on how much you smoke over a period of time. Go to counseling. You are more likely to quit if you go to counseling sessions regularly. Take medicine You may take medicines to help you quit. Some medicines need a  prescription, and some you can buy over-the-counter. Some medicines may contain a drug called nicotine to replace the nicotine in cigarettes. Medicines may: Help you to stop having the desire to smoke (cravings). Help to stop the problems that come when you stop smoking (withdrawal symptoms). Your doctor may ask you to use: Nicotine patches, gum, or lozenges. Nicotine inhalers or sprays. Non-nicotine medicine that is taken by mouth. Find resources Find resources and other ways to help you quit smoking and remain smoke-free after you quit. These resources are most helpful when you use them often. They include: Online chats with a Social worker. Phone quitlines. Printed Furniture conservator/restorer. Support groups or group counseling. Text messaging programs. Mobile phone apps. Use apps on your mobile phone or tablet that can help you stick to your quit plan. There are many free apps for mobile phones and tablets as well as websites. Examples include Quit Guide from the [employer] and smokefree.gov  What things can I do to make it easier to quit?  Talk to your family and friends. Ask them to support and encourage you. Call a phone quitline (1-800-QUIT-NOW), reach out to support groups, or work with a Social worker. Ask people who smoke to not smoke around you. Avoid places that make you want to smoke, such as: Bars. Parties. Smoke-break areas at work. Spend time with people who do not smoke. Lower the stress in your life. Stress can make you want to  smoke. Try these things to help your stress: Getting regular exercise. Doing deep-breathing exercises. Doing yoga. Meditating. Doing a body scan. To do this, close your eyes, focus on one area of your body at a time from head to toe. Notice which parts of your body are tense. Try to relax the muscles in those areas. How will I feel when I quit smoking? Day 1 to 3 weeks Within the first 24 hours, you may start to have some problems that come from quitting tobacco.  These problems are very bad 2-3 days after you quit, but they do not often last for more than 2-3 weeks. You may get these symptoms: Mood swings. Feeling restless, nervous, angry, or annoyed. Trouble concentrating. Dizziness. Strong desire for high-sugar foods and nicotine. Weight gain. Trouble pooping (constipation). Feeling like you may vomit (nausea). Coughing or a sore throat. Changes in how the medicines that you take for other issues work in your body. Depression. Trouble sleeping (insomnia). Week 3 and afterward After the first 2-3 weeks of quitting, you may start to notice more positive results, such as: Better sense of smell and taste. Less coughing and sore throat. Slower heart rate. Lower blood pressure. Clearer skin. Better breathing. Fewer sick days. Quitting smoking can be hard. Do not give up if you fail the first time. Some people need to try a few times before they succeed. Do your best to stick to your quit plan, and talk with your doctor if you have any questions or concerns. Summary Smoking tobacco is the leading cause of preventable death. Quitting smoking can be hard, but it is one of the best things that you can do for your health. When you decide to quit smoking, make a plan to help you succeed. Quit smoking right away, not slowly over a period of time. When you start quitting, seek help from your doctor, family, or friends. This information is not intended to replace advice given to you by your health care provider. Make sure you discuss any questions you have with your health care provider. Document Revised: 06/09/2019 Document Reviewed: 12/03/2018 Elsevier Patient Education  [place].

## 2021-05-29 ENCOUNTER — Ambulatory Visit
Admit: 2021-05-29 | Discharge: 2021-05-29 | Disposition: A | Discharge status: Home / Self Care | Payer: PPO | Attending: Radiation Oncology | Admitting: Radiation Oncology

## 2021-05-29 ENCOUNTER — Ambulatory Visit
Admit: 2021-05-29 | Discharge: 2021-05-29 | Disposition: A | Discharge status: Home / Self Care | Payer: PPO | Source: Ambulatory Visit | Attending: Radiation Oncology | Admitting: Radiation Oncology

## 2021-05-29 ENCOUNTER — Encounter: Payer: Self-pay | Admitting: Radiation Oncology

## 2021-05-29 VITALS — BP 108/57 | HR 60 | Temp 98.0°F | Resp 17 | Ht 69.0 in | Wt 166.0 lb

## 2021-05-29 DIAGNOSIS — C7951 Secondary malignant neoplasm of bone: Secondary | ICD-10-CM | POA: Diagnosis not present

## 2021-05-29 DIAGNOSIS — K4090 Unilateral inguinal hernia, without obstruction or gangrene, not specified as recurrent: Secondary | ICD-10-CM | POA: Diagnosis not present

## 2021-05-29 DIAGNOSIS — I739 Peripheral vascular disease, unspecified: Secondary | ICD-10-CM | POA: Insufficient documentation

## 2021-05-29 DIAGNOSIS — N200 Calculus of kidney: Secondary | ICD-10-CM | POA: Diagnosis not present

## 2021-05-29 DIAGNOSIS — Z79899 Other long term (current) drug therapy: Secondary | ICD-10-CM | POA: Insufficient documentation

## 2021-05-29 DIAGNOSIS — C3490 Malignant neoplasm of unspecified part of unspecified bronchus or lung: Secondary | ICD-10-CM | POA: Diagnosis not present

## 2021-05-29 DIAGNOSIS — Z7982 Long term (current) use of aspirin: Secondary | ICD-10-CM | POA: Insufficient documentation

## 2021-05-29 DIAGNOSIS — K5090 Crohn's disease, unspecified, without complications: Secondary | ICD-10-CM | POA: Insufficient documentation

## 2021-05-29 DIAGNOSIS — C3491 Malignant neoplasm of unspecified part of right bronchus or lung: Secondary | ICD-10-CM | POA: Diagnosis not present

## 2021-05-29 DIAGNOSIS — C7931 Secondary malignant neoplasm of brain: Secondary | ICD-10-CM | POA: Diagnosis not present

## 2021-05-29 DIAGNOSIS — I252 Old myocardial infarction: Secondary | ICD-10-CM | POA: Diagnosis not present

## 2021-05-29 DIAGNOSIS — J439 Emphysema, unspecified: Secondary | ICD-10-CM | POA: Insufficient documentation

## 2021-05-29 DIAGNOSIS — I714 Abdominal aortic aneurysm, without rupture: Secondary | ICD-10-CM | POA: Diagnosis not present

## 2021-05-29 DIAGNOSIS — C7972 Secondary malignant neoplasm of left adrenal gland: Secondary | ICD-10-CM | POA: Insufficient documentation

## 2021-05-29 DIAGNOSIS — I10 Essential (primary) hypertension: Secondary | ICD-10-CM | POA: Diagnosis not present

## 2021-05-29 DIAGNOSIS — F17210 Nicotine dependence, cigarettes, uncomplicated: Secondary | ICD-10-CM | POA: Diagnosis not present

## 2021-05-29 DIAGNOSIS — E785 Hyperlipidemia, unspecified: Secondary | ICD-10-CM | POA: Diagnosis not present

## 2021-05-29 DIAGNOSIS — C7971 Secondary malignant neoplasm of right adrenal gland: Secondary | ICD-10-CM | POA: Diagnosis not present

## 2021-05-29 DIAGNOSIS — Z87442 Personal history of urinary calculi: Secondary | ICD-10-CM | POA: Insufficient documentation

## 2021-05-29 DIAGNOSIS — Z85828 Personal history of other malignant neoplasm of skin: Secondary | ICD-10-CM | POA: Diagnosis not present

## 2021-05-29 DIAGNOSIS — K5730 Diverticulosis of large intestine without perforation or abscess without bleeding: Secondary | ICD-10-CM | POA: Insufficient documentation

## 2021-05-29 DIAGNOSIS — C7970 Secondary malignant neoplasm of unspecified adrenal gland: Secondary | ICD-10-CM | POA: Insufficient documentation

## 2021-05-29 MED ORDER — ONDANSETRON HCL 8 MG PO TABS: 8.0000 mg | ORAL_TABLET | Freq: Three times a day (TID) | ORAL | 0 refills | Status: DC | PRN

## 2021-05-29 MED ORDER — MEMANTINE HCL 5 MG PO TABS: ORAL_TABLET | ORAL | 0 refills | Status: DC

## 2021-05-29 MED ORDER — MEMANTINE HCL 10 MG PO TABS: 10.0000 mg | ORAL_TABLET | Freq: Two times a day (BID) | ORAL | 4 refills | Status: DC

## 2021-05-29 NOTE — Progress Notes (Signed)
The patient called back and left a voicemail and would like to forgo taking Namenda. I called his pharmacy to halt the filling of the prescription.     Carola Rhine, PAC

## 2021-05-29 NOTE — Progress Notes (Signed)
Radiation Oncology         [phone] ________________________________  Name: Gregory Hodges        MRN: [id number]  Date of Service: 05/29/2021 DOB: 10-08-1940  CC:Gregory Pepper, MD  Curt Bears, MD     REFERRING PHYSICIAN: Curt Bears, MD   DIAGNOSIS: The primary encounter diagnosis was Small cell lung cancer, right [facility]). 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   HISTORY OF PRESENT ILLNESS: Gregory Hodges is a 80 y.o. 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 He underwent L5 open lamienectomy for tumor resection on 10/22/2020.  He proceeded with palliative radiotherapy to  this level of the spine. He started chemotherapy with carboplain/etoposide on 12/31/20 and completed this on 03/04/21 but Imfinzi was added and he has continued on single agent immunotherapy with Imfinzi since. He did have imaging early on concerning for brain disease on MRI. He was offered radiotherapy at that time but was not interested in treatment at that time, rather Dr. Julien Nordmann agreed that he could continue chemotherapy with interval scans in hopes that his systemic treatment would stabilize this. He also developed disease in the right pelvis and chest wall along the ribs and completed a course of palliative radiotherapy in April 2022.   He had recent restaging scans on 05/26/21 that showed progressive disease in the adrenal glands now measuring 2.9 cm on the left, and 1.5 cm on the right both had increased in the interval scan compared to June 2022. He also improvement in previously noted liver disease. His MRI brain showed a 1.5 cm right cerebellar mass and a 9 mm medial right temporal lobe mass. With these findings, he's seen to discuss palliative radiotherapy.      PREVIOUS RADIATION THERAPY:   12/30/2020 through 01/16/2021 Site Technique  Total Dose (Gy) Dose per Fx (Gy) Completed Fx Beam Energies  Hip, Right: Pelvis_Rt Complex 35/35 2.5 14/14 6X, 10X  Ribs, Right: Chest_Rt_Rt 7 Complex 35/35 2.5 14/14 10X   11/11/2020 through 11/22/2020 Site Technique Total Dose (Gy) Dose per Fx (Gy) Completed Fx Beam Energies  Lumbar Spine: Spine_L5 3D 30/30 3 10/10 15X    PAST MEDICAL HISTORY:  Past Medical History:  Diagnosis Date   AAA (abdominal aortic aneurysm) (HCC)    s/p open repair using aortobifemoral graft 03/03/10 ([facility]), complicated by wound dehisence, enterocutaneous fistula; developed aortic graft infection s/p explant of graft and placement of bilateral axillofemoral grafts 07/22/10 [facility])   Cancer ([place])    Skin   Crohn's disease ([place])    E coli bacteremia    History of kidney stones    Hyperlipemia    Hypertension    "denies"   Myocardial infarction ([place]) 08/11/2005   s/p Horizon study stent D1   Peripheral vascular disease ([place]) June 2011   SCLC (small cell lung carcinoma) ([place]) dx'd 10/2020   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 No pertinent family history.   SOCIAL HISTORY:  reports that he has been smoking cigarettes. He has a 30.00 pack-year smoking history. He has never used smokeless tobacco. He reports that he does not currently use alcohol. He reports that he does not use drugs. The patient is married and lives in  [place].    ALLERGIES: Oxycodone, Codeine, and Lisinopril   MEDICATIONS:  Current Outpatient Medications  Medication Sig Dispense Refill   acetaminophen (TYLENOL) 500 MG tablet Take 500 mg by mouth every 6 (six) hours as needed for moderate pain.     aspirin 81 MG tablet Take 81 mg by mouth daily.     atenolol (TENORMIN) 25 MG tablet Take 25 mg by mouth 2 (two) times daily.     calcium carbonate (TUMS - DOSED IN MG ELEMENTAL CALCIUM) 500 MG chewable tablet Chew 2 tablets by mouth daily as needed for indigestion or heartburn.     diphenhydrAMINE (BENADRYL) 25 MG tablet Take 50 mg by mouth daily as needed for allergies.     ferrous sulfate 325 (65 FE) MG tablet Take 325 mg by mouth every Monday, Wednesday, and Friday.     lidocaine-prilocaine (EMLA) cream Apply to the Port-A-Cath site 30-60 minutes before treatment 30 g 0   memantine (NAMENDA) 10 MG tablet Take 1 tablet (10 mg total) by mouth 2 (two) times daily. 60 tablet 4   memantine (NAMENDA) 5 MG tablet Begin this prescription the first day of brain radiation. Week 1: take one tablet po qam. Week 2: take one tablet qam and qpm. Week 3: take two tablets qam, and one tablet po q pm. Week 4: take two tablets qam and qpm. Fill subsequent prescription q month. 70 tablet 0   nitroGLYCERIN (NITROSTAT) 0.4 MG SL tablet Place 0.4 mg under the tongue every 5 (five) minutes as needed for chest pain.     omeprazole (PRILOSEC) 10 MG capsule Take 10 mg by mouth daily.     ondansetron (ZOFRAN) 8 MG tablet Take 1 tablet (8 mg total) by mouth every 8 (eight) hours as needed for nausea or vomiting. 20 tablet 0   oxymetazoline (AFRIN) 0.05 % nasal spray Place 1 spray into both nostrils 2 (two) times daily as needed for congestion.     rosuvastatin (CRESTOR) 10 MG tablet Take 10 mg by mouth daily.     prochlorperazine (COMPAZINE) 10 MG tablet Take 1 tablet (10 mg total) by mouth every 6 (six) hours as needed for nausea or vomiting. (Patient not taking:  Reported on 05/29/2021) 30 tablet 0   No current facility-administered medications for this encounter.     REVIEW OF SYSTEMS: On review of systems, the patient reports he is fatigued from other therapies. He continues to have outpouching of his abdomen as a result of a chronic hernia following his AAA repair several years ago. He denies any abodminal pain, nausea, vomiting, constipation or diarrhea. No urinary complaints are verbalized. He denies headaches, visual or auditory changes, loss of consciousness, dizziness, gait disturbances or imbalance. No other complaints are verbalized.      PHYSICAL EXAM:  Wt Readings from Last 3 Encounters:  05/29/21 166 lb (75.3 kg)  05/28/21 164 lb 12.8 oz (74.8 kg)  05/20/21 163 lb 4.8 oz (  74.1 kg)   Temp Readings from Last 3 Encounters:  05/29/21 98 F (36.7 C) (Oral)  05/28/21 97.9 F (36.6 C) (Tympanic)  05/20/21 97.6 F (36.4 C) (Tympanic)   BP Readings from Last 3 Encounters:  05/29/21 (!) 108/57  05/28/21 124/63  05/20/21 126/66   Pulse Readings from Last 3 Encounters:  05/29/21 60  05/28/21 66  05/20/21 62   Pain Assessment Pain Score: 0-No pain/10  In general this is a well appearing Caucasian male in no acute distress. He's alert and oriented x4 and appropriate throughout the examination. Cardiopulmonary assessment is negative for acute distress and he exhibits normal effort.  He has a large abdominal wall defect in the right mid abdomen.  ECOG = 1  0 - Asymptomatic (Fully active, able to carry on all predisease activities without restriction)  1 - Symptomatic but completely ambulatory (Restricted in physically strenuous activity but ambulatory and able to carry out work of a light or sedentary nature. For example, light housework, office work)  2 - Symptomatic, <50% in bed during the day (Ambulatory and capable of all self care but unable to carry out any work activities. Up and about more than 50% of waking hours)  3 -  Symptomatic, >50% in bed, but not bedbound (Capable of only limited self-care, confined to bed or chair 50% or more of waking hours)  4 - Bedbound (Completely disabled. Cannot carry on any self-care. Totally confined to bed or chair)  5 - Death   Eustace Pen MM, Creech RH, Tormey DC, et al. [phone]). "Toxicity and response criteria of the [facility]". [place] Oncol. 5 (6): 649-55    LABORATORY DATA:  Lab Results  Component Value Date   WBC 3.7 (L) 05/20/2021   HGB 13.4 05/20/2021   HCT 41.9 05/20/2021   MCV 101.2 (H) 05/20/2021   PLT 91 (L) 05/20/2021   Lab Results  Component Value Date   NA 140 05/20/2021   K 4.8 05/20/2021   CL 111 05/20/2021   CO2 22 05/20/2021   Lab Results  Component Value Date   ALT <6 05/20/2021   AST 9 (L) 05/20/2021   ALKPHOS 80 05/20/2021   BILITOT 0.3 05/20/2021      RADIOGRAPHY: CT Chest W Contrast  Result Date: 05/26/2021 CLINICAL DATA:  Extensive stage small-cell lung cancer. EXAM: CT CHEST, ABDOMEN, AND PELVIS WITH CONTRAST TECHNIQUE: Multidetector CT imaging of the chest, abdomen and pelvis was performed following the standard protocol during bolus administration of intravenous contrast. CONTRAST:  28m OMNIPAQUE IOHEXOL 350 MG/ML SOLN COMPARISON:  CT chest abdomen and pelvis dated June 2022 FINDINGS: CT CHEST FINDINGS Cardiovascular: Cardiomegaly. Right chest wall port with tip in the lower SVC. No pericardial effusion. Three-vessel calcifications effusion atherosclerotic disease of the thoracic aorta with ulcerated soft plaque of the descending thoracic aorta unchanged compared to exam no suspicious filling defects of the central pulmonary arteries. Mediastinum/Nodes: No pathologically enlarged lymph nodes seen in chest. Large hiatal hernia. Thyroid is unremarkable. Lungs/Pleura: Central airways are patent. No new or enlarging pulmonary nodules. Musculoskeletal: Unchanged lytic and sclerotic lesion of the right posterior rib  with adjacent fracture. Unchanged lytic and sclerotic lesion the lateral left rib. CT ABDOMEN PELVIS FINDINGS Hepatobiliary: Low-attenuation lesions of the right hepatic lobe which were new on prior exam are decreased in size. Reference lesion of the right lobe located on series 2, image 59 measures 5 mm, previously 11 mm. The previously seen scattered small liver lesions are unchanged.  Atrophy of the left hepatic lobe with mild intrahepatic biliary ductal dilation. Dilated common bile duct, unchanged compared to prior. Pancreas: Unremarkable. No pancreatic ductal dilatation or surrounding inflammatory changes. Spleen: Normal in size without focal abnormality. Adrenals/Urinary Tract: Increased size of left adrenal nodule, measuring 2.9 x 1.5 cm previously 2.1 x 1.1 cm. Increased size of right adrenal nodule, measuring up to 1.5 cm previously 0.8 cm. Kidneys enhance symmetrically with no evidence of hydronephrosis. Bilateral nonobstructing renal stones. Simple cyst of the left kidney. Additional bilateral low-density renal lesions are seen which are too small to completely characterize. Stomach/Bowel: Stomach is within normal limits. Appendix appears normal. 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 No evidence of bowel wall thickening, distention, or inflammatory changes. 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 femoral bypass grafting. Reproductive: Calcifications of the prostate. Other: Moderate fat containing right inguinal hernia. Large ventral abdominal wall hernia containing nondilated loops bowel. Musculoskeletal: Unchanged sclerotic lesion of the anterior right acetabulum. Unchanged sclerosis and height loss of the L5 vertebral body. IMPRESSION: Bilateral adrenal nodules are increased in size when compared with most recent prior CT, concerning for progressive disease. Low-attenuation lesions of the right  hepatic lobe which were new on prior exam are decreased in size. Unchanged osseous metastatic disease. Aortic Atherosclerosis (ICD10-I70.0) and Emphysema (ICD10-J43.9). Electronically Signed   By: Yetta Glassman M.D.   On: 05/26/2021 15:58   MR Brain W Wo Contrast  Result Date: 05/26/2021 CLINICAL DATA:  Staging of small cell lung carcinoma EXAM: MRI HEAD WITHOUT AND WITH CONTRAST TECHNIQUE: Multiplanar, multiecho pulse sequences of the brain and surrounding structures were obtained without and with intravenous contrast. CONTRAST:  56m GADAVIST GADOBUTROL 1 MMOL/ML IV SOLN COMPARISON:  02/10/2021 FINDINGS: Brain: No acute infarct, mass effect or extra-axial collection. Chronic blood products in the right cerebellum and left temporal lobe. Hyperintense T2-weighted signal is moderately widespread throughout the white matter. Generalized volume loss without a clear lobar predilection. There are multiple contrast-enhancing lesions: 1. 1.5 cm right cerebellum, image 37, increased in size 2. 9 mm medial right temporal lobe, image 65, new Vascular: Major flow voids are preserved. Skull and upper cervical spine: Normal calvarium and skull base. Visualized upper cervical spine and soft tissues are normal. Sinuses/Orbits:No paranasal sinus fluid levels or advanced mucosal thickening. No mastoid or middle ear effusion. Normal orbits. IMPRESSION: 1. Larger right cerebellar and new medial right temporal lobe metastases. 2. No acute intracranial abnormality. 3. Chronic microvascular ischemia and volume loss. Electronically Signed   By: KUlyses JarredM.D.   On: 05/26/2021 21:31   CT Abdomen Pelvis W Contrast  Result Date: 05/26/2021 CLINICAL DATA:  Extensive stage small-cell lung cancer. EXAM: CT CHEST, ABDOMEN, AND PELVIS WITH CONTRAST TECHNIQUE: Multidetector CT imaging of the chest, abdomen and pelvis was performed following the standard protocol during bolus administration of intravenous contrast. CONTRAST:  846m OMNIPAQUE IOHEXOL 350 MG/ML SOLN COMPARISON:  CT chest abdomen and pelvis dated June 2022 FINDINGS: CT CHEST FINDINGS Cardiovascular: Cardiomegaly. Right chest wall port with tip in the lower SVC. No pericardial effusion. Three-vessel calcifications effusion atherosclerotic disease of the thoracic aorta with ulcerated soft plaque of the descending thoracic aorta unchanged compared to exam no suspicious filling defects of the central pulmonary arteries. Mediastinum/Nodes: No pathologically enlarged lymph nodes seen in chest. Large hiatal hernia. Thyroid is unremarkable. Lungs/Pleura: Central airways are patent. No new or enlarging pulmonary nodules. Musculoskeletal: Unchanged lytic and sclerotic  lesion of the right posterior rib with adjacent fracture. Unchanged lytic and sclerotic lesion the lateral left rib. CT ABDOMEN PELVIS FINDINGS Hepatobiliary: Low-attenuation lesions of the right hepatic lobe which were new on prior exam are decreased in size. Reference lesion of the right lobe located on series 2, image 59 measures 5 mm, previously 11 mm. The previously seen scattered small liver lesions are unchanged. Atrophy of the left hepatic lobe with mild intrahepatic biliary ductal dilation. Dilated common bile duct, unchanged compared to prior. Pancreas: Unremarkable. No pancreatic ductal dilatation or surrounding inflammatory changes. Spleen: Normal in size without focal abnormality. Adrenals/Urinary Tract: Increased size of left adrenal nodule, measuring 2.9 x 1.5 cm previously 2.1 x 1.1 cm. Increased size of right adrenal nodule, measuring up to 1.5 cm previously 0.8 cm. Kidneys enhance symmetrically with no evidence of hydronephrosis. Bilateral nonobstructing renal stones. Simple cyst of the left kidney. Additional bilateral low-density renal lesions are seen which are too small to completely characterize. Stomach/Bowel: Stomach is within normal limits. Appendix appears normal. Diverticula of the descending and  sigmoid colon. No evidence of bowel wall thickening, distention, or inflammatory changes. 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 femoral bypass grafting. Reproductive: Calcifications of the prostate. Other: Moderate fat containing right inguinal hernia. Large ventral abdominal wall hernia containing nondilated loops bowel. Musculoskeletal: Unchanged sclerotic lesion of the anterior right acetabulum. Unchanged sclerosis and height loss of the L5 vertebral body. IMPRESSION: Bilateral adrenal nodules are increased in size when compared with most recent prior CT, concerning for progressive disease. Low-attenuation lesions of the right hepatic lobe which were new on prior exam are decreased in size. Unchanged osseous metastatic disease. Aortic Atherosclerosis (ICD10-I70.0) and Emphysema (ICD10-J43.9). Electronically Signed   By: Yetta Glassman M.D.   On: 05/26/2021 15:58        IMPRESSION/PLAN: 1. 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 Dr. Lisbeth Renshaw discusses the patient's course since our last visit and reviews his imaging results of the brain and CT scan CAP.Dr. Lisbeth Renshaw discusses the options of whole brain radiotherapy with Namenda to preserve cognition, as well as palliative radiotherapy to bilateral adrenal glands.  We discussed the risks, benefits, short, and long term effects of radiotherapy, as well as the palliative intent, and the patient is interested in proceeding. Dr. Lisbeth Renshaw discusses the delivery and logistics of radiotherapy and anticipates a course of 2 weeks of radiotherapy to the brain and bilateral adrenals. Written consent is obtained and placed in the chart, a copy was provided to the patient. Simulation will take place tomorrow. A new rx was sent in for Zofran after side effect profile was reviewed, the same is the case for Namenda.      In a visit lasting  50 minutes, greater than 50% of the time was spent face to face discussing the patient's condition, in preparation for the discussion, and coordinating the patient's care.   The above documentation reflects my direct findings during this shared patient visit. Please see the separate note by Dr. Lisbeth Renshaw on this date for the remainder of the patient's plan of care.    Carola Rhine, PAC

## 2021-05-29 NOTE — Addendum Note (Signed)
Encounter addended by: Hayden Pedro, PA-C on: 05/29/2021 3:56 PM  Actions taken: Medication long-term status modified, Order list changed, Clinical Note Signed

## 2021-05-29 NOTE — Progress Notes (Signed)
Patient doing well. Denies headaches, fatigue, vision/ hearing/ memory/ cognitive decline, aphasia, or oral/ skin irritation.  Meaningful use questions complete.  BP (!) 108/57 (BP Location: Right Arm, Patient Position: Sitting, Cuff Size: Normal)   Pulse 60   Temp 98 F (36.7 C) (Oral)   Resp 17   Ht 5' 9"  (1.753 m)   Wt 166 lb (75.3 kg)   SpO2 100%   BMI 24.51 kg/m

## 2021-05-29 NOTE — Addendum Note (Signed)
Encounter addended by: Mollie Germany, LPN on: 10/06/120 2:41 AM  Actions taken: Visit diagnoses modified

## 2021-05-30 ENCOUNTER — Other Ambulatory Visit: Payer: Self-pay

## 2021-05-30 ENCOUNTER — Ambulatory Visit
Admit: 2021-05-30 | Discharge: 2021-05-30 | Disposition: A | Discharge status: Home / Self Care | Payer: PPO | Source: Ambulatory Visit | Attending: Radiation Oncology | Admitting: Radiation Oncology

## 2021-05-30 DIAGNOSIS — C7971 Secondary malignant neoplasm of right adrenal gland: Secondary | ICD-10-CM | POA: Diagnosis not present

## 2021-05-30 DIAGNOSIS — C7972 Secondary malignant neoplasm of left adrenal gland: Secondary | ICD-10-CM | POA: Insufficient documentation

## 2021-05-30 DIAGNOSIS — C3490 Malignant neoplasm of unspecified part of unspecified bronchus or lung: Secondary | ICD-10-CM | POA: Insufficient documentation

## 2021-05-30 DIAGNOSIS — C7951 Secondary malignant neoplasm of bone: Secondary | ICD-10-CM | POA: Insufficient documentation

## 2021-05-30 DIAGNOSIS — Z510 Encounter for antineoplastic radiation therapy: Secondary | ICD-10-CM | POA: Diagnosis not present

## 2021-05-30 DIAGNOSIS — C3491 Malignant neoplasm of unspecified part of right bronchus or lung: Secondary | ICD-10-CM | POA: Diagnosis not present

## 2021-05-30 DIAGNOSIS — C7931 Secondary malignant neoplasm of brain: Secondary | ICD-10-CM | POA: Insufficient documentation

## 2021-06-08 DIAGNOSIS — C7931 Secondary malignant neoplasm of brain: Secondary | ICD-10-CM | POA: Diagnosis not present

## 2021-06-08 DIAGNOSIS — C3491 Malignant neoplasm of unspecified part of right bronchus or lung: Secondary | ICD-10-CM | POA: Diagnosis not present

## 2021-06-08 DIAGNOSIS — C7972 Secondary malignant neoplasm of left adrenal gland: Secondary | ICD-10-CM | POA: Diagnosis not present

## 2021-06-08 DIAGNOSIS — C7971 Secondary malignant neoplasm of right adrenal gland: Secondary | ICD-10-CM | POA: Diagnosis not present

## 2021-06-08 DIAGNOSIS — Z510 Encounter for antineoplastic radiation therapy: Secondary | ICD-10-CM | POA: Diagnosis not present

## 2021-06-09 ENCOUNTER — Other Ambulatory Visit: Payer: Self-pay

## 2021-06-09 ENCOUNTER — Ambulatory Visit
Admission: RE | Admit: 2021-06-09 | Discharge: 2021-06-09 | Disposition: A | Discharge status: Home / Self Care | Payer: PPO | Source: Ambulatory Visit | Attending: Radiation Oncology | Admitting: Radiation Oncology

## 2021-06-09 DIAGNOSIS — Z510 Encounter for antineoplastic radiation therapy: Secondary | ICD-10-CM | POA: Diagnosis not present

## 2021-06-09 DIAGNOSIS — C3491 Malignant neoplasm of unspecified part of right bronchus or lung: Secondary | ICD-10-CM | POA: Diagnosis not present

## 2021-06-09 DIAGNOSIS — C7931 Secondary malignant neoplasm of brain: Secondary | ICD-10-CM | POA: Diagnosis not present

## 2021-06-09 DIAGNOSIS — C7972 Secondary malignant neoplasm of left adrenal gland: Secondary | ICD-10-CM | POA: Diagnosis not present

## 2021-06-09 DIAGNOSIS — C7971 Secondary malignant neoplasm of right adrenal gland: Secondary | ICD-10-CM | POA: Diagnosis not present

## 2021-06-10 ENCOUNTER — Ambulatory Visit
Admission: RE | Admit: 2021-06-10 | Discharge: 2021-06-10 | Disposition: A | Discharge status: Home / Self Care | Payer: PPO | Source: Ambulatory Visit | Attending: Radiation Oncology | Admitting: Radiation Oncology

## 2021-06-10 ENCOUNTER — Other Ambulatory Visit: Payer: Self-pay

## 2021-06-10 DIAGNOSIS — Z510 Encounter for antineoplastic radiation therapy: Secondary | ICD-10-CM | POA: Diagnosis not present

## 2021-06-10 DIAGNOSIS — C7971 Secondary malignant neoplasm of right adrenal gland: Secondary | ICD-10-CM | POA: Diagnosis not present

## 2021-06-10 DIAGNOSIS — C7931 Secondary malignant neoplasm of brain: Secondary | ICD-10-CM | POA: Diagnosis not present

## 2021-06-10 DIAGNOSIS — C7972 Secondary malignant neoplasm of left adrenal gland: Secondary | ICD-10-CM | POA: Diagnosis not present

## 2021-06-10 DIAGNOSIS — C3491 Malignant neoplasm of unspecified part of right bronchus or lung: Secondary | ICD-10-CM | POA: Diagnosis not present

## 2021-06-11 ENCOUNTER — Ambulatory Visit
Admission: RE | Admit: 2021-06-11 | Discharge: 2021-06-11 | Disposition: A | Discharge status: Home / Self Care | Payer: PPO | Source: Ambulatory Visit | Attending: Radiation Oncology | Admitting: Radiation Oncology

## 2021-06-11 DIAGNOSIS — C7931 Secondary malignant neoplasm of brain: Secondary | ICD-10-CM | POA: Diagnosis not present

## 2021-06-11 DIAGNOSIS — C7971 Secondary malignant neoplasm of right adrenal gland: Secondary | ICD-10-CM | POA: Diagnosis not present

## 2021-06-11 DIAGNOSIS — C7972 Secondary malignant neoplasm of left adrenal gland: Secondary | ICD-10-CM | POA: Diagnosis not present

## 2021-06-11 DIAGNOSIS — Z510 Encounter for antineoplastic radiation therapy: Secondary | ICD-10-CM | POA: Diagnosis not present

## 2021-06-11 DIAGNOSIS — C3491 Malignant neoplasm of unspecified part of right bronchus or lung: Secondary | ICD-10-CM | POA: Diagnosis not present

## 2021-06-12 ENCOUNTER — Other Ambulatory Visit: Payer: Self-pay

## 2021-06-12 ENCOUNTER — Ambulatory Visit
Admission: RE | Admit: 2021-06-12 | Discharge: 2021-06-12 | Disposition: A | Discharge status: Home / Self Care | Payer: PPO | Source: Ambulatory Visit | Attending: Radiation Oncology | Admitting: Radiation Oncology

## 2021-06-12 DIAGNOSIS — C7972 Secondary malignant neoplasm of left adrenal gland: Secondary | ICD-10-CM | POA: Diagnosis not present

## 2021-06-12 DIAGNOSIS — C3491 Malignant neoplasm of unspecified part of right bronchus or lung: Secondary | ICD-10-CM | POA: Diagnosis not present

## 2021-06-12 DIAGNOSIS — Z510 Encounter for antineoplastic radiation therapy: Secondary | ICD-10-CM | POA: Diagnosis not present

## 2021-06-12 DIAGNOSIS — C7971 Secondary malignant neoplasm of right adrenal gland: Secondary | ICD-10-CM | POA: Diagnosis not present

## 2021-06-12 DIAGNOSIS — C7931 Secondary malignant neoplasm of brain: Secondary | ICD-10-CM | POA: Diagnosis not present

## 2021-06-13 ENCOUNTER — Ambulatory Visit
Admission: RE | Admit: 2021-06-13 | Discharge: 2021-06-13 | Disposition: A | Discharge status: Home / Self Care | Payer: PPO | Source: Ambulatory Visit | Attending: Radiation Oncology | Admitting: Radiation Oncology

## 2021-06-13 DIAGNOSIS — C3491 Malignant neoplasm of unspecified part of right bronchus or lung: Secondary | ICD-10-CM | POA: Diagnosis not present

## 2021-06-13 DIAGNOSIS — C7972 Secondary malignant neoplasm of left adrenal gland: Secondary | ICD-10-CM | POA: Diagnosis not present

## 2021-06-13 DIAGNOSIS — Z510 Encounter for antineoplastic radiation therapy: Secondary | ICD-10-CM | POA: Diagnosis not present

## 2021-06-13 DIAGNOSIS — C7931 Secondary malignant neoplasm of brain: Secondary | ICD-10-CM | POA: Diagnosis not present

## 2021-06-13 DIAGNOSIS — C7971 Secondary malignant neoplasm of right adrenal gland: Secondary | ICD-10-CM | POA: Diagnosis not present

## 2021-06-16 ENCOUNTER — Ambulatory Visit
Admission: RE | Admit: 2021-06-16 | Discharge: 2021-06-16 | Disposition: A | Discharge status: Home / Self Care | Payer: PPO | Source: Ambulatory Visit | Attending: Radiation Oncology | Admitting: Radiation Oncology

## 2021-06-16 ENCOUNTER — Other Ambulatory Visit: Payer: Self-pay

## 2021-06-16 DIAGNOSIS — C7972 Secondary malignant neoplasm of left adrenal gland: Secondary | ICD-10-CM | POA: Diagnosis not present

## 2021-06-16 DIAGNOSIS — C7931 Secondary malignant neoplasm of brain: Secondary | ICD-10-CM | POA: Diagnosis not present

## 2021-06-16 DIAGNOSIS — C3491 Malignant neoplasm of unspecified part of right bronchus or lung: Secondary | ICD-10-CM | POA: Diagnosis not present

## 2021-06-16 DIAGNOSIS — C7971 Secondary malignant neoplasm of right adrenal gland: Secondary | ICD-10-CM | POA: Diagnosis not present

## 2021-06-16 DIAGNOSIS — Z510 Encounter for antineoplastic radiation therapy: Secondary | ICD-10-CM | POA: Diagnosis not present

## 2021-06-17 ENCOUNTER — Ambulatory Visit
Admission: RE | Admit: 2021-06-17 | Discharge: 2021-06-17 | Disposition: A | Discharge status: Home / Self Care | Payer: PPO | Source: Ambulatory Visit | Attending: Radiation Oncology | Admitting: Radiation Oncology

## 2021-06-17 ENCOUNTER — Encounter: Payer: Self-pay | Admitting: Internal Medicine

## 2021-06-17 ENCOUNTER — Inpatient Hospital Stay: Payer: PPO | Admitting: Dietician

## 2021-06-17 ENCOUNTER — Encounter: Payer: Self-pay | Admitting: *Deleted

## 2021-06-17 ENCOUNTER — Inpatient Hospital Stay: Payer: PPO

## 2021-06-17 ENCOUNTER — Inpatient Hospital Stay: Payer: PPO | Attending: Neurological Surgery | Admitting: Internal Medicine

## 2021-06-17 VITALS — BP 105/66 | HR 60 | Temp 97.4°F | Resp 17 | Wt 166.8 lb

## 2021-06-17 DIAGNOSIS — C3491 Malignant neoplasm of unspecified part of right bronchus or lung: Secondary | ICD-10-CM

## 2021-06-17 DIAGNOSIS — I959 Hypotension, unspecified: Secondary | ICD-10-CM | POA: Insufficient documentation

## 2021-06-17 DIAGNOSIS — Z95828 Presence of other vascular implants and grafts: Secondary | ICD-10-CM

## 2021-06-17 DIAGNOSIS — C7972 Secondary malignant neoplasm of left adrenal gland: Secondary | ICD-10-CM | POA: Diagnosis not present

## 2021-06-17 DIAGNOSIS — C7951 Secondary malignant neoplasm of bone: Secondary | ICD-10-CM | POA: Insufficient documentation

## 2021-06-17 DIAGNOSIS — Z7982 Long term (current) use of aspirin: Secondary | ICD-10-CM | POA: Insufficient documentation

## 2021-06-17 DIAGNOSIS — Z79899 Other long term (current) drug therapy: Secondary | ICD-10-CM | POA: Diagnosis not present

## 2021-06-17 DIAGNOSIS — Z510 Encounter for antineoplastic radiation therapy: Secondary | ICD-10-CM | POA: Diagnosis not present

## 2021-06-17 DIAGNOSIS — F17210 Nicotine dependence, cigarettes, uncomplicated: Secondary | ICD-10-CM | POA: Insufficient documentation

## 2021-06-17 DIAGNOSIS — Z5112 Encounter for antineoplastic immunotherapy: Secondary | ICD-10-CM | POA: Insufficient documentation

## 2021-06-17 DIAGNOSIS — C7970 Secondary malignant neoplasm of unspecified adrenal gland: Secondary | ICD-10-CM | POA: Diagnosis not present

## 2021-06-17 DIAGNOSIS — C7931 Secondary malignant neoplasm of brain: Secondary | ICD-10-CM | POA: Diagnosis not present

## 2021-06-17 DIAGNOSIS — C7971 Secondary malignant neoplasm of right adrenal gland: Secondary | ICD-10-CM | POA: Diagnosis not present

## 2021-06-17 LAB — CBC WITH DIFFERENTIAL (CANCER CENTER ONLY)
Abs Immature Granulocytes: 0.03 10*3/uL (ref 0.00–0.07)
Basophils Absolute: 0 10*3/uL (ref 0.0–0.1)
Basophils Relative: 1 %
Eosinophils Absolute: 0.2 10*3/uL (ref 0.0–0.5)
Eosinophils Relative: 5 %
HCT: 42.1 % (ref 39.0–52.0)
Hemoglobin: 13.5 g/dL (ref 13.0–17.0)
Immature Granulocytes: 1 %
Lymphocytes Relative: 3 %
Lymphs Abs: 0.1 10*3/uL — ABNORMAL LOW (ref 0.7–4.0)
MCH: 31.2 pg (ref 26.0–34.0)
MCHC: 32.1 g/dL (ref 30.0–36.0)
MCV: 97.2 fL (ref 80.0–100.0)
Monocytes Absolute: 0.4 10*3/uL (ref 0.1–1.0)
Monocytes Relative: 11 %
Neutro Abs: 3 10*3/uL (ref 1.7–7.7)
Neutrophils Relative %: 79 %
Platelet Count: 85 10*3/uL — ABNORMAL LOW (ref 150–400)
RBC: 4.33 MIL/uL (ref 4.22–5.81)
RDW: 12.6 % (ref 11.5–15.5)
WBC Count: 3.8 10*3/uL — ABNORMAL LOW (ref 4.0–10.5)
nRBC: 0 % (ref 0.0–0.2)

## 2021-06-17 LAB — CMP (CANCER CENTER ONLY)
ALT: 7 U/L (ref 0–44)
AST: 10 U/L — ABNORMAL LOW (ref 15–41)
Albumin: 3.6 g/dL (ref 3.5–5.0)
Alkaline Phosphatase: 84 U/L (ref 38–126)
Anion gap: 8 (ref 5–15)
BUN: 17 mg/dL (ref 8–23)
CO2: 22 mmol/L (ref 22–32)
Calcium: 9.1 mg/dL (ref 8.9–10.3)
Chloride: 109 mmol/L (ref 98–111)
Creatinine: 1.06 mg/dL (ref 0.61–1.24)
GFR, Estimated: >60 mL/min (ref >60)
Glucose, Bld: 94 mg/dL (ref 70–99)
Potassium: 4.5 mmol/L (ref 3.5–5.1)
Sodium: 139 mmol/L (ref 135–145)
Total Bilirubin: 0.3 mg/dL (ref 0.3–1.2)
Total Protein: 6.1 g/dL — ABNORMAL LOW (ref 6.5–8.1)

## 2021-06-17 LAB — TSH: TSH: 0.491 u[IU]/mL (ref 0.320–4.118)

## 2021-06-17 MED ORDER — SODIUM CHLORIDE 0.9% FLUSH
10.0000 mL | INTRAVENOUS | Status: DC | PRN
  Administered 2021-06-17: 10 mL

## 2021-06-17 MED ORDER — HEPARIN SOD (PORK) LOCK FLUSH 100 UNIT/ML IV SOLN
500.0000 [IU] | Freq: Once | INTRAVENOUS | Status: AC | PRN
  Administered 2021-06-17: 500 [IU]

## 2021-06-17 MED ORDER — SODIUM CHLORIDE 0.9% FLUSH
10.0000 mL | Freq: Once | INTRAVENOUS | Status: AC
  Administered 2021-06-17: 10 mL

## 2021-06-17 MED ORDER — SODIUM CHLORIDE 0.9 % IV SOLN: Freq: Once | INTRAVENOUS | Status: AC

## 2021-06-17 MED ORDER — SODIUM CHLORIDE 0.9 % IV SOLN
1500.0000 mg | Freq: Once | INTRAVENOUS | Status: AC
  Administered 2021-06-17: 1500 mg via INTRAVENOUS
  Filled 2021-06-17: qty 30

## 2021-06-17 NOTE — Progress Notes (Signed)
I was able to see Mr and Ms. Querry today during their visit to see Dr. Julien Nordmann.  Patient is not feeling great today and Dr. Julien Nordmann is aware and ordered scans to see how patient is responding to therapy.  No barriers at this time.

## 2021-06-17 NOTE — Progress Notes (Signed)
Per Dr Julien Nordmann, okay to treat with PLTs 85.

## 2021-06-17 NOTE — Progress Notes (Signed)
[facility] [phone]) [phone]   [phone]  OFFICE PROGRESS NOTE  [name], MD [address] [place] [address]  DIAGNOSIS: extensive stage (T2 a, N2, M1c) small cell lung cancer presented with right hilar mass in addition to mediastinal lymphadenopathy as well as metastatic disease to the bone with complete replacement of the L5 status post laminectomy with resection of epidural tumor as well as metastatic disease to the brain, bones and bilateral adrenal glands diagnosed in January 2022.   PRIOR THERAPY:  1) Status post palliative radiotherapy to the resected area at L5 under the care of Dr. Lisbeth Renshaw. 2) whole brain irradiation under the care of Dr. Lisbeth Renshaw. 3) palliative radiotherapy to the enlarging bilateral adrenal gland metastasis under the care of Dr. Lisbeth Renshaw last fraction June 20, 2021.  CURRENT THERAPY: Systemic chemotherapy with carboplatin for AUC of 5 from day 1, etoposide 100 mg/M2 on days 1, 2 and 3 with Cosela 240 mg/M2 before chemotherapy in addition to Imfinzi 1500 mg IV every 3 weeks with day one of the chemotherapy.  First dose December 24, 2020.  Status post 7  cycles.  Starting from cycle #5 the patient will be on maintenance treatment with Imfinzi every 4 weeks.  INTERVAL HISTORY: Gregory Hodges 80 y.o. male returns to the clinic today for follow-up visit.  The patient is feeling fine today with no concerning complaints.  He has been tolerating his palliative radiotherapy to the brain and the adrenal glands fairly well.  He denied having any current chest pain, shortness of breath, cough or hemoptysis.  He has no recent weight loss or night sweats.  He has no nausea, vomiting, diarrhea or constipation.  He has no headache or visual changes.  He is expected to complete the course of palliative radiotherapy on June 20, 2021.  The patient is here today for evaluation before starting cycle #8 of his treatment with  Imfinzi.   MEDICAL HISTORY: Past Medical History:  Diagnosis Date   AAA (abdominal aortic aneurysm) ([facility])    s/p open repair using aortobifemoral graft 03/03/10 ([facility]), complicated by wound dehisence, enterocutaneous fistula; developed aortic graft infection s/p explant of graft and placement of bilateral axillofemoral grafts 07/22/10 [facility])   Cancer ([facility])    Skin   Crohn's disease ([facility])    E coli bacteremia    History of kidney stones    Hyperlipemia    Hypertension    "denies"   Myocardial infarction ([facility]) 08/11/2005   s/p Horizon study stent D1   Peripheral vascular disease ([place]) June 2011   SCLC (small cell lung carcinoma) ([facility]) dx'd 10/2020   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  MEDICATIONS:  Current Outpatient Medications  Medication Sig Dispense Refill   acetaminophen (TYLENOL) 500 MG tablet Take 500 mg by mouth every 6 (six) hours as needed for moderate pain.     aspirin 81 MG tablet Take 81 mg by mouth daily.     atenolol (TENORMIN) 25 MG tablet Take 25 mg by mouth 2 (two) times daily.     calcium carbonate (TUMS - DOSED IN MG ELEMENTAL CALCIUM) 500 MG chewable tablet Chew 2 tablets by mouth daily as needed for indigestion or heartburn.     diphenhydrAMINE (BENADRYL) 25 MG tablet Take 50 mg by mouth daily as needed for allergies.     ferrous sulfate 325 (65 FE) MG tablet  Take 325 mg by mouth every Monday, Wednesday, and Friday.     lidocaine-prilocaine (EMLA) cream Apply to the Port-A-Cath site 30-60 minutes before treatment 30 g 0   nitroGLYCERIN (NITROSTAT) 0.4 MG SL tablet Place 0.4 mg under the tongue every 5 (five) minutes as needed for chest pain.     omeprazole (PRILOSEC) 10 MG capsule Take 10 mg by mouth daily.     ondansetron (ZOFRAN) 8 MG tablet Take 1 tablet (8 mg total) by mouth every 8 (eight) hours as needed for nausea or vomiting. 20 tablet 0   oxymetazoline (AFRIN) 0.05 % nasal  spray Place 1 spray into both nostrils 2 (two) times daily as needed for congestion.     prochlorperazine (COMPAZINE) 10 MG tablet Take 1 tablet (10 mg total) by mouth every 6 (six) hours as needed for nausea or vomiting. (Patient not taking: Reported on 05/29/2021) 30 tablet 0   rosuvastatin (CRESTOR) 10 MG tablet Take 10 mg by mouth daily.     No current facility-administered medications for this visit.    SURGICAL HISTORY:  Past Surgical History:  Procedure Laterality Date   ABDOMINAL AORTIC ANEURYSM REPAIR  03/03/2010   AXILLARY-FEMORAL BYPASS GRAFT  07/22/2010   bilateral   bowel     herniated bowel   CARDIAC CATHETERIZATION     CORONARY STENT PLACEMENT     CYSTOSCOPY     IR IMAGING GUIDED PORT INSERTION  12/27/2020   LAMINECTOMY N/A 10/22/2020   Procedure: Lumbar five Open Laminectomy for tumor resection;  Surgeon: Judith Part, MD;  Location: [facility];  Service: Neurosurgery;  Laterality: N/A;   MOHS SURGERY     several     REVIEW OF SYSTEMS:  Constitutional: positive for fatigue Eyes: negative Ears, nose, mouth, throat, and face: negative Respiratory: positive for dyspnea on exertion Cardiovascular: negative Gastrointestinal: negative Genitourinary:negative Integument/breast: negative Hematologic/lymphatic: negative Musculoskeletal:negative Neurological: negative Behavioral/Psych: negative Endocrine: negative Allergic/Immunologic: negative   PHYSICAL EXAMINATION: General appearance: alert, cooperative, fatigued, and no distress Head: Normocephalic, without obvious abnormality, atraumatic Neck: no adenopathy, no JVD, supple, symmetrical, trachea midline, and thyroid not enlarged, symmetric, no tenderness/mass/nodules Lymph nodes: Cervical, supraclavicular, and axillary nodes normal. Resp: clear to auscultation bilaterally Back: symmetric, no curvature. ROM normal. No CVA tenderness. Cardio: regular rate and rhythm, S1, S2 normal, no murmur, click, rub or  gallop GI: soft, non-tender; bowel sounds normal; no masses,  no organomegaly Extremities: extremities normal, atraumatic, no cyanosis or edema Neurologic: Alert and oriented X 3, normal strength and tone. Normal symmetric reflexes. Normal coordination and gait  ECOG PERFORMANCE STATUS: 1 - Symptomatic but completely ambulatory  Blood pressure 105/66, pulse 60, temperature (!) 97.4 F (36.3 C), temperature source Tympanic, resp. rate 17, weight 166 lb 12.8 oz (75.7 kg), SpO2 98 %.  LABORATORY DATA: Lab Results  Component Value Date   WBC 3.8 (L) 06/17/2021   HGB 13.5 06/17/2021   HCT 42.1 06/17/2021   MCV 97.2 06/17/2021   PLT 85 (L) 06/17/2021      Chemistry      Component Value Date/Time   NA 140 05/20/2021 1005   K 4.8 05/20/2021 1005   CL 111 05/20/2021 1005   CO2 22 05/20/2021 1005   BUN 19 05/20/2021 1005   CREATININE 1.04 05/20/2021 1005      Component Value Date/Time   CALCIUM 8.8 (L) 05/20/2021 1005   ALKPHOS 80 05/20/2021 1005   AST 9 (L) 05/20/2021 1005   ALT <6 05/20/2021 1005   BILITOT  0.3 05/20/2021 1005       RADIOGRAPHIC STUDIES: CT Chest W Contrast  Result Date: 05/26/2021 CLINICAL DATA:  Extensive stage small-cell lung cancer. EXAM: CT CHEST, ABDOMEN, AND PELVIS WITH CONTRAST TECHNIQUE: Multidetector CT imaging of the chest, abdomen and pelvis was performed following the standard protocol during bolus administration of intravenous contrast. CONTRAST:  69m OMNIPAQUE IOHEXOL 350 MG/ML SOLN COMPARISON:  CT chest abdomen and pelvis dated June 2022 FINDINGS: CT CHEST FINDINGS Cardiovascular: Cardiomegaly. Right chest wall port with tip in the lower SVC. No pericardial effusion. Three-vessel calcifications effusion atherosclerotic disease of the thoracic aorta with ulcerated soft plaque of the descending thoracic aorta unchanged compared to exam no suspicious filling defects of the central pulmonary arteries. Mediastinum/Nodes: No pathologically enlarged lymph  nodes seen in chest. Large hiatal hernia. Thyroid is unremarkable. Lungs/Pleura: Central airways are patent. No new or enlarging pulmonary nodules. Musculoskeletal: Unchanged lytic and sclerotic lesion of the right posterior rib with adjacent fracture. Unchanged lytic and sclerotic lesion the lateral left rib. CT ABDOMEN PELVIS FINDINGS Hepatobiliary: Low-attenuation lesions of the right hepatic lobe which were new on prior exam are decreased in size. Reference lesion of the right lobe located on series 2, image 59 measures 5 mm, previously 11 mm. The previously seen scattered small liver lesions are unchanged. Atrophy of the left hepatic lobe with mild intrahepatic biliary ductal dilation. Dilated common bile duct, unchanged compared to prior. Pancreas: Unremarkable. No pancreatic ductal dilatation or surrounding inflammatory changes. Spleen: Normal in size without focal abnormality. Adrenals/Urinary Tract: Increased size of left adrenal nodule, measuring 2.9 x 1.5 cm previously 2.1 x 1.1 cm. Increased size of right adrenal nodule, measuring up to 1.5 cm previously 0.8 cm. Kidneys enhance symmetrically with no evidence of hydronephrosis. Bilateral nonobstructing renal stones. Simple cyst of the left kidney. Additional bilateral low-density renal lesions are seen which are too small to completely characterize. Stomach/Bowel: Stomach is within normal limits. Appendix appears normal. 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 No evidence of bowel wall thickening, distention, or inflammatory changes. 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 femoral bypass grafting. Reproductive: Calcifications of the prostate. Other: Moderate fat containing right inguinal hernia. Large ventral abdominal wall hernia containing nondilated loops bowel. Musculoskeletal: Unchanged sclerotic lesion of the anterior right acetabulum. Unchanged  sclerosis and height loss of the L5 vertebral body. IMPRESSION: Bilateral adrenal nodules are increased in size when compared with most recent prior CT, concerning for progressive disease. Low-attenuation lesions of the right hepatic lobe which were new on prior exam are decreased in size. Unchanged osseous metastatic disease. Aortic Atherosclerosis (ICD10-I70.0) and Emphysema (ICD10-J43.9). Electronically Signed   By: LYetta GlassmanM.D.   On: 05/26/2021 15:58   MR Brain W Wo Contrast  Result Date: 05/26/2021 CLINICAL DATA:  Staging of small cell lung carcinoma EXAM: MRI HEAD WITHOUT AND WITH CONTRAST TECHNIQUE: Multiplanar, multiecho pulse sequences of the brain and surrounding structures were obtained without and with intravenous contrast. CONTRAST:  829mGADAVIST GADOBUTROL 1 MMOL/ML IV SOLN COMPARISON:  02/10/2021 FINDINGS: Brain: No acute infarct, mass effect or extra-axial collection. Chronic blood products in the right cerebellum and left temporal lobe. Hyperintense T2-weighted signal is moderately widespread throughout the white matter. Generalized volume loss without a clear lobar predilection. There are multiple contrast-enhancing lesions: 1. 1.5 cm right cerebellum, image 37, increased in size 2. 9 mm medial right temporal lobe, image 65, new Vascular: Major flow voids are  preserved. Skull and upper cervical spine: Normal calvarium and skull base. Visualized upper cervical spine and soft tissues are normal. Sinuses/Orbits:No paranasal sinus fluid levels or advanced mucosal thickening. No mastoid or middle ear effusion. Normal orbits. IMPRESSION: 1. Larger right cerebellar and new medial right temporal lobe metastases. 2. No acute intracranial abnormality. 3. Chronic microvascular ischemia and volume loss. Electronically Signed   By: Ulyses Jarred M.D.   On: 05/26/2021 21:31   CT Abdomen Pelvis W Contrast  Result Date: 05/26/2021 CLINICAL DATA:  Extensive stage small-cell lung cancer. EXAM: CT  CHEST, ABDOMEN, AND PELVIS WITH CONTRAST TECHNIQUE: Multidetector CT imaging of the chest, abdomen and pelvis was performed following the standard protocol during bolus administration of intravenous contrast. CONTRAST:  72m OMNIPAQUE IOHEXOL 350 MG/ML SOLN COMPARISON:  CT chest abdomen and pelvis dated June 2022 FINDINGS: CT CHEST FINDINGS Cardiovascular: Cardiomegaly. Right chest wall port with tip in the lower SVC. No pericardial effusion. Three-vessel calcifications effusion atherosclerotic disease of the thoracic aorta with ulcerated soft plaque of the descending thoracic aorta unchanged compared to exam no suspicious filling defects of the central pulmonary arteries. Mediastinum/Nodes: No pathologically enlarged lymph nodes seen in chest. Large hiatal hernia. Thyroid is unremarkable. Lungs/Pleura: Central airways are patent. No new or enlarging pulmonary nodules. Musculoskeletal: Unchanged lytic and sclerotic lesion of the right posterior rib with adjacent fracture. Unchanged lytic and sclerotic lesion the lateral left rib. CT ABDOMEN PELVIS FINDINGS Hepatobiliary: Low-attenuation lesions of the right hepatic lobe which were new on prior exam are decreased in size. Reference lesion of the right lobe located on series 2, image 59 measures 5 mm, previously 11 mm. The previously seen scattered small liver lesions are unchanged. Atrophy of the left hepatic lobe with mild intrahepatic biliary ductal dilation. Dilated common bile duct, unchanged compared to prior. Pancreas: Unremarkable. No pancreatic ductal dilatation or surrounding inflammatory changes. Spleen: Normal in size without focal abnormality. Adrenals/Urinary Tract: Increased size of left adrenal nodule, measuring 2.9 x 1.5 cm previously 2.1 x 1.1 cm. Increased size of right adrenal nodule, measuring up to 1.5 cm previously 0.8 cm. Kidneys enhance symmetrically with no evidence of hydronephrosis. Bilateral nonobstructing renal stones. Simple cyst of the  left kidney. Additional bilateral low-density renal lesions are seen which are too small to completely characterize. Stomach/Bowel: Stomach is within normal limits. Appendix appears normal. 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 No evidence of bowel wall thickening, distention, or inflammatory changes. 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 femoral bypass grafting. Reproductive: Calcifications of the prostate. Other: Moderate fat containing right inguinal hernia. Large ventral abdominal wall hernia containing nondilated loops bowel. Musculoskeletal: Unchanged sclerotic lesion of the anterior right acetabulum. Unchanged sclerosis and height loss of the L5 vertebral body. IMPRESSION: Bilateral adrenal nodules are increased in size when compared with most recent prior CT, concerning for progressive disease. Low-attenuation lesions of the right hepatic lobe which were new on prior exam are decreased in size. Unchanged osseous metastatic disease. Aortic Atherosclerosis (ICD10-I70.0) and Emphysema (ICD10-J43.9). Electronically Signed   By: LYetta GlassmanM.D.   On: 05/26/2021 15:58     ASSESSMENT AND PLAN: This is a very pleasant 80 years old white male recently diagnosed with extensive stage (T2 a, N2, M1 C) small cell lung cancer presented with right upper lobe lesion with extensive mediastinal and right hilar adenopathy in addition to extensive bone metastasis as well as metastatic disease to the adrenal  gland bilaterally and multiple brain lesions diagnosed in February 2022 status post resection of the epidural tumor at the L5 as well as palliative radiotherapy to the lesion after resection. The patient is currently undergoing systemic chemotherapy with carboplatin for AUC of 5 on day 1, etoposide 100 Mg/M2 on days 1, 2 and 3 with Cosela on the days of the chemotherapy as well as Imfinzi every 3 weeks.   Status post 7 cycles.  Starting from cycle #5 the patient is on maintenance treatment with Imfinzi 1500 Mg IV every 4 weeks. The patient has been tolerating his maintenance treatment with Imfinzi fairly well with no concerning adverse effects. The patient recently underwent whole brain irradiation as well as palliative radiotherapy to the bilateral adrenal gland lesions under the care of Dr. Lisbeth Renshaw. He is tolerating this palliative radiotherapy fairly well. I recommended for him to proceed with cycle #8 of his maintenance treatment with Imfinzi today as planned. The patient will come back for follow-up visit in 4 weeks for evaluation before the next cycle of his treatment. He was advised to call immediately if he has any concerning symptoms in the interval. All questions were answered. The patient knows to call the clinic with any problems, questions or concerns. We can certainly see the patient much sooner if necessary.   Disclaimer: This note was dictated with voice recognition software. Similar sounding words can inadvertently be transcribed and may not be corrected upon review.

## 2021-06-17 NOTE — Progress Notes (Signed)
Nutrition Follow-up:  Patient currently receiving palliative radiotherapy and immunotherapy with Imfinzi for metastatic small cell lung cancer.   Met with patient during infusion. He reports appetite is good and is eating well. Yesterday he had egg, watermelon, cantaloupe, wheat toast for breakfast, ham/cheese sandwich, chips, slice apples for lunch, and split a shrimp/bacon pizza with his wife for dinner. Patient reports he has been going to the health club 3 days/week. Patient denies nutrition impact symptoms.   Medications: reviewed  Labs: reviewed  Anthropometrics: Weight 166 lb 12.8 oz today increased from 163 lb 4.8 oz on 8/23 and 161 lb on 7/26   NUTRITION DIAGNOSIS: Food and nutrition knowledge related deficit improved   INTERVENTION:  Continue eating high calorie, high protein foods for weight maintenance Encouraged activity as able Patient has contact information    MONITORING, EVALUATION, GOAL: weight trends, intake   NEXT VISIT: To be scheduled as needed

## 2021-06-17 NOTE — Patient Instructions (Signed)
[facility]  Discharge Instructions: Thank you for choosing [facility] to provide your oncology and hematology care.   If you have a lab appointment with the [facility], please go directly to the [facility] and check in at the registration area.   Wear comfortable clothing and clothing appropriate for easy access to any Portacath or PICC line.   We strive to give you quality time with your provider. You may need to reschedule your appointment if you arrive late (15 or more minutes).  Arriving late affects you and other patients whose appointments are after yours.  Also, if you miss three or more appointments without notifying the office, you may be dismissed from the clinic at the provider's discretion.      For prescription refill requests, have your pharmacy contact our office and allow 72 hours for refills to be completed.    Today you received the following chemotherapy and/or immunotherapy agents: Imfinzi (durvalumab)      To help prevent nausea and vomiting after your treatment, we encourage you to take your nausea medication as directed.  BELOW ARE SYMPTOMS THAT SHOULD BE REPORTED IMMEDIATELY: *FEVER GREATER THAN 100.4 F (38 C) OR HIGHER *CHILLS OR SWEATING *NAUSEA AND VOMITING THAT IS NOT CONTROLLED WITH YOUR NAUSEA MEDICATION *UNUSUAL SHORTNESS OF BREATH *UNUSUAL BRUISING OR BLEEDING *URINARY PROBLEMS (pain or burning when urinating, or frequent urination) *BOWEL PROBLEMS (unusual diarrhea, constipation, pain near the anus) TENDERNESS IN MOUTH AND THROAT WITH OR WITHOUT PRESENCE OF ULCERS (sore throat, sores in mouth, or a toothache) UNUSUAL RASH, SWELLING OR PAIN  UNUSUAL VAGINAL DISCHARGE OR ITCHING   Items with * indicate a potential emergency and should be followed up as soon as possible or go to the Emergency Department if any problems should occur.  Please show the CHEMOTHERAPY ALERT CARD or IMMUNOTHERAPY ALERT CARD at  check-in to the Emergency Department and triage nurse.  Should you have questions after your visit or need to cancel or reschedule your appointment, please contact [facility]  Dept: [phone]  and follow the prompts.  Office hours are 8:00 a.m. to 4:30 p.m. Monday - Friday. Please note that voicemails left after 4:00 p.m. may not be returned until the following business day.  We are closed weekends and major holidays. You have access to a nurse at all times for urgent questions. Please call the main number to the clinic Dept: [phone] and follow the prompts.   For any non-urgent questions, you may also contact your provider using MyChart. We now offer e-Visits for anyone 16 and older to request care online for non-urgent symptoms. For details visit [email or web address].   Also download the MyChart app! Go to the app store, search "MyChart", open the app, select [facility], and log in with your MyChart username and password.  Due to Covid, a mask is required upon entering the hospital/clinic. If you do not have a mask, one will be given to you upon arrival. For doctor visits, patients may have 1 support person aged 30 or older with them. For treatment visits, patients cannot have anyone with them due to current Covid guidelines and our immunocompromised population.

## 2021-06-18 ENCOUNTER — Ambulatory Visit
Admission: RE | Admit: 2021-06-18 | Discharge: 2021-06-18 | Disposition: A | Discharge status: Home / Self Care | Payer: PPO | Source: Ambulatory Visit | Attending: Radiation Oncology | Admitting: Radiation Oncology

## 2021-06-18 ENCOUNTER — Other Ambulatory Visit: Payer: Self-pay

## 2021-06-18 DIAGNOSIS — Z510 Encounter for antineoplastic radiation therapy: Secondary | ICD-10-CM | POA: Diagnosis not present

## 2021-06-18 DIAGNOSIS — C3491 Malignant neoplasm of unspecified part of right bronchus or lung: Secondary | ICD-10-CM | POA: Diagnosis not present

## 2021-06-18 DIAGNOSIS — C7931 Secondary malignant neoplasm of brain: Secondary | ICD-10-CM | POA: Diagnosis not present

## 2021-06-18 DIAGNOSIS — C7972 Secondary malignant neoplasm of left adrenal gland: Secondary | ICD-10-CM | POA: Diagnosis not present

## 2021-06-18 DIAGNOSIS — C7971 Secondary malignant neoplasm of right adrenal gland: Secondary | ICD-10-CM | POA: Diagnosis not present

## 2021-06-19 ENCOUNTER — Ambulatory Visit
Admission: RE | Admit: 2021-06-19 | Discharge: 2021-06-19 | Disposition: A | Discharge status: Home / Self Care | Payer: PPO | Source: Ambulatory Visit | Attending: Radiation Oncology | Admitting: Radiation Oncology

## 2021-06-19 DIAGNOSIS — C7931 Secondary malignant neoplasm of brain: Secondary | ICD-10-CM | POA: Diagnosis not present

## 2021-06-19 DIAGNOSIS — C3491 Malignant neoplasm of unspecified part of right bronchus or lung: Secondary | ICD-10-CM | POA: Diagnosis not present

## 2021-06-19 DIAGNOSIS — Z510 Encounter for antineoplastic radiation therapy: Secondary | ICD-10-CM | POA: Diagnosis not present

## 2021-06-19 DIAGNOSIS — C7972 Secondary malignant neoplasm of left adrenal gland: Secondary | ICD-10-CM | POA: Diagnosis not present

## 2021-06-19 DIAGNOSIS — C7971 Secondary malignant neoplasm of right adrenal gland: Secondary | ICD-10-CM | POA: Diagnosis not present

## 2021-06-20 ENCOUNTER — Ambulatory Visit
Admission: RE | Admit: 2021-06-20 | Discharge: 2021-06-20 | Disposition: A | Discharge status: Home / Self Care | Payer: PPO | Source: Ambulatory Visit | Attending: Radiation Oncology | Admitting: Radiation Oncology

## 2021-06-20 ENCOUNTER — Encounter: Payer: Self-pay | Admitting: Radiation Oncology

## 2021-06-20 ENCOUNTER — Other Ambulatory Visit: Payer: Self-pay

## 2021-06-20 DIAGNOSIS — C7931 Secondary malignant neoplasm of brain: Secondary | ICD-10-CM | POA: Diagnosis not present

## 2021-06-20 DIAGNOSIS — Z510 Encounter for antineoplastic radiation therapy: Secondary | ICD-10-CM | POA: Diagnosis not present

## 2021-06-20 DIAGNOSIS — C3491 Malignant neoplasm of unspecified part of right bronchus or lung: Secondary | ICD-10-CM | POA: Diagnosis not present

## 2021-06-20 DIAGNOSIS — C7972 Secondary malignant neoplasm of left adrenal gland: Secondary | ICD-10-CM | POA: Diagnosis not present

## 2021-06-20 DIAGNOSIS — C7971 Secondary malignant neoplasm of right adrenal gland: Secondary | ICD-10-CM | POA: Diagnosis not present

## 2021-06-30 ENCOUNTER — Encounter: Payer: Self-pay | Admitting: Internal Medicine

## 2021-06-30 NOTE — Progress Notes (Signed)
                                                                                                                                                             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  Intent: Palliative  Radiation Treatment Dates: 06/09/2021 through 06/20/2021 Site Technique Total Dose (Gy) Dose per Fx (Gy) Completed Fx Beam Energies  Brain:  Whole Brain Complex 30/30 3 10/10 6X  Abdomen:  Bilateral Adrenal Glands 3D 30/30 3 10/10 10X, 15X   Narrative: The patient tolerated radiation therapy relatively well. No complaints of side effects were noted at the completion of therapy. He decided to forgo Namenda with his whole brain regimen.  Plan: The patient will receive a call in about one month from the radiation oncology department. He will continue follow up with Dr. Julien Nordmann as well.   ________________________________________________    Carola Rhine, [facility]

## 2021-07-07 NOTE — Progress Notes (Deleted)
[facility] OFFICE PROGRESS NOTE  [name], MD [address] [place] [address]  DIAGNOSIS: ***  PRIOR THERAPY:  CURRENT THERAPY:  INTERVAL HISTORY: Gregory Hodges 80 y.o. male returns for *** regular *** visit for followup of ***   MEDICAL HISTORY: Past Medical History:  Diagnosis Date   AAA (abdominal aortic aneurysm) ([place])    s/p open repair using aortobifemoral graft 03/03/10 ([facility]), complicated by wound dehisence, enterocutaneous fistula; developed aortic graft infection s/p explant of graft and placement of bilateral axillofemoral grafts 07/22/10 [facility])   Cancer ([place])    Skin   Crohn's disease ([place])    E coli bacteremia    History of kidney stones    Hyperlipemia    Hypertension    "denies"   Myocardial infarction ([place]) 08/11/2005   s/p Horizon study stent D1   Peripheral vascular disease ([place]) June 2011   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  MEDICATIONS:  Current Outpatient Medications  Medication Sig Dispense Refill   acetaminophen (TYLENOL) 500 MG tablet Take 500 mg by mouth every 6 (six) hours as needed for moderate pain.     aspirin 81 MG tablet Take 81 mg by mouth daily.     atenolol (TENORMIN) 25 MG tablet Take 25 mg by mouth 2 (two) times daily.     calcium carbonate (TUMS - DOSED IN MG ELEMENTAL CALCIUM) 500 MG chewable tablet Chew 2 tablets by mouth daily as needed for indigestion or heartburn.     diphenhydrAMINE (BENADRYL) 25 MG tablet Take 50 mg by mouth daily as needed for allergies.     ferrous sulfate 325 (65 FE) MG tablet Take 325 mg by mouth every Monday, Wednesday, and Friday.     lidocaine-prilocaine (EMLA) cream Apply to the Port-A-Cath site 30-60 minutes before treatment (Patient not taking: Reported on 06/17/2021) 30 g 0   nitroGLYCERIN (NITROSTAT) 0.4 MG SL tablet Place 0.4  mg under the tongue every 5 (five) minutes as needed for chest pain.     omeprazole (PRILOSEC) 10 MG capsule Take 10 mg by mouth daily.     ondansetron (ZOFRAN) 8 MG tablet Take 1 tablet (8 mg total) by mouth every 8 (eight) hours as needed for nausea or vomiting. 20 tablet 0   oxymetazoline (AFRIN) 0.05 % nasal spray Place 1 spray into both nostrils 2 (two) times daily as needed for congestion.     prochlorperazine (COMPAZINE) 10 MG tablet Take 1 tablet (10 mg total) by mouth every 6 (six) hours as needed for nausea or vomiting. 30 tablet 0   rosuvastatin (CRESTOR) 10 MG tablet Take 10 mg by mouth daily.     No current facility-administered medications for this visit.    SURGICAL HISTORY:  Past Surgical History:  Procedure Laterality Date   ABDOMINAL AORTIC ANEURYSM REPAIR  03/03/2010   AXILLARY-FEMORAL BYPASS GRAFT  07/22/2010   bilateral   bowel     herniated bowel   CARDIAC CATHETERIZATION     CORONARY STENT PLACEMENT     CYSTOSCOPY     IR IMAGING GUIDED PORT INSERTION  12/27/2020   LAMINECTOMY N/A 10/22/2020   Procedure: Lumbar five Open Laminectomy for tumor resection;  Surgeon: Judith Part, MD;  Location: [facility];  Service: Neurosurgery;  Laterality: N/A;   MOHS SURGERY     several  REVIEW OF SYSTEMS:   Review of Systems  Constitutional: Negative for appetite change, chills, fatigue, fever and unexpected weight change.  HENT:   Negative for mouth sores, nosebleeds, sore throat and trouble swallowing.   Eyes: Negative for eye problems and icterus.  Respiratory: Negative for cough, hemoptysis, shortness of breath and wheezing.   Cardiovascular: Negative for chest pain and leg swelling.  Gastrointestinal: Negative for abdominal pain, constipation, diarrhea, nausea and vomiting.  Genitourinary: Negative for bladder incontinence, difficulty urinating, dysuria, frequency and hematuria.   Musculoskeletal: Negative for back pain, gait problem, neck pain and neck stiffness.   Skin: Negative for itching and rash.  Neurological: Negative for dizziness, extremity weakness, gait problem, headaches, light-headedness and seizures.  Hematological: Negative for adenopathy. Does not bruise/bleed easily.  Psychiatric/Behavioral: Negative for confusion, depression and sleep disturbance. The patient is not nervous/anxious.     PHYSICAL EXAMINATION:  There were no vitals taken for this visit.  ECOG PERFORMANCE STATUS: {CHL ONC ECOG [id number]  Physical Exam  Constitutional: Oriented to person, place, and time and well-developed, well-nourished, and in no distress. No distress.  HENT:  Head: Normocephalic and atraumatic.  Mouth/Throat: Oropharynx is clear and moist. No oropharyngeal exudate.  Eyes: Conjunctivae are normal. Right eye exhibits no discharge. Left eye exhibits no discharge. No scleral icterus.  Neck: Normal range of motion. Neck supple.  Cardiovascular: Normal rate, regular rhythm, normal heart sounds and intact distal pulses.   Pulmonary/Chest: Effort normal and breath sounds normal. No respiratory distress. No wheezes. No rales.  Abdominal: Soft. Bowel sounds are normal. Exhibits no distension and no mass. There is no tenderness.  Musculoskeletal: Normal range of motion. Exhibits no edema.  Lymphadenopathy:    No cervical adenopathy.  Neurological: Alert and oriented to person, place, and time. Exhibits normal muscle tone. Gait normal. Coordination normal.  Skin: Skin is warm and dry. No rash noted. Not diaphoretic. No erythema. No pallor.  Psychiatric: Mood, memory and judgment normal.  Vitals reviewed.  LABORATORY DATA: Lab Results  Component Value Date   WBC 3.8 (L) 06/17/2021   HGB 13.5 06/17/2021   HCT 42.1 06/17/2021   MCV 97.2 06/17/2021   PLT 85 (L) 06/17/2021      Chemistry      Component Value Date/Time   NA 139 06/17/2021 0843   K 4.5 06/17/2021 0843   CL 109 06/17/2021 0843   CO2 22 06/17/2021 0843   BUN 17 06/17/2021 0843    CREATININE 1.06 06/17/2021 0843      Component Value Date/Time   CALCIUM 9.1 06/17/2021 0843   ALKPHOS 84 06/17/2021 0843   AST 10 (L) 06/17/2021 0843   ALT 7 06/17/2021 0843   BILITOT 0.3 06/17/2021 0843       RADIOGRAPHIC STUDIES:  No results found.   ASSESSMENT/PLAN:  No problem-specific Assessment & Plan notes found for this encounter.   No orders of the defined types were placed in this encounter.    I spent {CHL ONC TIME VISIT - TRVUY:[id number]} counseling the patient face to face. The total time spent in the appointment was {CHL ONC TIME VISIT - UOHFG:[id number]}.  Devone Bonilla L Aidenn Skellenger, PA-C 07/07/21

## 2021-07-07 NOTE — Progress Notes (Signed)
[facility] OFFICE PROGRESS NOTE  [name], MD [address] [place] [address]  DIAGNOSIS: Extensive stage (T2a, N2, M1 C) small cell lung cancer presented with right hilar mass in addition to mediastinal lymphadenopathy as well as metastatic disease to the bone with complete replacement of the L5 status post laminectomy with resection of epidural tumor as well as metastatic disease to the brain, bones and bilateral adrenal glands diagnosed in January 2022.   PRIOR THERAPY: 1) Status post palliative radiotherapy to the resected area at L5 under the care of Dr. Lisbeth Renshaw. 2) palliative radiotherapy to the right hip under the care of Dr. Lisbeth Renshaw last treatment on 01/09/2021. 2) whole brain radiation and radiation to the bilateral adrenal glands.  Completed on 06/20/2021 under the care of Dr. Lisbeth Renshaw.  CURRENT THERAPY: 1)  Systemic chemotherapy with carboplatin for AUC of 5 from day 1, etoposide 100 mg/M2 on days 1, 2 and 3 with Cosela 240 mg/M2 before chemotherapy in addition to Imfinzi 1500 mg IV every 3 weeks with day one of the chemotherapy.  First dose April 4th, 2022. Neulasta support was added starting with cycle #2. Status post 8 cycles. Starting from cycle #5, the patient began maintenance immunotherapy with Imfinzi.   INTERVAL HISTORY: Gregory Hodges 80 y.o. male returns to the clinic today for a follow-up visit.  The patient is feeling fair today without any concerning complaints except for fatigue following radiation treatment.  In the interval since his last appointment, he completed whole brain radiation as well as radiation to the bilateral adrenal glands. He fell a few days after his radiation due to the fatigue. Denies any injuries. He denies needing any other assistive devices at home.  He denies any recent fever, chills, or night sweats. He is cold natured. He denies any chest pain, cough, or hemoptysis.  Denies dyspnea on exertion because he does not  exerting himself. He lost weight since his last appointment but does not drink supplemental drinks because he does not like the consistency. Denies any nausea, vomiting, diarrhea, or constipation.  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 The patient is here today for repeat blood work and evaluation before starting cycle #9.    MEDICAL HISTORY: Past Medical History:  Diagnosis Date   AAA (abdominal aortic aneurysm) ([place])    s/p open repair using aortobifemoral graft 03/03/10 ([facility]), complicated by wound dehisence, enterocutaneous fistula; developed aortic graft infection s/p explant of graft and placement of bilateral axillofemoral grafts 07/22/10 [facility])   Cancer ([place])    Skin   Crohn's disease ([place])    E coli bacteremia    History of kidney stones    Hyperlipemia    Hypertension    "denies"   Myocardial infarction ([place]) 08/11/2005   s/p Horizon study stent D1   Peripheral vascular disease ([place]) June 2011   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  MEDICATIONS:  Current Outpatient Medications  Medication Sig Dispense Refill   acetaminophen (TYLENOL) 500 MG tablet Take 500 mg by mouth every 6 (six) hours as needed for moderate pain.     aspirin 81 MG tablet Take 81 mg by mouth daily.     atenolol (TENORMIN) 25 MG tablet Take 25 mg by mouth 2 (two) times daily.  calcium carbonate (TUMS - DOSED IN MG ELEMENTAL CALCIUM) 500 MG chewable tablet Chew 2 tablets by mouth daily as needed for indigestion or heartburn.     diphenhydrAMINE (BENADRYL) 25 MG tablet Take 50 mg by mouth daily as needed for allergies.     ferrous sulfate 325 (65 FE) MG tablet Take 325 mg by mouth every Monday, Wednesday, and Friday.     lidocaine-prilocaine (EMLA) cream Apply to the Port-A-Cath  site 30-60 minutes before treatment 30 g 0   nitroGLYCERIN (NITROSTAT) 0.4 MG SL tablet Place 0.4 mg under the tongue every 5 (five) minutes as needed for chest pain.     omeprazole (PRILOSEC) 10 MG capsule Take 10 mg by mouth daily.     ondansetron (ZOFRAN) 8 MG tablet Take 1 tablet (8 mg total) by mouth every 8 (eight) hours as needed for nausea or vomiting. 20 tablet 0   oxymetazoline (AFRIN) 0.05 % nasal spray Place 1 spray into both nostrils 2 (two) times daily as needed for congestion.     prochlorperazine (COMPAZINE) 10 MG tablet Take 1 tablet (10 mg total) by mouth every 6 (six) hours as needed for nausea or vomiting. 30 tablet 0   rosuvastatin (CRESTOR) 10 MG tablet Take 10 mg by mouth daily.     No current facility-administered medications for this visit.   Facility-Administered Medications Ordered in Other Visits  Medication Dose Route Frequency Provider Last Rate Last Admin   durvalumab (IMFINZI) 1,500 mg in sodium chloride 0.9 % 100 mL chemo infusion  1,500 mg Intravenous Once Curt Bears, MD        SURGICAL HISTORY:  Past Surgical History:  Procedure Laterality Date   ABDOMINAL AORTIC ANEURYSM REPAIR  03/03/2010   AXILLARY-FEMORAL BYPASS GRAFT  07/22/2010   bilateral   bowel     herniated bowel   CARDIAC CATHETERIZATION     CORONARY STENT PLACEMENT     CYSTOSCOPY     IR IMAGING GUIDED PORT INSERTION  12/27/2020   LAMINECTOMY N/A 10/22/2020   Procedure: Lumbar five Open Laminectomy for tumor resection;  Surgeon: Judith Part, MD;  Location: [facility];  Service: Neurosurgery;  Laterality: N/A;   MOHS SURGERY     several     REVIEW OF SYSTEMS:   Review of Systems  Constitutional: Positive for fatigue, appetite change, and weight loss. Negative for chills and fever.  HENT: Negative for mouth sores, nosebleeds, sore throat and trouble swallowing.   Eyes: Negative for eye problems and icterus.  Respiratory: Negative for cough, hemoptysis, shortness of breath and  wheezing.   Cardiovascular: Negative for chest pain and leg swelling.  Gastrointestinal: Negative for abdominal pain, constipation, diarrhea, nausea and vomiting.  Genitourinary: Negative for bladder incontinence, difficulty urinating, dysuria, frequency and hematuria.   Musculoskeletal: Negative for back pain, gait problem, neck pain and neck stiffness.  Skin: Negative for itching and rash.  Neurological: Negative for dizziness, extremity weakness, gait problem, headaches, light-headedness and seizures.  Hematological: Positive for senile purpura on upper extremities.  Negative for adenopathy.  Psychiatric/Behavioral: Negative for confusion, depression and sleep disturbance. The patient is not nervous/anxious.     PHYSICAL EXAMINATION:  Blood pressure (!) 102/58, pulse 66, temperature 98.6 F (37 C), temperature source Tympanic, resp. rate 17, weight 159 lb (72.1 kg), SpO2 94 %.  ECOG PERFORMANCE STATUS: 1-2  Physical Exam  Constitutional: Oriented to person, place, and time thin appearing male and in no distress. HENT: Head: Normocephalic and atraumatic. Mouth/Throat: Oropharynx is clear and  moist. No oropharyngeal exudate. Eyes: Conjunctivae are normal. Right eye exhibits no discharge. Left eye exhibits no discharge. No scleral icterus. Neck: Normal range of motion. Neck supple. Cardiovascular: Normal rate, regular rhythm, normal heart sounds and intact distal pulses.   Pulmonary/Chest: Effort normal. Wheezing noted bilaterally. No respiratory distress. No rales. Abdominal: Soft. Large hernia. Bowel sounds are normal. Exhibits no distension and no mass. There is no tenderness.  Musculoskeletal: Normal range of motion. Exhibits no edema.  Lymphadenopathy:    No cervical adenopathy.  Neurological: Alert and oriented to person, place, and time. Exhibits muscle wasting.  Examined in the wheelchair. Skin: Skin is warm and dry. No rash noted. Not diaphoretic. No erythema. No pallor.  Senile purpura noted on upper extremities bilaterally.  Psychiatric: Mood, memory and judgment normal. Vitals reviewed.  LABORATORY DATA: Lab Results  Component Value Date   WBC 3.7 (L) 07/16/2021   HGB 13.6 07/16/2021   HCT 41.1 07/16/2021   MCV 91.5 07/16/2021   PLT 74 (L) 07/16/2021      Chemistry      Component Value Date/Time   NA 140 07/16/2021 1049   K 4.8 07/16/2021 1049   CL 111 07/16/2021 1049   CO2 20 (L) 07/16/2021 1049   BUN 24 (H) 07/16/2021 1049   CREATININE 1.21 07/16/2021 1049      Component Value Date/Time   CALCIUM 9.1 07/16/2021 1049   ALKPHOS 76 07/16/2021 1049   AST 10 (L) 07/16/2021 1049   ALT 5 07/16/2021 1049   BILITOT 0.2 (L) 07/16/2021 1049       RADIOGRAPHIC STUDIES:  No results found.   ASSESSMENT/PLAN:  This is a very pleasant 80 year old Caucasian male diagnosed with extensive stage (T2a, N2, M1c) small cell lung cancer.  He presented with a right upper lobe lung lesion with extensive mediastinal and right hilar lymphadenopathy.  He also has extensive bone metastases as well as metastatic disease to the adrenal gland bilaterally and multiple brain lesions.  He was diagnosed in February 2022.     He is status post resection of the epidural tumor at L5.  He also completed palliative radiotherapy to this lesion after resection.   The patient also completed palliative radiotherapy to the painful right hip lesion in April 2022.   The patient is currently undergoing carboplatin for an AUC of 5, etoposide 100 mg per metered square, and Imfinzi 1500 mg IV every 3 weeks.  Starting from cycle #5, the patient was on single agent immunotherapy with Imfinzi IV every 4 weeks.  The patient completed whole brain radiation under the care of Dr. Lisbeth Renshaw.  He also completed radiation to the bilateral adrenal glands on 06/20/2021.  Labs were reviewed. His platelet count continues to be low, however, he is currently just on single agent maintenance  immunotherapy. Recommend that he proceed with cycle #9 today scheduled.  I will arrange for restaging CT scan the chest, abdomen, and pelvis prior to starting his neck cycle of treatment to restage his disease.   We will see him back for follow-up visit in 4 weeks for evaluation before starting cycle #10.  For his fatigue, he denies needing any additional assistive devices. I advised him to get a life alert in case he has falls in the future. He did not seem interested in this idea.   Regarding close monitoring of his brain metastases, I discussed with Dr. Julien Nordmann. We would be happy to follow the patient with routine brain MRIs if he does not wish  to follow with brain navigation program. Discussed the role of a multidisciplinary approach and explained that often times patient's require a multidisciplinary approach to treatment. He was agreeable to follow with the brain navigation program for now. If he changes his mind or refuses in the future, we will be happy to arrange for his follow up brain MRIs.   His blood pressure is a little bit low today. Offered IV hydration if inadequate oral intake. He would rather increase his hydration orally.   For his weight loss, I encouraged him to drink supplemental drinks. He does not like the consistency of boost/ensure. Encouraged him to create his own protein shakes in a blender if he does not like the products that are available.   The patient was advised to call immediately if he has any concerning symptoms in the interval. The patient voices understanding of current disease status and treatment options and is in agreement with the current care plan. All questions were answered. The patient knows to call the clinic with any problems, questions or concerns. We can certainly see the patient much sooner if necessary        Orders Placed This Encounter  Procedures   CT Abdomen Pelvis W Contrast    Standing Status:   Future    Standing Expiration  Date:   07/16/2022    Order Specific Question:   If indicated for the ordered procedure, I authorize the administration of contrast media per Radiology protocol    Answer:   Yes    Order Specific Question:   Preferred imaging location?    Answer:   [facility]    Order Specific Question:   Is Oral Contrast requested for this exam?    Answer:   Yes, Per Radiology protocol   CT Chest W Contrast    Standing Status:   Future    Standing Expiration Date:   07/16/2022    Order Specific Question:   If indicated for the ordered procedure, I authorize the administration of contrast media per Radiology protocol    Answer:   Yes    Order Specific Question:   Preferred imaging location?    Answer:   [facility]   TSH    Standing Status:   Standing    Number of Occurrences:   10    Standing Expiration Date:   07/16/2022     The total time spent in the appointment was 20-29 minutes.   Ayn Domangue L Kimberla Driskill, PA-C 07/16/21

## 2021-07-14 ENCOUNTER — Ambulatory Visit
Admission: RE | Admit: 2021-07-14 | Discharge: 2021-07-14 | Disposition: A | Discharge status: Home / Self Care | Payer: PPO | Source: Ambulatory Visit | Attending: Radiation Oncology | Admitting: Radiation Oncology

## 2021-07-14 DIAGNOSIS — C7931 Secondary malignant neoplasm of brain: Secondary | ICD-10-CM

## 2021-07-14 DIAGNOSIS — C3491 Malignant neoplasm of unspecified part of right bronchus or lung: Secondary | ICD-10-CM

## 2021-07-14 DIAGNOSIS — C7970 Secondary malignant neoplasm of unspecified adrenal gland: Secondary | ICD-10-CM

## 2021-07-14 NOTE — Progress Notes (Signed)
The patient called back and is interested in talking with Dr. Julien Nordmann about how to be followed for his brain. I offered that we could follow him in the brain oncology program if he chooses.

## 2021-07-14 NOTE — Progress Notes (Signed)
  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  Interval Since Last Radiation:  4 weeks   06/09/2021 through 06/20/2021 Site Technique Total Dose (Gy) Dose per Fx (Gy) Completed Fx Beam Energies  Brain:  Whole Brain Complex 30/30 3 10/10 6X  Abdomen:  Bilateral Adrenal Glands 3D 30/30 3 10/10 10X, 15X    Narrative:  The patient was contacted today for routine follow-up. During treatment he did very well with radiotherapy and did not have significant desquamation.    Impression/Plan: 1. 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 I was unable to reach the patient but left a voicemail and on the message, I  discussed that we could continue to follow him in the brain oncology conference, and asked that he call back to let me know if he'd like to be followed in this manner. He will also continue to follow up with Dr. Julien Nordmann in medical oncology.      Carola Rhine, PAC

## 2021-07-14 NOTE — Addendum Note (Signed)
Encounter addended by: Hayden Pedro, PA-C on: 07/14/2021 11:59 AM  Actions taken: Clinical Note Signed

## 2021-07-15 ENCOUNTER — Inpatient Hospital Stay: Payer: PPO

## 2021-07-15 ENCOUNTER — Inpatient Hospital Stay: Payer: PPO | Admitting: Physician Assistant

## 2021-07-16 ENCOUNTER — Inpatient Hospital Stay: Payer: PPO

## 2021-07-16 ENCOUNTER — Inpatient Hospital Stay: Payer: PPO | Attending: Neurological Surgery

## 2021-07-16 ENCOUNTER — Inpatient Hospital Stay (HOSPITAL_BASED_OUTPATIENT_CLINIC_OR_DEPARTMENT_OTHER): Payer: PPO | Admitting: Physician Assistant

## 2021-07-16 ENCOUNTER — Other Ambulatory Visit: Payer: Self-pay

## 2021-07-16 VITALS — BP 102/58 | HR 66 | Temp 98.6°F | Resp 17 | Wt 159.0 lb

## 2021-07-16 DIAGNOSIS — Z79899 Other long term (current) drug therapy: Secondary | ICD-10-CM | POA: Diagnosis not present

## 2021-07-16 DIAGNOSIS — C3491 Malignant neoplasm of unspecified part of right bronchus or lung: Secondary | ICD-10-CM | POA: Insufficient documentation

## 2021-07-16 DIAGNOSIS — Z5112 Encounter for antineoplastic immunotherapy: Secondary | ICD-10-CM | POA: Insufficient documentation

## 2021-07-16 DIAGNOSIS — C7931 Secondary malignant neoplasm of brain: Secondary | ICD-10-CM | POA: Diagnosis not present

## 2021-07-16 DIAGNOSIS — I959 Hypotension, unspecified: Secondary | ICD-10-CM | POA: Diagnosis not present

## 2021-07-16 DIAGNOSIS — F17210 Nicotine dependence, cigarettes, uncomplicated: Secondary | ICD-10-CM | POA: Diagnosis not present

## 2021-07-16 DIAGNOSIS — C7951 Secondary malignant neoplasm of bone: Secondary | ICD-10-CM | POA: Diagnosis not present

## 2021-07-16 DIAGNOSIS — Z95828 Presence of other vascular implants and grafts: Secondary | ICD-10-CM

## 2021-07-16 DIAGNOSIS — Z7982 Long term (current) use of aspirin: Secondary | ICD-10-CM | POA: Diagnosis not present

## 2021-07-16 LAB — CBC WITH DIFFERENTIAL (CANCER CENTER ONLY)
Abs Immature Granulocytes: 0.02 10*3/uL (ref 0.00–0.07)
Basophils Absolute: 0 10*3/uL (ref 0.0–0.1)
Basophils Relative: 0 %
Eosinophils Absolute: 0.4 10*3/uL (ref 0.0–0.5)
Eosinophils Relative: 10 %
HCT: 41.1 % (ref 39.0–52.0)
Hemoglobin: 13.6 g/dL (ref 13.0–17.0)
Immature Granulocytes: 1 %
Lymphocytes Relative: 4 %
Lymphs Abs: 0.2 10*3/uL — ABNORMAL LOW (ref 0.7–4.0)
MCH: 30.3 pg (ref 26.0–34.0)
MCHC: 33.1 g/dL (ref 30.0–36.0)
MCV: 91.5 fL (ref 80.0–100.0)
Monocytes Absolute: 0.5 10*3/uL (ref 0.1–1.0)
Monocytes Relative: 13 %
Neutro Abs: 2.7 10*3/uL (ref 1.7–7.7)
Neutrophils Relative %: 72 %
Platelet Count: 74 10*3/uL — ABNORMAL LOW (ref 150–400)
RBC: 4.49 MIL/uL (ref 4.22–5.81)
RDW: 13.5 % (ref 11.5–15.5)
WBC Count: 3.7 10*3/uL — ABNORMAL LOW (ref 4.0–10.5)
nRBC: 0 % (ref 0.0–0.2)

## 2021-07-16 LAB — CMP (CANCER CENTER ONLY)
ALT: 5 U/L (ref 0–44)
AST: 10 U/L — ABNORMAL LOW (ref 15–41)
Albumin: 3.6 g/dL (ref 3.5–5.0)
Alkaline Phosphatase: 76 U/L (ref 38–126)
Anion gap: 9 (ref 5–15)
BUN: 24 mg/dL — ABNORMAL HIGH (ref 8–23)
CO2: 20 mmol/L — ABNORMAL LOW (ref 22–32)
Calcium: 9.1 mg/dL (ref 8.9–10.3)
Chloride: 111 mmol/L (ref 98–111)
Creatinine: 1.21 mg/dL (ref 0.61–1.24)
GFR, Estimated: >60 mL/min (ref >60)
Glucose, Bld: 91 mg/dL (ref 70–99)
Potassium: 4.8 mmol/L (ref 3.5–5.1)
Sodium: 140 mmol/L (ref 135–145)
Total Bilirubin: 0.2 mg/dL — ABNORMAL LOW (ref 0.3–1.2)
Total Protein: 6.1 g/dL — ABNORMAL LOW (ref 6.5–8.1)

## 2021-07-16 LAB — TSH: TSH: 0.541 u[IU]/mL (ref 0.320–4.118)

## 2021-07-16 MED ORDER — SODIUM CHLORIDE 0.9 % IV SOLN: Freq: Once | INTRAVENOUS | Status: AC

## 2021-07-16 MED ORDER — SODIUM CHLORIDE 0.9 % IV SOLN
1500.0000 mg | Freq: Once | INTRAVENOUS | Status: AC
  Administered 2021-07-16: 1500 mg via INTRAVENOUS
  Filled 2021-07-16: qty 30

## 2021-07-16 MED ORDER — SODIUM CHLORIDE 0.9% FLUSH
10.0000 mL | Freq: Once | INTRAVENOUS | Status: AC
  Administered 2021-07-16: 10 mL

## 2021-07-16 MED ORDER — HEPARIN SOD (PORK) LOCK FLUSH 100 UNIT/ML IV SOLN
500.0000 [IU] | Freq: Once | INTRAVENOUS | Status: AC | PRN
  Administered 2021-07-16: 500 [IU]

## 2021-07-16 MED ORDER — SODIUM CHLORIDE 0.9% FLUSH
10.0000 mL | INTRAVENOUS | Status: DC | PRN
  Administered 2021-07-16: 10 mL

## 2021-07-16 NOTE — Patient Instructions (Signed)
[facility]   Discharge Instructions: Thank you for choosing [facility] to provide your oncology and hematology care.   If you have a lab appointment with the [facility], please go directly to the [facility] and check in at the registration area.   Wear comfortable clothing and clothing appropriate for easy access to any Portacath or PICC line.   We strive to give you quality time with your provider. You may need to reschedule your appointment if you arrive late (15 or more minutes).  Arriving late affects you and other patients whose appointments are after yours.  Also, if you miss three or more appointments without notifying the office, you may be dismissed from the clinic at the provider's discretion.      For prescription refill requests, have your pharmacy contact our office and allow 72 hours for refills to be completed.    Today you received the following chemotherapy and/or immunotherapy agents: Durvalumab (Imfinzi).      To help prevent nausea and vomiting after your treatment, we encourage you to take your nausea medication as directed.  BELOW ARE SYMPTOMS THAT SHOULD BE REPORTED IMMEDIATELY: *FEVER GREATER THAN 100.4 F (38 C) OR HIGHER *CHILLS OR SWEATING *NAUSEA AND VOMITING THAT IS NOT CONTROLLED WITH YOUR NAUSEA MEDICATION *UNUSUAL SHORTNESS OF BREATH *UNUSUAL BRUISING OR BLEEDING *URINARY PROBLEMS (pain or burning when urinating, or frequent urination) *BOWEL PROBLEMS (unusual diarrhea, constipation, pain near the anus) TENDERNESS IN MOUTH AND THROAT WITH OR WITHOUT PRESENCE OF ULCERS (sore throat, sores in mouth, or a toothache) UNUSUAL RASH, SWELLING OR PAIN  UNUSUAL VAGINAL DISCHARGE OR ITCHING   Items with * indicate a potential emergency and should be followed up as soon as possible or go to the Emergency Department if any problems should occur.  Please show the CHEMOTHERAPY ALERT CARD or IMMUNOTHERAPY ALERT CARD  at check-in to the Emergency Department and triage nurse.  Should you have questions after your visit or need to cancel or reschedule your appointment, please contact [facility]  Dept: [phone]  and follow the prompts.  Office hours are 8:00 a.m. to 4:30 p.m. Monday - Friday. Please note that voicemails left after 4:00 p.m. may not be returned until the following business day.  We are closed weekends and major holidays. You have access to a nurse at all times for urgent questions. Please call the main number to the clinic Dept: [phone] and follow the prompts.   For any non-urgent questions, you may also contact your provider using MyChart. We now offer e-Visits for anyone 73 and older to request care online for non-urgent symptoms. For details visit [email or web address].   Also download the MyChart app! Go to the app store, search "MyChart", open the app, select [facility], and log in with your MyChart username and password.  Due to Covid, a mask is required upon entering the hospital/clinic. If you do not have a mask, one will be given to you upon arrival. For doctor visits, patients may have 1 support person aged 64 or older with them. For treatment visits, patients cannot have anyone with them due to current Covid guidelines and our immunocompromised population.

## 2021-07-16 NOTE — Progress Notes (Signed)
Per [name] Heilingoetter PA-C, ok to treat with platelets 76.

## 2021-07-21 DIAGNOSIS — I700 Atherosclerosis of aorta: Secondary | ICD-10-CM | POA: Diagnosis not present

## 2021-07-21 DIAGNOSIS — D509 Iron deficiency anemia, unspecified: Secondary | ICD-10-CM | POA: Diagnosis not present

## 2021-07-21 DIAGNOSIS — I2510 Atherosclerotic heart disease of native coronary artery without angina pectoris: Secondary | ICD-10-CM | POA: Diagnosis not present

## 2021-07-21 DIAGNOSIS — K219 Gastro-esophageal reflux disease without esophagitis: Secondary | ICD-10-CM | POA: Diagnosis not present

## 2021-07-21 DIAGNOSIS — I10 Essential (primary) hypertension: Secondary | ICD-10-CM | POA: Diagnosis not present

## 2021-07-21 DIAGNOSIS — C3490 Malignant neoplasm of unspecified part of unspecified bronchus or lung: Secondary | ICD-10-CM | POA: Diagnosis not present

## 2021-07-21 DIAGNOSIS — Z0000 Encounter for general adult medical examination without abnormal findings: Secondary | ICD-10-CM | POA: Diagnosis not present

## 2021-07-21 DIAGNOSIS — E785 Hyperlipidemia, unspecified: Secondary | ICD-10-CM | POA: Diagnosis not present

## 2021-07-22 ENCOUNTER — Telehealth: Payer: Self-pay | Admitting: Dietician

## 2021-07-22 NOTE — Telephone Encounter (Signed)
Nutrition  Received telephone call from patient with recommendations for alternate supplement ideas. Patient reports he is eating as usual, but has lost weight. Patient reports recently completing 12 days of radiation and has been feeling run down. He does not care for Boost or Ensure. Patient previously tried [employer] which were okay. He enjoys making shakes at home with [employer] ice cream and asking about adding protein powder. Discussed trying CIB blended with ice cream for high calorie, high protein shake or mixed with whole fat milk as supplement. Discussed Nestle Beneprotein as option for protein powder. Encouraged high calorie, high protein snacks in between meals. Patient has previously been provided handout with snack ideas. Will mail high calorie, high protein shake recipes. Patient appreciative. He was encouraged to contact with nutrition questions/concerns.

## 2021-07-23 ENCOUNTER — Other Ambulatory Visit: Payer: Self-pay | Admitting: *Deleted

## 2021-07-23 DIAGNOSIS — I739 Peripheral vascular disease, unspecified: Secondary | ICD-10-CM

## 2021-07-24 ENCOUNTER — Telehealth: Payer: Self-pay | Admitting: Medical Oncology

## 2021-07-24 NOTE — Telephone Encounter (Signed)
His PCP wants to know if pt can receive the flu /tdap and shingrex vaccines.  I told Loma Sousa that the flu vaccine is okay .

## 2021-07-28 ENCOUNTER — Ambulatory Visit: Payer: PPO | Admitting: Surgery

## 2021-07-28 ENCOUNTER — Other Ambulatory Visit (HOSPITAL_COMMUNITY): Payer: Self-pay | Admitting: Surgery

## 2021-07-28 ENCOUNTER — Other Ambulatory Visit: Payer: Self-pay

## 2021-07-28 ENCOUNTER — Ambulatory Visit (INDEPENDENT_AMBULATORY_CARE_PROVIDER_SITE_OTHER)
Admission: RE | Admit: 2021-07-28 | Discharge: 2021-07-28 | Disposition: A | Discharge status: Home / Self Care | Payer: PPO | Source: Ambulatory Visit | Attending: Surgery | Admitting: Surgery

## 2021-07-28 ENCOUNTER — Ambulatory Visit (HOSPITAL_COMMUNITY)
Admission: RE | Admit: 2021-07-28 | Discharge: 2021-07-28 | Disposition: A | Discharge status: Home / Self Care | Payer: PPO | Source: Ambulatory Visit | Attending: Surgery | Admitting: Surgery

## 2021-07-28 VITALS — BP 96/63 | HR 67 | Temp 98.2°F | Resp 20 | Ht 69.0 in | Wt 156.5 lb

## 2021-07-28 DIAGNOSIS — I739 Peripheral vascular disease, unspecified: Secondary | ICD-10-CM | POA: Diagnosis not present

## 2021-07-28 DIAGNOSIS — I7133 Infrarenal abdominal aortic aneurysm, ruptured: Secondary | ICD-10-CM | POA: Diagnosis not present

## 2021-07-28 NOTE — Progress Notes (Signed)
[facility] [place]  Patient name: Gregory Hodges MRN: [id number] DOB: Dec 26, 1940 Sex: male   REASON FOR VISIT:    Follow up  [place]:    The patient is back today for followup. 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 His previous repair was done at [facility]. 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 I took him emergently to the operating room and proceeded with bilateral axillary femoral bypass graft with 8 mm ring PTFE. I removed his infected graft and oversewed his aorta. I had to leave a small cuff on the distal iliac artery because it was well incorporated. I have recommended lifelong antibiotics. 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   He has no complaints today.  He has a history of MI.  HE takes a statin for hypercholesterolemia.  He is a smoker.  He is being treated for stage [id number] small cell lung cancer with palliative radiotherapy to L5, right hip, and whole brain radiation as well as chemotherapy.   PAST MEDICAL HISTORY:   Past Medical History:  Diagnosis Date   AAA (abdominal aortic aneurysm)    s/p open repair using aortobifemoral graft 03/03/10 ([facility]), complicated by wound dehisence, enterocutaneous fistula; developed aortic graft infection s/p explant of graft and placement of bilateral axillofemoral grafts 07/22/10 [facility])   Cancer ([place])    Skin   Crohn's disease ([place])    E coli bacteremia    History of kidney stones    Hyperlipemia    Hypertension    "denies"   Myocardial infarction ([place]) 08/11/2005   s/p Horizon study stent D1   Peripheral vascular disease ([place]) June 2011   SCLC (small cell lung carcinoma) ([place]) dx'd 10/2020   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  SOCIAL HISTORY:   Social History   Tobacco Use   Smoking status: Every Day    Packs/day: 1.00    Years: 30.00    Pack years: 30.00    Types: Cigarettes   Smokeless tobacco: Never  Substance Use Topics   Alcohol use: Not Currently    Comment: special ocassional     ALLERGIES:   Allergies  Allergen Reactions   Oxycodone Nausea And Vomiting    Pt states he can not take any narcotic pain pills   Codeine Nausea And Vomiting    All Narcotics   Lisinopril     Throat swell up     CURRENT MEDICATIONS:   Current Outpatient Medications  Medication Sig Dispense Refill   acetaminophen (TYLENOL) 500 MG tablet Take 500 mg by mouth every 6 (six) hours as needed for moderate pain.     aspirin 81 MG tablet Take 81 mg by mouth daily.     atenolol (TENORMIN) 25 MG tablet Take 25 mg by mouth 2 (two) times daily.     calcium carbonate (TUMS - DOSED IN MG ELEMENTAL CALCIUM) 500 MG chewable tablet Chew 2 tablets by mouth daily as needed for indigestion or heartburn.     diphenhydrAMINE (BENADRYL) 25 MG tablet Take 50 mg by mouth daily as needed for allergies.     ferrous sulfate 325 (65 FE) MG tablet Take 325 mg by mouth every Monday, Wednesday, and Friday.  lidocaine-prilocaine (EMLA) cream Apply to the Port-A-Cath site 30-60 minutes before treatment 30 g 0   nitroGLYCERIN (NITROSTAT) 0.4 MG SL tablet Place 0.4 mg under the tongue every 5 (five) minutes as needed for chest pain.     omeprazole (PRILOSEC) 10 MG capsule Take 10 mg by mouth daily.     ondansetron (ZOFRAN) 8 MG tablet Take 1 tablet (8 mg total) by mouth every 8 (eight) hours as needed for nausea or vomiting. 20 tablet 0   oxymetazoline (AFRIN) 0.05 % nasal spray Place 1 spray into both nostrils 2 (two) times daily as needed for congestion.     prochlorperazine (COMPAZINE) 10 MG tablet Take 1 tablet (10 mg total) by mouth every 6 (six) hours as needed for nausea or vomiting. 30 tablet 0   rosuvastatin  (CRESTOR) 10 MG tablet Take 10 mg by mouth daily.     No current facility-administered medications for this visit.    REVIEW OF SYSTEMS:   [X]  denotes positive finding, [ ]  denotes negative finding Cardiac  Comments:  Chest pain or chest pressure:    Shortness of breath upon exertion:    Short of breath when lying flat:    Irregular heart rhythm:        Vascular    Pain in calf, thigh, or hip brought on by ambulation:    Pain in feet at night that wakes you up from your sleep:     Blood clot in your veins:    Leg swelling:         Pulmonary    Oxygen at home:    Productive cough:     Wheezing:         Neurologic    Sudden weakness in arms or legs:     Sudden numbness in arms or legs:     Sudden onset of difficulty speaking or slurred speech:    Temporary loss of vision in one eye:     Problems with dizziness:         Gastrointestinal    Blood in stool:     Vomited blood:         Genitourinary    Burning when urinating:     Blood in urine:        Psychiatric    Major depression:         Hematologic    Bleeding problems:    Problems with blood clotting too easily:        Skin    Rashes or ulcers:        Constitutional    Fever or chills:      PHYSICAL EXAM:   Vitals:   07/28/21 1501  BP: 96/63  Pulse: 67  Resp: 20  Temp: 98.2 F (36.8 C)  TempSrc: Temporal  SpO2: 99%  Weight: 156 lb 8 oz (71 kg)  Height: 5' 9"  (1.753 m)    GENERAL: The patient is a well-nourished male, in no acute distress. The vital signs are documented above. CARDIAC: There is a regular rate and rhythm.  VASCULAR: Faintly palpable pedal pulses PULMONARY: Non-labored respirations ABDOMEN: Ventral hernia MUSCULOSKELETAL: There are no major deformities or cyanosis. NEUROLOGIC: No focal weakness or paresthesias are detected. SKIN: There are no ulcers or rashes noted. PSYCHIATRIC: The patient has a normal affect.  STUDIES:   I have reviewed his ultrasound with the following  findings: +-------+-----------+-----------+------------+------------+  ABI/TBIToday's ABIToday's TBIPrevious ABIPrevious TBI  +-------+-----------+-----------+------------+------------+  Right  1  0.58       0.97        0.49          +-------+-----------+-----------+------------+------------+  Left   0.98       0.67       1.03        0.76          +-------+-----------+-----------+------------+------------+ Carotid: Right Carotid: Velocities in the right ICA are consistent with a 60-79%                 stenosis. The ECA appears >50% stenosed.   Left Carotid: Velocities in the left ICA are consistent with a 1-39%  stenosis.   Vertebrals:  Bilateral vertebral arteries demonstrate antegrade flow.  Subclavians: Right subclavian artery was stenotic. Normal flow  hemodynamics were               seen in the left subclavian artery.  MEDICAL ISSUES:   Patient's ABIs remained stable.  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  I would not recommend intervention at this time.  He will continue on his current medicine profile including aspirin and a statin.  I have him scheduled for follow-up scans in 1 year.  This would be a carotid as well as ABIs.    Leia Alf, MD, FACS [facility] [phone] Pager [phone]

## 2021-08-11 ENCOUNTER — Other Ambulatory Visit: Payer: Self-pay

## 2021-08-11 ENCOUNTER — Ambulatory Visit (HOSPITAL_COMMUNITY)
Admission: RE | Admit: 2021-08-11 | Discharge: 2021-08-11 | Disposition: A | Discharge status: Home / Self Care | Payer: PPO | Source: Ambulatory Visit | Attending: Physician Assistant | Admitting: Physician Assistant

## 2021-08-11 DIAGNOSIS — J439 Emphysema, unspecified: Secondary | ICD-10-CM | POA: Diagnosis not present

## 2021-08-11 DIAGNOSIS — I700 Atherosclerosis of aorta: Secondary | ICD-10-CM | POA: Diagnosis not present

## 2021-08-11 DIAGNOSIS — C3491 Malignant neoplasm of unspecified part of right bronchus or lung: Secondary | ICD-10-CM | POA: Insufficient documentation

## 2021-08-11 DIAGNOSIS — C3490 Malignant neoplasm of unspecified part of unspecified bronchus or lung: Secondary | ICD-10-CM | POA: Diagnosis not present

## 2021-08-11 DIAGNOSIS — K769 Liver disease, unspecified: Secondary | ICD-10-CM | POA: Diagnosis not present

## 2021-08-11 DIAGNOSIS — R911 Solitary pulmonary nodule: Secondary | ICD-10-CM | POA: Diagnosis not present

## 2021-08-11 IMAGING — CT CT CHEST W/ CM
3 of 5 series · 13 of 36 positions shown, 15 images · IV contrast (OMNIPAQUE)
Comparison: [DATE]

CLINICAL DATA: Small-cell lung cancer.  Restaging.

EXAM:
CT CHEST, ABDOMEN, AND PELVIS WITH CONTRAST
TECHNIQUE: Multidetector CT imaging of the chest, abdomen and pelvis was
performed following the standard protocol during bolus
administration of intravenous contrast.
CONTRAST:  80mL OMNIPAQUE IOHEXOL 350 MG/ML SOLN

[Series 2: cap with · axial · 0.86mm/px · z∈[-461,+49]mm · 8 of 132 slices shown, 10 images]
[im 15/132  mediastinal]
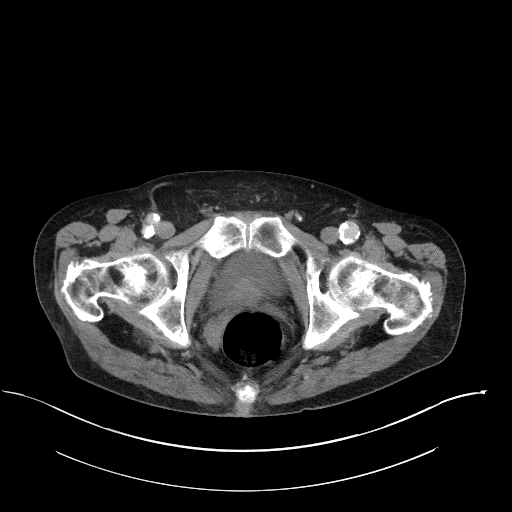
[im 15/132  lung]
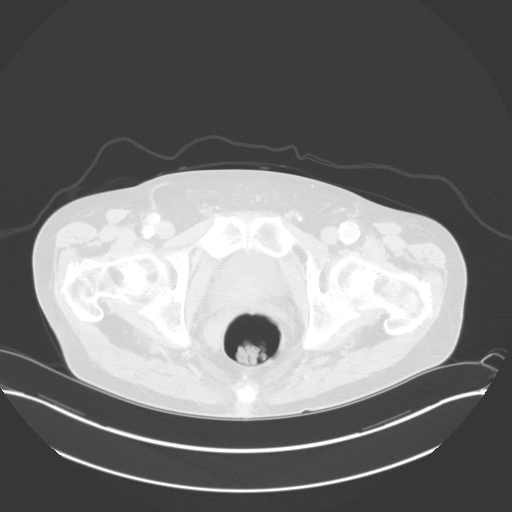
[im 30/132  lung]
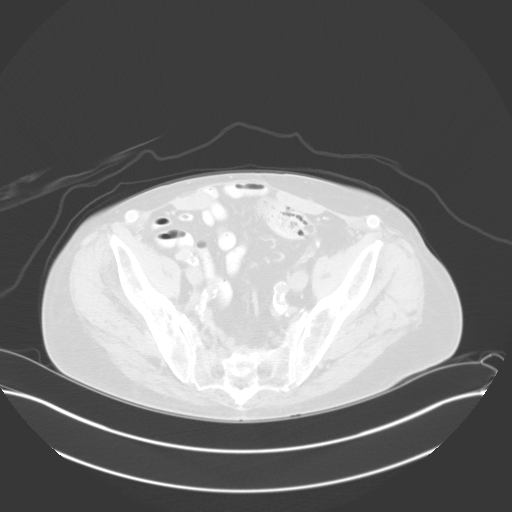
[im 44/132  lung]
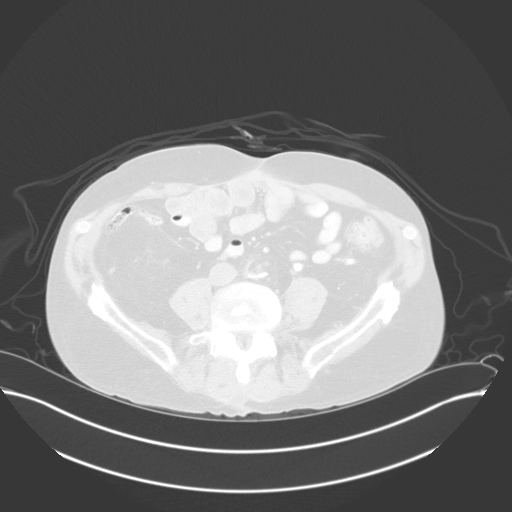
[im 59/132  lung]
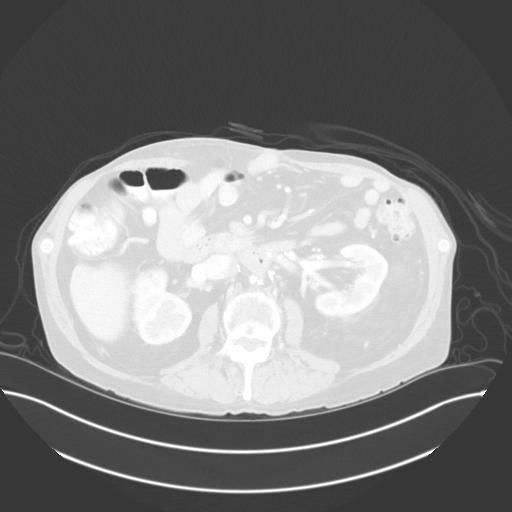
[im 73/132  mediastinal]
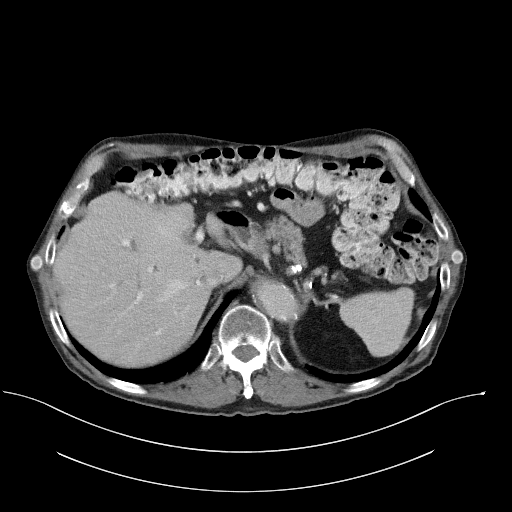
[im 73/132  lung]
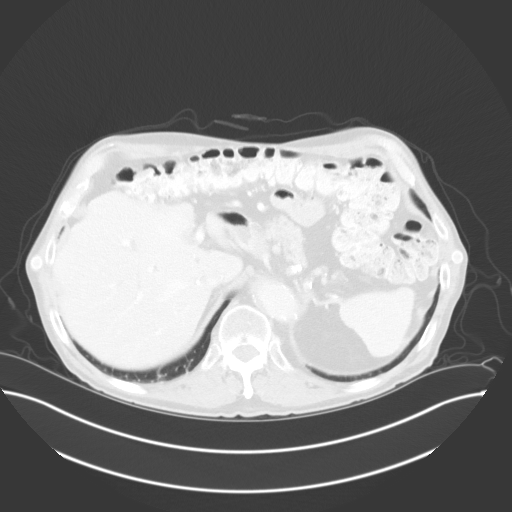
[im 88/132  lung]
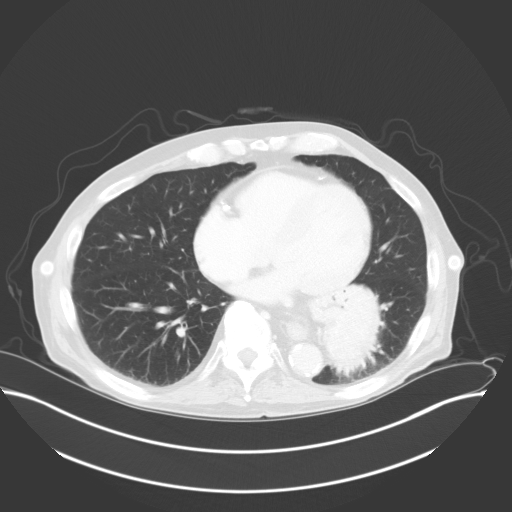
[im 102/132  lung]
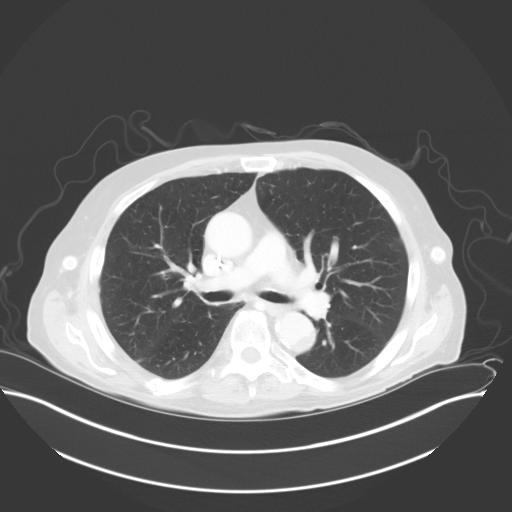
[im 117/132  lung]
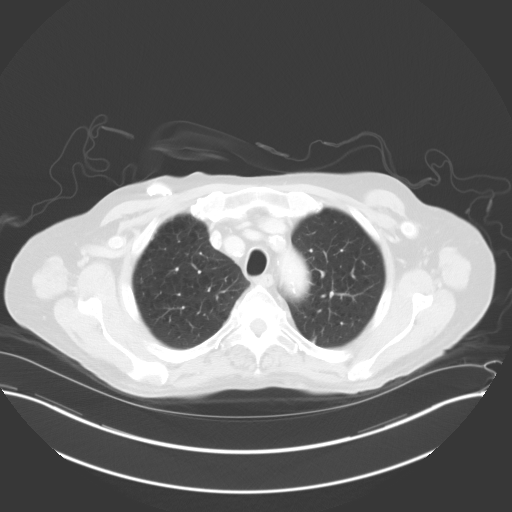

[Series 4: lung · axial · 0.86mm/px · z∈[-218,-166]mm · 2 of 185 slices shown]
[im 14/185  lung]
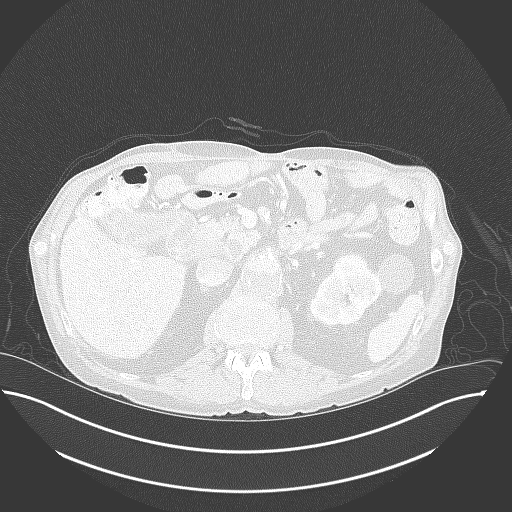
[im 40/185  lung]
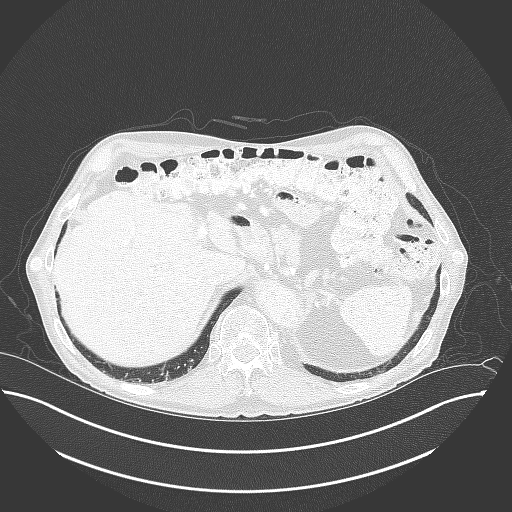

[Series 5: coronals · coronal · 0.82mm/px · 3 of 137 slices shown]
[im 28/137  lung]
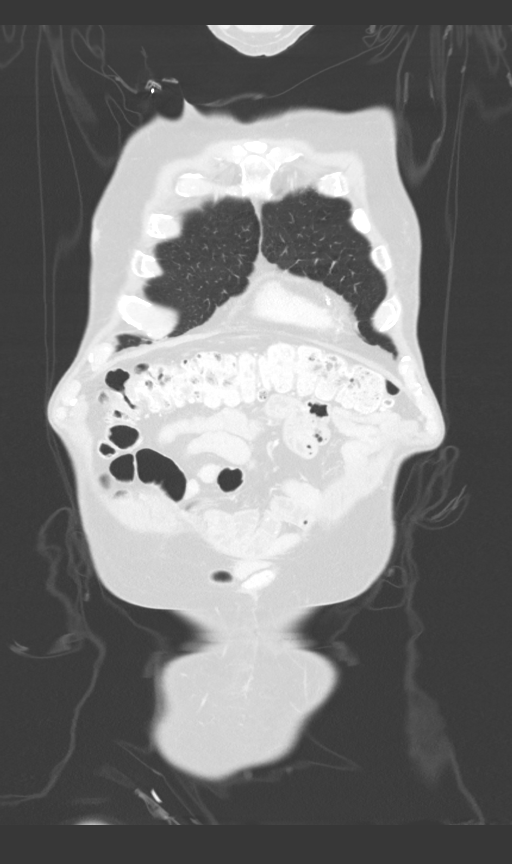
[im 55/137  lung]
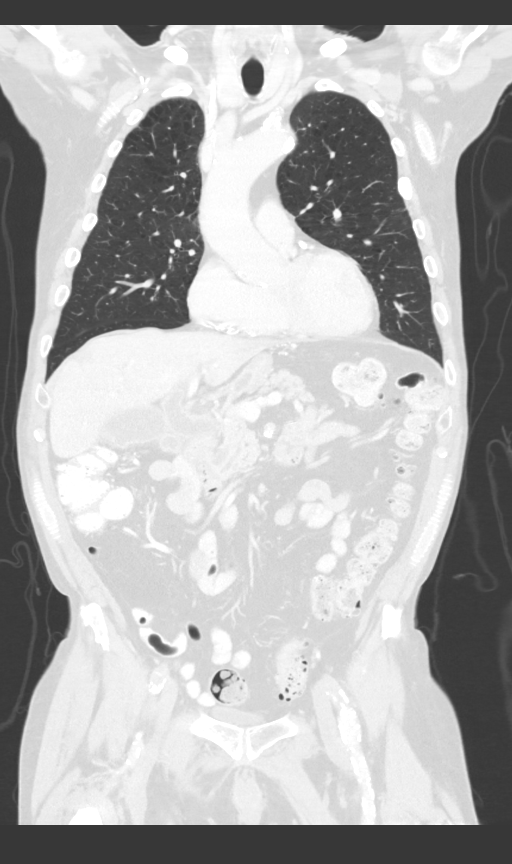
[im 82/137  lung]
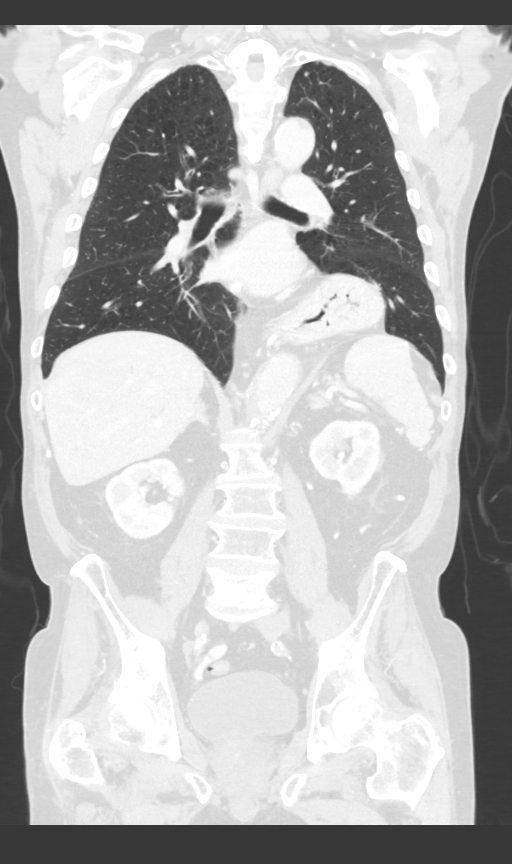

[13 of 36 positions shown; findings below may reference images not displayed]

FINDINGS: CT CHEST FINDINGS

Cardiovascular: The heart size is normal. No substantial pericardial
effusion. Coronary artery calcification is evident. Moderate
atherosclerotic calcification is noted in the wall of the thoracic
aorta. Right Port-A-Cath tip is positioned in the distal SVC. Extra
anatomic bypass grafts noted in the thoracic sidewall bilaterally.

Mediastinum/Nodes: No mediastinal lymphadenopathy. There is no hilar
lymphadenopathy. The esophagus has normal imaging features. There is
no axillary lymphadenopathy.

Lungs/Pleura: Centrilobular and paraseptal emphysema evident. 3 mm
right upper lobe nodule on 64/4 is new in the interval no other new
or suspicious pulmonary nodule/mass. No focal airspace
consolidation. No pleural effusion.

Musculoskeletal: Similar appearance of bilateral sclerotic rib
lesions including posterior right seventh rib with associated
fracture nonunion and sclerotic anterior left fourth rib.

CT ABDOMEN PELVIS FINDINGS

Hepatobiliary: Scattered tiny hypodensities in the liver measure up
to 9 mm, too small to characterize but unchanged in the interval.
Gallbladder is distended. Intra and extrahepatic biliary duct
dilatation again noted with common bile duct measuring 13 mm in the
head of the pancreas.

Pancreas: No focal mass lesion. No dilatation of the main duct. No
intraparenchymal cyst. No peripancreatic edema.

Spleen: No splenomegaly. No focal mass lesion.

Adrenals/Urinary Tract: Interval decrease in both adrenal nodules
with now only some minimal adrenal thickening evident. Bilateral
nonobstructing renal stones again identified with bilateral renal
cysts evident. Scattered tiny hypoattenuating lesions in both
kidneys are too small to characterize but likely benign. No evidence
for hydroureter. The urinary bladder appears normal for the degree
of distention.

Stomach/Bowel: Large hiatal hernia. Duodenum is normally positioned
as is the ligament of Treitz. No small bowel wall thickening. No
small bowel dilatation. The terminal ileum is normal. The appendix
is normal. No gross colonic mass. No colonic wall thickening.
Diverticular changes are noted in the left colon without evidence of
diverticulitis.

Vascular/Lymphatic: Chronic infrarenal aortic occlusion. There is no
gastrohepatic or hepatoduodenal ligament lymphadenopathy. No
retroperitoneal or mesenteric lymphadenopathy. No pelvic sidewall
lymphadenopathy.

Reproductive: The prostate gland and seminal vesicles are
unremarkable.

Other: No intraperitoneal free fluid.

Musculoskeletal: Stable sclerotic lesion involving the right
acetabular and L5 vertebral body.
IMPRESSION: 1. 3 mm right upper lobe pulmonary nodule is new in the interval. 1
nonspecific, close attention on follow-up recommended.
2. Bilateral adrenal nodules seen previously have decreased
substantially in the interval with now only some minimal adrenal
thickening evident bilaterally.
3. Stable appearance of scattered tiny hypoattenuating liver
lesions.
4. Large hiatal hernia.
5. Intra and extrahepatic biliary duct dilatation, similar to prior.
6. Bilateral nonobstructing renal stones with bilateral renal cysts.
7. Chronic infrarenal aortic occlusion.
8. Aortic Atherosclerosis ([BP]-[BP]) and Emphysema ([BP]-[BP]).

## 2021-08-11 MED ORDER — IOHEXOL 350 MG/ML SOLN
80.0000 mL | Freq: Once | INTRAVENOUS | Status: AC | PRN
  Administered 2021-08-11: 80 mL via INTRAVENOUS

## 2021-08-11 MED ORDER — HEPARIN SOD (PORK) LOCK FLUSH 100 UNIT/ML IV SOLN
500.0000 [IU] | Freq: Once | INTRAVENOUS | Status: AC
  Administered 2021-08-11: 500 [IU] via INTRAVENOUS

## 2021-08-11 MED ORDER — HEPARIN SOD (PORK) LOCK FLUSH 100 UNIT/ML IV SOLN
INTRAVENOUS | Status: AC
  Filled 2021-08-11: qty 5

## 2021-08-12 ENCOUNTER — Encounter: Payer: Self-pay | Admitting: *Deleted

## 2021-08-12 ENCOUNTER — Inpatient Hospital Stay: Payer: PPO

## 2021-08-12 ENCOUNTER — Inpatient Hospital Stay: Payer: PPO | Admitting: Internal Medicine

## 2021-08-12 ENCOUNTER — Inpatient Hospital Stay: Payer: PPO | Attending: Neurological Surgery | Admitting: Dietician

## 2021-08-12 ENCOUNTER — Encounter: Payer: Self-pay | Admitting: Internal Medicine

## 2021-08-12 VITALS — BP 98/57 | HR 63 | Temp 98.7°F | Resp 16

## 2021-08-12 VITALS — BP 121/63 | HR 94 | Temp 98.7°F | Resp 18 | Ht 69.0 in | Wt 158.2 lb

## 2021-08-12 DIAGNOSIS — I959 Hypotension, unspecified: Secondary | ICD-10-CM | POA: Insufficient documentation

## 2021-08-12 DIAGNOSIS — C7951 Secondary malignant neoplasm of bone: Secondary | ICD-10-CM | POA: Diagnosis not present

## 2021-08-12 DIAGNOSIS — Z79899 Other long term (current) drug therapy: Secondary | ICD-10-CM

## 2021-08-12 DIAGNOSIS — C7931 Secondary malignant neoplasm of brain: Secondary | ICD-10-CM

## 2021-08-12 DIAGNOSIS — C3491 Malignant neoplasm of unspecified part of right bronchus or lung: Secondary | ICD-10-CM | POA: Diagnosis not present

## 2021-08-12 DIAGNOSIS — Z923 Personal history of irradiation: Secondary | ICD-10-CM

## 2021-08-12 DIAGNOSIS — Z7982 Long term (current) use of aspirin: Secondary | ICD-10-CM

## 2021-08-12 DIAGNOSIS — Z5112 Encounter for antineoplastic immunotherapy: Secondary | ICD-10-CM | POA: Insufficient documentation

## 2021-08-12 DIAGNOSIS — F17210 Nicotine dependence, cigarettes, uncomplicated: Secondary | ICD-10-CM | POA: Insufficient documentation

## 2021-08-12 DIAGNOSIS — Z95828 Presence of other vascular implants and grafts: Secondary | ICD-10-CM

## 2021-08-12 LAB — CBC WITH DIFFERENTIAL (CANCER CENTER ONLY)
Abs Immature Granulocytes: 0.01 10*3/uL (ref 0.00–0.07)
Basophils Absolute: 0 10*3/uL (ref 0.0–0.1)
Basophils Relative: 1 %
Eosinophils Absolute: 0.1 10*3/uL (ref 0.0–0.5)
Eosinophils Relative: 2 %
HCT: 40.7 % (ref 39.0–52.0)
Hemoglobin: 13.3 g/dL (ref 13.0–17.0)
Immature Granulocytes: 0 %
Lymphocytes Relative: 4 %
Lymphs Abs: 0.2 10*3/uL — ABNORMAL LOW (ref 0.7–4.0)
MCH: 30.2 pg (ref 26.0–34.0)
MCHC: 32.7 g/dL (ref 30.0–36.0)
MCV: 92.5 fL (ref 80.0–100.0)
Monocytes Absolute: 0.4 10*3/uL (ref 0.1–1.0)
Monocytes Relative: 11 %
Neutro Abs: 3.4 10*3/uL (ref 1.7–7.7)
Neutrophils Relative %: 82 %
Platelet Count: 79 10*3/uL — ABNORMAL LOW (ref 150–400)
RBC: 4.4 MIL/uL (ref 4.22–5.81)
RDW: 14.9 % (ref 11.5–15.5)
WBC Count: 4.1 10*3/uL (ref 4.0–10.5)
nRBC: 0 % (ref 0.0–0.2)

## 2021-08-12 LAB — CMP (CANCER CENTER ONLY)
ALT: 6 U/L (ref 0–44)
AST: 11 U/L — ABNORMAL LOW (ref 15–41)
Albumin: 3.7 g/dL (ref 3.5–5.0)
Alkaline Phosphatase: 83 U/L (ref 38–126)
Anion gap: 8 (ref 5–15)
BUN: 21 mg/dL (ref 8–23)
CO2: 21 mmol/L — ABNORMAL LOW (ref 22–32)
Calcium: 8.7 mg/dL — ABNORMAL LOW (ref 8.9–10.3)
Chloride: 108 mmol/L (ref 98–111)
Creatinine: 1.2 mg/dL (ref 0.61–1.24)
GFR, Estimated: >60 mL/min (ref >60)
Glucose, Bld: 118 mg/dL — ABNORMAL HIGH (ref 70–99)
Potassium: 4.2 mmol/L (ref 3.5–5.1)
Sodium: 137 mmol/L (ref 135–145)
Total Bilirubin: 0.4 mg/dL (ref 0.3–1.2)
Total Protein: 6 g/dL — ABNORMAL LOW (ref 6.5–8.1)

## 2021-08-12 LAB — TSH: TSH: 0.781 u[IU]/mL (ref 0.320–4.118)

## 2021-08-12 MED ORDER — SODIUM CHLORIDE 0.9 % IV SOLN
1500.0000 mg | Freq: Once | INTRAVENOUS | Status: AC
  Administered 2021-08-12: 1500 mg via INTRAVENOUS
  Filled 2021-08-12: qty 30

## 2021-08-12 MED ORDER — HEPARIN SOD (PORK) LOCK FLUSH 100 UNIT/ML IV SOLN
500.0000 [IU] | Freq: Once | INTRAVENOUS | Status: AC | PRN
  Administered 2021-08-12: 500 [IU]

## 2021-08-12 MED ORDER — SODIUM CHLORIDE 0.9% FLUSH
10.0000 mL | Freq: Once | INTRAVENOUS | Status: AC
  Administered 2021-08-12: 10 mL

## 2021-08-12 MED ORDER — SODIUM CHLORIDE 0.9% FLUSH
10.0000 mL | INTRAVENOUS | Status: DC | PRN
  Administered 2021-08-12: 10 mL

## 2021-08-12 MED ORDER — SODIUM CHLORIDE 0.9 % IV SOLN: Freq: Once | INTRAVENOUS | Status: AC

## 2021-08-12 NOTE — Patient Instructions (Signed)
Steps to Quit Smoking Smoking tobacco is the leading cause of preventable death. It can affect almost every organ in the body. Smoking puts you and people around you at risk for many serious, long-lasting (chronic) diseases. Quitting smoking can be hard, but it is one of the best things that you can do for your health. It is never too late to quit. How do I get ready to quit? When you decide to quit smoking, make a plan to help you succeed. Before you quit: Pick a date to quit. Set a date within the next 2 weeks to give you time to prepare. Write down the reasons why you are quitting. Keep this list in places where you will see it often. Tell your family, friends, and co-workers that you are quitting. Their support is important. Talk with your doctor about the choices that may help you quit. Find out if your health insurance will pay for these treatments. Know the people, places, things, and activities that make you want to smoke (triggers). Avoid them. What first steps can I take to quit smoking? Throw away all cigarettes at home, at work, and in your car. Throw away the things that you use when you smoke, such as ashtrays and lighters. Clean your car. Make sure to empty the ashtray. Clean your home, including curtains and carpets. What can I do to help me quit smoking? Talk with your doctor about taking medicines and seeing a counselor at the same time. You are more likely to succeed when you do both. If you are pregnant or breastfeeding, talk with your doctor about counseling or other ways to quit smoking. Do not take medicine to help you quit smoking unless your doctor tells you to do so. To quit smoking: Quit right away Quit smoking totally, instead of slowly cutting back on how much you smoke over a period of time. Go to counseling. You are more likely to quit if you go to counseling sessions regularly. Take medicine You may take medicines to help you quit. Some medicines need a  prescription, and some you can buy over-the-counter. Some medicines may contain a drug called nicotine to replace the nicotine in cigarettes. Medicines may: Help you to stop having the desire to smoke (cravings). Help to stop the problems that come when you stop smoking (withdrawal symptoms). Your doctor may ask you to use: Nicotine patches, gum, or lozenges. Nicotine inhalers or sprays. Non-nicotine medicine that is taken by mouth. Find resources Find resources and other ways to help you quit smoking and remain smoke-free after you quit. These resources are most helpful when you use them often. They include: Online chats with a Social worker. Phone quitlines. Printed Furniture conservator/restorer. Support groups or group counseling. Text messaging programs. Mobile phone apps. Use apps on your mobile phone or tablet that can help you stick to your quit plan. There are many free apps for mobile phones and tablets as well as websites. Examples include Quit Guide from the [employer] and smokefree.gov  What things can I do to make it easier to quit?  Talk to your family and friends. Ask them to support and encourage you. Call a phone quitline (1-800-QUIT-NOW), reach out to support groups, or work with a Social worker. Ask people who smoke to not smoke around you. Avoid places that make you want to smoke, such as: Bars. Parties. Smoke-break areas at work. Spend time with people who do not smoke. Lower the stress in your life. Stress can make you want to  smoke. Try these things to help your stress: Getting regular exercise. Doing deep-breathing exercises. Doing yoga. Meditating. Doing a body scan. To do this, close your eyes, focus on one area of your body at a time from head to toe. Notice which parts of your body are tense. Try to relax the muscles in those areas. How will I feel when I quit smoking? Day 1 to 3 weeks Within the first 24 hours, you may start to have some problems that come from quitting tobacco.  These problems are very bad 2-3 days after you quit, but they do not often last for more than 2-3 weeks. You may get these symptoms: Mood swings. Feeling restless, nervous, angry, or annoyed. Trouble concentrating. Dizziness. Strong desire for high-sugar foods and nicotine. Weight gain. Trouble pooping (constipation). Feeling like you may vomit (nausea). Coughing or a sore throat. Changes in how the medicines that you take for other issues work in your body. Depression. Trouble sleeping (insomnia). Week 3 and afterward After the first 2-3 weeks of quitting, you may start to notice more positive results, such as: Better sense of smell and taste. Less coughing and sore throat. Slower heart rate. Lower blood pressure. Clearer skin. Better breathing. Fewer sick days. Quitting smoking can be hard. Do not give up if you fail the first time. Some people need to try a few times before they succeed. Do your best to stick to your quit plan, and talk with your doctor if you have any questions or concerns. Summary Smoking tobacco is the leading cause of preventable death. Quitting smoking can be hard, but it is one of the best things that you can do for your health. When you decide to quit smoking, make a plan to help you succeed. Quit smoking right away, not slowly over a period of time. When you start quitting, seek help from your doctor, family, or friends. This information is not intended to replace advice given to you by your health care provider. Make sure you discuss any questions you have with your health care provider. Document Revised: 05/23/2021 Document Reviewed: 12/03/2018 Elsevier Patient Education  [place].

## 2021-08-12 NOTE — Progress Notes (Signed)
Nutrition Follow-up:  Patient receiving Imfinzi for metastatic SCLC.   Met with patient during infusion. He reports fatigue and decreased appetite. Patient eating a sandwich and chips at visit. He had watermelon, cantaloupe, english muffin with butter and jelly for breakfast. Patient started drinking CIB daily and adding Beneprotein to foods. Patient is making milkshakes at home, had a vanilla caramel shake last night. Patient denies nausea, vomiting, constipation, diarrhea. Patient has been completing chores around the house, reports his job is to take clean the hardwood floors weekly.    Medications: reviewed  Labs: Glucose 118  Anthropometrics: Weight 158 lb 3.2 oz today increased from 156 lb 8 oz on 10/31 decreased from 159 lb on 10/19 and 166 lb 12.8 oz on 9/20.   NUTRITION DIAGNOSIS: Food and nutrition related knowledge deficit improved    INTERVENTION:  Continue eating high calorie, high protein foods Encouraged small frequent meals and snacks Continue drinking CIB, recommend pt increase to twice daily Continue adding Beneprotein powder to foods (one scoop provides 25 kcal, 6 grams protein) Encouraged activity as able Patient has contact information     MONITORING, EVALUATION, GOAL: weight trends, intake    NEXT VISIT: Tuesday, December 13 during infusion

## 2021-08-12 NOTE — Patient Instructions (Signed)
[facility]   Discharge Instructions: Thank you for choosing [facility] to provide your oncology and hematology care.   If you have a lab appointment with the [facility], please go directly to the [facility] and check in at the registration area.   Wear comfortable clothing and clothing appropriate for easy access to any Portacath or PICC line.   We strive to give you quality time with your provider. You may need to reschedule your appointment if you arrive late (15 or more minutes).  Arriving late affects you and other patients whose appointments are after yours.  Also, if you miss three or more appointments without notifying the office, you may be dismissed from the clinic at the provider's discretion.      For prescription refill requests, have your pharmacy contact our office and allow 72 hours for refills to be completed.    Today you received the following chemotherapy and/or immunotherapy agents: durvalumab.      To help prevent nausea and vomiting after your treatment, we encourage you to take your nausea medication as directed.  BELOW ARE SYMPTOMS THAT SHOULD BE REPORTED IMMEDIATELY: *FEVER GREATER THAN 100.4 F (38 C) OR HIGHER *CHILLS OR SWEATING *NAUSEA AND VOMITING THAT IS NOT CONTROLLED WITH YOUR NAUSEA MEDICATION *UNUSUAL SHORTNESS OF BREATH *UNUSUAL BRUISING OR BLEEDING *URINARY PROBLEMS (pain or burning when urinating, or frequent urination) *BOWEL PROBLEMS (unusual diarrhea, constipation, pain near the anus) TENDERNESS IN MOUTH AND THROAT WITH OR WITHOUT PRESENCE OF ULCERS (sore throat, sores in mouth, or a toothache) UNUSUAL RASH, SWELLING OR PAIN  UNUSUAL VAGINAL DISCHARGE OR ITCHING   Items with * indicate a potential emergency and should be followed up as soon as possible or go to the Emergency Department if any problems should occur.  Please show the CHEMOTHERAPY ALERT CARD or IMMUNOTHERAPY ALERT CARD at check-in  to the Emergency Department and triage nurse.  Should you have questions after your visit or need to cancel or reschedule your appointment, please contact [facility]  Dept: [phone]  and follow the prompts.  Office hours are 8:00 a.m. to 4:30 p.m. Monday - Friday. Please note that voicemails left after 4:00 p.m. may not be returned until the following business day.  We are closed weekends and major holidays. You have access to a nurse at all times for urgent questions. Please call the main number to the clinic Dept: [phone] and follow the prompts.   For any non-urgent questions, you may also contact your provider using MyChart. We now offer e-Visits for anyone 43 and older to request care online for non-urgent symptoms. For details visit [email or web address].   Also download the MyChart app! Go to the app store, search "MyChart", open the app, select [facility], and log in with your MyChart username and password.  Due to Covid, a mask is required upon entering the hospital/clinic. If you do not have a mask, one will be given to you upon arrival. For doctor visits, patients may have 1 support person aged 45 or older with them. For treatment visits, patients cannot have anyone with them due to current Covid guidelines and our immunocompromised population.

## 2021-08-12 NOTE — Progress Notes (Signed)
Gregory [phone]   [phone]) [phone]  OFFICE PROGRESS NOTE  [name], MD [address] [place] [address]  DIAGNOSIS: extensive stage (T2 a, N2, M1c) small cell lung cancer presented with right hilar mass in addition to mediastinal lymphadenopathy as well as metastatic disease to the bone with complete replacement of the L5 status post laminectomy with resection of epidural tumor as well as metastatic disease to the brain, bones and bilateral adrenal glands diagnosed in January 2022.   PRIOR THERAPY:  1) Status post palliative radiotherapy to the resected area at L5 under the care of Dr. Lisbeth Renshaw. 2) whole brain irradiation under the care of Dr. Lisbeth Renshaw. 3) palliative radiotherapy to the enlarging bilateral adrenal gland metastasis under the care of Dr. Lisbeth Renshaw last fraction June 20, 2021.  CURRENT THERAPY: Systemic chemotherapy with carboplatin for AUC of 5 from day 1, etoposide 100 mg/M2 on days 1, 2 and 3 with Cosela 240 mg/M2 before chemotherapy in addition to Imfinzi 1500 mg IV every 3 weeks with day one of the chemotherapy.  First dose December 24, 2020.  Status post 9  cycles.  Starting from cycle #5 the patient will be on maintenance treatment with Imfinzi every 4 weeks.  INTERVAL HISTORY: Gregory Hodges 80 y.o. male returns to the clinic today for follow-up visit.  The patient is feeling fine today with no concerning complaints except for fatigue.  He completed the course of radiotherapy under the care of Dr. Lisbeth Renshaw.  He denied having any current chest pain, shortness of breath, cough or hemoptysis.  He denied having any fever or chills.  He has no nausea, vomiting, diarrhea or constipation.  He has no headache or visual changes.  The patient denied having any significant weight loss or night sweats.  He had repeat CT scan of the chest, abdomen pelvis performed recently and he is here for evaluation and discussion of his scan  results.   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  MEDICATIONS:  Current Outpatient Medications  Medication Sig Dispense Refill   acetaminophen (TYLENOL) 500 MG tablet Take 500 mg by mouth every 6 (six) hours as needed for moderate pain.     aspirin 81 MG tablet Take 81 mg by mouth daily.     atenolol (TENORMIN) 25 MG tablet Take 25 mg by mouth 2 (two) times daily.     calcium carbonate (TUMS - DOSED IN MG ELEMENTAL CALCIUM) 500 MG chewable tablet Chew 2 tablets by mouth daily as needed for indigestion or heartburn.     diphenhydrAMINE (BENADRYL) 25 MG tablet Take 50 mg by mouth daily as needed for allergies.     ferrous sulfate 325 (65 FE) MG  tablet Take 325 mg by mouth every Monday, Wednesday, and Friday.     lidocaine-prilocaine (EMLA) cream Apply to the Port-A-Cath site 30-60 minutes before treatment 30 g 0   nitroGLYCERIN (NITROSTAT) 0.4 MG SL tablet Place 0.4 mg under the tongue every 5 (five) minutes as needed for chest pain.     omeprazole (PRILOSEC) 10 MG capsule Take 10 mg by mouth daily.     ondansetron (ZOFRAN) 8 MG tablet Take 1 tablet (8 mg total) by mouth every 8 (eight) hours as needed for nausea or vomiting. 20 tablet 0   oxymetazoline (AFRIN) 0.05 % nasal spray  Place 1 spray into both nostrils 2 (two) times daily as needed for congestion.     prochlorperazine (COMPAZINE) 10 MG tablet Take 1 tablet (10 mg total) by mouth every 6 (six) hours as needed for nausea or vomiting. 30 tablet 0   rosuvastatin (CRESTOR) 10 MG tablet Take 10 mg by mouth daily.     No current facility-administered medications for this visit.    SURGICAL HISTORY:  Past Surgical History:  Procedure Laterality Date   ABDOMINAL AORTIC ANEURYSM REPAIR  03/03/2010   AXILLARY-FEMORAL BYPASS GRAFT  07/22/2010   bilateral   bowel     herniated bowel   CARDIAC CATHETERIZATION     CORONARY STENT PLACEMENT     CYSTOSCOPY     IR IMAGING GUIDED PORT INSERTION  12/27/2020   LAMINECTOMY N/A 10/22/2020   Procedure: Lumbar five Open Laminectomy for tumor resection;  Surgeon: Judith Part, MD;  Location: [facility];  Service: Neurosurgery;  Laterality: N/A;   MOHS SURGERY     several     REVIEW OF SYSTEMS:  Constitutional: positive for fatigue Eyes: negative Ears, nose, mouth, throat, and face: negative Respiratory: positive for dyspnea on exertion Cardiovascular: negative Gastrointestinal: negative Genitourinary:negative Integument/breast: negative Hematologic/lymphatic: negative Musculoskeletal:negative Neurological: negative Behavioral/Psych: negative Endocrine: negative Allergic/Immunologic: negative   PHYSICAL EXAMINATION: General appearance: alert, cooperative, fatigued, and no distress Head: Normocephalic, without obvious abnormality, atraumatic Neck: no adenopathy, no JVD, supple, symmetrical, trachea midline, and thyroid not enlarged, symmetric, no tenderness/mass/nodules Lymph nodes: Cervical, supraclavicular, and axillary nodes normal. Resp: clear to auscultation bilaterally Back: symmetric, no curvature. ROM normal. No CVA tenderness. Cardio: regular rate and rhythm, S1, S2 normal, no murmur, click, rub or gallop GI: soft, non-tender; bowel sounds normal; no  masses,  no organomegaly Extremities: extremities normal, atraumatic, no cyanosis or edema Neurologic: Alert and oriented X 3, normal strength and tone. Normal symmetric reflexes. Normal coordination and gait  ECOG PERFORMANCE STATUS: 1 - Symptomatic but completely ambulatory  Blood pressure 121/63, pulse 94, temperature 98.7 F (37.1 C), temperature source Temporal, resp. rate 18, height 5' 9"  (1.753 m), weight 158 lb 3.2 oz (71.8 kg), SpO2 90 %.  LABORATORY DATA: Lab Results  Component Value Date   WBC 3.7 (L) 07/16/2021   HGB 13.6 07/16/2021   HCT 41.1 07/16/2021   MCV 91.5 07/16/2021   PLT 74 (L) 07/16/2021      Chemistry      Component Value Date/Time   NA 140 07/16/2021 1049   K 4.8 07/16/2021 1049   CL 111 07/16/2021 1049   CO2 20 (L) 07/16/2021 1049   BUN 24 (H) 07/16/2021 1049   CREATININE 1.21 07/16/2021 1049      Component Value Date/Time   CALCIUM 9.1 07/16/2021 1049   ALKPHOS 76 07/16/2021 1049   AST 10 (L) 07/16/2021 1049   ALT 5 07/16/2021 1049   BILITOT  0.2 (L) 07/16/2021 1049       RADIOGRAPHIC STUDIES: CT Chest W Contrast  Result Date: 08/11/2021 CLINICAL DATA:  Small-cell lung cancer.  Restaging. EXAM: CT CHEST, ABDOMEN, AND PELVIS WITH CONTRAST TECHNIQUE: Multidetector CT imaging of the chest, abdomen and pelvis was performed following the standard protocol during bolus administration of intravenous contrast. CONTRAST:  47m OMNIPAQUE IOHEXOL 350 MG/ML SOLN COMPARISON:  05/26/2021 FINDINGS: CT CHEST FINDINGS Cardiovascular: The heart size is normal. No substantial pericardial effusion. Coronary artery calcification is evident. Moderate atherosclerotic calcification is noted in the wall of the thoracic aorta. Right Port-A-Cath tip is positioned in the distal SVC. Extra anatomic bypass grafts noted in the thoracic sidewall bilaterally. Mediastinum/Nodes: No mediastinal lymphadenopathy. There is no hilar lymphadenopathy. The esophagus has normal imaging  features. There is no axillary lymphadenopathy. Lungs/Pleura: Centrilobular and paraseptal emphysema evident. 3 mm right upper lobe nodule on 64/4 is new in the interval no other new or suspicious pulmonary nodule/mass. No focal airspace consolidation. No pleural effusion. Musculoskeletal: Similar appearance of bilateral sclerotic rib lesions including posterior right seventh rib with associated fracture nonunion and sclerotic anterior left fourth rib. CT ABDOMEN PELVIS FINDINGS Hepatobiliary: Scattered tiny hypodensities in the liver measure up to 9 mm, too small to characterize but unchanged in the interval. Gallbladder is distended. Intra and extrahepatic biliary duct dilatation again noted with common bile duct measuring 13 mm in the head of the pancreas. Pancreas: No focal mass lesion. No dilatation of the main duct. No intraparenchymal cyst. No peripancreatic edema. Spleen: No splenomegaly. No focal mass lesion. Adrenals/Urinary Tract: Interval decrease in both adrenal nodules with now only some minimal adrenal thickening evident. Bilateral nonobstructing renal stones again identified with bilateral renal cysts evident. Scattered tiny hypoattenuating lesions in both kidneys are too small to characterize but likely benign. No evidence for hydroureter. The urinary bladder appears normal for the degree of distention. Stomach/Bowel: Large hiatal hernia. Duodenum is normally positioned as is the ligament of Treitz. No small bowel wall thickening. No small bowel dilatation. The terminal ileum is normal. The appendix is normal. No gross colonic mass. No colonic wall thickening. Diverticular changes are noted in the left colon without evidence of diverticulitis. Vascular/Lymphatic: Chronic infrarenal aortic occlusion. There is no gastrohepatic or hepatoduodenal ligament lymphadenopathy. No retroperitoneal or mesenteric lymphadenopathy. No pelvic sidewall lymphadenopathy. Reproductive: The prostate gland and seminal  vesicles are unremarkable. Other: No intraperitoneal free fluid. Musculoskeletal: Stable sclerotic lesion involving the right acetabular and L5 vertebral body. IMPRESSION: 1. 3 mm right upper lobe pulmonary nodule is new in the interval. 1 nonspecific, close attention on follow-up recommended. 2. Bilateral adrenal nodules seen previously have decreased substantially in the interval with now only some minimal adrenal thickening evident bilaterally. 3. Stable appearance of scattered tiny hypoattenuating liver lesions. 4. Large hiatal hernia. 5. Intra and extrahepatic biliary duct dilatation, similar to prior. 6. Bilateral nonobstructing renal stones with bilateral renal cysts. 7. Chronic infrarenal aortic occlusion. 8. Aortic Atherosclerosis (ICD10-I70.0) and Emphysema (ICD10-J43.9). Electronically Signed   By: EMisty StanleyM.D.   On: 08/11/2021 11:29   CT Abdomen Pelvis W Contrast  Result Date: 08/11/2021 CLINICAL DATA:  Small-cell lung cancer.  Restaging. EXAM: CT CHEST, ABDOMEN, AND PELVIS WITH CONTRAST TECHNIQUE: Multidetector CT imaging of the chest, abdomen and pelvis was performed following the standard protocol during bolus administration of intravenous contrast. CONTRAST:  863mOMNIPAQUE IOHEXOL 350 MG/ML SOLN COMPARISON:  05/26/2021 FINDINGS: CT CHEST FINDINGS Cardiovascular: The heart size is normal. No substantial pericardial effusion.  Coronary artery calcification is evident. Moderate atherosclerotic calcification is noted in the wall of the thoracic aorta. Right Port-A-Cath tip is positioned in the distal SVC. Extra anatomic bypass grafts noted in the thoracic sidewall bilaterally. Mediastinum/Nodes: No mediastinal lymphadenopathy. There is no hilar lymphadenopathy. The esophagus has normal imaging features. There is no axillary lymphadenopathy. Lungs/Pleura: Centrilobular and paraseptal emphysema evident. 3 mm right upper lobe nodule on 64/4 is new in the interval no other new or suspicious  pulmonary nodule/mass. No focal airspace consolidation. No pleural effusion. Musculoskeletal: Similar appearance of bilateral sclerotic rib lesions including posterior right seventh rib with associated fracture nonunion and sclerotic anterior left fourth rib. CT ABDOMEN PELVIS FINDINGS Hepatobiliary: Scattered tiny hypodensities in the liver measure up to 9 mm, too small to characterize but unchanged in the interval. Gallbladder is distended. Intra and extrahepatic biliary duct dilatation again noted with common bile duct measuring 13 mm in the head of the pancreas. Pancreas: No focal mass lesion. No dilatation of the main duct. No intraparenchymal cyst. No peripancreatic edema. Spleen: No splenomegaly. No focal mass lesion. Adrenals/Urinary Tract: Interval decrease in both adrenal nodules with now only some minimal adrenal thickening evident. Bilateral nonobstructing renal stones again identified with bilateral renal cysts evident. Scattered tiny hypoattenuating lesions in both kidneys are too small to characterize but likely benign. No evidence for hydroureter. The urinary bladder appears normal for the degree of distention. Stomach/Bowel: Large hiatal hernia. Duodenum is normally positioned as is the ligament of Treitz. No small bowel wall thickening. No small bowel dilatation. The terminal ileum is normal. The appendix is normal. No gross colonic mass. No colonic wall thickening. Diverticular changes are noted in the left colon without evidence of diverticulitis. Vascular/Lymphatic: Chronic infrarenal aortic occlusion. There is no gastrohepatic or hepatoduodenal ligament lymphadenopathy. No retroperitoneal or mesenteric lymphadenopathy. No pelvic sidewall lymphadenopathy. Reproductive: The prostate gland and seminal vesicles are unremarkable. Other: No intraperitoneal free fluid. Musculoskeletal: Stable sclerotic lesion involving the right acetabular and L5 vertebral body. IMPRESSION: 1. 3 mm right upper lobe  pulmonary nodule is new in the interval. 1 nonspecific, close attention on follow-up recommended. 2. Bilateral adrenal nodules seen previously have decreased substantially in the interval with now only some minimal adrenal thickening evident bilaterally. 3. Stable appearance of scattered tiny hypoattenuating liver lesions. 4. Large hiatal hernia. 5. Intra and extrahepatic biliary duct dilatation, similar to prior. 6. Bilateral nonobstructing renal stones with bilateral renal cysts. 7. Chronic infrarenal aortic occlusion. 8. Aortic Atherosclerosis (ICD10-I70.0) and Emphysema (ICD10-J43.9). Electronically Signed   By: Misty Stanley M.D.   On: 08/11/2021 11:29   VAS [place] ABI WITH/WO TBI  Result Date: 07/28/2021  LOWER EXTREMITY DOPPLER STUDY Patient Name:  Gregory Hodges  Date of Exam:   07/28/2021 Medical Rec #: [id number]       Accession #:    [id number] Date of Birth: 03-18-1941        Patient Gender: M Patient Age:   33 years Exam Location:  [facility] Procedure:      VAS [place] ABI WITH/WO TBI Referring Phys: Harold Barban --------------------------------------------------------------------------------  Indications: Peripheral artery disease. High Risk Factors: Hypertension.  Vascular Interventions: Bilateral ax-fem bypass grafts following infected aortic                         graft on July 22 2010. Performing Technologist: Ralene Cork RVT  Examination Guidelines: A complete evaluation includes at minimum, Doppler waveform signals and systolic blood pressure  reading at the level of bilateral brachial, anterior tibial, and posterior tibial arteries, when vessel segments are accessible. Bilateral testing is considered an integral part of a complete examination. Photoelectric Plethysmograph (PPG) waveforms and toe systolic pressure readings are included as required and additional duplex testing as needed. Limited examinations for reoccurring indications may be performed as noted.  ABI Findings:  +---------+------------------+-----+--------+--------+ Right    Rt Pressure (mmHg)IndexWaveformComment  +---------+------------------+-----+--------+--------+ Brachial 102                                     +---------+------------------+-----+--------+--------+ PTA      100               0.93 biphasic         +---------+------------------+-----+--------+--------+ DP       107               1.00 biphasic         +---------+------------------+-----+--------+--------+ Great Toe62                0.58                  +---------+------------------+-----+--------+--------+ +---------+------------------+-----+--------+-------+ Left     Lt Pressure (mmHg)IndexWaveformComment +---------+------------------+-----+--------+-------+ Brachial 107                                    +---------+------------------+-----+--------+-------+ PTA      105               0.98 biphasic        +---------+------------------+-----+--------+-------+ DP       99                0.93 biphasic        +---------+------------------+-----+--------+-------+ Great Toe72                0.67                 +---------+------------------+-----+--------+-------+ +-------+-----------+-----------+------------+------------+ ABI/TBIToday's ABIToday's TBIPrevious ABIPrevious TBI +-------+-----------+-----------+------------+------------+ Right  1          0.58       0.97        0.49         +-------+-----------+-----------+------------+------------+ Left   0.98       0.67       1.03        0.76         +-------+-----------+-----------+------------+------------+  Previous ABI on 10/07/20.  Summary: Right: Resting right ankle-brachial index is within normal range. No evidence of significant right lower extremity arterial disease. The right toe-brachial index is abnormal. Left: Resting left ankle-brachial index is within normal range. No evidence of significant left lower extremity  arterial disease. The left toe-brachial index is abnormal.  *See table(s) above for measurements and observations.  Electronically signed by Harold Barban MD on 07/28/2021 at 5:12:09 PM.    Final    VAS US CAROTID  Result Date: 07/28/2021 Carotid Arterial Duplex Study Patient Name:  Gregory Hodges  Date of Exam:   07/28/2021 Medical Rec #: [id number]       Accession #:    [id number] Date of Birth: 19-Feb-1941        Patient Gender: M Patient Age:   59 years Exam Location:  [facility] Procedure:      VAS US CAROTID Referring Phys: Durene Fruits  BRABHAM --------------------------------------------------------------------------------  Indications:       Carotid artery disease. Other Factors:     History of bilateral axillary bi-fem bypass grafts for repair                    of infected aortic graft. Comparison Study:  No previous exam. Performing Technologist: Ralene Cork RVT  Examination Guidelines: A complete evaluation includes B-mode imaging, spectral Doppler, color Doppler, and power Doppler as needed of all accessible portions of each vessel. Bilateral testing is considered an integral part of a complete examination. Limited examinations for reoccurring indications may be performed as noted.  Right Carotid Findings: +----------+--------+--------+--------+-------------------------+--------+           PSV cm/sEDV cm/sStenosisPlaque Description       Comments +----------+--------+--------+--------+-------------------------+--------+ CCA Prox  59      10                                                +----------+--------+--------+--------+-------------------------+--------+ CCA Mid   61      15              heterogenous                      +----------+--------+--------+--------+-------------------------+--------+ CCA Distal51      13              heterogenous                      +----------+--------+--------+--------+-------------------------+--------+ ICA Prox  285      81      60-79%  heterogenous and calcific         +----------+--------+--------+--------+-------------------------+--------+ ICA Mid   46      19                                                +----------+--------+--------+--------+-------------------------+--------+ ICA Distal39      17                                                +----------+--------+--------+--------+-------------------------+--------+ ECA       295     52      >50%    heterogenous and calcific         +----------+--------+--------+--------+-------------------------+--------+ +----------+--------+-------+--------+-------------------+           PSV cm/sEDV cmsDescribeArm Pressure (mmHG) +----------+--------+-------+--------+-------------------+ Subclavian226            Stenotic                    +----------+--------+-------+--------+-------------------+ +---------+--------+--+--------+--+---------+ VertebralPSV cm/s34EDV cm/s10Antegrade +---------+--------+--+--------+--+---------+  Left Carotid Findings: +----------+--------+--------+--------+-------------------------+--------+           PSV cm/sEDV cm/sStenosisPlaque Description       Comments +----------+--------+--------+--------+-------------------------+--------+ CCA Prox  104     23              heterogenous                      +----------+--------+--------+--------+-------------------------+--------+ CCA Mid   79      21  heterogenous                      +----------+--------+--------+--------+-------------------------+--------+ CCA Distal89      28              heterogenous and calcific         +----------+--------+--------+--------+-------------------------+--------+ ICA Prox  89      26      1-39%   heterogenous                      +----------+--------+--------+--------+-------------------------+--------+ ICA Mid   93      26                                                 +----------+--------+--------+--------+-------------------------+--------+ ICA Distal88      29                                                +----------+--------+--------+--------+-------------------------+--------+ ECA       176     28              calcific                          +----------+--------+--------+--------+-------------------------+--------+ +----------+--------+--------+----------------+-------------------+           PSV cm/sEDV cm/sDescribe        Arm Pressure (mmHG) +----------+--------+--------+----------------+-------------------+ [id number]              Multiphasic, WNL                    +----------+--------+--------+----------------+-------------------+ +---------+--------+--+--------+--+---------+ VertebralPSV cm/s72EDV cm/s18Antegrade +---------+--------+--+--------+--+---------+   Summary: Right Carotid: Velocities in the right ICA are consistent with a 60-79%                stenosis. The ECA appears >50% stenosed. Left Carotid: Velocities in the left ICA are consistent with a 1-39% stenosis. Vertebrals:  Bilateral vertebral arteries demonstrate antegrade flow. Subclavians: Right subclavian artery was stenotic. Normal flow hemodynamics were              seen in the left subclavian artery. *See table(s) above for measurements and observations.  Electronically signed by Harold Barban MD on 07/28/2021 at 5:12:00 PM.    Final      ASSESSMENT AND PLAN: This is a very pleasant 80  years old white male recently diagnosed with extensive stage (T2 a, N2, M1 C) small cell lung cancer presented with right upper lobe lesion with extensive mediastinal and right hilar adenopathy in addition to extensive bone metastasis as well as metastatic disease to the adrenal gland bilaterally and multiple brain lesions diagnosed in February 2022 status post resection of the epidural tumor at the L5 as well as palliative radiotherapy to the lesion after resection. The patient  is currently undergoing systemic chemotherapy with carboplatin for AUC of 5 on day 1, etoposide 100 Mg/M2 on days 1, 2 and 3 with Cosela on the days of the chemotherapy as well as Imfinzi every 3 weeks.  Status post 9 cycles.  Starting from cycle #5 the patient is on maintenance treatment with Imfinzi 1500 Mg IV every 4 weeks. The patient has been  tolerating his treatment well with no concerning adverse effect except for mild fatigue. He completed whole brain irradiation under the care of Dr. Lisbeth Renshaw. The patient had repeat CT scan of the chest, abdomen pelvis performed recently.  I personally and independently reviewed the scans and discussed the results with the patient today. His scan showed no concerning findings for disease progression but there was 3 mm nodule in the right upper lobe that nonspecific and require close monitoring on the upcoming imaging studies. I recommended for the patient to continue his current treatment with Imfinzi and he will proceed with cycle #10 today. He will come back for follow-up visit in 4 weeks for evaluation before the next cycle of his treatment. The patient was advised to call immediately if he has any other concerning symptoms in the interval.  All questions were answered. The patient knows to call the clinic with any problems, questions or concerns. We can certainly see the patient much sooner if necessary.   Disclaimer: This note was dictated with voice recognition software. Similar sounding words can inadvertently be transcribed and may not be corrected upon review.

## 2021-08-12 NOTE — Progress Notes (Signed)
I spoke to patient today during his visit with Dr. Julien Nordmann.  He had no complaints for me today.  No barriers identified at this time.

## 2021-08-12 NOTE — Progress Notes (Signed)
Per Dr. Julien Nordmann , it is ok to treat pt today with Imfinzi and platelets of 79K.

## 2021-08-19 DIAGNOSIS — Z23 Encounter for immunization: Secondary | ICD-10-CM | POA: Diagnosis not present

## 2021-08-29 ENCOUNTER — Emergency Department (HOSPITAL_COMMUNITY): Payer: PPO

## 2021-08-29 ENCOUNTER — Emergency Department (HOSPITAL_COMMUNITY)
Admission: EM | Admit: 2021-08-29 | Discharge: 2021-08-29 | Disposition: A | Discharge status: Home / Self Care | Payer: PPO | Attending: Emergency Medicine | Admitting: Emergency Medicine

## 2021-08-29 ENCOUNTER — Other Ambulatory Visit: Payer: Self-pay

## 2021-08-29 ENCOUNTER — Encounter (HOSPITAL_COMMUNITY): Payer: Self-pay

## 2021-08-29 DIAGNOSIS — N179 Acute kidney failure, unspecified: Secondary | ICD-10-CM | POA: Insufficient documentation

## 2021-08-29 DIAGNOSIS — R0902 Hypoxemia: Secondary | ICD-10-CM | POA: Diagnosis not present

## 2021-08-29 DIAGNOSIS — F17210 Nicotine dependence, cigarettes, uncomplicated: Secondary | ICD-10-CM | POA: Insufficient documentation

## 2021-08-29 DIAGNOSIS — Z20822 Contact with and (suspected) exposure to covid-19: Secondary | ICD-10-CM | POA: Insufficient documentation

## 2021-08-29 DIAGNOSIS — I10 Essential (primary) hypertension: Secondary | ICD-10-CM | POA: Diagnosis not present

## 2021-08-29 DIAGNOSIS — Z85830 Personal history of malignant neoplasm of bone: Secondary | ICD-10-CM | POA: Insufficient documentation

## 2021-08-29 DIAGNOSIS — E860 Dehydration: Secondary | ICD-10-CM | POA: Insufficient documentation

## 2021-08-29 DIAGNOSIS — Z85118 Personal history of other malignant neoplasm of bronchus and lung: Secondary | ICD-10-CM | POA: Insufficient documentation

## 2021-08-29 DIAGNOSIS — R4182 Altered mental status, unspecified: Secondary | ICD-10-CM | POA: Diagnosis not present

## 2021-08-29 DIAGNOSIS — Z79899 Other long term (current) drug therapy: Secondary | ICD-10-CM | POA: Diagnosis not present

## 2021-08-29 DIAGNOSIS — Z7982 Long term (current) use of aspirin: Secondary | ICD-10-CM | POA: Insufficient documentation

## 2021-08-29 DIAGNOSIS — R0689 Other abnormalities of breathing: Secondary | ICD-10-CM | POA: Diagnosis not present

## 2021-08-29 DIAGNOSIS — Z85828 Personal history of other malignant neoplasm of skin: Secondary | ICD-10-CM | POA: Diagnosis not present

## 2021-08-29 DIAGNOSIS — R531 Weakness: Secondary | ICD-10-CM

## 2021-08-29 DIAGNOSIS — I959 Hypotension, unspecified: Secondary | ICD-10-CM | POA: Diagnosis not present

## 2021-08-29 DIAGNOSIS — Z85858 Personal history of malignant neoplasm of other endocrine glands: Secondary | ICD-10-CM | POA: Insufficient documentation

## 2021-08-29 DIAGNOSIS — Z85841 Personal history of malignant neoplasm of brain: Secondary | ICD-10-CM | POA: Diagnosis not present

## 2021-08-29 DIAGNOSIS — K449 Diaphragmatic hernia without obstruction or gangrene: Secondary | ICD-10-CM | POA: Diagnosis not present

## 2021-08-29 LAB — HEPATIC FUNCTION PANEL
ALT: 9 U/L (ref 0–44)
AST: 11 U/L — ABNORMAL LOW (ref 15–41)
Albumin: 3.7 g/dL (ref 3.5–5.0)
Alkaline Phosphatase: 67 U/L (ref 38–126)
Bilirubin, Direct: 0.2 mg/dL (ref 0.0–0.2)
Indirect Bilirubin: 0.3 mg/dL (ref 0.3–0.9)
Total Bilirubin: 0.5 mg/dL (ref 0.3–1.2)
Total Protein: 6 g/dL — ABNORMAL LOW (ref 6.5–8.1)

## 2021-08-29 LAB — RESP PANEL BY RT-PCR (FLU A&B, COVID) ARPGX2
Influenza A by PCR: NEGATIVE
Influenza B by PCR: NEGATIVE
SARS Coronavirus 2 by RT PCR: NEGATIVE

## 2021-08-29 LAB — BASIC METABOLIC PANEL
Anion gap: 10 (ref 5–15)
BUN: 40 mg/dL — ABNORMAL HIGH (ref 8–23)
CO2: 18 mmol/L — ABNORMAL LOW (ref 22–32)
Calcium: 8.6 mg/dL — ABNORMAL LOW (ref 8.9–10.3)
Chloride: 105 mmol/L (ref 98–111)
Creatinine, Ser: 1.8 mg/dL — ABNORMAL HIGH (ref 0.61–1.24)
GFR, Estimated: 38 mL/min — ABNORMAL LOW (ref >60)
Glucose, Bld: 74 mg/dL (ref 70–99)
Potassium: 4.8 mmol/L (ref 3.5–5.1)
Sodium: 133 mmol/L — ABNORMAL LOW (ref 135–145)

## 2021-08-29 LAB — CBC
HCT: 39.2 % (ref 39.0–52.0)
Hemoglobin: 12.8 g/dL — ABNORMAL LOW (ref 13.0–17.0)
MCH: 30.9 pg (ref 26.0–34.0)
MCHC: 32.7 g/dL (ref 30.0–36.0)
MCV: 94.7 fL (ref 80.0–100.0)
Platelets: 68 10*3/uL — ABNORMAL LOW (ref 150–400)
RBC: 4.14 MIL/uL — ABNORMAL LOW (ref 4.22–5.81)
RDW: 15.7 % — ABNORMAL HIGH (ref 11.5–15.5)
WBC: 3.5 10*3/uL — ABNORMAL LOW (ref 4.0–10.5)
nRBC: 0 % (ref 0.0–0.2)

## 2021-08-29 LAB — URINALYSIS, ROUTINE W REFLEX MICROSCOPIC
Bilirubin Urine: NEGATIVE
Glucose, UA: NEGATIVE mg/dL
Ketones, ur: NEGATIVE mg/dL
Leukocytes,Ua: NEGATIVE
Nitrite: NEGATIVE
Protein, ur: NEGATIVE mg/dL
RBC / HPF: >50 RBC/hpf — ABNORMAL HIGH (ref 0–5)
Specific Gravity, Urine: 1.023 (ref 1.005–1.030)
pH: 5 (ref 5.0–8.0)

## 2021-08-29 LAB — LACTIC ACID, PLASMA: Lactic Acid, Venous: 0.8 mmol/L (ref 0.5–1.9)

## 2021-08-29 LAB — TSH: TSH: 0.712 u[IU]/mL (ref 0.350–4.500)

## 2021-08-29 LAB — CBG MONITORING, ED: Glucose-Capillary: 74 mg/dL (ref 70–99)

## 2021-08-29 IMAGING — CT CT HEAD W/O CM
3 series · 16 of 47 positions shown, 19 images · non-contrast
Comparison: MR head [DATE]

CLINICAL DATA: Mental status change. Leg weakness for 2 days.
History of small-cell lung cancer.

EXAM:
CT HEAD WITHOUT CONTRAST
TECHNIQUE: Contiguous axial images were obtained from the base of the skull
through the vertex without intravenous contrast.

[Series 2: head wo · axial · 0.49mm/px · z∈[+1478,+1618]mm · 10 of 34 slices shown, 13 images]
[im 3/34  brain]
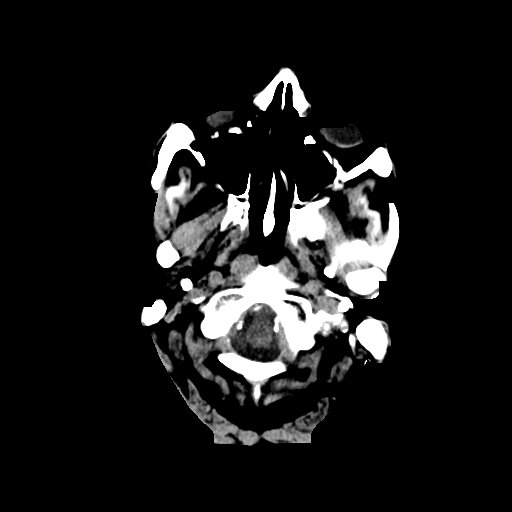
[im 3/34  bone]
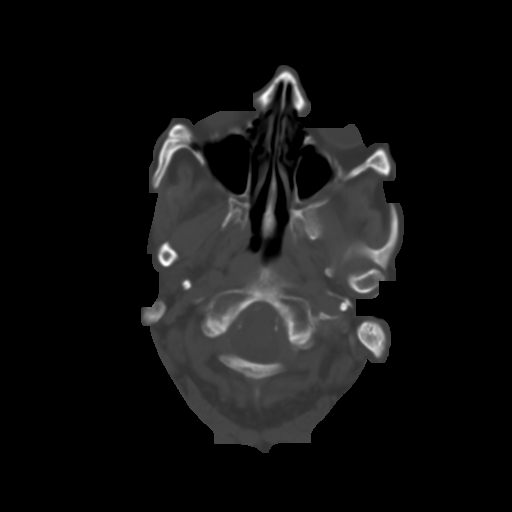
[im 6/34  brain]
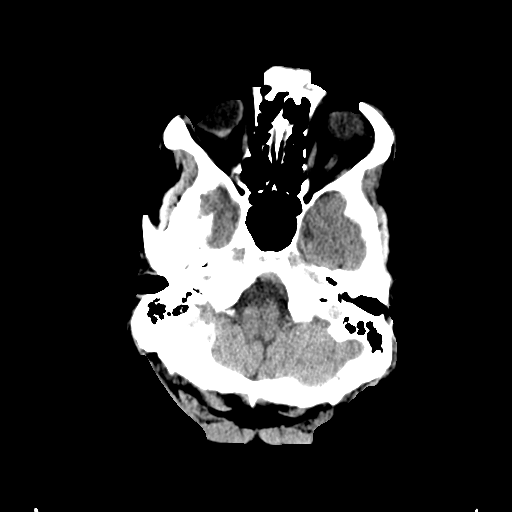
[im 10/34  brain]
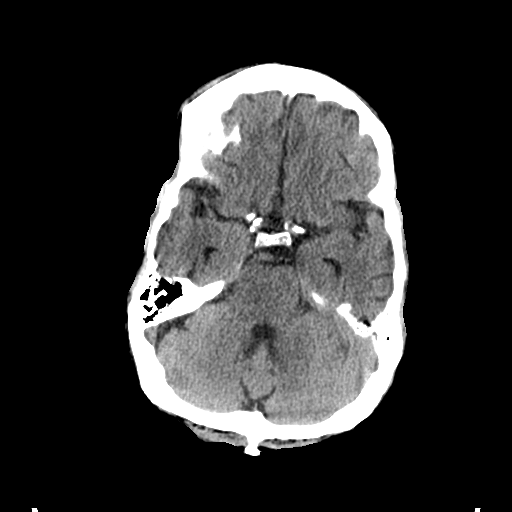
[im 12/34  brain]
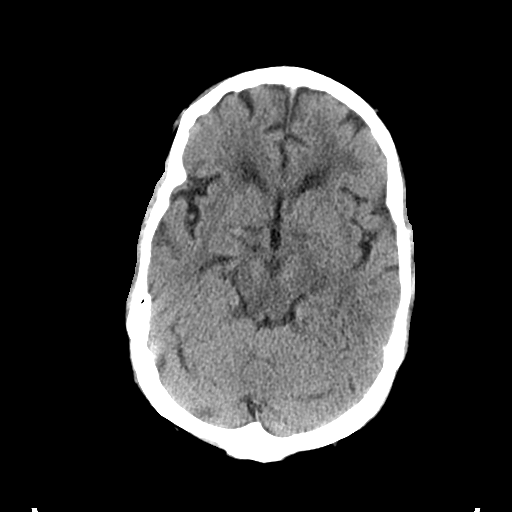
[im 15/34  brain]
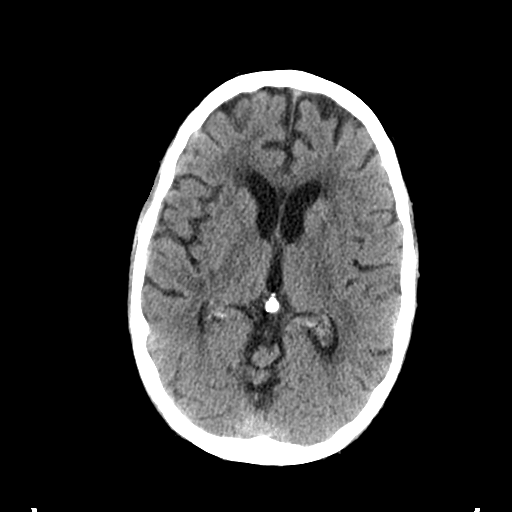
[im 15/34  bone]
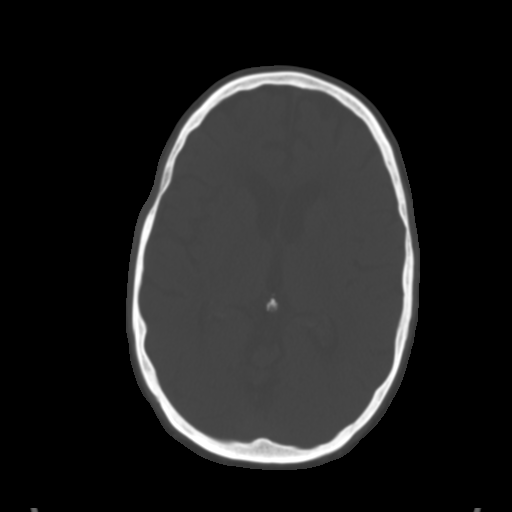
[im 19/34  brain]
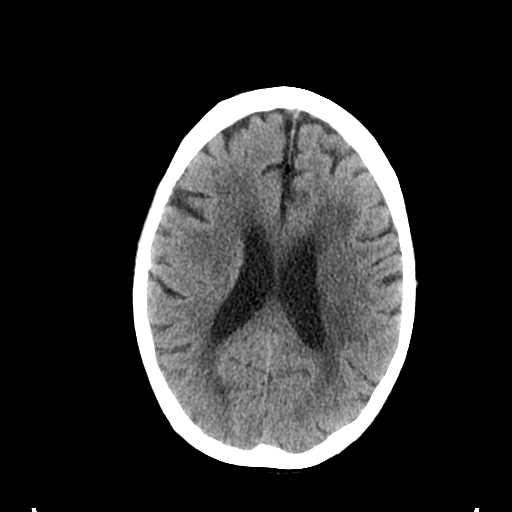
[im 22/34  brain]
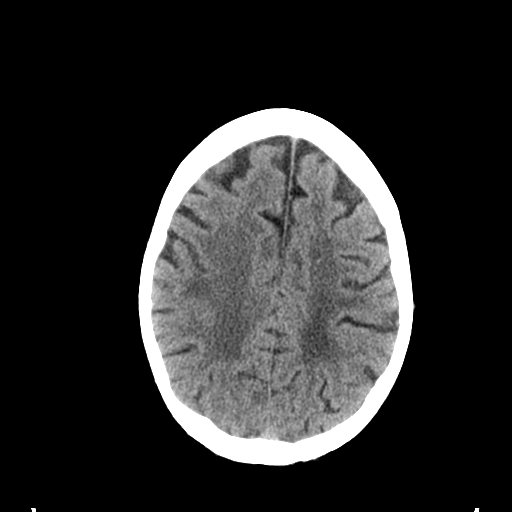
[im 26/34  brain]
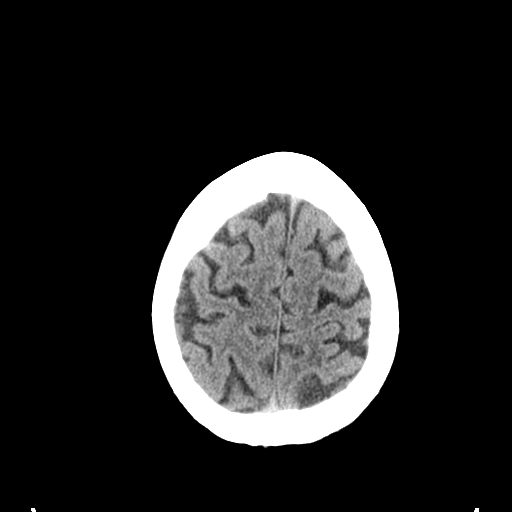
[im 28/34  brain]
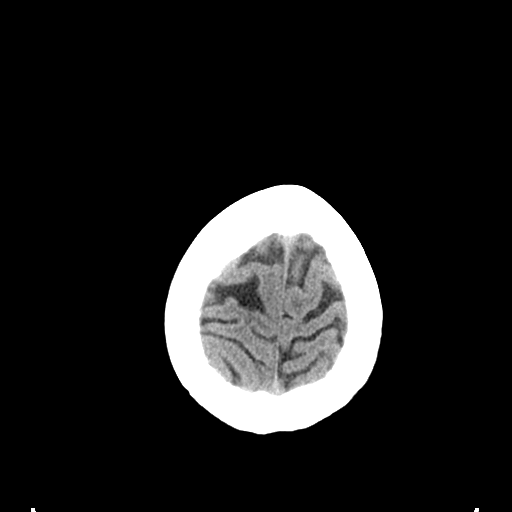
[im 28/34  bone]
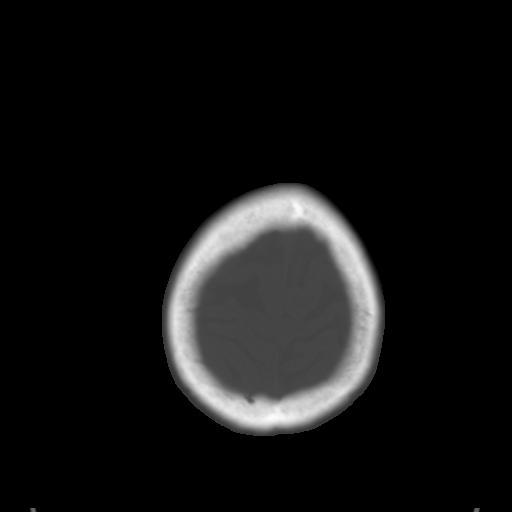
[im 31/34  brain]
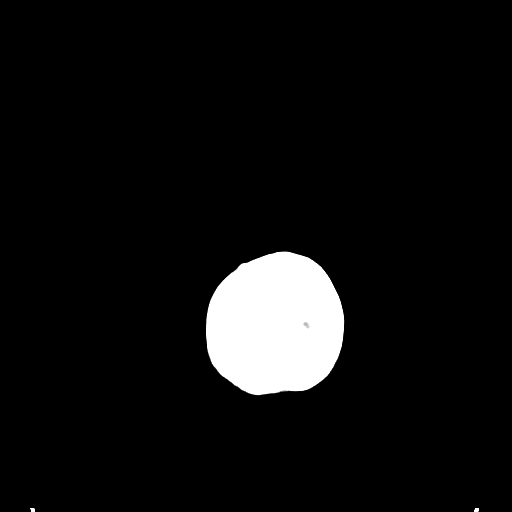

[Series 5: coronal soft tissue · coronal · 0.36mm/px · 3 of 78 slices shown]
[im 26/78  brain]
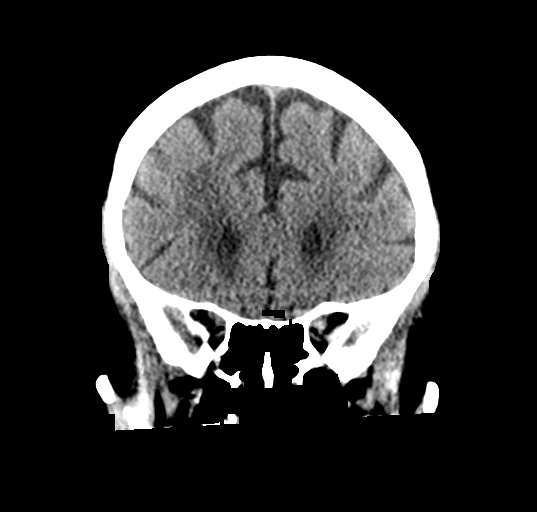
[im 35/78  brain]
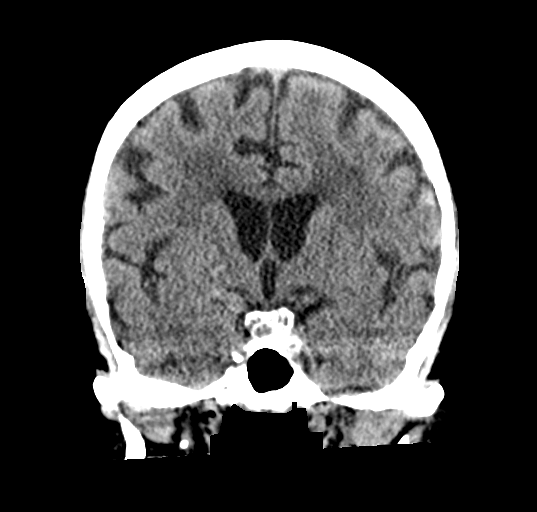
[im 43/78  brain]
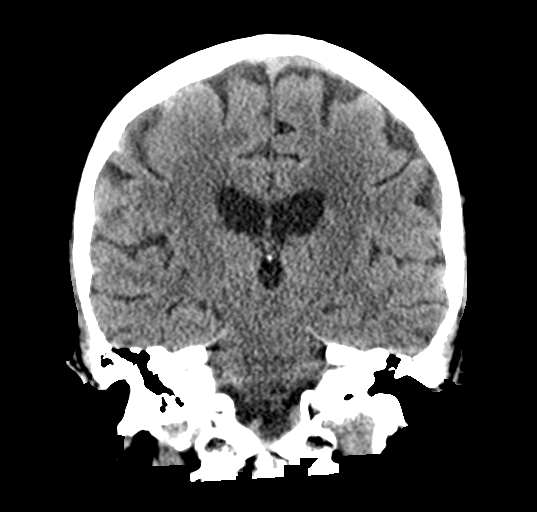

[Series 6: sagittal soft tissue · sagittal · 0.36mm/px · 3 of 59 slices shown]
[im 20/59  brain]
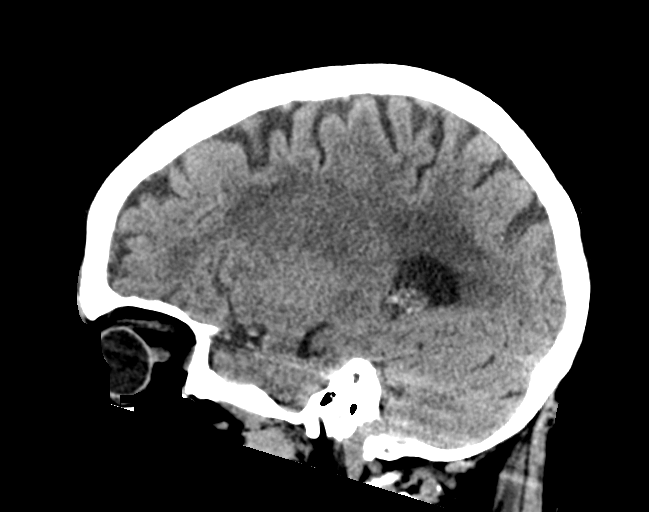
[im 30/59  brain]
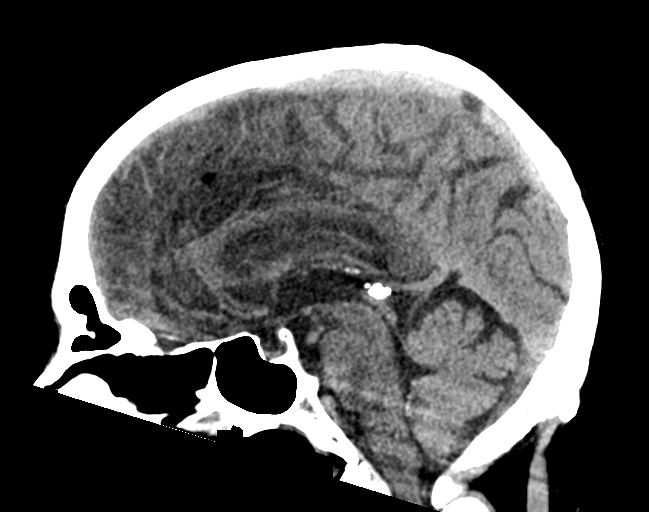
[im 39/59  brain]
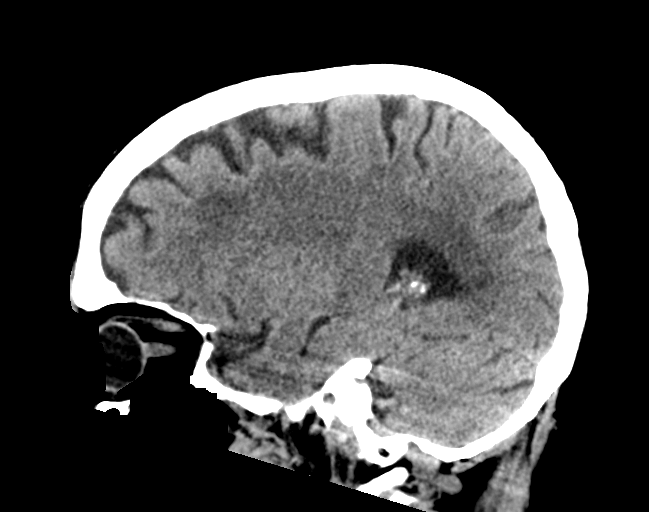

[16 of 47 positions shown; findings below may reference images not displayed]

FINDINGS: Brain: No evidence of acute infarction, hemorrhage, hydrocephalus,
extra-axial collection or mass lesion/mass effect. Hypodensities in
the periventricular and subcortical white matter, consistent with
chronic small-vessel ischemic changes.

Vascular: No hyperdense vessel or unexpected calcification.

Skull: Normal. Negative for fracture or focal lesion.

Sinuses/Orbits: No acute finding.

Other: None.
IMPRESSION: No acute intracranial abnormality.

## 2021-08-29 IMAGING — DX DG CHEST 1V PORT
1 series · 1 of 1 positions shown · non-contrast
Comparison: Chest radiograph [DATE]; CT chest [DATE]

CLINICAL DATA: Altered mental status.

EXAM:
PORTABLE CHEST 1 VIEW

[chest ap]
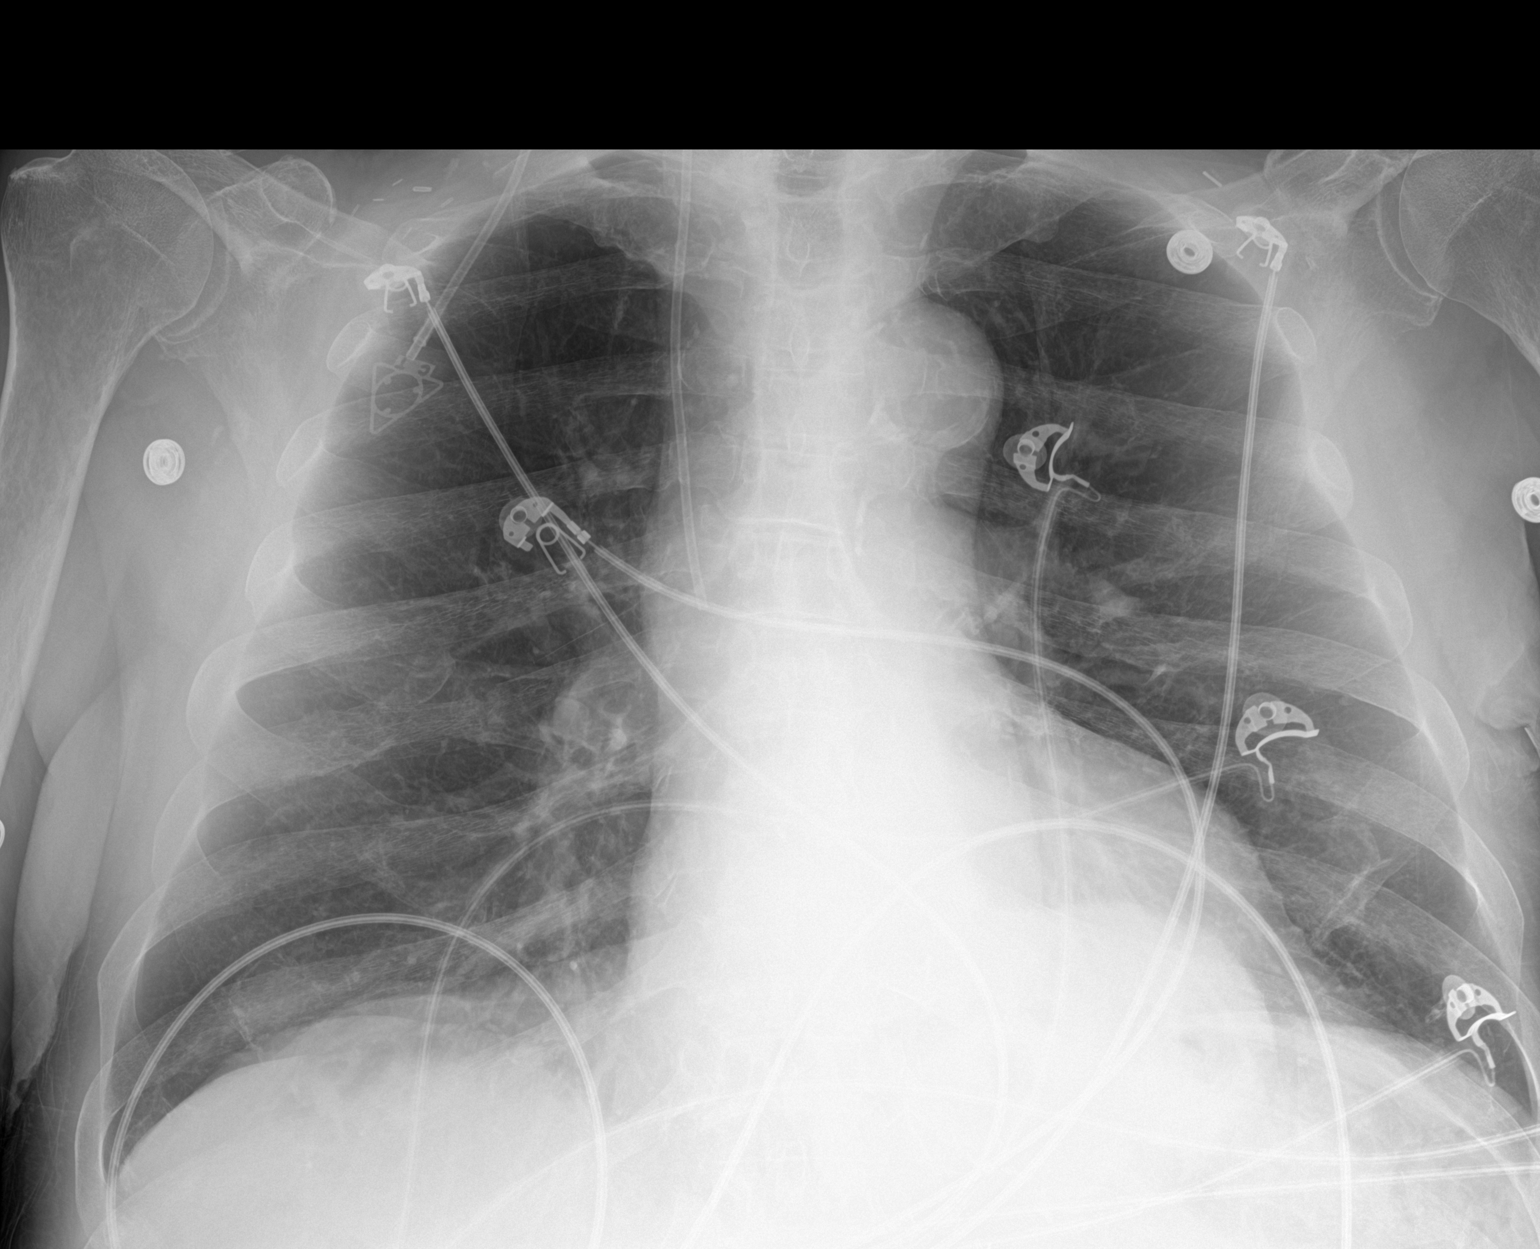

[1 of 1 positions shown; findings below may reference images not displayed]

FINDINGS: Right IJ power injectable port central venous catheter tip projects
at the level of the mid SVC. The heart size and mediastinal contours
are within normal limits. Aortic calcifications. Both lungs are
clear. Hiatal hernia. Sclerotic lesion an old fracture of the
posterior right seventh rib, better appreciated on recent
cross-sectional imaging. No acute osseous abnormality. Surgical
clips overlie the upper chest wall bilaterally.
IMPRESSION: 1. No acute cardiopulmonary abnormality.
2. Sclerotic lesion and healing fracture of the posterior right
seventh rib, better appreciated on recent cross-sectional imaging.
3. Aortic Atherosclerosis ([U3]-[U3]).
4. Hiatal hernia, stable.

## 2021-08-29 MED ORDER — SODIUM CHLORIDE 0.9 % IV BOLUS
1000.0000 mL | Freq: Once | INTRAVENOUS | Status: AC
  Administered 2021-08-29: 1000 mL via INTRAVENOUS

## 2021-08-29 MED ORDER — MEGESTROL ACETATE 625 MG/5ML PO SUSP: 625.0000 mg | Freq: Every day | ORAL | 0 refills | Status: DC

## 2021-08-29 MED ORDER — HEPARIN SOD (PORK) LOCK FLUSH 100 UNIT/ML IV SOLN
500.0000 [IU] | Freq: Once | INTRAVENOUS | Status: AC
  Administered 2021-08-29: 500 [IU]
  Filled 2021-08-29: qty 5

## 2021-08-29 NOTE — ED Triage Notes (Addendum)
Per EMS- patient is from home. Patient is a cancer patient. Patient c/o leg weakness x 2 days.  Patient denies any N/v/d or dizziness.    In triage- patient's finger tips ar cyanotic.

## 2021-08-29 NOTE — ED Notes (Signed)
Pt ambulated to restroom with stand by assist.

## 2021-08-29 NOTE — ED Provider Notes (Signed)
[facility] DEPT Provider Note   CSN: [id number] Arrival date & time: 08/29/21  0849     History Chief Complaint  Patient presents with   Weakness    Gregory Hodges is a 80 y.o. male.  Pt presents to the ED today with weakness.  Pt said he has been weak for 2 days.  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  He has had chemo.  He has had whole brain radiation and radiation to L5 and to adrenal gland mets.  Last radiation was 06/20/21.  He is currently getting immunotherapy; last dose 11/15.  He has not felt well since he had the radiation therapy, but worse for the last 2 days.  No f/c.  No n/v/d.  He denies cp or sob.  His wife said he was walking this morning leaning to the right side.  He has not had an appetite.      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   s/p Horizon study stent D1   Peripheral vascular disease ([place]) June 2011   SCLC (small cell lung carcinoma) ([place]) dx'd 10/2020   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    Patient Active Problem List   Diagnosis Date Noted   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   Brain metastases ([place]) 03/11/2021   Difficulty urinating 01/22/2021   Hypotension 01/22/2021   Port-A-Cath in place 12/31/2020   Small cell lung cancer, right ([place]) 12/03/2020   Encounter for antineoplastic chemotherapy 12/03/2020   Encounter for antineoplastic immunotherapy 12/03/2020   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    E coli bacteremia    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[place])    Peripheral vascular disease, unspecified ([place]) 10/09/2013   Yeast infection 01/26/2013   Atherosclerosis of native arteries of the extremities with intermittent claudication 03/21/2012   Wound drainage 01/13/2012   Hematuria 01/09/2011   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 08/20/2010   ACUTE AND SUBACUTE BACTERIAL ENDOCARDITIS 08/20/2010   AAA 08/20/2010   INF&INFLAM REACT DUE UNSPEC DEVICE IMPLANT&GRAFT 08/20/2010    Past Surgical History:  Procedure Laterality Date   ABDOMINAL AORTIC ANEURYSM REPAIR  03/03/2010   AXILLARY-FEMORAL BYPASS GRAFT  07/22/2010   bilateral   bowel     herniated bowel   CARDIAC CATHETERIZATION     CORONARY STENT PLACEMENT     CYSTOSCOPY     IR IMAGING GUIDED PORT INSERTION  12/27/2020   LAMINECTOMY N/A 10/22/2020   Procedure: Lumbar five Open Laminectomy for tumor resection;  Surgeon: Judith Part, MD;  Location: [facility];  Service: Neurosurgery;  Laterality: N/A;   MOHS SURGERY     several        History reviewed. No pertinent family history.  Social History   Tobacco Use   Smoking status: Every Day    Packs/day: 1.00    Years: 30.00    Pack years: 30.00    Types: Cigarettes   Smokeless tobacco: Never  Vaping  Use   Vaping Use: Never used  Substance Use Topics   Alcohol use: Not Currently    Comment: special ocassional   Drug use: No    Home Medications Prior to Admission medications   Medication Sig Start Date End Date Taking? Authorizing Provider  megestrol (MEGACE ES) 625 MG/5ML suspension Take 5 mLs (625 mg total) by mouth daily. 08/29/21  Yes Isla Pence, MD  acetaminophen (TYLENOL) 500 MG tablet Take 500 mg by mouth every 6 (six) hours as needed for moderate pain.    [provider]  aspirin 81 MG tablet Take 81 mg by mouth daily.    [provider]  atenolol (TENORMIN) 25 MG tablet Take 25 mg by mouth 2 (two) times daily.    [provider]   calcium carbonate (TUMS - DOSED IN MG ELEMENTAL CALCIUM) 500 MG chewable tablet Chew 2 tablets by mouth daily as needed for indigestion or heartburn.    [provider]  diphenhydrAMINE (BENADRYL) 25 MG tablet Take 50 mg by mouth daily as needed for allergies.    [provider]  ferrous sulfate 325 (65 FE) MG tablet Take 325 mg by mouth every Monday, Wednesday, and Friday.    [provider]  lidocaine-prilocaine (EMLA) cream Apply to the Port-A-Cath site 30-60 minutes before treatment 12/19/20   Curt Bears, MD  nitroGLYCERIN (NITROSTAT) 0.4 MG SL tablet Place 0.4 mg under the tongue every 5 (five) minutes as needed for chest pain.    [provider]  omeprazole (PRILOSEC) 10 MG capsule Take 10 mg by mouth daily. 09/30/20   [provider]  ondansetron (ZOFRAN) 8 MG tablet Take 1 tablet (8 mg total) by mouth every 8 (eight) hours as needed for nausea or vomiting. 05/29/21   Hayden Pedro, PA-C  oxymetazoline (AFRIN) 0.05 % nasal spray Place 1 spray into both nostrils 2 (two) times daily as needed for congestion.    [provider]  prochlorperazine (COMPAZINE) 10 MG tablet Take 1 tablet (10 mg total) by mouth every 6 (six) hours as needed for nausea or vomiting. 12/19/20   Curt Bears, MD  rosuvastatin (CRESTOR) 10 MG tablet Take 10 mg by mouth daily.    [provider]    Allergies    Oxycodone, Codeine, Lisinopril, and Oxycodone hcl  Review of Systems   Review of Systems  Constitutional:  Positive for appetite change.  Neurological:  Positive for weakness.  All other systems reviewed and are negative.  Physical Exam Updated Vital Signs BP (!) 108/58   Pulse 65   Temp 97.9 F (36.6 C) (Oral)   Resp 16   Ht 5' 9"  (1.753 m)   Wt 64.9 kg   SpO2 97%   BMI 21.12 kg/m   Physical Exam Vitals and nursing note reviewed.  Constitutional:      Appearance: He is underweight. He is ill-appearing.  HENT:      Head: Atraumatic.     Right Ear: External ear normal.     Left Ear: External ear normal.     Nose: Nose normal.     Mouth/Throat:     Mouth: Mucous membranes are dry.  Eyes:     Extraocular Movements: Extraocular movements intact.     Pupils: Pupils are equal, round, and reactive to light.  Cardiovascular:     Rate and Rhythm: Normal rate and regular rhythm.     Pulses: Normal pulses.     Heart sounds: Normal heart sounds.  Pulmonary:  Effort: Pulmonary effort is normal.     Breath sounds: Normal breath sounds.  Abdominal:     General: Abdomen is flat. Bowel sounds are normal.     Palpations: Abdomen is soft.  Musculoskeletal:        General: Normal range of motion.     Cervical back: Normal range of motion and neck supple.  Skin:    General: Skin is warm.     Capillary Refill: Capillary refill takes less than 2 seconds.     Comments: Skin changes to both arms.  No cyanosis.  Neurological:     General: No focal deficit present.     Mental Status: He is alert and oriented to person, place, and time.  Psychiatric:        Mood and Affect: Mood normal.        Behavior: Behavior normal.    ED Results / Procedures / Treatments   Labs (all labs ordered are listed, but only abnormal results are displayed) Labs Reviewed  BASIC METABOLIC PANEL - Abnormal; Notable for the following components:      Result Value   Sodium 133 (*)    CO2 18 (*)    BUN 40 (*)    Creatinine, Ser 1.80 (*)    Calcium 8.6 (*)    GFR, Estimated 38 (*)    All other components within normal limits  CBC - Abnormal; Notable for the following components:   WBC 3.5 (*)    RBC 4.14 (*)    Hemoglobin 12.8 (*)    RDW 15.7 (*)    Platelets 68 (*)    All other components within normal limits  URINALYSIS, ROUTINE W REFLEX MICROSCOPIC - Abnormal; Notable for the following components:   APPearance CLOUDY (*)    Hgb urine dipstick MODERATE (*)    RBC / HPF >50 (*)    Bacteria, UA RARE (*)    All other  components within normal limits  HEPATIC FUNCTION PANEL - Abnormal; Notable for the following components:   Total Protein 6.0 (*)    AST 11 (*)    All other components within normal limits  RESP PANEL BY RT-PCR (FLU A&B, COVID) ARPGX2  CULTURE, BLOOD (ROUTINE X 2)  CULTURE, BLOOD (ROUTINE X 2)  LACTIC ACID, PLASMA  TSH  CBG MONITORING, ED    EKG EKG Interpretation  Date/Time:  Friday August 29 2021 09:04:49 EST Ventricular Rate:  67 PR Interval:  181 QRS Duration: 99 QT Interval:  381 QTC Calculation: 403 R Axis:   -5 Text Interpretation: Sinus rhythm Nonspecific T abnormalities, lateral leads No significant change since last tracing Confirmed by Isla Pence [phone]) on 08/29/2021 9:34:57 AM  Radiology CT Head Wo Contrast  Result Date: 08/29/2021 CLINICAL DATA:  Mental status change. Leg weakness for 2 days. History of small-cell lung cancer. EXAM: CT HEAD WITHOUT CONTRAST TECHNIQUE: Contiguous axial images were obtained from the base of the skull through the vertex without intravenous contrast. COMPARISON:  MR head 05/26/2021 FINDINGS: Brain: No evidence of acute infarction, hemorrhage, hydrocephalus, extra-axial collection or mass lesion/mass effect. Hypodensities in the periventricular and subcortical white matter, consistent with chronic small-vessel ischemic changes. Vascular: No hyperdense vessel or unexpected calcification. Skull: Normal. Negative for fracture or focal lesion. Sinuses/Orbits: No acute finding. Other: None. IMPRESSION: No acute intracranial abnormality. Electronically Signed   By: Ileana Roup M.D.   On: 08/29/2021 10:56   DG Chest Portable 1 View  Result Date: 08/29/2021 CLINICAL DATA:  Altered mental  status. EXAM: PORTABLE CHEST 1 VIEW COMPARISON:  Chest radiograph 01/23/2021; CT chest 08/11/2021 FINDINGS: Right IJ power injectable port central venous catheter tip projects at the level of the mid SVC. The heart size and mediastinal contours are within  normal limits. Aortic calcifications. Both lungs are clear. Hiatal hernia. Sclerotic lesion an old fracture of the posterior right seventh rib, better appreciated on recent cross-sectional imaging. No acute osseous abnormality. Surgical clips overlie the upper chest wall bilaterally. IMPRESSION: 1. No acute cardiopulmonary abnormality. 2. Sclerotic lesion and healing fracture of the posterior right seventh rib, better appreciated on recent cross-sectional imaging. 3. Aortic Atherosclerosis (ICD10-I70.0). 4. Hiatal hernia, stable. Electronically Signed   By: Ileana Roup M.D.   On: 08/29/2021 09:51    Procedures Procedures   Medications Ordered in ED Medications  sodium chloride 0.9 % bolus 1,000 mL (0 mLs Intravenous Stopped 08/29/21 1130)  sodium chloride 0.9 % bolus 1,000 mL (0 mLs Intravenous Stopped 08/29/21 1254)    ED Course  I have reviewed the triage vital signs and the nursing notes.  Pertinent labs & imaging results that were available during my care of the patient were reviewed by me and considered in my medical decision making (see chart for details).    MDM Rules/Calculators/A&P                           Pt is feeling much better after fluids.  He does have some AKI, but is given 2 L.  Pt is able to ambulate without any problems.  His wife agrees he looks better.  Pt's fatigue is likely due to not eating/drinking enough.  He is interested in megace to help his appetite.  Pt is stable for d/c.  Return if worse.  F/u with pcp. Final Clinical Impression(s) / ED Diagnoses Final diagnoses:  Weakness  Dehydration  AKI (acute kidney injury) ([place])    Rx / DC Orders ED Discharge Orders          Ordered    megestrol (MEGACE ES) 625 MG/5ML suspension  Daily        08/29/21 1305             Isla Pence, MD 08/29/21 1307

## 2021-09-03 LAB — CULTURE, BLOOD (ROUTINE X 2)
Culture: NO GROWTH
Special Requests: ADEQUATE

## 2021-09-09 ENCOUNTER — Inpatient Hospital Stay: Payer: PPO | Attending: Neurological Surgery | Admitting: Internal Medicine

## 2021-09-09 ENCOUNTER — Encounter (HOSPITAL_COMMUNITY): Payer: Self-pay

## 2021-09-09 ENCOUNTER — Emergency Department (HOSPITAL_COMMUNITY)
Admission: EM | Admit: 2021-09-09 | Discharge: 2021-09-09 | Disposition: A | Discharge status: Home / Self Care | Payer: PPO | Attending: Emergency Medicine | Admitting: Emergency Medicine

## 2021-09-09 ENCOUNTER — Inpatient Hospital Stay: Payer: PPO

## 2021-09-09 ENCOUNTER — Inpatient Hospital Stay: Payer: PPO | Admitting: Dietician

## 2021-09-09 ENCOUNTER — Other Ambulatory Visit: Payer: Self-pay | Admitting: Physician Assistant

## 2021-09-09 ENCOUNTER — Telehealth: Payer: Self-pay | Admitting: Medical Oncology

## 2021-09-09 ENCOUNTER — Other Ambulatory Visit: Payer: Self-pay

## 2021-09-09 ENCOUNTER — Emergency Department (HOSPITAL_COMMUNITY): Payer: PPO

## 2021-09-09 VITALS — BP 87/53 | HR 64 | Temp 97.6°F | Wt 145.9 lb

## 2021-09-09 DIAGNOSIS — Z95828 Presence of other vascular implants and grafts: Secondary | ICD-10-CM

## 2021-09-09 DIAGNOSIS — R300 Dysuria: Secondary | ICD-10-CM | POA: Diagnosis not present

## 2021-09-09 DIAGNOSIS — Z5321 Procedure and treatment not carried out due to patient leaving prior to being seen by health care provider: Secondary | ICD-10-CM | POA: Diagnosis not present

## 2021-09-09 DIAGNOSIS — C3491 Malignant neoplasm of unspecified part of right bronchus or lung: Secondary | ICD-10-CM | POA: Diagnosis not present

## 2021-09-09 DIAGNOSIS — M47812 Spondylosis without myelopathy or radiculopathy, cervical region: Secondary | ICD-10-CM | POA: Diagnosis not present

## 2021-09-09 DIAGNOSIS — W108XXA Fall (on) (from) other stairs and steps, initial encounter: Secondary | ICD-10-CM | POA: Diagnosis not present

## 2021-09-09 DIAGNOSIS — I951 Orthostatic hypotension: Secondary | ICD-10-CM

## 2021-09-09 DIAGNOSIS — R531 Weakness: Secondary | ICD-10-CM

## 2021-09-09 DIAGNOSIS — S61412A Laceration without foreign body of left hand, initial encounter: Secondary | ICD-10-CM | POA: Insufficient documentation

## 2021-09-09 DIAGNOSIS — Z79620 Long term (current) use of immunosuppressive biologic: Secondary | ICD-10-CM | POA: Insufficient documentation

## 2021-09-09 DIAGNOSIS — I6529 Occlusion and stenosis of unspecified carotid artery: Secondary | ICD-10-CM | POA: Diagnosis not present

## 2021-09-09 DIAGNOSIS — Y92195 Garage of other specified residential institution as the place of occurrence of the external cause: Secondary | ICD-10-CM | POA: Insufficient documentation

## 2021-09-09 DIAGNOSIS — Z743 Need for continuous supervision: Secondary | ICD-10-CM | POA: Diagnosis not present

## 2021-09-09 DIAGNOSIS — J439 Emphysema, unspecified: Secondary | ICD-10-CM | POA: Diagnosis not present

## 2021-09-09 DIAGNOSIS — C3490 Malignant neoplasm of unspecified part of unspecified bronchus or lung: Secondary | ICD-10-CM | POA: Diagnosis not present

## 2021-09-09 DIAGNOSIS — C7931 Secondary malignant neoplasm of brain: Secondary | ICD-10-CM

## 2021-09-09 DIAGNOSIS — Z5112 Encounter for antineoplastic immunotherapy: Secondary | ICD-10-CM | POA: Diagnosis not present

## 2021-09-09 DIAGNOSIS — Z85828 Personal history of other malignant neoplasm of skin: Secondary | ICD-10-CM | POA: Diagnosis not present

## 2021-09-09 DIAGNOSIS — R569 Unspecified convulsions: Secondary | ICD-10-CM | POA: Diagnosis not present

## 2021-09-09 DIAGNOSIS — S6992XA Unspecified injury of left wrist, hand and finger(s), initial encounter: Secondary | ICD-10-CM | POA: Diagnosis present

## 2021-09-09 DIAGNOSIS — W19XXXA Unspecified fall, initial encounter: Secondary | ICD-10-CM | POA: Diagnosis not present

## 2021-09-09 DIAGNOSIS — S199XXA Unspecified injury of neck, initial encounter: Secondary | ICD-10-CM | POA: Diagnosis not present

## 2021-09-09 DIAGNOSIS — S0990XA Unspecified injury of head, initial encounter: Secondary | ICD-10-CM | POA: Diagnosis not present

## 2021-09-09 DIAGNOSIS — S61411A Laceration without foreign body of right hand, initial encounter: Secondary | ICD-10-CM | POA: Diagnosis not present

## 2021-09-09 DIAGNOSIS — R0902 Hypoxemia: Secondary | ICD-10-CM | POA: Diagnosis not present

## 2021-09-09 LAB — CBC WITH DIFFERENTIAL (CANCER CENTER ONLY)
Abs Immature Granulocytes: 0.01 10*3/uL (ref 0.00–0.07)
Basophils Absolute: 0 10*3/uL (ref 0.0–0.1)
Basophils Relative: 1 %
Eosinophils Absolute: 0 10*3/uL (ref 0.0–0.5)
Eosinophils Relative: 0 %
HCT: 35.6 % — ABNORMAL LOW (ref 39.0–52.0)
Hemoglobin: 11.9 g/dL — ABNORMAL LOW (ref 13.0–17.0)
Immature Granulocytes: 0 %
Lymphocytes Relative: 5 %
Lymphs Abs: 0.2 10*3/uL — ABNORMAL LOW (ref 0.7–4.0)
MCH: 31.2 pg (ref 26.0–34.0)
MCHC: 33.4 g/dL (ref 30.0–36.0)
MCV: 93.4 fL (ref 80.0–100.0)
Monocytes Absolute: 0.5 10*3/uL (ref 0.1–1.0)
Monocytes Relative: 12 %
Neutro Abs: 3.4 10*3/uL (ref 1.7–7.7)
Neutrophils Relative %: 82 %
Platelet Count: 69 10*3/uL — ABNORMAL LOW (ref 150–400)
RBC: 3.81 MIL/uL — ABNORMAL LOW (ref 4.22–5.81)
RDW: 15.9 % — ABNORMAL HIGH (ref 11.5–15.5)
WBC Count: 4.1 10*3/uL (ref 4.0–10.5)
nRBC: 0 % (ref 0.0–0.2)

## 2021-09-09 LAB — URINALYSIS, COMPLETE (UACMP) WITH MICROSCOPIC
Bilirubin Urine: NEGATIVE
Glucose, UA: NEGATIVE mg/dL
Ketones, ur: NEGATIVE mg/dL
Leukocytes,Ua: NEGATIVE
Nitrite: NEGATIVE
Protein, ur: NEGATIVE mg/dL
RBC / HPF: >50 RBC/hpf — ABNORMAL HIGH (ref 0–5)
Specific Gravity, Urine: 1.021 (ref 1.005–1.030)
pH: 5 (ref 5.0–8.0)

## 2021-09-09 LAB — CMP (CANCER CENTER ONLY)
ALT: 6 U/L (ref 0–44)
AST: 9 U/L — ABNORMAL LOW (ref 15–41)
Albumin: 3.7 g/dL (ref 3.5–5.0)
Alkaline Phosphatase: 53 U/L (ref 38–126)
Anion gap: 8 (ref 5–15)
BUN: 42 mg/dL — ABNORMAL HIGH (ref 8–23)
CO2: 17 mmol/L — ABNORMAL LOW (ref 22–32)
Calcium: 8.7 mg/dL — ABNORMAL LOW (ref 8.9–10.3)
Chloride: 115 mmol/L — ABNORMAL HIGH (ref 98–111)
Creatinine: 1.58 mg/dL — ABNORMAL HIGH (ref 0.61–1.24)
GFR, Estimated: 44 mL/min — ABNORMAL LOW (ref >60)
Glucose, Bld: 81 mg/dL (ref 70–99)
Potassium: 4.9 mmol/L (ref 3.5–5.1)
Sodium: 140 mmol/L (ref 135–145)
Total Bilirubin: 0.6 mg/dL (ref 0.3–1.2)
Total Protein: 6 g/dL — ABNORMAL LOW (ref 6.5–8.1)

## 2021-09-09 LAB — TSH: TSH: 0.589 u[IU]/mL (ref 0.320–4.118)

## 2021-09-09 IMAGING — CR DG HAND COMPLETE 3+V*R*
3 series · 3 of 3 positions shown · non-contrast
Comparison: None.

CLINICAL DATA: Fall, hand pain, laceration

EXAM:
RIGHT HAND - COMPLETE 3+ VIEW

[x hand pa right]
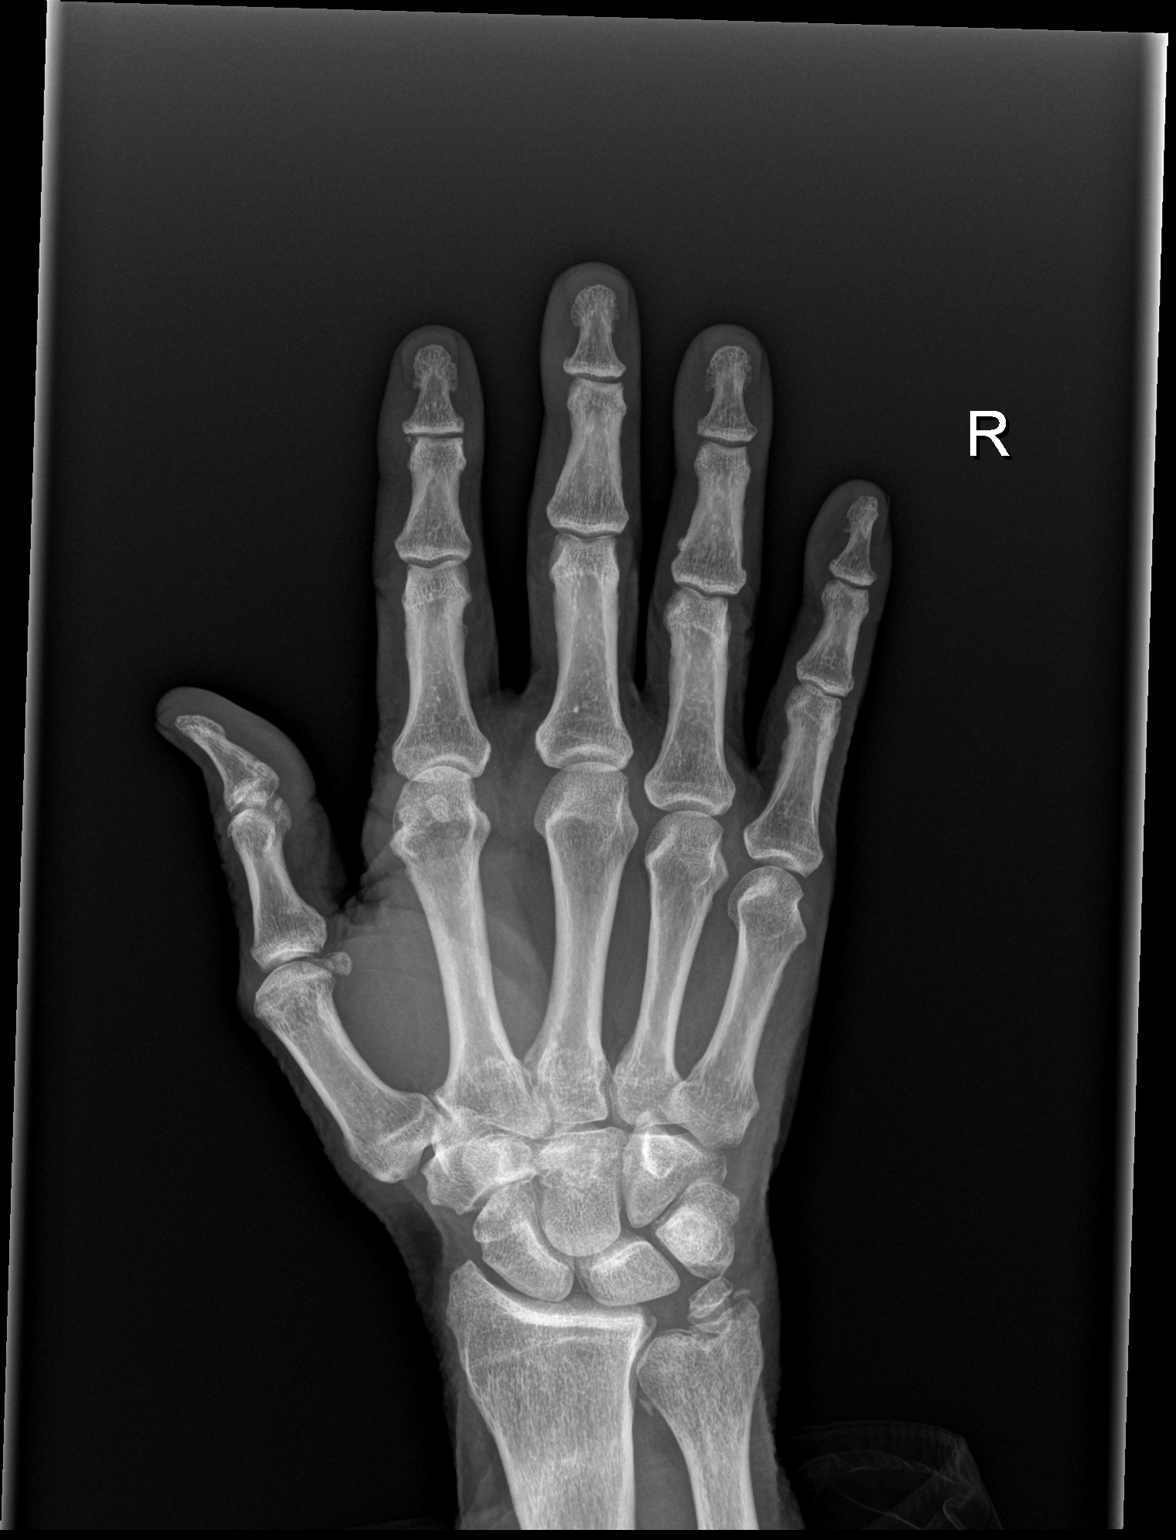

[x hand obl right]
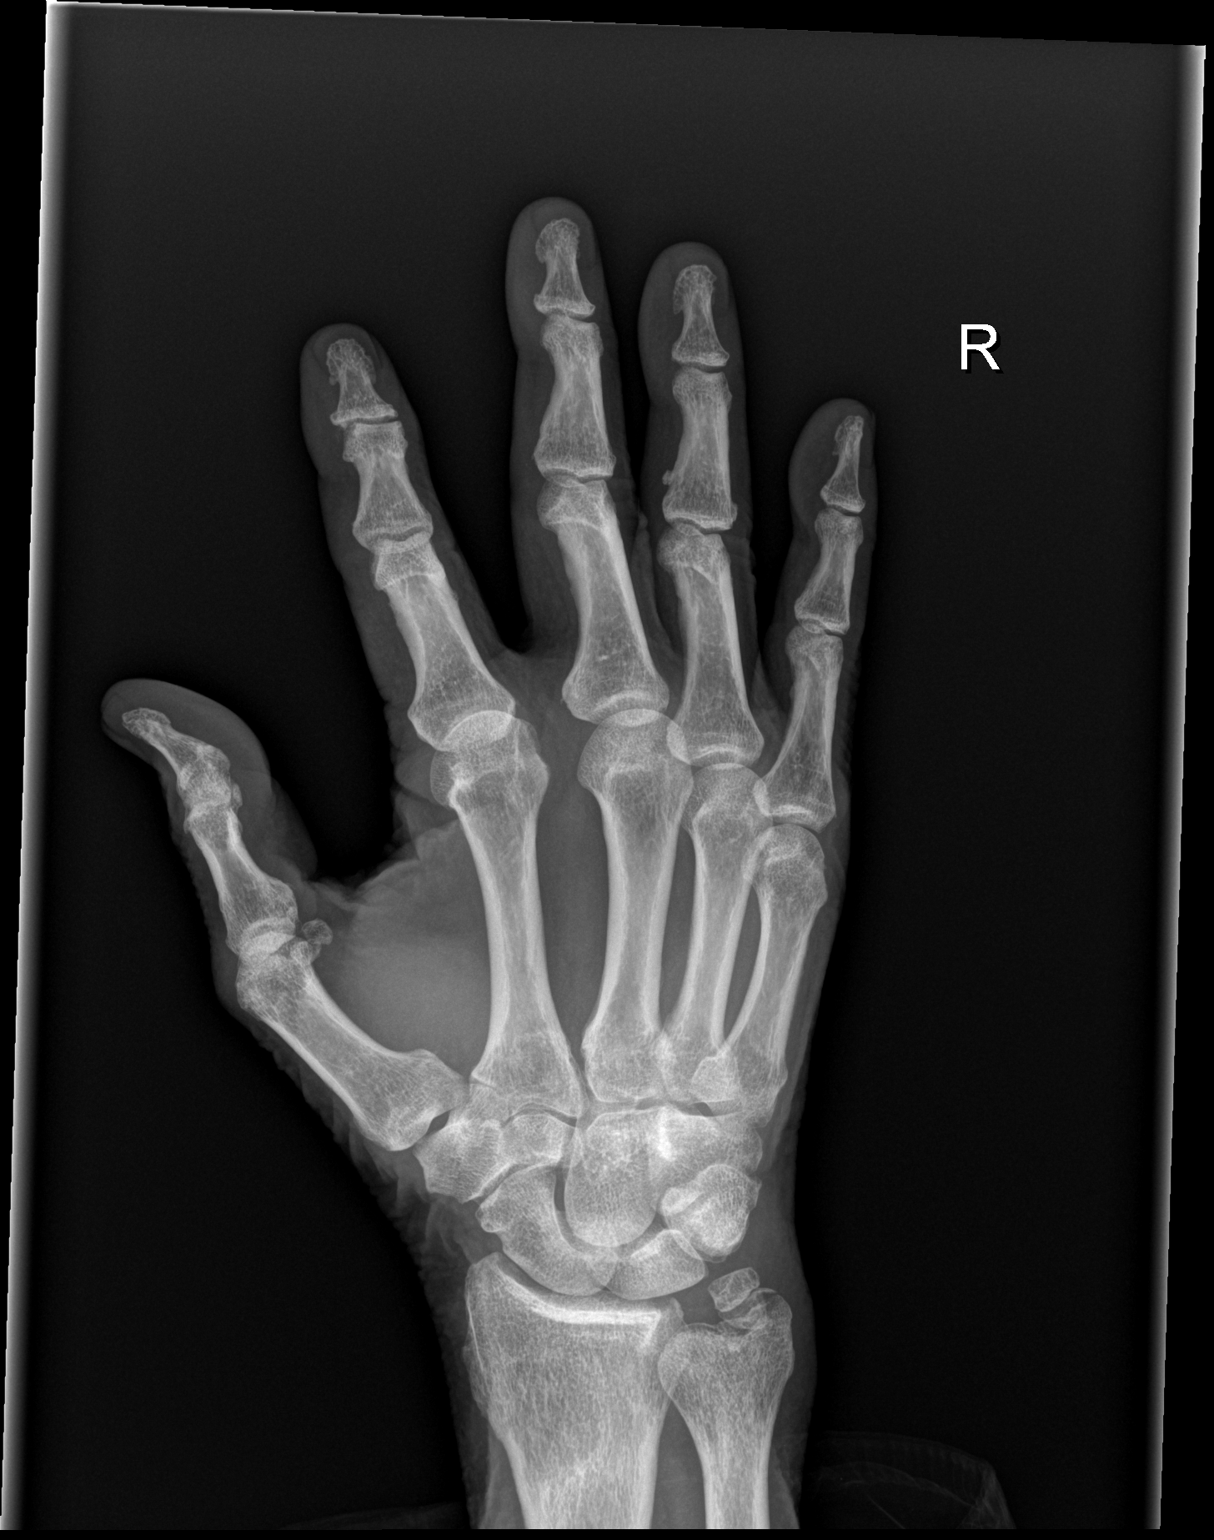

[x hand lat right]
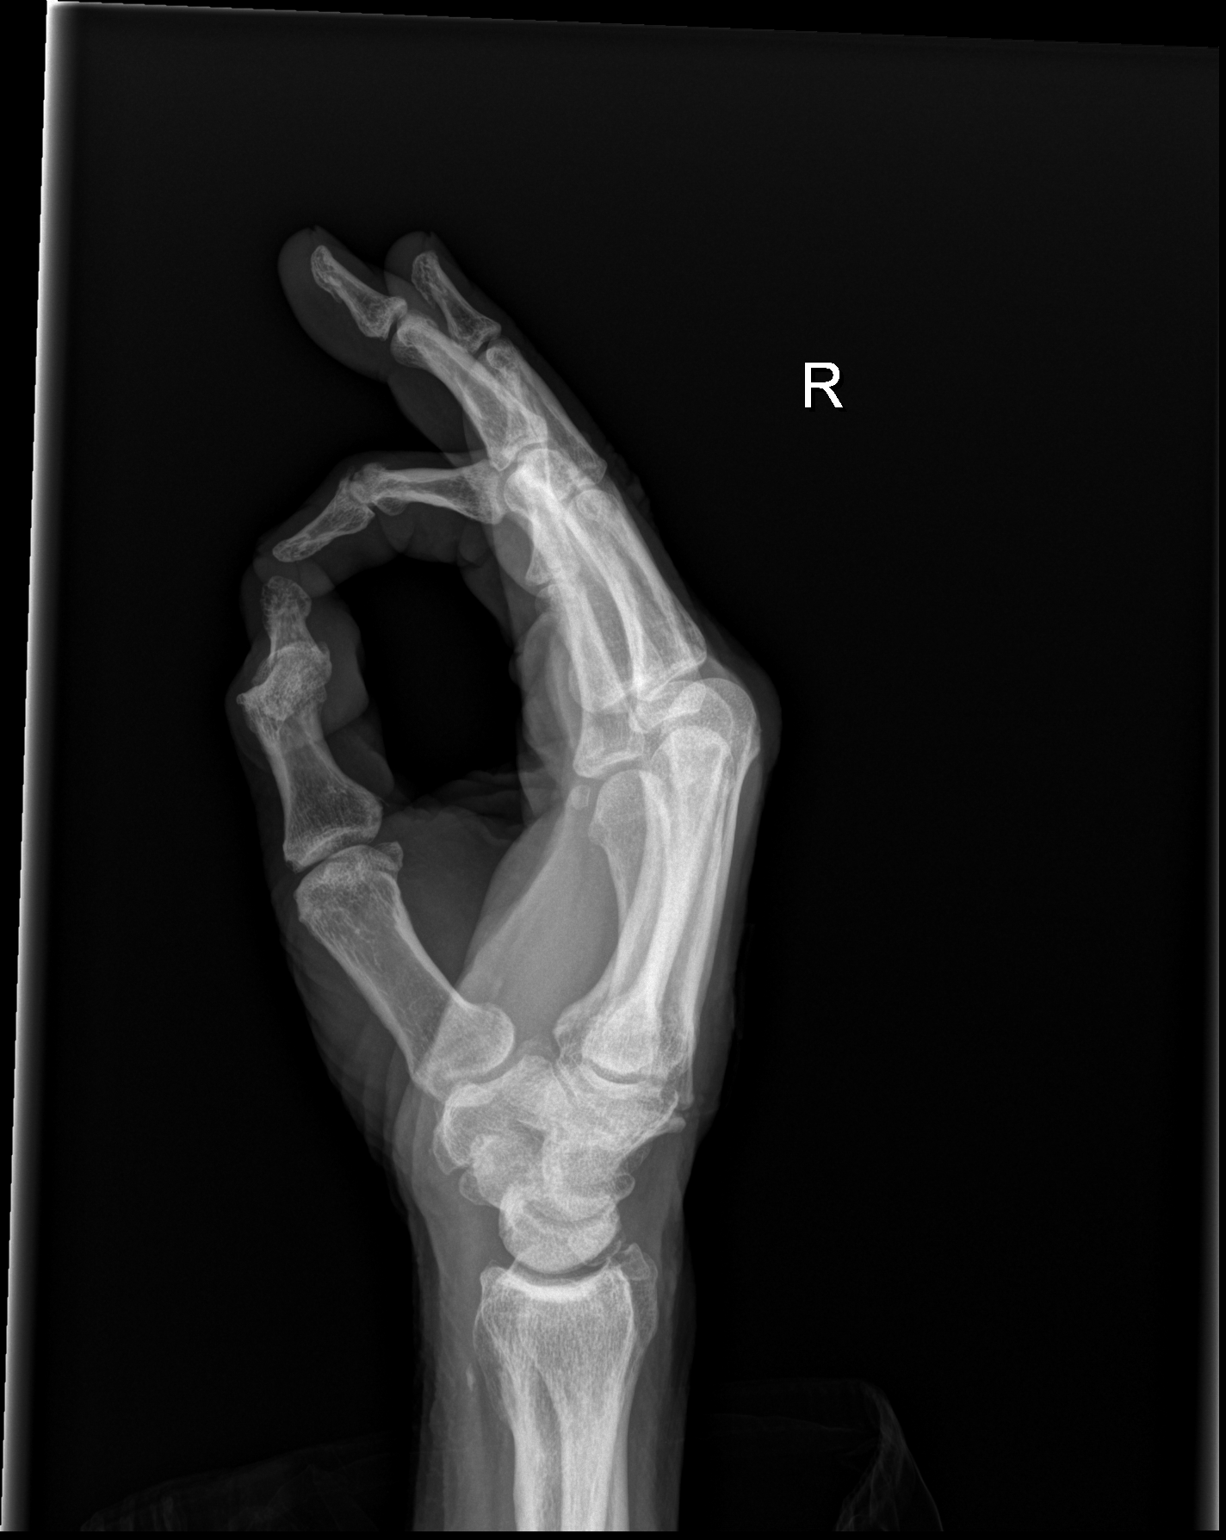

[3 of 3 positions shown; findings below may reference images not displayed]

FINDINGS: No acute bony abnormality. Specifically, no fracture, subluxation,
or dislocation. Joint spaces maintained. Old ulnar styloid fracture.
Soft tissues are intact.
IMPRESSION: No acute bony abnormality.

## 2021-09-09 IMAGING — CT CT HEAD W/O CM
3 series · 16 of 47 positions shown, 19 images · non-contrast
Comparison: [DATE]

CLINICAL DATA: Fell, hit head

EXAM:
CT HEAD WITHOUT CONTRAST
TECHNIQUE: Contiguous axial images were obtained from the base of the skull
through the vertex without intravenous contrast.

[Series 3: head wo · axial · 0.47mm/px · z∈[-113,+27]mm · 10 of 34 slices shown, 13 images]
[im 3/34  brain]
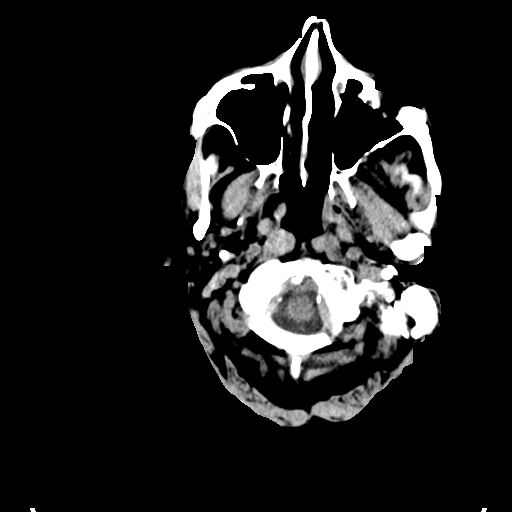
[im 3/34  bone]
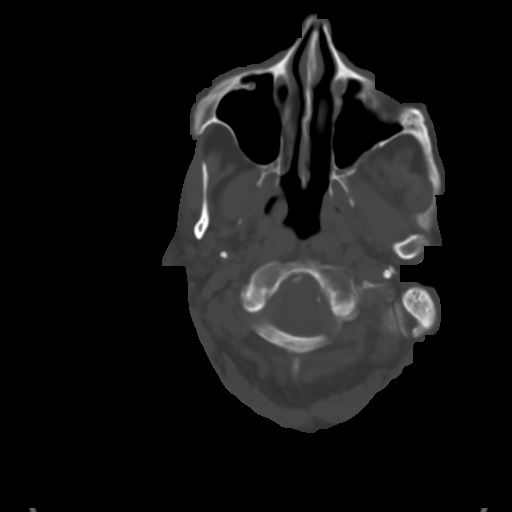
[im 6/34  brain]
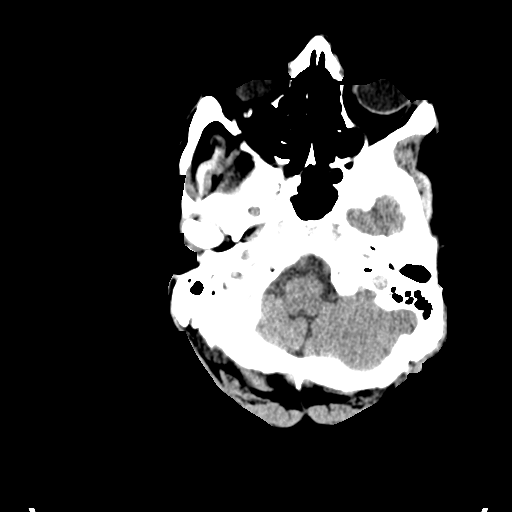
[im 10/34  brain]
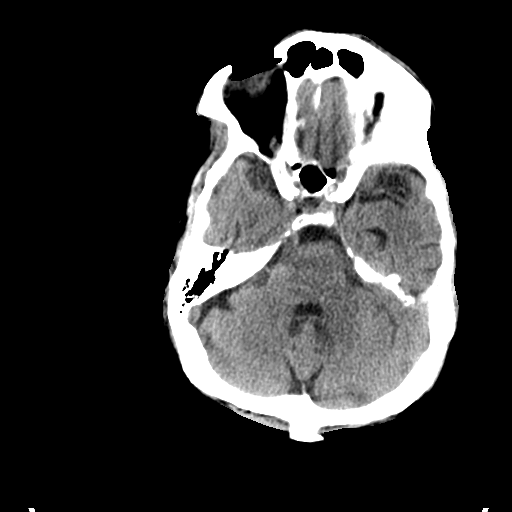
[im 12/34  brain]
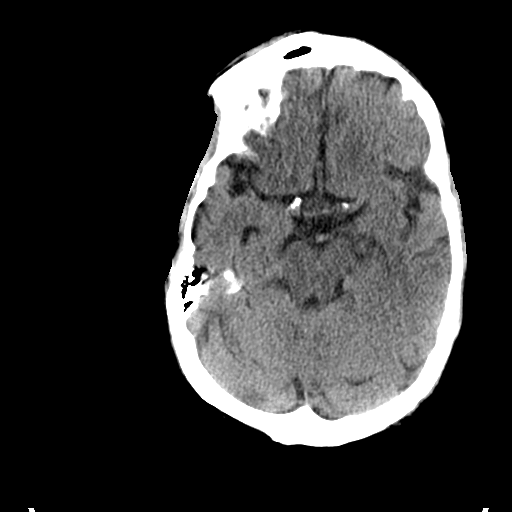
[im 15/34  brain]
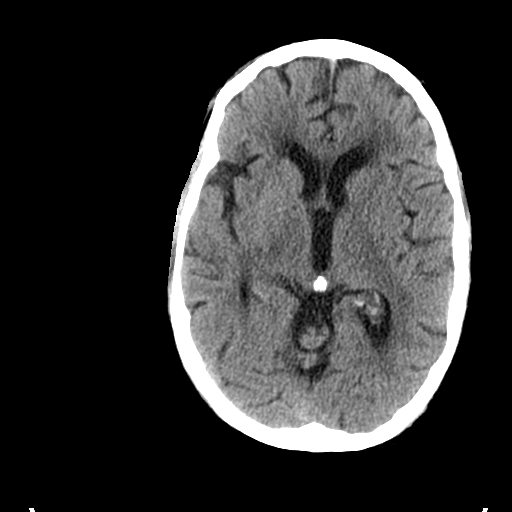
[im 15/34  bone]
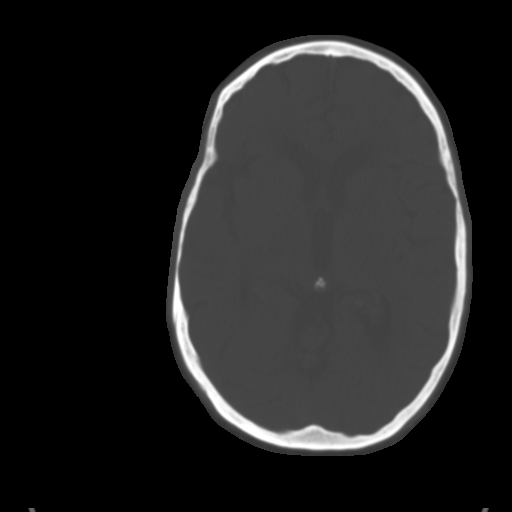
[im 19/34  brain]
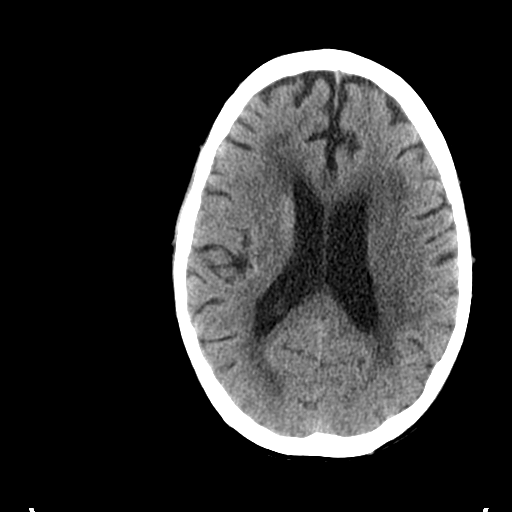
[im 22/34  brain]
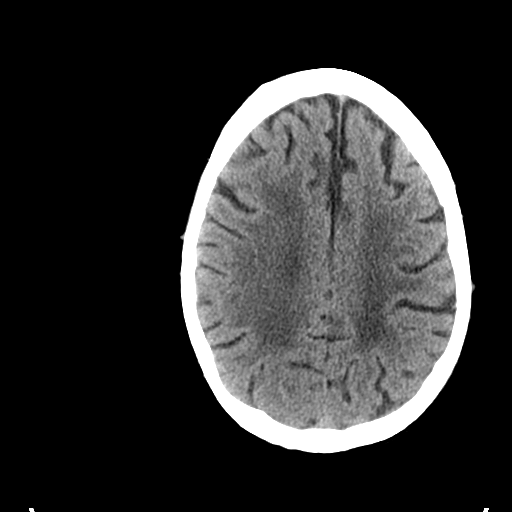
[im 26/34  brain]
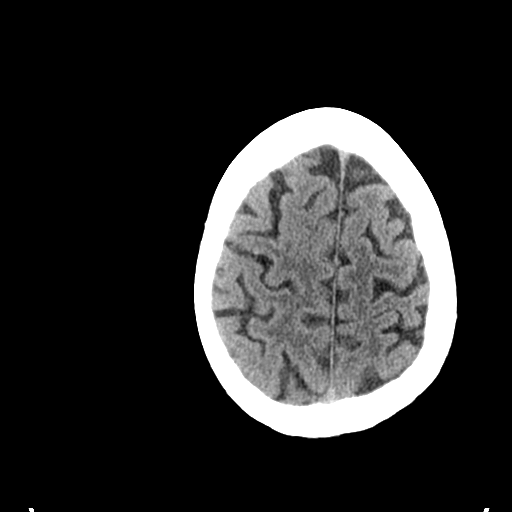
[im 28/34  brain]
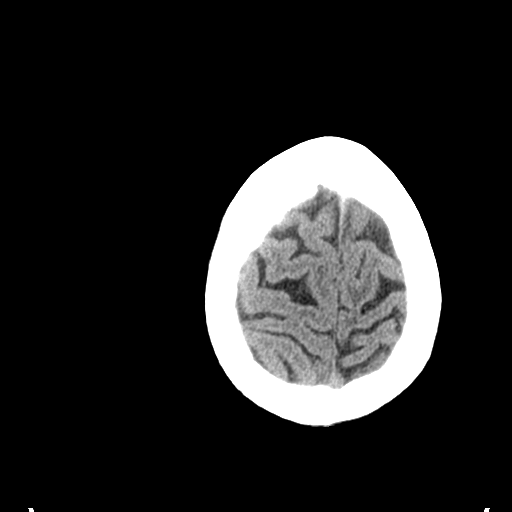
[im 28/34  bone]
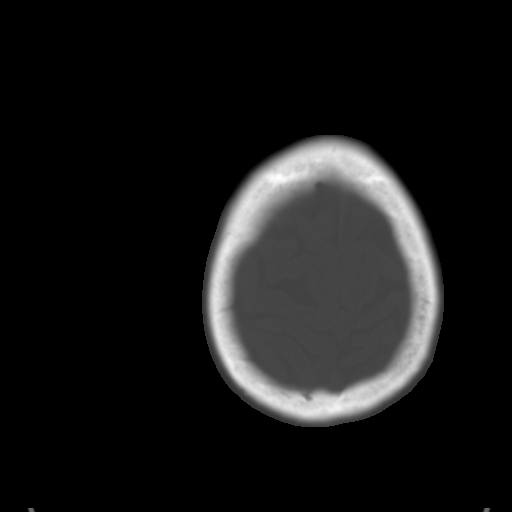
[im 31/34  brain]
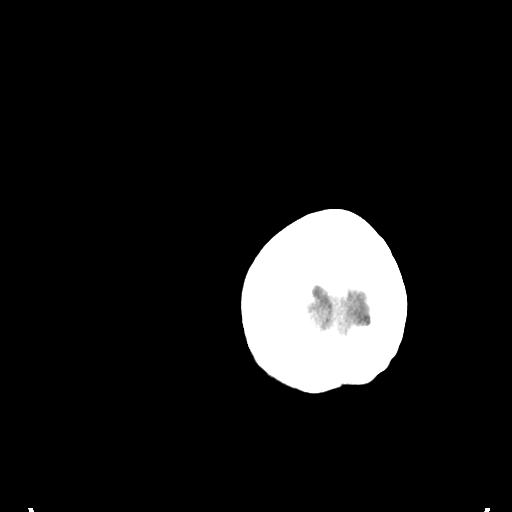

[Series 5: coronal soft tissue · coronal · 0.32mm/px · 3 of 76 slices shown]
[im 26/76  brain]
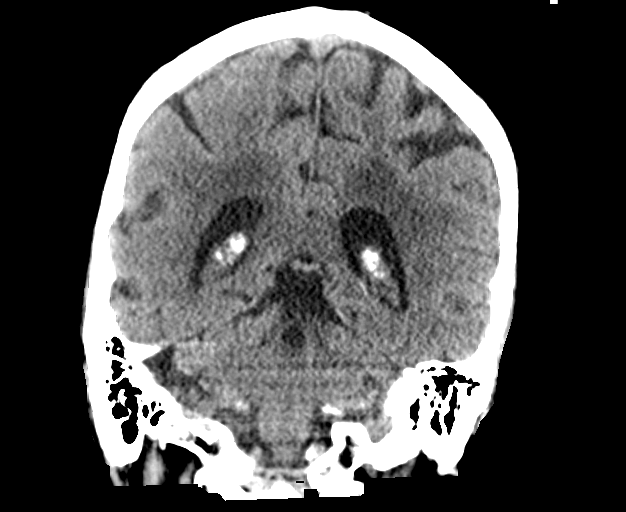
[im 34/76  brain]
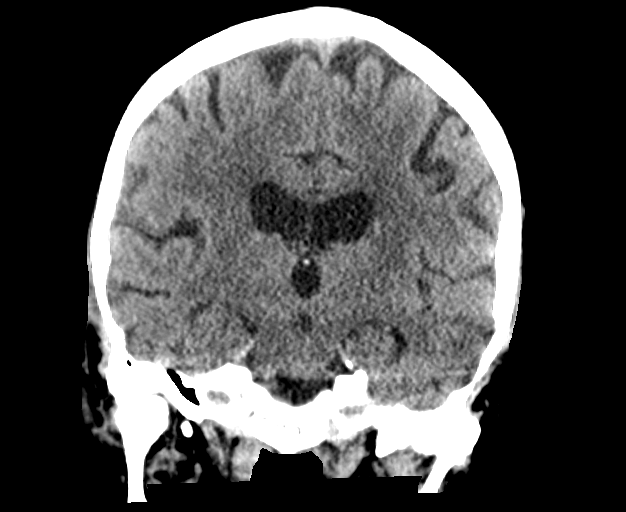
[im 42/76  brain]
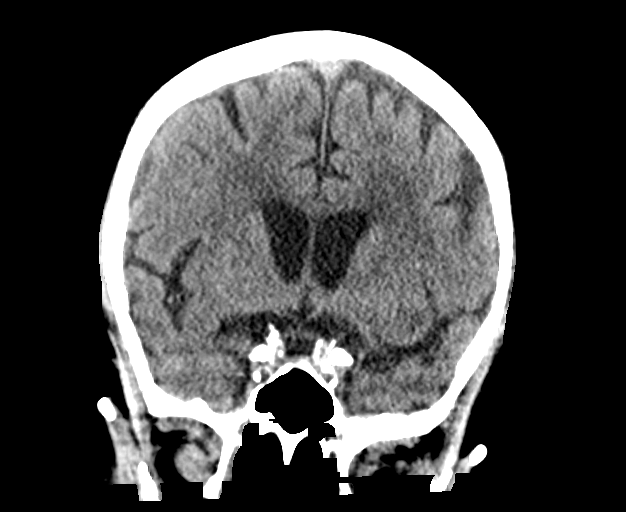

[Series 6: sagittal soft tissue · sagittal · 0.36mm/px · 3 of 56 slices shown]
[im 19/56  brain]
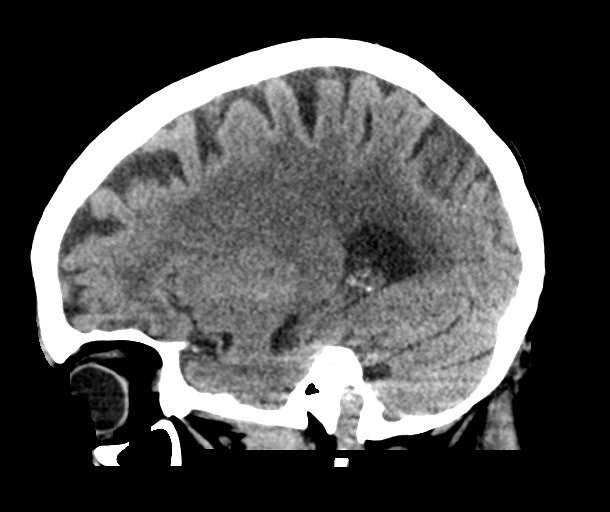
[im 28/56  brain]
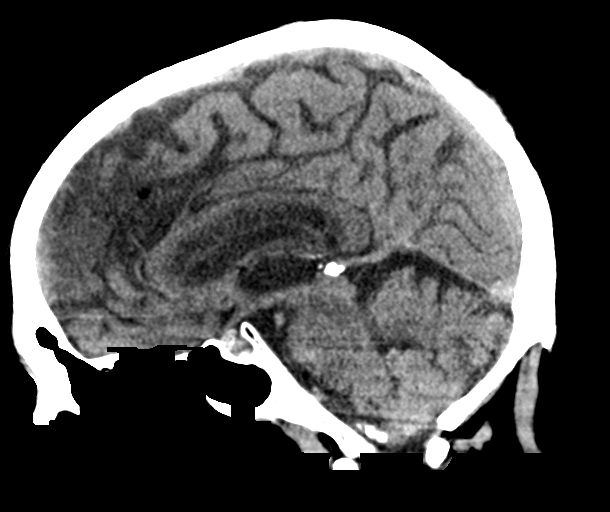
[im 37/56  brain]
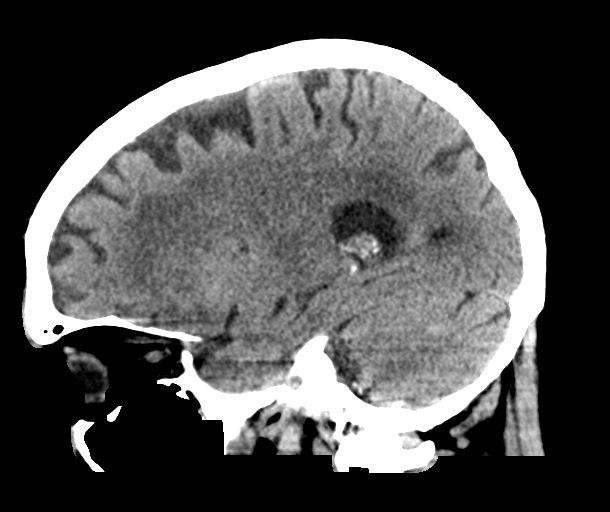

[16 of 47 positions shown; findings below may reference images not displayed]

FINDINGS: Brain: No acute infarct or hemorrhage. Lateral ventricles and
midline structures are stable. Chronic small vessel ischemic changes
are again seen throughout the periventricular and subcortical white
matter. No acute extra-axial fluid collections. No mass effect.

Vascular: Stable atherosclerosis.  No hyperdense vessel.

Skull: Normal. Negative for fracture or focal lesion.

Sinuses/Orbits: No acute finding.

Other: None.
IMPRESSION: 1. Stable head CT, no acute intracranial process.

## 2021-09-09 MED ORDER — SODIUM CHLORIDE 0.9% FLUSH
10.0000 mL | Freq: Once | INTRAVENOUS | Status: AC
  Administered 2021-09-09: 10 mL

## 2021-09-09 MED ORDER — SODIUM CHLORIDE 0.9 % IV SOLN: Freq: Once | INTRAVENOUS | Status: AC

## 2021-09-09 MED ORDER — SODIUM CHLORIDE 0.9 % IV SOLN
1500.0000 mg | Freq: Once | INTRAVENOUS | Status: AC
  Administered 2021-09-09: 1500 mg via INTRAVENOUS
  Filled 2021-09-09: qty 30

## 2021-09-09 MED ORDER — SODIUM CHLORIDE 0.9 % IV SOLN: INTRAVENOUS | Status: AC

## 2021-09-09 NOTE — Patient Instructions (Signed)
[facility] MEDICAL [facility]COLOG[facility]   Discharge Instructions: Thank you for choosing [facility] to provide your oncology and hematology care.   If you have a lab appointment with the Cancer Center, please go directly to the Cancer Center and check in at the registration area.   Wear comfortable clothing and clothing appropriate for easy access to any Portacath or PICC line.   We strive to give you quality time with your provider. You may need to reschedule your appointment if you arrive late (15 or more minutes).  Arriving late affects you and other patients whose appointments are after yours.  Also, if you miss three or more appointments without notifying the office, you may be dismissed from the clinic at the provider's discretion.      For prescription refill requests, have your pharmacy contact our office and allow 72 hours for refills to be completed.    Today you received the following chemotherapy and/or immunotherapy agents: durvalumab.      To help prevent nausea and vomiting after your treatment, we encourage you to take your nausea medication as directed.  BELOW ARE SYMPTOMS THAT SHOULD BE REPORTED IMMEDIATELY: *FEVER GREATER THAN 100.4 F (38 C) OR HIGHER *CHILLS OR SWEATING *NAUSEA AND VOMITING THAT IS NOT CONTROLLED WITH YOUR NAUSEA MEDICATION *UNUSUAL SHORTNESS OF BREATH *UNUSUAL BRUISING OR BLEEDING *URINARY PROBLEMS (pain or burning when urinating, or frequent urination) *BOWEL PROBLEMS (unusual diarrhea, constipation, pain near the anus) TENDERNESS IN MOUTH AND THROAT WITH OR WITHOUT PRESENCE OF ULCERS (sore throat, sores in mouth, or a toothache) UNUSUAL RASH, SWELLING OR PAIN  UNUSUAL VAGINAL DISCHARGE OR ITCHING   Items with * indicate a potential emergency and should be followed up as soon as possible or go to the Emergency Department if any problems should occur.  Please show the CHEMOTHERAPY ALERT CARD or IMMUNOTHERAPY ALERT CARD at check-in  to the Emergency Department and triage nurse.  Should you have questions after your visit or need to cancel or reschedule your appointment, please contact [facility]  Dept: [phone]  and follow the prompts.  Office hours are 8:00 a.m. to 4:30 p.m. Monday - Friday. Please note that voicemails left after 4:00 p.m. may not be returned until the following business day.  We are closed weekends and major holidays. You have access to a nurse at all times for urgent questions. Please call the main number to the clinic Dept: [phone] and follow the prompts.   For any non-urgent questions, you may also contact your provider using MyChart. We now offer e-Visits for anyone 18 and older to request care online for non-urgent symptoms. For details visit [email or web address].   Also download the MyChart app! Go to the app store, search "MyChart", open the app, select [facility], and log in with your MyChart username and password.  Due to Covid, a mask is required upon entering the hospital/clinic. If you do not have a mask, one will be given to you upon arrival. For doctor visits, patients may have 1 support person aged 18 or older with them. For treatment visits, patients cannot have anyone with them due to current Covid guidelines and our immunocompromised population.   

## 2021-09-09 NOTE — ED Triage Notes (Signed)
Pt arrived via EMS, from home, fell off of steps in garage. Hit head. Laceration to left hand and lip. No blood thinners no LOC.

## 2021-09-09 NOTE — ED Provider Notes (Signed)
Emergency Medicine Provider Triage Evaluation Note  AZLAN HANWAY , a 80 y.o. male  was evaluated in triage.  Pt complains of mechanical fall PTA with laceration of left hand. Patient reports he hit his head on garage door on the way down. Denies LOC, denies pain at this time. Patient not on blood thinners. Small cell carcinoma patient received immunotherapy earlier today. Wife reports seizure-like activity after fall per nurse. He denies this, denies confusion.  Review of Systems  Positive: Fall, laceration Negative: Blood thinners, loss of consciousness  Physical Exam  BP 95/63 (BP Location: Left Arm)    Pulse 62    Temp 98.2 F (36.8 C) (Oral)    Resp 18    SpO2 98%  Gen:   Awake, no distress   Resp:  Normal effort  MSK:   Moves extremities without difficulty  Other:  CN3-12 grossly intact, no ttp throughout, laceration / skin tear dorsal right hand  Medical Decision Making  Medically screening exam initiated at 4:12 PM.  Appropriate orders placed.  ABENEZER ODONELL was informed that the remainder of the evaluation will be completed by another provider, this initial triage assessment does not replace that evaluation, and the importance of remaining in the ED until their evaluation is complete.  Fall, questionable seizure like activity   Dorien Chihuahua 09/09/21 1617    Milton Ferguson, MD 09/10/21 [phone]

## 2021-09-09 NOTE — Telephone Encounter (Signed)
Wife stated pt in ED after a fall and hitting his head. She expressed a need for some assistance at home.  She cannot catch him or pick him up if he falls. I instructed her to notify his PCP with updated information.

## 2021-09-09 NOTE — Progress Notes (Signed)
Nutrition Follow-up:  Patient receiving Imfinzi for metastatic SCLC. Noted patient seen in emergency department 12/2 for weakness.   Met with patient during infusion. He reports feeling fatigued and is weak. He is asking if and how he can improve his strength. Patient reports appetite has improved some since starting appetite stimulant (Megace). Yesterday patient had chicken breast with fettuccini for lunch, large plate of angel hair pasta with meat sauce and cheese for dinner. He had 2 pieces of bacon, bowl of rice chex with whole milk and 2 scoops beneprotein (50 kcal, 12g protein), fresh fruit, piece of cinnamon toast with butter this morning for breakfast. Patient reports drinking CIB and milkshakes, but not regularly. Patient reports drinking more water, he is adding liquid IV which has been helping improve intake.    Medications: Megace started 12/3  Labs: BUN 42, Cr 1.58  Anthropometrics: Weight 145 lb 14.4 oz today decreased 8% in one month; significant   11/15 - 158 lb 3.2 oz 10/31 - 156 lb 8 oz 10/19 - 159 lb  9/20 - 166 lb 12.8 oz    NUTRITION DIAGNOSIS: Food and nutrition related knowledge deficit ongoing   INTERVENTION:  Encouraged high calorie, high protein foods to promote weight gain Patient will try snacks in between meals as well as eat bedtime snack for added calories and protein  Continue adding Beneprotein powder to foods (one scoop provided 25 kcal, 6 grams protein) Encouraged drinking CIB mixed with whole milk 2x/day Continue taking appetite stimulant as prescribed Patient reports interest in receiving PT services to improve strength - will send message to PA-C Patient has contact information    MONITORING, EVALUATION, GOAL: weight trends, intake   NEXT VISIT: Wednesday January 11 during infusion

## 2021-09-09 NOTE — ED Notes (Signed)
Told registration they were leaving.

## 2021-09-09 NOTE — Progress Notes (Signed)
[place] [phone]   [phone]  OFFICE PROGRESS NOTE  Gregory Pepper, MD [address] [place] [address]  DIAGNOSIS: extensive stage (T2 a, N2, M1c) small cell lung cancer presented with right hilar mass in addition to mediastinal lymphadenopathy as well as metastatic disease to the bone with complete replacement of the L5 status post laminectomy with resection of epidural tumor as well as metastatic disease to the brain, bones and bilateral adrenal glands diagnosed in January 2022.   PRIOR THERAPY:  1) Status post palliative radiotherapy to the resected area at L5 under the care of Dr. Lisbeth Renshaw. 2) whole brain irradiation under the care of Dr. Lisbeth Renshaw. 3) palliative radiotherapy to the enlarging bilateral adrenal gland metastasis under the care of Dr. Lisbeth Renshaw last fraction June 20, 2021.  CURRENT THERAPY: Systemic chemotherapy with carboplatin for AUC of 5 from day 1, etoposide 100 mg/M2 on days 1, 2 and 3 with Cosela 240 mg/M2 before chemotherapy in addition to Imfinzi 1500 mg IV every 3 weeks with day one of the chemotherapy.  First dose December 24, 2020.  Status post 10  cycles.  Starting from cycle #5 the patient will be on maintenance treatment with Imfinzi every 4 weeks.  INTERVAL HISTORY: Gregory Hodges 80 y.o. male returns to the clinic today for follow-up visit.  The patient continues to complain of increasing fatigue and weakness as well as dizzy spells and imbalance.  He completed a course of palliative radiotherapy to the brain under the care of Dr. Lisbeth Renshaw in addition to palliative radiation to the bilateral adrenal glands.  He is currently on Megace for appetite with no significant improvement in his weight.  He denied having any current chest pain, shortness of breath, cough or hemoptysis.  He denied having any fever or chills.  His blood pressure is low today.  He is here for evaluation before starting cycle #11 of  his treatment with Imfinzi.   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  MEDICATIONS:  Current Outpatient Medications  Medication Sig Dispense Refill   acetaminophen (TYLENOL) 500 MG tablet Take 500 mg by mouth every 6 (six) hours as needed for moderate pain.     aspirin 81 MG tablet Take 81 mg by mouth daily.     atenolol (TENORMIN) 25 MG tablet Take 25 mg by mouth 2 (two) times daily.     calcium carbonate (TUMS - DOSED IN MG ELEMENTAL CALCIUM) 500 MG chewable tablet Chew 2 tablets by mouth daily as needed for indigestion or heartburn.     diphenhydrAMINE (BENADRYL) 25 MG tablet Take 50 mg by mouth daily as needed for allergies.     ferrous sulfate 325 (  65 FE) MG tablet Take 325 mg by mouth every Monday, Wednesday, and Friday.     lidocaine-prilocaine (EMLA) cream Apply to the Port-A-Cath site 30-60 minutes before treatment 30 g 0   megestrol (MEGACE ES) 625 MG/5ML suspension Take 5 mLs (625 mg total) by mouth daily. 150 mL 0   nitroGLYCERIN (NITROSTAT) 0.4 MG SL tablet Place 0.4 mg under the tongue every 5 (five) minutes as needed for chest pain.     omeprazole (PRILOSEC) 10 MG capsule Take 10 mg by mouth daily.     ondansetron (ZOFRAN) 8 MG tablet Take 1 tablet (8  mg total) by mouth every 8 (eight) hours as needed for nausea or vomiting. 20 tablet 0   oxymetazoline (AFRIN) 0.05 % nasal spray Place 1 spray into both nostrils 2 (two) times daily as needed for congestion.     prochlorperazine (COMPAZINE) 10 MG tablet Take 1 tablet (10 mg total) by mouth every 6 (six) hours as needed for nausea or vomiting. 30 tablet 0   rosuvastatin (CRESTOR) 10 MG tablet Take 10 mg by mouth daily.     No current facility-administered medications for this visit.    SURGICAL HISTORY:  Past Surgical History:  Procedure Laterality Date   ABDOMINAL AORTIC ANEURYSM REPAIR  03/03/2010   AXILLARY-FEMORAL BYPASS GRAFT  07/22/2010   bilateral   bowel     herniated bowel   CARDIAC CATHETERIZATION     CORONARY STENT PLACEMENT     CYSTOSCOPY     IR IMAGING GUIDED PORT INSERTION  12/27/2020   LAMINECTOMY N/A 10/22/2020   Procedure: Lumbar five Open Laminectomy for tumor resection;  Surgeon: Judith Part, MD;  Location: [facility];  Service: Neurosurgery;  Laterality: N/A;   MOHS SURGERY     several     REVIEW OF SYSTEMS:  A comprehensive review of systems was negative except for: Constitutional: positive for anorexia, fatigue, and weight loss Musculoskeletal: positive for muscle weakness Neurological: positive for dizziness   PHYSICAL EXAMINATION: General appearance: alert, cooperative, fatigued, and no distress Head: Normocephalic, without obvious abnormality, atraumatic Neck: no adenopathy, no JVD, supple, symmetrical, trachea midline, and thyroid not enlarged, symmetric, no tenderness/mass/nodules Lymph nodes: Cervical, supraclavicular, and axillary nodes normal. Resp: clear to auscultation bilaterally Back: symmetric, no curvature. ROM normal. No CVA tenderness. Cardio: regular rate and rhythm, S1, S2 normal, no murmur, click, rub or gallop GI: soft, non-tender; bowel sounds normal; no masses,  no organomegaly Extremities: extremities normal, atraumatic, no cyanosis  or edema  ECOG PERFORMANCE STATUS: 1 - Symptomatic but completely ambulatory  Blood pressure (!) 87/53, pulse 64, temperature 97.6 F (36.4 C), temperature source Tympanic, weight 145 lb 14.4 oz (66.2 kg), SpO2 97 %.  LABORATORY DATA: Lab Results  Component Value Date   WBC 4.1 09/09/2021   HGB 11.9 (L) 09/09/2021   HCT 35.6 (L) 09/09/2021   MCV 93.4 09/09/2021   PLT 69 (L) 09/09/2021      Chemistry      Component Value Date/Time   NA 140 09/09/2021 1127   K 4.9 09/09/2021 1127   CL 115 (H) 09/09/2021 1127   CO2 17 (L) 09/09/2021 1127   BUN 42 (H) 09/09/2021 1127   CREATININE 1.58 (H) 09/09/2021 1127      Component Value Date/Time   CALCIUM 8.7 (L) 09/09/2021 1127   ALKPHOS 53 09/09/2021 1127   AST 9 (L) 09/09/2021 1127   ALT 6 09/09/2021 1127   BILITOT 0.6 09/09/2021 1127  RADIOGRAPHIC STUDIES: CT Head Wo Contrast  Result Date: 08/29/2021 CLINICAL DATA:  Mental status change. Leg weakness for 2 days. History of small-cell lung cancer. EXAM: CT HEAD WITHOUT CONTRAST TECHNIQUE: Contiguous axial images were obtained from the base of the skull through the vertex without intravenous contrast. COMPARISON:  MR head 05/26/2021 FINDINGS: Brain: No evidence of acute infarction, hemorrhage, hydrocephalus, extra-axial collection or mass lesion/mass effect. Hypodensities in the periventricular and subcortical white matter, consistent with chronic small-vessel ischemic changes. Vascular: No hyperdense vessel or unexpected calcification. Skull: Normal. Negative for fracture or focal lesion. Sinuses/Orbits: No acute finding. Other: None. IMPRESSION: No acute intracranial abnormality. Electronically Signed   By: Ileana Roup M.D.   On: 08/29/2021 10:56   CT Chest W Contrast  Result Date: 08/11/2021 CLINICAL DATA:  Small-cell lung cancer.  Restaging. EXAM: CT CHEST, ABDOMEN, AND PELVIS WITH CONTRAST TECHNIQUE: Multidetector CT imaging of the chest, abdomen and pelvis was performed  following the standard protocol during bolus administration of intravenous contrast. CONTRAST:  72m OMNIPAQUE IOHEXOL 350 MG/ML SOLN COMPARISON:  05/26/2021 FINDINGS: CT CHEST FINDINGS Cardiovascular: The heart size is normal. No substantial pericardial effusion. Coronary artery calcification is evident. Moderate atherosclerotic calcification is noted in the wall of the thoracic aorta. Right Port-A-Cath tip is positioned in the distal SVC. Extra anatomic bypass grafts noted in the thoracic sidewall bilaterally. Mediastinum/Nodes: No mediastinal lymphadenopathy. There is no hilar lymphadenopathy. The esophagus has normal imaging features. There is no axillary lymphadenopathy. Lungs/Pleura: Centrilobular and paraseptal emphysema evident. 3 mm right upper lobe nodule on 64/4 is new in the interval no other new or suspicious pulmonary nodule/mass. No focal airspace consolidation. No pleural effusion. Musculoskeletal: Similar appearance of bilateral sclerotic rib lesions including posterior right seventh rib with associated fracture nonunion and sclerotic anterior left fourth rib. CT ABDOMEN PELVIS FINDINGS Hepatobiliary: Scattered tiny hypodensities in the liver measure up to 9 mm, too small to characterize but unchanged in the interval. Gallbladder is distended. Intra and extrahepatic biliary duct dilatation again noted with common bile duct measuring 13 mm in the head of the pancreas. Pancreas: No focal mass lesion. No dilatation of the main duct. No intraparenchymal cyst. No peripancreatic edema. Spleen: No splenomegaly. No focal mass lesion. Adrenals/Urinary Tract: Interval decrease in both adrenal nodules with now only some minimal adrenal thickening evident. Bilateral nonobstructing renal stones again identified with bilateral renal cysts evident. Scattered tiny hypoattenuating lesions in both kidneys are too small to characterize but likely benign. No evidence for hydroureter. The urinary bladder appears normal  for the degree of distention. Stomach/Bowel: Large hiatal hernia. Duodenum is normally positioned as is the ligament of Treitz. No small bowel wall thickening. No small bowel dilatation. The terminal ileum is normal. The appendix is normal. No gross colonic mass. No colonic wall thickening. Diverticular changes are noted in the left colon without evidence of diverticulitis. Vascular/Lymphatic: Chronic infrarenal aortic occlusion. There is no gastrohepatic or hepatoduodenal ligament lymphadenopathy. No retroperitoneal or mesenteric lymphadenopathy. No pelvic sidewall lymphadenopathy. Reproductive: The prostate gland and seminal vesicles are unremarkable. Other: No intraperitoneal free fluid. Musculoskeletal: Stable sclerotic lesion involving the right acetabular and L5 vertebral body. IMPRESSION: 1. 3 mm right upper lobe pulmonary nodule is new in the interval. 1 nonspecific, close attention on follow-up recommended. 2. Bilateral adrenal nodules seen previously have decreased substantially in the interval with now only some minimal adrenal thickening evident bilaterally. 3. Stable appearance of scattered tiny hypoattenuating liver lesions. 4. Large hiatal hernia. 5. Intra and extrahepatic biliary duct  dilatation, similar to prior. 6. Bilateral nonobstructing renal stones with bilateral renal cysts. 7. Chronic infrarenal aortic occlusion. 8. Aortic Atherosclerosis (ICD10-I70.0) and Emphysema (ICD10-J43.9). Electronically Signed   By: Misty Stanley M.D.   On: 08/11/2021 11:29   CT Abdomen Pelvis W Contrast  Result Date: 08/11/2021 CLINICAL DATA:  Small-cell lung cancer.  Restaging. EXAM: CT CHEST, ABDOMEN, AND PELVIS WITH CONTRAST TECHNIQUE: Multidetector CT imaging of the chest, abdomen and pelvis was performed following the standard protocol during bolus administration of intravenous contrast. CONTRAST:  4m OMNIPAQUE IOHEXOL 350 MG/ML SOLN COMPARISON:  05/26/2021 FINDINGS: CT CHEST FINDINGS Cardiovascular: The  heart size is normal. No substantial pericardial effusion. Coronary artery calcification is evident. Moderate atherosclerotic calcification is noted in the wall of the thoracic aorta. Right Port-A-Cath tip is positioned in the distal SVC. Extra anatomic bypass grafts noted in the thoracic sidewall bilaterally. Mediastinum/Nodes: No mediastinal lymphadenopathy. There is no hilar lymphadenopathy. The esophagus has normal imaging features. There is no axillary lymphadenopathy. Lungs/Pleura: Centrilobular and paraseptal emphysema evident. 3 mm right upper lobe nodule on 64/4 is new in the interval no other new or suspicious pulmonary nodule/mass. No focal airspace consolidation. No pleural effusion. Musculoskeletal: Similar appearance of bilateral sclerotic rib lesions including posterior right seventh rib with associated fracture nonunion and sclerotic anterior left fourth rib. CT ABDOMEN PELVIS FINDINGS Hepatobiliary: Scattered tiny hypodensities in the liver measure up to 9 mm, too small to characterize but unchanged in the interval. Gallbladder is distended. Intra and extrahepatic biliary duct dilatation again noted with common bile duct measuring 13 mm in the head of the pancreas. Pancreas: No focal mass lesion. No dilatation of the main duct. No intraparenchymal cyst. No peripancreatic edema. Spleen: No splenomegaly. No focal mass lesion. Adrenals/Urinary Tract: Interval decrease in both adrenal nodules with now only some minimal adrenal thickening evident. Bilateral nonobstructing renal stones again identified with bilateral renal cysts evident. Scattered tiny hypoattenuating lesions in both kidneys are too small to characterize but likely benign. No evidence for hydroureter. The urinary bladder appears normal for the degree of distention. Stomach/Bowel: Large hiatal hernia. Duodenum is normally positioned as is the ligament of Treitz. No small bowel wall thickening. No small bowel dilatation. The terminal ileum  is normal. The appendix is normal. No gross colonic mass. No colonic wall thickening. Diverticular changes are noted in the left colon without evidence of diverticulitis. Vascular/Lymphatic: Chronic infrarenal aortic occlusion. There is no gastrohepatic or hepatoduodenal ligament lymphadenopathy. No retroperitoneal or mesenteric lymphadenopathy. No pelvic sidewall lymphadenopathy. Reproductive: The prostate gland and seminal vesicles are unremarkable. Other: No intraperitoneal free fluid. Musculoskeletal: Stable sclerotic lesion involving the right acetabular and L5 vertebral body. IMPRESSION: 1. 3 mm right upper lobe pulmonary nodule is new in the interval. 1 nonspecific, close attention on follow-up recommended. 2. Bilateral adrenal nodules seen previously have decreased substantially in the interval with now only some minimal adrenal thickening evident bilaterally. 3. Stable appearance of scattered tiny hypoattenuating liver lesions. 4. Large hiatal hernia. 5. Intra and extrahepatic biliary duct dilatation, similar to prior. 6. Bilateral nonobstructing renal stones with bilateral renal cysts. 7. Chronic infrarenal aortic occlusion. 8. Aortic Atherosclerosis (ICD10-I70.0) and Emphysema (ICD10-J43.9). Electronically Signed   By: EMisty StanleyM.D.   On: 08/11/2021 11:29   DG Chest Portable 1 View  Result Date: 08/29/2021 CLINICAL DATA:  Altered mental status. EXAM: PORTABLE CHEST 1 VIEW COMPARISON:  Chest radiograph 01/23/2021; CT chest 08/11/2021 FINDINGS: Right IJ power injectable port central venous catheter tip projects at the level  of the mid SVC. The heart size and mediastinal contours are within normal limits. Aortic calcifications. Both lungs are clear. Hiatal hernia. Sclerotic lesion an old fracture of the posterior right seventh rib, better appreciated on recent cross-sectional imaging. No acute osseous abnormality. Surgical clips overlie the upper chest wall bilaterally. IMPRESSION: 1. No acute  cardiopulmonary abnormality. 2. Sclerotic lesion and healing fracture of the posterior right seventh rib, better appreciated on recent cross-sectional imaging. 3. Aortic Atherosclerosis (ICD10-I70.0). 4. Hiatal hernia, stable. Electronically Signed   By: Ileana Roup M.D.   On: 08/29/2021 09:51     ASSESSMENT AND PLAN: This is a very pleasant 80  years old white male recently diagnosed with extensive stage (T2 a, N2, M1 C) small cell lung cancer presented with right upper lobe lesion with extensive mediastinal and right hilar adenopathy in addition to extensive bone metastasis as well as metastatic disease to the adrenal gland bilaterally and multiple brain lesions diagnosed in February 2022 status post resection of the epidural tumor at the L5 as well as palliative radiotherapy to the lesion after resection. The patient is currently undergoing systemic chemotherapy with carboplatin for AUC of 5 on day 1, etoposide 100 Mg/M2 on days 1, 2 and 3 with Cosela on the days of the chemotherapy as well as Imfinzi every 3 weeks.  Status post 10 cycles.  Starting from cycle #5 the patient is on maintenance treatment with Imfinzi 1500 Mg IV every 4 weeks. He completed whole brain irradiation under the care of Dr. Lisbeth Renshaw. The patient has been tolerating his treatment with Imfinzi fairly well with no concerning complaints except for fatigue, lack of appetite and weight loss that could be secondary to his disease. I recommended for him to proceed with cycle #11 today as planned. I will repeat CT scan of the chest, abdomen and pelvis for restaging of his disease before he comes back for cycle #12 to rule out any disease progression. For the hypertension and dehydration, I will arrange for the patient to receive 1 L of normal saline in the clinic today. The patient was advised to call immediately if he has any concerning symptoms in the interval.  All questions were answered. The patient knows to call the clinic with any  problems, questions or concerns. We can certainly see the patient much sooner if necessary.   Disclaimer: This note was dictated with voice recognition software. Similar sounding words can inadvertently be transcribed and may not be corrected upon review.

## 2021-09-09 NOTE — Progress Notes (Signed)
Ok to treat today per Provider with elevated Scr and low plt count. BP is low today also giving additional fluids.

## 2021-09-10 LAB — URINE CULTURE: Culture: NO GROWTH

## 2021-09-11 ENCOUNTER — Other Ambulatory Visit: Payer: Self-pay | Admitting: Radiation Therapy

## 2021-09-11 DIAGNOSIS — C7931 Secondary malignant neoplasm of brain: Secondary | ICD-10-CM

## 2021-09-11 NOTE — Progress Notes (Signed)
Orders placed for port access the day of brain MRI.   Mont Dutton R.T.(R)(T) Radiation Special Procedures Navigator

## 2021-09-12 ENCOUNTER — Inpatient Hospital Stay (HOSPITAL_BASED_OUTPATIENT_CLINIC_OR_DEPARTMENT_OTHER): Payer: PPO | Admitting: Nurse Practitioner

## 2021-09-12 ENCOUNTER — Telehealth: Payer: Self-pay

## 2021-09-12 ENCOUNTER — Telehealth: Payer: Self-pay | Admitting: Medical Oncology

## 2021-09-12 DIAGNOSIS — I714 Abdominal aortic aneurysm, without rupture, unspecified: Secondary | ICD-10-CM | POA: Diagnosis not present

## 2021-09-12 DIAGNOSIS — M25552 Pain in left hip: Secondary | ICD-10-CM | POA: Diagnosis not present

## 2021-09-12 DIAGNOSIS — W1830XA Fall on same level, unspecified, initial encounter: Secondary | ICD-10-CM | POA: Diagnosis present

## 2021-09-12 DIAGNOSIS — R079 Chest pain, unspecified: Secondary | ICD-10-CM | POA: Diagnosis not present

## 2021-09-12 DIAGNOSIS — N19 Unspecified kidney failure: Secondary | ICD-10-CM | POA: Diagnosis not present

## 2021-09-12 DIAGNOSIS — E7800 Pure hypercholesterolemia, unspecified: Secondary | ICD-10-CM | POA: Diagnosis not present

## 2021-09-12 DIAGNOSIS — I739 Peripheral vascular disease, unspecified: Secondary | ICD-10-CM | POA: Diagnosis not present

## 2021-09-12 DIAGNOSIS — I951 Orthostatic hypotension: Secondary | ICD-10-CM | POA: Diagnosis not present

## 2021-09-12 DIAGNOSIS — K449 Diaphragmatic hernia without obstruction or gangrene: Secondary | ICD-10-CM | POA: Diagnosis not present

## 2021-09-12 DIAGNOSIS — Z515 Encounter for palliative care: Secondary | ICD-10-CM | POA: Diagnosis not present

## 2021-09-12 DIAGNOSIS — S50812A Abrasion of left forearm, initial encounter: Secondary | ICD-10-CM | POA: Diagnosis not present

## 2021-09-12 DIAGNOSIS — R338 Other retention of urine: Secondary | ICD-10-CM | POA: Diagnosis not present

## 2021-09-12 DIAGNOSIS — C7972 Secondary malignant neoplasm of left adrenal gland: Secondary | ICD-10-CM | POA: Diagnosis not present

## 2021-09-12 DIAGNOSIS — C7931 Secondary malignant neoplasm of brain: Secondary | ICD-10-CM | POA: Diagnosis not present

## 2021-09-12 DIAGNOSIS — Z7901 Long term (current) use of anticoagulants: Secondary | ICD-10-CM | POA: Diagnosis not present

## 2021-09-12 DIAGNOSIS — R339 Retention of urine, unspecified: Secondary | ICD-10-CM | POA: Diagnosis not present

## 2021-09-12 DIAGNOSIS — E785 Hyperlipidemia, unspecified: Secondary | ICD-10-CM | POA: Diagnosis not present

## 2021-09-12 DIAGNOSIS — R7989 Other specified abnormal findings of blood chemistry: Secondary | ICD-10-CM | POA: Diagnosis not present

## 2021-09-12 DIAGNOSIS — Z20822 Contact with and (suspected) exposure to covid-19: Secondary | ICD-10-CM | POA: Diagnosis not present

## 2021-09-12 DIAGNOSIS — R530 Neoplastic (malignant) related fatigue: Secondary | ICD-10-CM | POA: Diagnosis not present

## 2021-09-12 DIAGNOSIS — Z7189 Other specified counseling: Secondary | ICD-10-CM | POA: Diagnosis not present

## 2021-09-12 DIAGNOSIS — K5900 Constipation, unspecified: Secondary | ICD-10-CM | POA: Diagnosis not present

## 2021-09-12 DIAGNOSIS — R52 Pain, unspecified: Secondary | ICD-10-CM | POA: Diagnosis not present

## 2021-09-12 DIAGNOSIS — N179 Acute kidney failure, unspecified: Secondary | ICD-10-CM | POA: Diagnosis not present

## 2021-09-12 DIAGNOSIS — W19XXXA Unspecified fall, initial encounter: Secondary | ICD-10-CM | POA: Diagnosis not present

## 2021-09-12 DIAGNOSIS — D61818 Other pancytopenia: Secondary | ICD-10-CM | POA: Diagnosis not present

## 2021-09-12 DIAGNOSIS — I10 Essential (primary) hypertension: Secondary | ICD-10-CM | POA: Diagnosis not present

## 2021-09-12 DIAGNOSIS — E441 Mild protein-calorie malnutrition: Secondary | ICD-10-CM | POA: Diagnosis not present

## 2021-09-12 DIAGNOSIS — R531 Weakness: Secondary | ICD-10-CM | POA: Diagnosis not present

## 2021-09-12 DIAGNOSIS — E860 Dehydration: Secondary | ICD-10-CM | POA: Diagnosis not present

## 2021-09-12 DIAGNOSIS — R102 Pelvic and perineal pain: Secondary | ICD-10-CM | POA: Diagnosis not present

## 2021-09-12 DIAGNOSIS — E43 Unspecified severe protein-calorie malnutrition: Secondary | ICD-10-CM | POA: Diagnosis not present

## 2021-09-12 DIAGNOSIS — K5090 Crohn's disease, unspecified, without complications: Secondary | ICD-10-CM | POA: Diagnosis not present

## 2021-09-12 DIAGNOSIS — R5383 Other fatigue: Secondary | ICD-10-CM | POA: Diagnosis not present

## 2021-09-12 DIAGNOSIS — E872 Acidosis, unspecified: Secondary | ICD-10-CM | POA: Diagnosis not present

## 2021-09-12 DIAGNOSIS — I959 Hypotension, unspecified: Secondary | ICD-10-CM | POA: Diagnosis not present

## 2021-09-12 DIAGNOSIS — F17210 Nicotine dependence, cigarettes, uncomplicated: Secondary | ICD-10-CM | POA: Diagnosis not present

## 2021-09-12 DIAGNOSIS — Z7401 Bed confinement status: Secondary | ICD-10-CM | POA: Diagnosis not present

## 2021-09-12 DIAGNOSIS — R296 Repeated falls: Secondary | ICD-10-CM | POA: Diagnosis not present

## 2021-09-12 DIAGNOSIS — C3491 Malignant neoplasm of unspecified part of right bronchus or lung: Secondary | ICD-10-CM | POA: Diagnosis not present

## 2021-09-12 DIAGNOSIS — D849 Immunodeficiency, unspecified: Secondary | ICD-10-CM | POA: Diagnosis not present

## 2021-09-12 DIAGNOSIS — C7971 Secondary malignant neoplasm of right adrenal gland: Secondary | ICD-10-CM | POA: Diagnosis not present

## 2021-09-12 DIAGNOSIS — Z9181 History of falling: Secondary | ICD-10-CM | POA: Diagnosis not present

## 2021-09-12 DIAGNOSIS — E46 Unspecified protein-calorie malnutrition: Secondary | ICD-10-CM | POA: Diagnosis not present

## 2021-09-12 DIAGNOSIS — Z66 Do not resuscitate: Secondary | ICD-10-CM | POA: Diagnosis not present

## 2021-09-12 DIAGNOSIS — Y92009 Unspecified place in unspecified non-institutional (private) residence as the place of occurrence of the external cause: Secondary | ICD-10-CM | POA: Diagnosis not present

## 2021-09-12 DIAGNOSIS — K219 Gastro-esophageal reflux disease without esophagitis: Secondary | ICD-10-CM | POA: Diagnosis not present

## 2021-09-12 DIAGNOSIS — C7951 Secondary malignant neoplasm of bone: Secondary | ICD-10-CM | POA: Diagnosis not present

## 2021-09-12 DIAGNOSIS — M6281 Muscle weakness (generalized): Secondary | ICD-10-CM | POA: Diagnosis not present

## 2021-09-12 DIAGNOSIS — G319 Degenerative disease of nervous system, unspecified: Secondary | ICD-10-CM | POA: Diagnosis not present

## 2021-09-12 DIAGNOSIS — R110 Nausea: Secondary | ICD-10-CM | POA: Diagnosis not present

## 2021-09-12 DIAGNOSIS — G9389 Other specified disorders of brain: Secondary | ICD-10-CM | POA: Diagnosis not present

## 2021-09-12 DIAGNOSIS — Z043 Encounter for examination and observation following other accident: Secondary | ICD-10-CM | POA: Diagnosis not present

## 2021-09-12 DIAGNOSIS — E2740 Unspecified adrenocortical insufficiency: Secondary | ICD-10-CM | POA: Diagnosis not present

## 2021-09-12 DIAGNOSIS — M2550 Pain in unspecified joint: Secondary | ICD-10-CM | POA: Diagnosis not present

## 2021-09-12 NOTE — Telephone Encounter (Signed)
[facility] PT referral sent to [facility] for evaluation. Pending response at this time.

## 2021-09-12 NOTE — Progress Notes (Signed)
[place]  [phone]) [phone] Fax:(336) [phone]   Name: Gregory Hodges Date: 09/12/2021 MRN: [id number]  DOB: 31-Dec-1940  Patient Care Team: [name], MD as PCP - General (Family Medicine)   I connected with Gregory Hodges on 09/12/21 at  1:00 PM EST by phone and verified that I am speaking with the correct person using two identifiers.   I discussed the limitations, risks, security and privacy concerns of performing an evaluation and management service by telemedicine and the availability of in-person appointments. I also discussed with the patient that there may be a patient responsible charge related to this service. The patient expressed understanding and agreed to proceed.   Other persons participating in the visit and their role in the encounter: patient's wife, Gregory Hodges.   Patients location: Home  Providers location: [facility]    Chief Complaint: frequent falls and weakness   REASON FOR CONSULTATION: BRANDON WIECHMAN is a 80 y.o. 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  Palliative ask to see for symptom management and goals of care.    SOCIAL HISTORY:     reports that he has been smoking cigarettes. He has a 30.00 pack-year smoking history. He has never used smokeless tobacco. He reports that he does not currently use alcohol. He reports that he does not use drugs.  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 and  placement of bilateral axillofemoral grafts 07/22/10 [facility])   Cancer ([place])    Skin   Crohn's disease ([place])    E coli bacteremia    History of kidney stones    Hyperlipemia    Hypertension    "denies"   Myocardial infarction ([place]) 08/11/2005   s/p Horizon study stent D1   Peripheral vascular disease ([place]) June 2011   SCLC (small cell lung carcinoma) ([place]) dx'd 10/2020   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    PAST SURGICAL HISTORY:  Past Surgical History:  Procedure Laterality Date   ABDOMINAL AORTIC ANEURYSM REPAIR  03/03/2010   AXILLARY-FEMORAL BYPASS GRAFT  07/22/2010   bilateral   bowel     herniated bowel   CARDIAC CATHETERIZATION     CORONARY STENT PLACEMENT     CYSTOSCOPY     IR IMAGING GUIDED PORT INSERTION  12/27/2020   LAMINECTOMY N/A 10/22/2020   Procedure: Lumbar five Open Laminectomy for tumor resection;  Surgeon: Judith Part, MD;  Location: [place];  Service: Neurosurgery;  Laterality: N/A;   MOHS SURGERY     several     HEMATOLOGY/ONCOLOGY HISTORY:  Oncology History  Small cell lung cancer, right ([place])  12/03/2020 Initial Diagnosis   Small cell lung cancer, right ([place])   12/03/2020 Cancer Staging   Staging form: Lung, AJCC 8th Edition - Clinical: Stage IVB (cT2a, cN2, cM1c) - Signed by Curt Bears, MD on 12/03/2020    12/31/2020 -  Chemotherapy   Patient is on Treatment Plan : LUNG SMALL CELL EXTENSIVE STAGE Durvalumab + Carboplatin D1 + Etoposide D1-3 q21d x 4 Cycles / Durvalumab q28d  ALLERGIES:  is allergic to oxycodone, codeine, lisinopril, and oxycodone hcl.  MEDICATIONS:  Current Outpatient Medications  Medication Sig Dispense Refill   acetaminophen (TYLENOL) 500 MG tablet Take 500 mg by mouth every 6 (six) hours as needed for moderate pain.     aspirin 81 MG tablet Take 81 mg by mouth daily.     atenolol (TENORMIN) 25 MG tablet Take 25 mg by mouth 2 (two) times daily.     calcium carbonate (TUMS - DOSED IN MG ELEMENTAL CALCIUM)  500 MG chewable tablet Chew 2 tablets by mouth daily as needed for indigestion or heartburn.     diphenhydrAMINE (BENADRYL) 25 MG tablet Take 50 mg by mouth daily as needed for allergies.     ferrous sulfate 325 (65 FE) MG tablet Take 325 mg by mouth every Monday, Wednesday, and Friday.     lidocaine-prilocaine (EMLA) cream Apply to the Port-A-Cath site 30-60 minutes before treatment 30 g 0   megestrol (MEGACE ES) 625 MG/5ML suspension Take 5 mLs (625 mg total) by mouth daily. 150 mL 0   nitroGLYCERIN (NITROSTAT) 0.4 MG SL tablet Place 0.4 mg under the tongue every 5 (five) minutes as needed for chest pain.     omeprazole (PRILOSEC) 10 MG capsule Take 10 mg by mouth daily.     ondansetron (ZOFRAN) 8 MG tablet Take 1 tablet (8 mg total) by mouth every 8 (eight) hours as needed for nausea or vomiting. 20 tablet 0   oxymetazoline (AFRIN) 0.05 % nasal spray Place 1 spray into both nostrils 2 (two) times daily as needed for congestion.     prochlorperazine (COMPAZINE) 10 MG tablet Take 1 tablet (10 mg total) by mouth every 6 (six) hours as needed for nausea or vomiting. 30 tablet 0   rosuvastatin (CRESTOR) 10 MG tablet Take 10 mg by mouth daily.     No current facility-administered medications for this visit.    VITAL SIGNS: There were no vitals taken for this visit. There were no vitals filed for this visit.  Estimated body mass index is 21.55 kg/m as calculated from the following:   Height as of 08/29/21: 5\' 9"  (1.753 m).   Weight as of 09/09/21: 145 lb 14.4 oz (66.2 kg).  LABS: CBC:    Component Value Date/Time   WBC 4.1 09/09/2021 1127   WBC 3.5 (L) 08/29/2021 1033   HGB 11.9 (L) 09/09/2021 1127   HCT 35.6 (L) 09/09/2021 1127   PLT 69 (L) 09/09/2021 1127   MCV 93.4 09/09/2021 1127   NEUTROABS 3.4 09/09/2021 1127   LYMPHSABS 0.2 (L) 09/09/2021 1127   MONOABS 0.5 09/09/2021 1127   EOSABS 0.0 09/09/2021 1127   BASOSABS 0.0 09/09/2021 1127   Comprehensive Metabolic Panel:     Component Value Date/Time   NA 140 09/09/2021 1127   K 4.9 09/09/2021 1127   CL 115 (H) 09/09/2021 1127   CO2 17 (L) 09/09/2021 1127   BUN 42 (H) 09/09/2021 1127   CREATININE 1.58 (H) 09/09/2021 1127   GLUCOSE 81 09/09/2021 1127   CALCIUM 8.7 (L) 09/09/2021 1127   AST 9 (L) 09/09/2021 1127   ALT 6 09/09/2021 1127   ALKPHOS 53 09/09/2021 1127   BILITOT 0.6 09/09/2021 1127   PROT 6.0 (L) 09/09/2021 1127   ALBUMIN 3.7 09/09/2021 1127    RADIOGRAPHIC STUDIES: CT Head Wo Contrast  Result Date: 09/09/2021 CLINICAL DATA:  [place], hit head EXAM: CT HEAD WITHOUT CONTRAST TECHNIQUE: Contiguous axial images were obtained from the base  of the skull through the vertex without intravenous contrast. COMPARISON:  08/29/2021 FINDINGS: Brain: No acute infarct or hemorrhage. Lateral ventricles and midline structures are stable. Chronic small vessel ischemic changes are again seen throughout the periventricular and subcortical white matter. No acute extra-axial fluid collections. No mass effect. Vascular: Stable atherosclerosis.  No hyperdense vessel. Skull: Normal. Negative for fracture or focal lesion. Sinuses/Orbits: No acute finding. Other: None. IMPRESSION: 1. Stable head CT, no acute intracranial process. Electronically Signed   By: Randa Ngo M.D.   On: 09/09/2021 16:55   CT Head Wo Contrast  Result Date: 08/29/2021 CLINICAL DATA:  Mental status change. Leg weakness for 2 days. History of small-cell lung cancer. EXAM: CT HEAD WITHOUT CONTRAST TECHNIQUE: Contiguous axial images were obtained from the base of the skull through the vertex without intravenous contrast. COMPARISON:  MR head 05/26/2021 FINDINGS: Brain: No evidence of acute infarction, hemorrhage, hydrocephalus, extra-axial collection or mass lesion/mass effect. Hypodensities in the periventricular and subcortical white matter, consistent with chronic small-vessel ischemic changes. Vascular: No hyperdense vessel or unexpected calcification.  Skull: Normal. Negative for fracture or focal lesion. Sinuses/Orbits: No acute finding. Other: None. IMPRESSION: No acute intracranial abnormality. Electronically Signed   By: Ileana Roup M.D.   On: 08/29/2021 10:56   CT Cervical Spine Wo Contrast  Result Date: 09/09/2021 CLINICAL DATA:  [place], hit head EXAM: CT CERVICAL SPINE WITHOUT CONTRAST TECHNIQUE: Multidetector CT imaging of the cervical spine was performed without intravenous contrast. Multiplanar CT image reconstructions were also generated. COMPARISON:  None. FINDINGS: Alignment: There is reversal of cervical lordosis centered at C4, likely due to multilevel facet hypertrophy and spondylosis. Otherwise alignment is anatomic. Skull base and vertebrae: No acute fracture. No primary bone lesion or focal pathologic process. Soft tissues and spinal canal: No prevertebral fluid or swelling. No visible canal hematoma. Marked atherosclerosis at the carotid bifurcations. Right internal jugular catheter partially visualized. Disc levels: Prominent hypertrophic changes at the C1-C2 interface. Mild spondylosis from C3 through C6. Prominent facet hypertrophic changes at C2-3 and C3-4. Upper chest: Airway is patent. Emphysematous changes are seen at the lung apices. Stable 4 mm left apical nodule. Other: Reconstructed images demonstrate no additional findings. IMPRESSION: 1. No acute cervical spine fracture. 2. Multilevel spondylosis and facet hypertrophy. Electronically Signed   By: Randa Ngo M.D.   On: 09/09/2021 16:57   DG Chest Portable 1 View  Result Date: 08/29/2021 CLINICAL DATA:  Altered mental status. EXAM: PORTABLE CHEST 1 VIEW COMPARISON:  Chest radiograph 01/23/2021; CT chest 08/11/2021 FINDINGS: Right IJ power injectable port central venous catheter tip projects at the level of the mid SVC. The heart size and mediastinal contours are within normal limits. Aortic calcifications. Both lungs are clear. Hiatal hernia. Sclerotic lesion an old  fracture of the posterior right seventh rib, better appreciated on recent cross-sectional imaging. No acute osseous abnormality. Surgical clips overlie the upper chest wall bilaterally. IMPRESSION: 1. No acute cardiopulmonary abnormality. 2. Sclerotic lesion and healing fracture of the posterior right seventh rib, better appreciated on recent cross-sectional imaging. 3. Aortic Atherosclerosis (ICD10-I70.0). 4. Hiatal hernia, stable. Electronically Signed   By: Ileana Roup M.D.   On: 08/29/2021 09:51   DG Hand Complete Right  Result Date: 09/09/2021 CLINICAL DATA:  Fall, hand pain, laceration EXAM: RIGHT HAND - COMPLETE 3+ VIEW COMPARISON:  None. FINDINGS: No acute bony abnormality. Specifically, no fracture, subluxation, or dislocation. Joint spaces maintained. Old ulnar styloid fracture. Soft tissues are intact. IMPRESSION: No acute bony abnormality. Electronically  Signed   By: Rolm Baptise M.D.   On: 09/09/2021 17:29    PERFORMANCE STATUS (ECOG) : 2 - Symptomatic, <50% confined to bed  Review of Systems  Constitutional:  Positive for appetite change and fatigue.  Neurological:  Positive for weakness.       Falls   Unless otherwise noted, a complete review of systems is negative.    IMPRESSION:  This is my initial encounter with Mr. Solanki and his wife.  Visit was via phone due to patient's increased weakness and inability to be seen face-to-face in the office today.  I introduced myself, Advertising copywriter, and palliative's role in collaboration with his oncology medical team.Concept of Palliative Care was introduced as specialized medical care for people and their families living with serious illness.  It focuses on providing relief from the symptoms and stress of a serious illness.  The goal is to improve quality of life for both the patient and the family. Values and goals of care important to patient and family were attempted to be elicited.  Patient and wife verbalized understanding and  appreciation.  Mr. Jablonsky shares he lives in the home with his wife of more than 50 years.  They have 2 children and 2 grandchildren.  He is a retired Journalist, newspaper.  In the home he is ambulatory with standby assist most recently.  No current assistive devices.  Able to perform most ADLs independently however with noticeable increased fatigue.  Reports appetite is poor with approximately 40 pound weight loss over the past 6 days 9 months.  Feels as though appetite is improving and has gained 3 pounds.  Mr. Harbuck states he is having frequent falls.  Reports the falls are not new however are just becoming more frequent.  He recently went to [facility] long ED for work-up after falling down 2 stairs backwards and hitting his head.  States he has a bruise on his sacral area with tenderness.  Does not interfere with his ability to ambulate.  States he feels a lot more unsteady over the past several weeks in addition to weakness.  He does not have a walker.  I personally reviewed records from his ED work-up.  Patient unfortunately was not seen by a provider as he left because of the long wait and extreme pain sitting in the main lobby.  CT of spine and head negative for acute abnormalities.  Hand x-ray negative for fracture.  Lab work consistent with previous labs weeks prior.  We discussed at length patient's generalized weakness.  He states at times his legs will give out and other times is because he is so weak and fatigued.  He is aware Dr. Lew Dawes nurse has placed a home health referral for PT and he should be hearing from them next week.  Advised this will be most beneficial to assist with safe transferring and ambulating in the home and also identifying any needed home equipment.  Given he does not have a walker in the home we will send over a referral to hopefully assist with ambulating and further prevent falls.  Patient is wife endorses some increase in his appetite.  He is tolerating Megace as  ordered.  Advised to continue to take as they are seeing some improvement and hopefully this will continue.  Education provided on protein enriched foods in addition to protein shakes, small frequent meals versus 3 large meals a day.  Mr. Kazmierski endorses occasional constipation stating he will often go 3 days 4  days without a bowel movement.  Recommended daily MiraLAX which his wife will plan to purchase and keep in the home.  He is not have any concerns sleeping and will often take naps throughout the day and sleep throughout the night.  We discussed Her current illness and what it means in the larger context of Her on-going co-morbidities. Natural disease trajectory and expectations were discussed.  Mr. Bessey and his wife are hopeful for some stability.  He understands his upcoming appointments with plans to proceed with next cycle of immunotherapy.  I discussed the importance of continued conversation with family and their medical providers regarding overall plan of care and treatment options, ensuring decisions are within the context of the patients values and GOCs.  PLAN: Per Dr. Earlie Server a home health referral has been placed by his nurse for PT.  Patient aware and further education provided on the importance of having them come out into the home and assist with some exercising and evaluating any further home needs. Continue with Megace as prescribed.  Reports some increase in appetite and a 3 pound weight gain.  Education provided on increased protein/protein shakes, small frequent meals throughout the day versus 3 large meals. Order sent for home walker to assist with ambulation and stability. I will plan to see patient back in the clinic and 2-4 weeks in collaboration with his other oncology appointments.   Patient expressed understanding and was in agreement with this plan. He also understands that He can call the clinic at any time with any questions, concerns, or complaints.     Time  Total: 45 min.   Visit consisted of counseling and education dealing with the complex and emotionally intense issues of symptom management and palliative care in the setting of serious and potentially life-threatening illness.Greater than 50%  of this time was spent counseling and coordinating care related to the above assessment and plan.  Signed by: Alda Lea, AGPCNP-BC Palliative Medicine Team

## 2021-09-12 NOTE — Telephone Encounter (Signed)
[facility] has agreed to take pt  for home care.  They will contact pt within 24-48 hours. [facility] is unable to staff pt.

## 2021-09-12 NOTE — Telephone Encounter (Signed)
Wife called to report she just witnessed pt fall. He got out of chair to put his coat on he " listed to the side and fell"  He never lost consciousness.   Wife said he has a walker and she will encourage him to use it. I instructed her to have someone walk with him at all times and even with walker.  She said there is no bleeding , no change in LOC, no obvious injury. He is sitting in chair now. I instructed her to call EMS if he falls again /or if pt has any change in LOC, bleeding.  Wife voiced understanding.

## 2021-09-13 ENCOUNTER — Emergency Department (HOSPITAL_COMMUNITY): Payer: PPO

## 2021-09-13 ENCOUNTER — Encounter (HOSPITAL_COMMUNITY): Payer: Self-pay

## 2021-09-13 ENCOUNTER — Inpatient Hospital Stay (HOSPITAL_COMMUNITY)
Admission: EM | Admit: 2021-09-13 | Discharge: 2021-09-22 | DRG: 641 | Disposition: A | Discharge status: Skilled Nursing Facility | Payer: PPO | Attending: Internal Medicine | Admitting: Internal Medicine

## 2021-09-13 DIAGNOSIS — R5383 Other fatigue: Secondary | ICD-10-CM | POA: Diagnosis not present

## 2021-09-13 DIAGNOSIS — E872 Acidosis, unspecified: Secondary | ICD-10-CM | POA: Diagnosis present

## 2021-09-13 DIAGNOSIS — I739 Peripheral vascular disease, unspecified: Secondary | ICD-10-CM | POA: Diagnosis present

## 2021-09-13 DIAGNOSIS — K219 Gastro-esophageal reflux disease without esophagitis: Secondary | ICD-10-CM | POA: Diagnosis present

## 2021-09-13 DIAGNOSIS — Z7982 Long term (current) use of aspirin: Secondary | ICD-10-CM

## 2021-09-13 DIAGNOSIS — I252 Old myocardial infarction: Secondary | ICD-10-CM

## 2021-09-13 DIAGNOSIS — E785 Hyperlipidemia, unspecified: Secondary | ICD-10-CM | POA: Diagnosis present

## 2021-09-13 DIAGNOSIS — W19XXXA Unspecified fall, initial encounter: Secondary | ICD-10-CM

## 2021-09-13 DIAGNOSIS — E441 Mild protein-calorie malnutrition: Secondary | ICD-10-CM | POA: Diagnosis present

## 2021-09-13 DIAGNOSIS — Z955 Presence of coronary angioplasty implant and graft: Secondary | ICD-10-CM

## 2021-09-13 DIAGNOSIS — C7931 Secondary malignant neoplasm of brain: Secondary | ICD-10-CM | POA: Diagnosis present

## 2021-09-13 DIAGNOSIS — Z888 Allergy status to other drugs, medicaments and biological substances status: Secondary | ICD-10-CM

## 2021-09-13 DIAGNOSIS — R102 Pelvic and perineal pain: Secondary | ICD-10-CM | POA: Diagnosis not present

## 2021-09-13 DIAGNOSIS — I959 Hypotension, unspecified: Secondary | ICD-10-CM | POA: Diagnosis not present

## 2021-09-13 DIAGNOSIS — R339 Retention of urine, unspecified: Secondary | ICD-10-CM | POA: Diagnosis present

## 2021-09-13 DIAGNOSIS — R531 Weakness: Secondary | ICD-10-CM | POA: Diagnosis not present

## 2021-09-13 DIAGNOSIS — Z6820 Body mass index (BMI) 20.0-20.9, adult: Secondary | ICD-10-CM

## 2021-09-13 DIAGNOSIS — E46 Unspecified protein-calorie malnutrition: Secondary | ICD-10-CM | POA: Diagnosis present

## 2021-09-13 DIAGNOSIS — W1830XA Fall on same level, unspecified, initial encounter: Secondary | ICD-10-CM | POA: Diagnosis present

## 2021-09-13 DIAGNOSIS — K449 Diaphragmatic hernia without obstruction or gangrene: Secondary | ICD-10-CM | POA: Diagnosis not present

## 2021-09-13 DIAGNOSIS — D61818 Other pancytopenia: Secondary | ICD-10-CM | POA: Diagnosis not present

## 2021-09-13 DIAGNOSIS — I714 Abdominal aortic aneurysm, without rupture, unspecified: Secondary | ICD-10-CM | POA: Diagnosis present

## 2021-09-13 DIAGNOSIS — R296 Repeated falls: Secondary | ICD-10-CM | POA: Diagnosis present

## 2021-09-13 DIAGNOSIS — Z87442 Personal history of urinary calculi: Secondary | ICD-10-CM

## 2021-09-13 DIAGNOSIS — Z885 Allergy status to narcotic agent status: Secondary | ICD-10-CM

## 2021-09-13 DIAGNOSIS — I10 Essential (primary) hypertension: Secondary | ICD-10-CM | POA: Diagnosis present

## 2021-09-13 DIAGNOSIS — E878 Other disorders of electrolyte and fluid balance, not elsewhere classified: Secondary | ICD-10-CM | POA: Diagnosis present

## 2021-09-13 DIAGNOSIS — N19 Unspecified kidney failure: Secondary | ICD-10-CM | POA: Diagnosis not present

## 2021-09-13 DIAGNOSIS — Z79899 Other long term (current) drug therapy: Secondary | ICD-10-CM

## 2021-09-13 DIAGNOSIS — Z515 Encounter for palliative care: Secondary | ICD-10-CM

## 2021-09-13 DIAGNOSIS — E2740 Unspecified adrenocortical insufficiency: Secondary | ICD-10-CM | POA: Diagnosis present

## 2021-09-13 DIAGNOSIS — Z66 Do not resuscitate: Secondary | ICD-10-CM | POA: Diagnosis not present

## 2021-09-13 DIAGNOSIS — Z85828 Personal history of other malignant neoplasm of skin: Secondary | ICD-10-CM

## 2021-09-13 DIAGNOSIS — N179 Acute kidney failure, unspecified: Secondary | ICD-10-CM | POA: Diagnosis present

## 2021-09-13 DIAGNOSIS — Y92009 Unspecified place in unspecified non-institutional (private) residence as the place of occurrence of the external cause: Secondary | ICD-10-CM

## 2021-09-13 DIAGNOSIS — E860 Dehydration: Principal | ICD-10-CM | POA: Diagnosis present

## 2021-09-13 DIAGNOSIS — E876 Hypokalemia: Secondary | ICD-10-CM | POA: Diagnosis not present

## 2021-09-13 DIAGNOSIS — C7972 Secondary malignant neoplasm of left adrenal gland: Secondary | ICD-10-CM | POA: Diagnosis present

## 2021-09-13 DIAGNOSIS — C7951 Secondary malignant neoplasm of bone: Secondary | ICD-10-CM | POA: Diagnosis present

## 2021-09-13 DIAGNOSIS — D849 Immunodeficiency, unspecified: Secondary | ICD-10-CM | POA: Diagnosis present

## 2021-09-13 DIAGNOSIS — G9389 Other specified disorders of brain: Secondary | ICD-10-CM | POA: Diagnosis not present

## 2021-09-13 DIAGNOSIS — F17210 Nicotine dependence, cigarettes, uncomplicated: Secondary | ICD-10-CM | POA: Diagnosis present

## 2021-09-13 DIAGNOSIS — K5090 Crohn's disease, unspecified, without complications: Secondary | ICD-10-CM | POA: Diagnosis present

## 2021-09-13 DIAGNOSIS — I951 Orthostatic hypotension: Secondary | ICD-10-CM | POA: Diagnosis present

## 2021-09-13 DIAGNOSIS — C3491 Malignant neoplasm of unspecified part of right bronchus or lung: Secondary | ICD-10-CM | POA: Diagnosis present

## 2021-09-13 DIAGNOSIS — C7971 Secondary malignant neoplasm of right adrenal gland: Secondary | ICD-10-CM | POA: Diagnosis present

## 2021-09-13 DIAGNOSIS — Z20822 Contact with and (suspected) exposure to covid-19: Secondary | ICD-10-CM | POA: Diagnosis present

## 2021-09-13 LAB — CBC WITH DIFFERENTIAL/PLATELET
Abs Immature Granulocytes: 0.02 10*3/uL (ref 0.00–0.07)
Basophils Absolute: 0 10*3/uL (ref 0.0–0.1)
Basophils Relative: 0 %
Eosinophils Absolute: 0 10*3/uL (ref 0.0–0.5)
Eosinophils Relative: 1 %
HCT: 34.2 % — ABNORMAL LOW (ref 39.0–52.0)
Hemoglobin: 11 g/dL — ABNORMAL LOW (ref 13.0–17.0)
Immature Granulocytes: 0 %
Lymphocytes Relative: 4 %
Lymphs Abs: 0.2 10*3/uL — ABNORMAL LOW (ref 0.7–4.0)
MCH: 30.9 pg (ref 26.0–34.0)
MCHC: 32.2 g/dL (ref 30.0–36.0)
MCV: 96.1 fL (ref 80.0–100.0)
Monocytes Absolute: 0.6 10*3/uL (ref 0.1–1.0)
Monocytes Relative: 12 %
Neutro Abs: 3.7 10*3/uL (ref 1.7–7.7)
Neutrophils Relative %: 83 %
Platelets: 100 10*3/uL — ABNORMAL LOW (ref 150–400)
RBC: 3.56 MIL/uL — ABNORMAL LOW (ref 4.22–5.81)
RDW: 15.8 % — ABNORMAL HIGH (ref 11.5–15.5)
WBC: 4.5 10*3/uL (ref 4.0–10.5)
nRBC: 0 % (ref 0.0–0.2)

## 2021-09-13 LAB — COMPREHENSIVE METABOLIC PANEL
ALT: 9 U/L (ref 0–44)
AST: 11 U/L — ABNORMAL LOW (ref 15–41)
Albumin: 3.5 g/dL (ref 3.5–5.0)
Alkaline Phosphatase: 46 U/L (ref 38–126)
Anion gap: 7 (ref 5–15)
BUN: 36 mg/dL — ABNORMAL HIGH (ref 8–23)
CO2: 18 mmol/L — ABNORMAL LOW (ref 22–32)
Calcium: 8.8 mg/dL — ABNORMAL LOW (ref 8.9–10.3)
Chloride: 115 mmol/L — ABNORMAL HIGH (ref 98–111)
Creatinine, Ser: 1.41 mg/dL — ABNORMAL HIGH (ref 0.61–1.24)
GFR, Estimated: 51 mL/min — ABNORMAL LOW (ref >60)
Glucose, Bld: 90 mg/dL (ref 70–99)
Potassium: 4.6 mmol/L (ref 3.5–5.1)
Sodium: 140 mmol/L (ref 135–145)
Total Bilirubin: 0.5 mg/dL (ref 0.3–1.2)
Total Protein: 5.7 g/dL — ABNORMAL LOW (ref 6.5–8.1)

## 2021-09-13 LAB — URINALYSIS, ROUTINE W REFLEX MICROSCOPIC
Bilirubin Urine: NEGATIVE
Glucose, UA: NEGATIVE mg/dL
Hgb urine dipstick: NEGATIVE
Ketones, ur: NEGATIVE mg/dL
Leukocytes,Ua: NEGATIVE
Nitrite: NEGATIVE
Protein, ur: NEGATIVE mg/dL
Specific Gravity, Urine: 1.023 (ref 1.005–1.030)
pH: 5 (ref 5.0–8.0)

## 2021-09-13 LAB — LACTIC ACID, PLASMA: Lactic Acid, Venous: 1.1 mmol/L (ref 0.5–1.9)

## 2021-09-13 LAB — RESP PANEL BY RT-PCR (FLU A&B, COVID) ARPGX2
Influenza A by PCR: NEGATIVE
Influenza B by PCR: NEGATIVE
SARS Coronavirus 2 by RT PCR: NEGATIVE

## 2021-09-13 LAB — ETHANOL: Alcohol, Ethyl (B): <10 mg/dL (ref <10)

## 2021-09-13 IMAGING — DX DG HIP (WITH OR WITHOUT PELVIS) 2-3V*L*
2 series · 2 of 2 positions shown · non-contrast
Comparison: None.

CLINICAL DATA: Pain with weight-bearing.

EXAM:
DG HIP (WITH OR WITHOUT PELVIS) 2-3V LEFT

[hip ap]
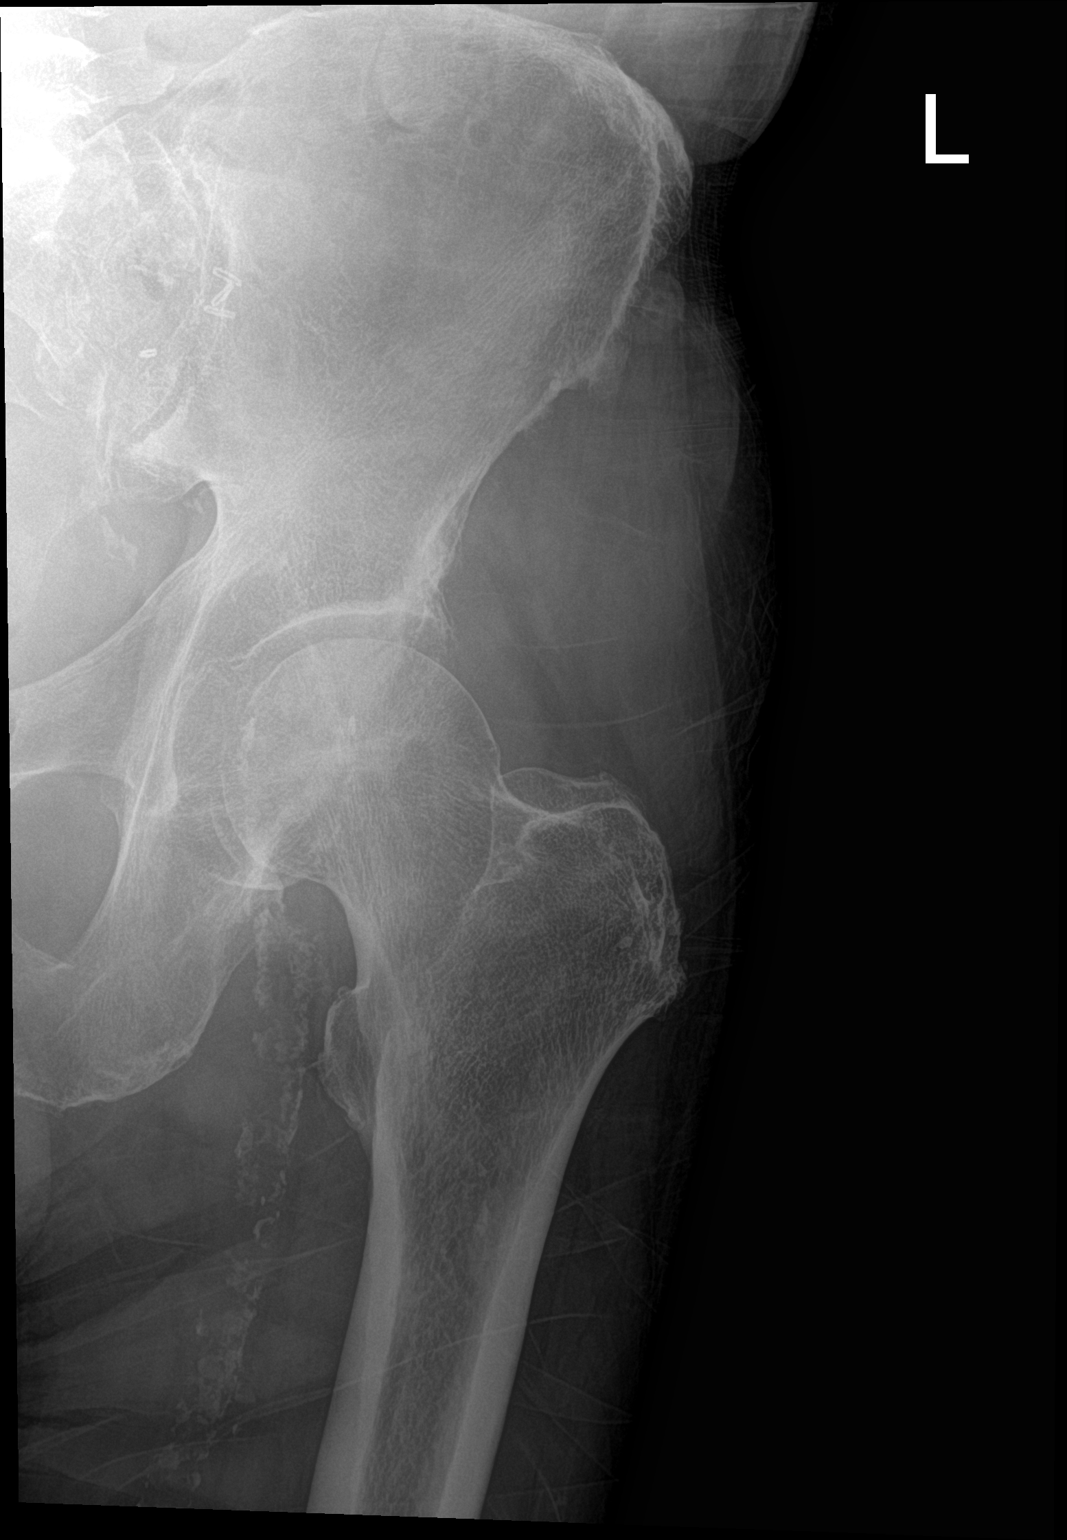

[hip lat]
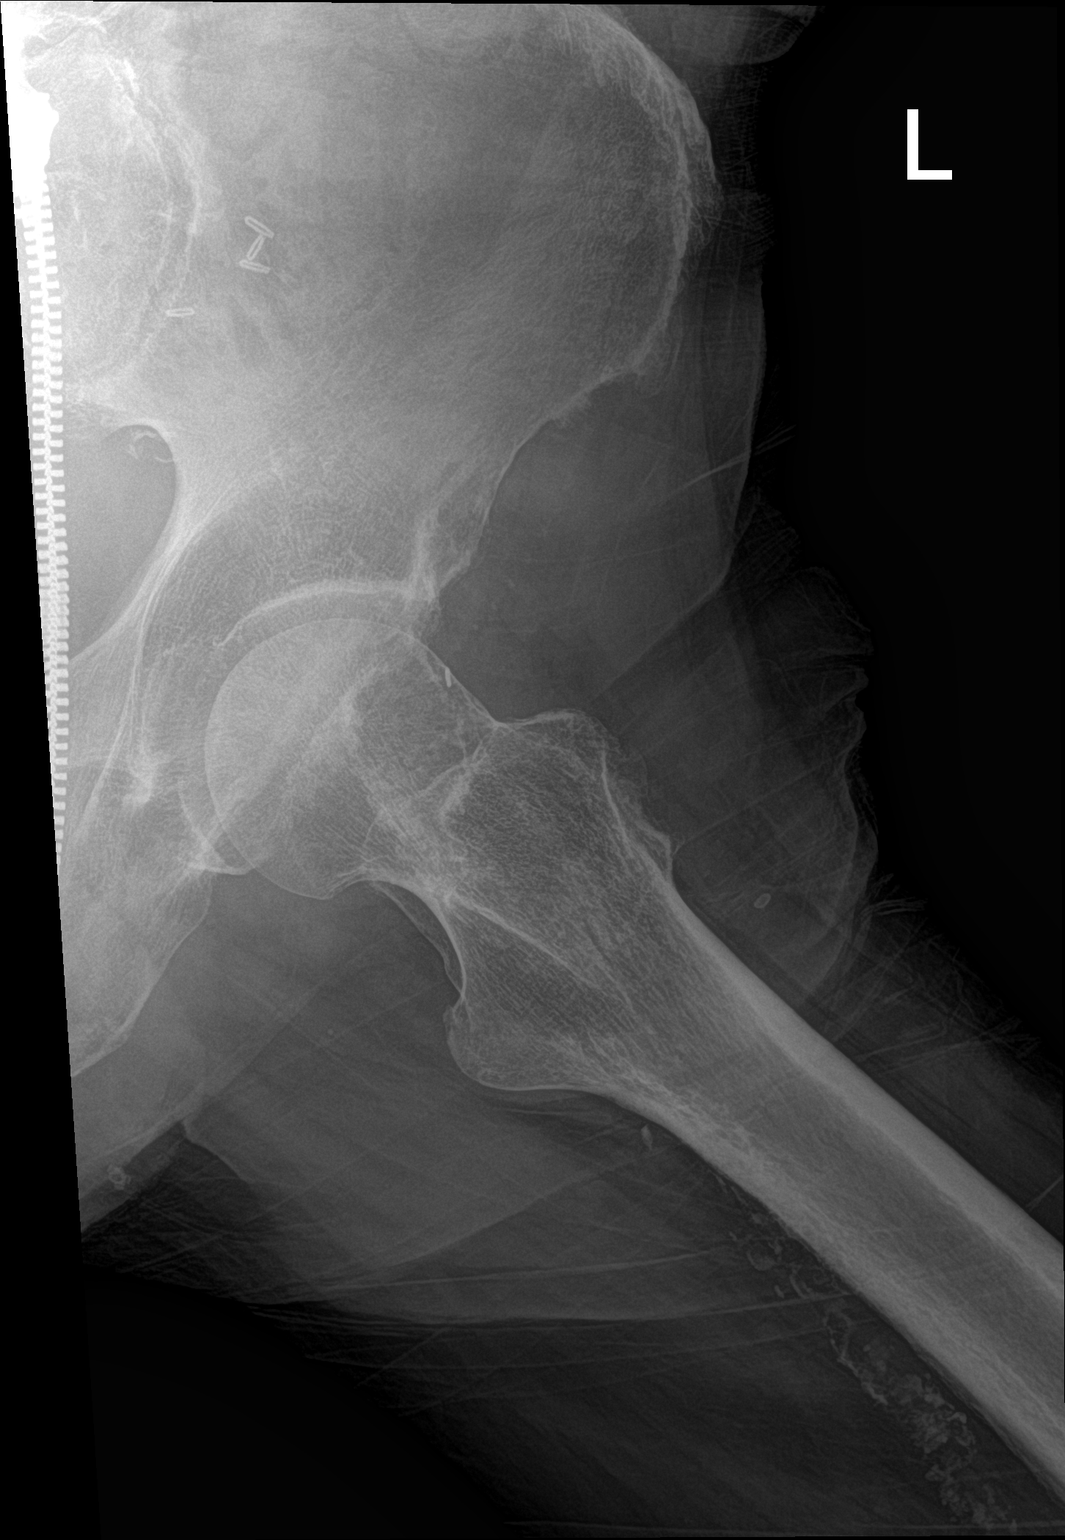

[2 of 2 positions shown; findings below may reference images not displayed]

FINDINGS: There is no evidence of hip fracture or dislocation. Mild
degenerative changes are noted in the form of acetabular sclerosis.
Marked severity vascular calcification is seen.
IMPRESSION: 1. No acute fracture or dislocation.
2. Mild degenerative changes.

## 2021-09-13 IMAGING — DX DG PELVIS 1-2V
1 series · 1 of 1 positions shown · non-contrast
Comparison: None.

CLINICAL DATA: Pain after fall.

EXAM:
PELVIS - 1-2 VIEW

[pelvis ap]
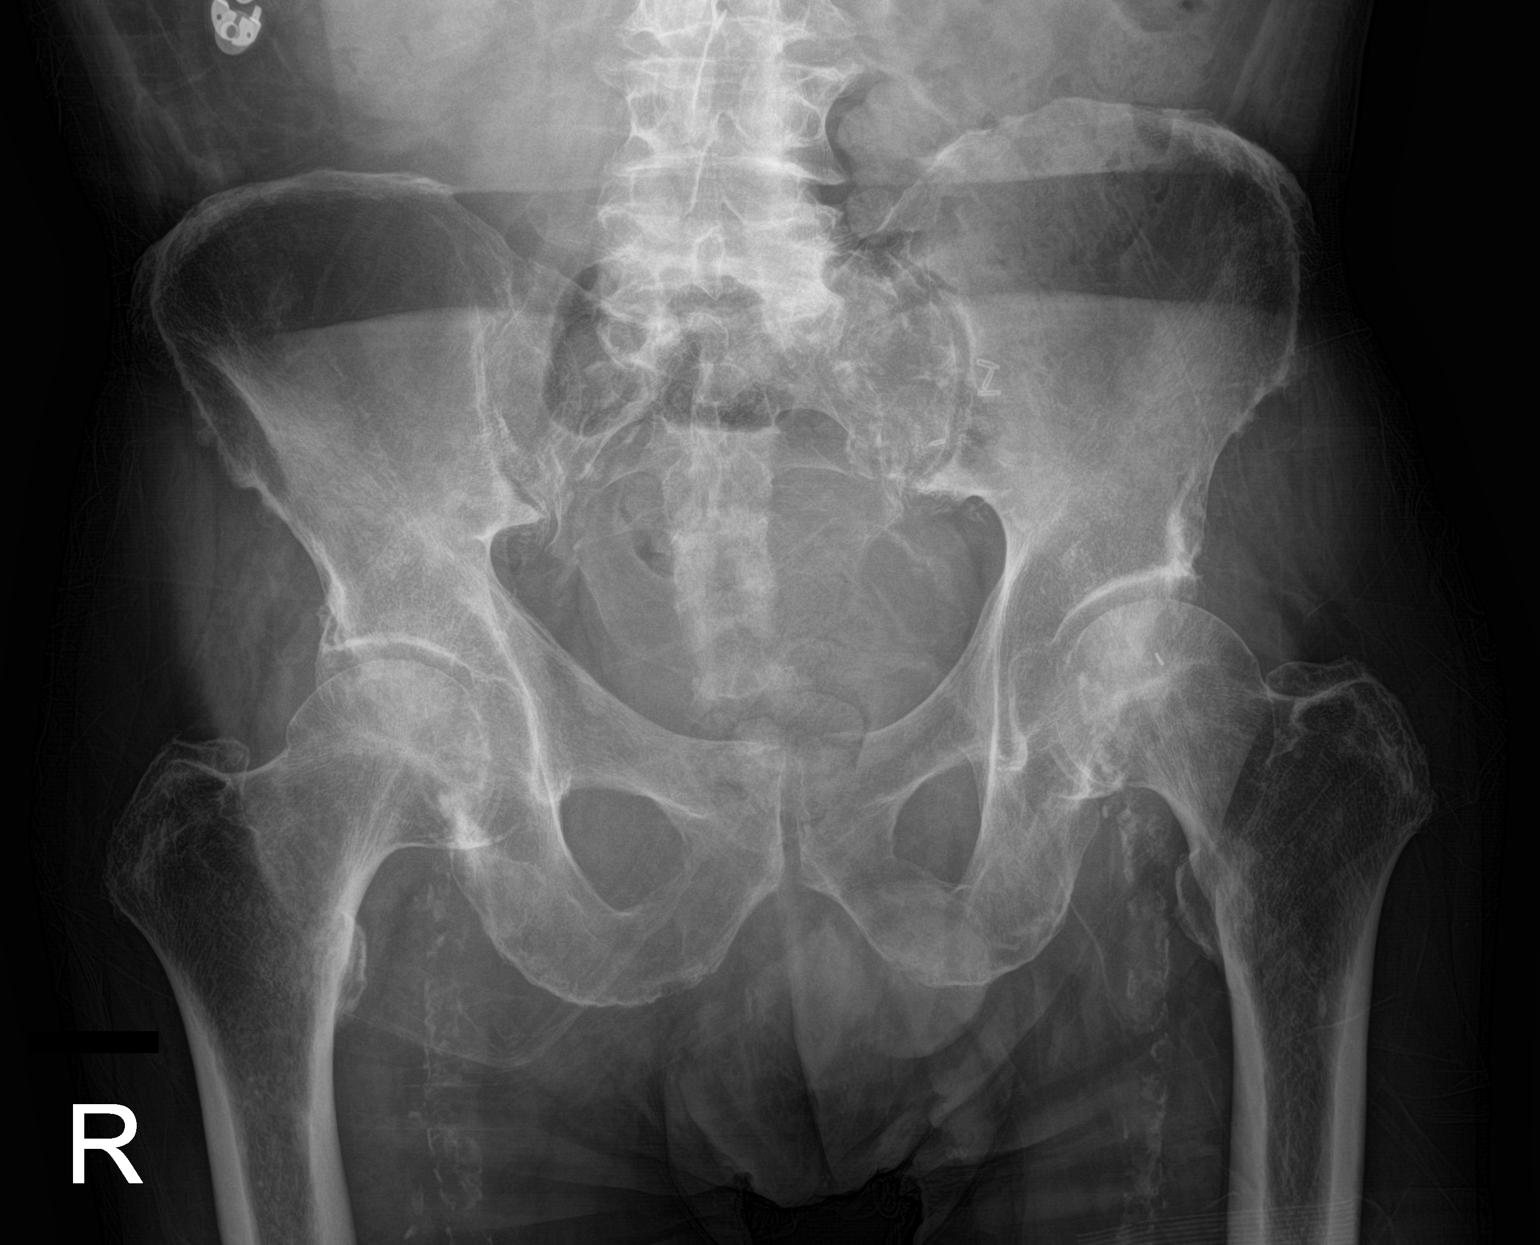

[1 of 1 positions shown; findings below may reference images not displayed]

FINDINGS: There is no evidence of pelvic fracture or diastasis. No pelvic bone
lesions are seen.
IMPRESSION: Negative.

## 2021-09-13 IMAGING — MR MR HEAD WO/W CM
15 series · 48 of 48 positions shown · IV contrast (gadavist)
Comparison: CT head [DATE], MR head [DATE]

CLINICAL DATA: Weakness and fatigue, history of lung cancer

EXAM:
MRI HEAD WITHOUT AND WITH CONTRAST
TECHNIQUE: Multiplanar, multiecho pulse sequences of the brain and surrounding
structures were obtained without and with intravenous contrast.
CONTRAST:  7mL GADAVIST GADOBUTROL 1 MMOL/ML IV SOLN

[Series 5: T1 · sagittal · 5.0mm · 0.75mm/px · 1 of 24 slices shown (1 of 4)]
[im 1/24]
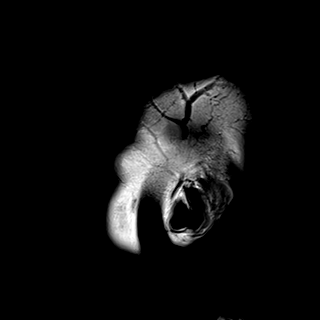

[Series 6: DWI · axial · 3.0mm · 1.36mm/px · z∈[-43,+106]mm · 5 of 104 slices shown (1 of 2)]
[im 1/104]
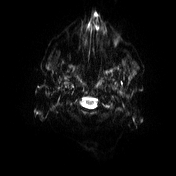
[im 26/104]
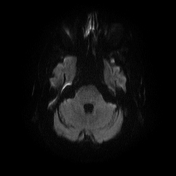
[im 52/104]
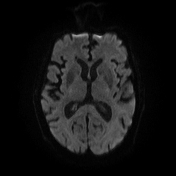
[im 78/104]
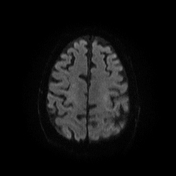
[im 104/104]
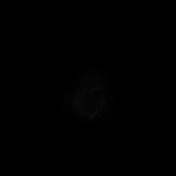

[Series 7: DWI · axial · 3.0mm · 1.36mm/px · z∈[-43,+106]mm · 2 of 52 slices shown (2 of 2)]
[im 1/52]
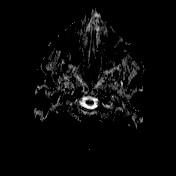
[im 52/52]
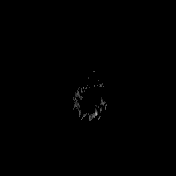

[Series 8: T2 · axial · 5.0mm · 0.62mm/px · 1 of 26 slices shown]
[im 1/26]
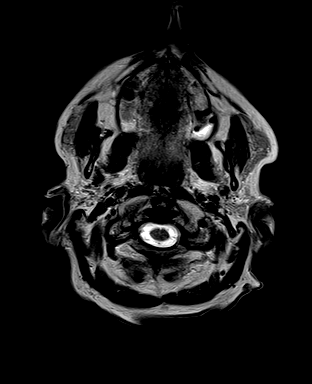

[Series 9: swi_images · axial · 3.0mm · 0.75mm/px · z∈[-74,+132]mm · 4 of 72 slices shown]
[im 1/72]
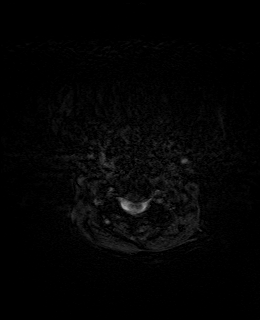
[im 24/72]
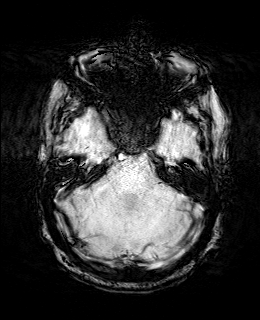
[im 48/72]
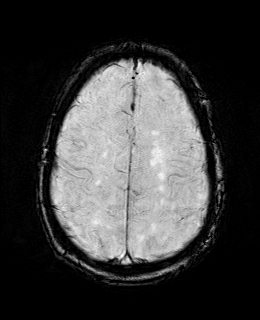
[im 72/72]
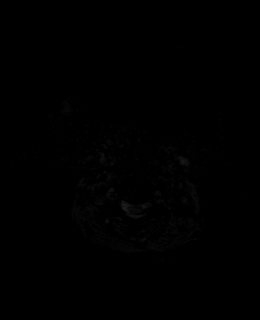

[Series 11: FLAIR · axial · 3.0mm · 0.75mm/px · z∈[-45,+103]mm · 3 of 52 slices shown]
[im 1/52]
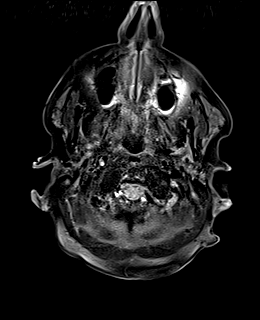
[im 26/52]
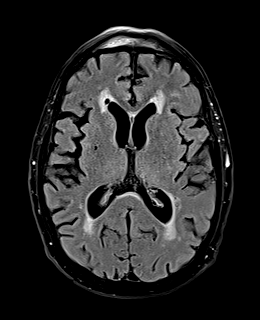
[im 52/52]
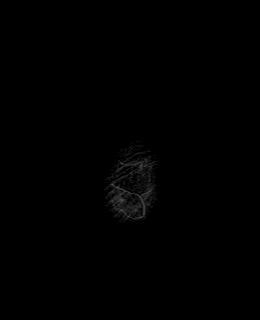

[Series 12: T1 · axial · 1.0mm · 0.94mm/px · z∈[-48,+106]mm · 9 of 160 slices shown (2 of 4)]
[im 1/160]
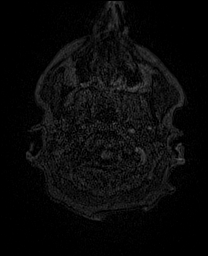
[im 20/160]
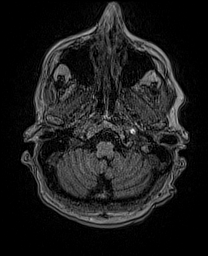
[im 40/160]
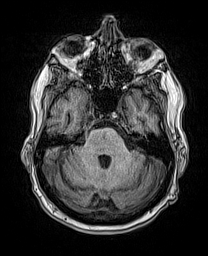
[im 60/160]
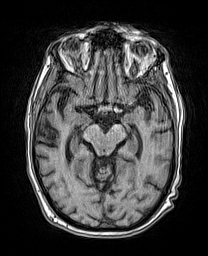
[im 80/160]
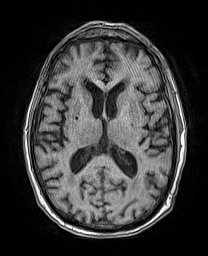
[im 100/160]
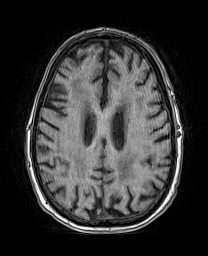
[im 120/160]
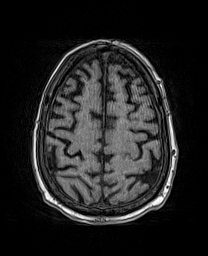
[im 140/160]
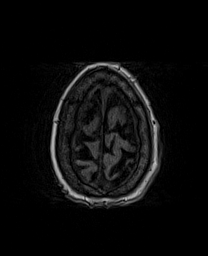
[im 160/160]
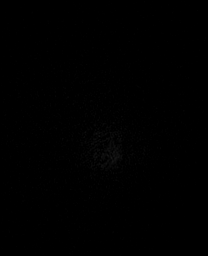

[Series 13: cor dwi_tracew · coronal · 5.0mm · 1.53mm/px · 3 of 60 slices shown]
[im 1/60]
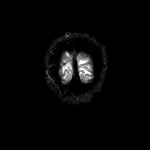
[im 30/60]
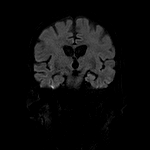
[im 60/60]
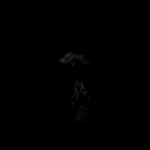

[Series 14: cor dwi_adc · coronal · 5.0mm · 1.53mm/px · 2 of 30 slices shown]
[im 1/30]
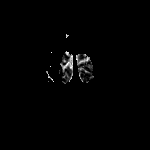
[im 30/30]
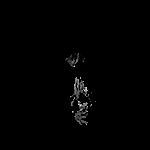

[Series 15: T2 post-contrast · coronal · 5.0mm · 0.57mm/px · 2 of 30 slices shown]
[im 1/30]
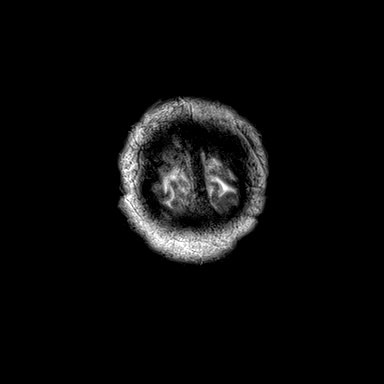
[im 30/30]
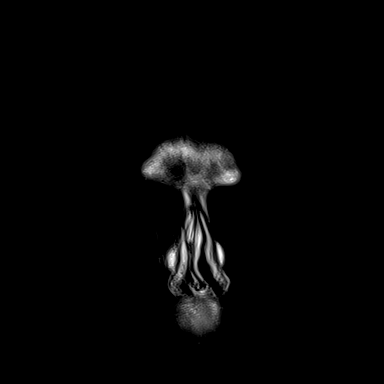

[Series 16: T1 post-contrast · axial · 1.0mm · 0.94mm/px · z∈[-48,+106]mm · 9 of 160 slices shown (1 of 3)]
[im 1/160]
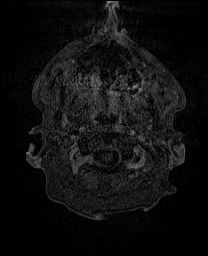
[im 20/160]
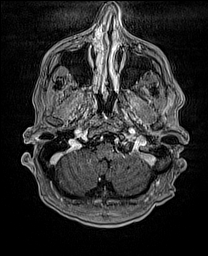
[im 40/160]
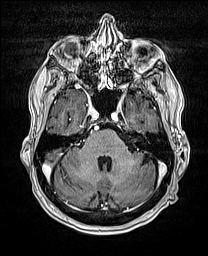
[im 60/160]
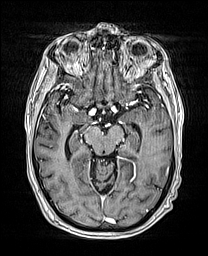
[im 80/160]
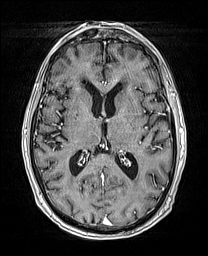
[im 100/160]
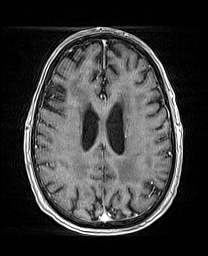
[im 120/160]
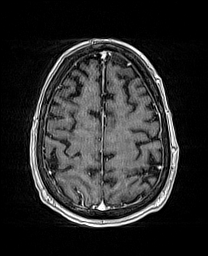
[im 140/160]
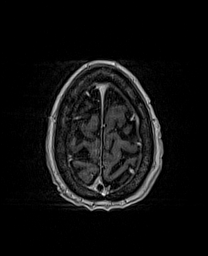
[im 160/160]
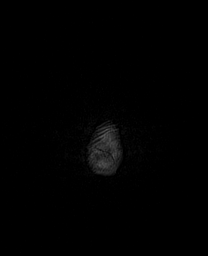

[Series 17: T1 · sagittal · 4.0mm · 0.94mm/px · 2 of 30 slices shown (3 of 4)]
[im 1/30]
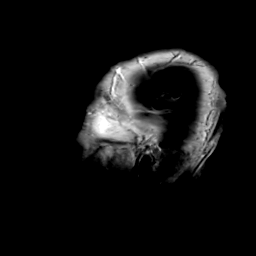
[im 30/30]
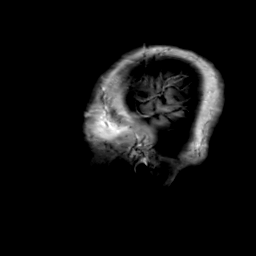

[Series 18: T1 · coronal · 4.0mm · 0.94mm/px · 2 of 30 slices shown (4 of 4)]
[im 1/30]
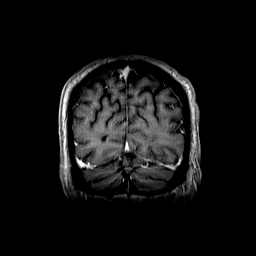
[im 30/30]
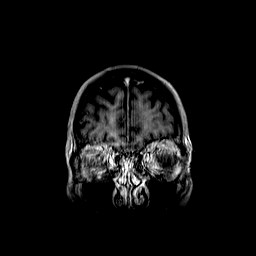

[Series 19: T1 post-contrast · coronal · 5.0mm · 0.43mm/px · 2 of 30 slices shown (2 of 3)]
[im 1/30]
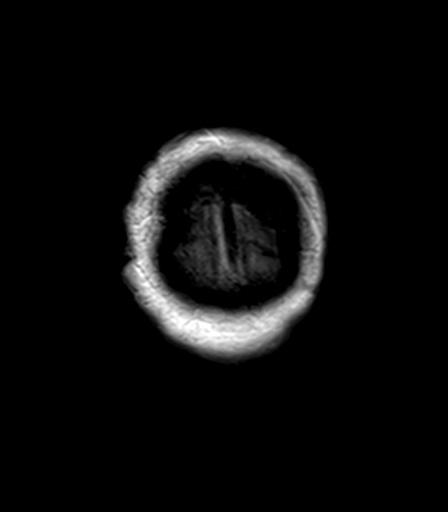
[im 30/30]
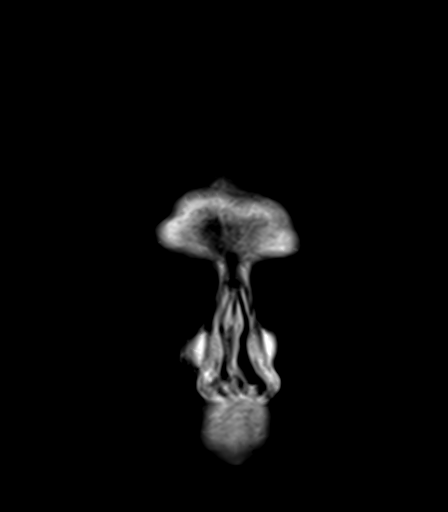

[Series 20: T1 post-contrast · sagittal · 5.0mm · 0.75mm/px · 1 of 24 slices shown (3 of 3)]
[im 1/24]
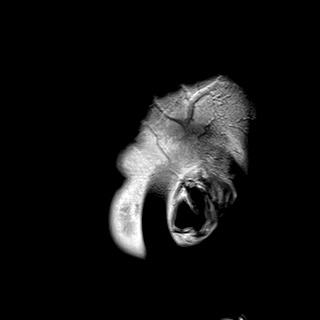

[48 of 48 positions shown; findings below may reference images not displayed]

FINDINGS: Brain: There is no evidence of acute intracranial hemorrhage,
extra-axial fluid collection, or acute infarct.

There is mild-to-moderate global parenchymal volume loss. Patchy
FLAIR signal abnormality throughout the subcortical and
periventricular white matter likely reflects sequela of moderate
chronic white matter microangiopathy. Foci of SWI signal dropout in
the left frontal lobe anteriorly and left temporal lobe laterally
are unchanged consistent with old blood products probably related to
prior metastatic disease.

The previously seen enhancing lesion in the right cerebellar
hemisphere is no longer identified. SWI signal dropout is seen in
this location consistent with old blood products related to the
previously metastatic lesion. The previously seen enhancing lesion
in the medial right temporal lobe is also no longer identified.
There was likely a lesion n in the medial right frontal lobe on the
prior study which is also no longer present.

There is no evidence of active intracranial metastatic disease on
the current study.

Vascular: Normal flow voids.

Skull and upper cervical spine: Normal marrow signal.

Sinuses/Orbits: The paranasal sinuses are clear. The globes and
orbits are unremarkable.

Other: None.
IMPRESSION: 1. Previously seen enhancing lesions in the right cerebellar
hemisphere, right frontal lobe, and right medial temporal lobe are
no longer identified. No evidence of active intracranial metastatic
disease on the current study.
2. No other acute intracranial pathology.
3. Unchanged global parenchymal volume loss and moderate chronic
white matter microangiopathy.

## 2021-09-13 MED ORDER — SODIUM CHLORIDE 0.9 % IV SOLN: INTRAVENOUS | Status: DC

## 2021-09-13 MED ORDER — SODIUM CHLORIDE 0.9 % IV BOLUS
1000.0000 mL | Freq: Once | INTRAVENOUS | Status: AC
  Administered 2021-09-13: 1000 mL via INTRAVENOUS

## 2021-09-13 MED ORDER — SULFAMETHOXAZOLE-TRIMETHOPRIM 800-160 MG PO TABS
1.0000 | ORAL_TABLET | Freq: Two times a day (BID) | ORAL | Status: DC
  Administered 2021-09-14 – 2021-09-22 (×17): 1 via ORAL
  Filled 2021-09-13 (×17): qty 1

## 2021-09-13 MED ORDER — MEGESTROL ACETATE 400 MG/10ML PO SUSP
400.0000 mg | Freq: Two times a day (BID) | ORAL | Status: DC
  Administered 2021-09-13 – 2021-09-22 (×18): 400 mg via ORAL
  Filled 2021-09-13 (×20): qty 10

## 2021-09-13 MED ORDER — NICOTINE 21 MG/24HR TD PT24
21.0000 mg | MEDICATED_PATCH | Freq: Every day | TRANSDERMAL | Status: DC | PRN
  Administered 2021-09-14 – 2021-09-15 (×2): 21 mg via TRANSDERMAL
  Filled 2021-09-13 (×2): qty 1

## 2021-09-13 MED ORDER — LACTATED RINGERS IV SOLN
INTRAVENOUS | Status: AC
  Administered 2021-09-13: 1000 mL via INTRAVENOUS

## 2021-09-13 MED ORDER — ONDANSETRON HCL 4 MG/2ML IJ SOLN: 4.0000 mg | Freq: Four times a day (QID) | INTRAMUSCULAR | Status: DC | PRN

## 2021-09-13 MED ORDER — PANTOPRAZOLE SODIUM 40 MG PO TBEC
40.0000 mg | DELAYED_RELEASE_TABLET | Freq: Every day | ORAL | Status: DC
  Administered 2021-09-14 – 2021-09-22 (×9): 40 mg via ORAL
  Filled 2021-09-13 (×9): qty 1

## 2021-09-13 MED ORDER — GADOBUTROL 1 MMOL/ML IV SOLN
7.0000 mL | Freq: Once | INTRAVENOUS | Status: AC | PRN
  Administered 2021-09-13: 7 mL via INTRAVENOUS

## 2021-09-13 MED ORDER — ACETAMINOPHEN 650 MG RE SUPP: 650.0000 mg | Freq: Four times a day (QID) | RECTAL | Status: DC | PRN

## 2021-09-13 MED ORDER — ACETAMINOPHEN 325 MG PO TABS
650.0000 mg | ORAL_TABLET | Freq: Four times a day (QID) | ORAL | Status: DC | PRN
  Administered 2021-09-16 – 2021-09-22 (×3): 650 mg via ORAL
  Filled 2021-09-13 (×3): qty 2

## 2021-09-13 MED ORDER — ROSUVASTATIN CALCIUM 10 MG PO TABS
10.0000 mg | ORAL_TABLET | Freq: Every evening | ORAL | Status: DC
  Administered 2021-09-14 – 2021-09-22 (×9): 10 mg via ORAL
  Filled 2021-09-13 (×9): qty 1

## 2021-09-13 MED ORDER — MEGESTROL ACETATE 625 MG/5ML PO SUSP: 625.0000 mg | Freq: Every day | ORAL | Status: DC

## 2021-09-13 NOTE — ED Triage Notes (Signed)
Pt arrives via ems from home. Pt is currently receiving therapy for cancer. Pt reports increased weakness and fatigue.

## 2021-09-13 NOTE — ED Notes (Signed)
Pt has a urinal at bedside

## 2021-09-13 NOTE — ED Notes (Signed)
Pt NAD in bed, a/ox4. Pt c/o feeling weak and tired more than usual recently. Denies CP, SOB, ABD pain, n/v/d or recent illness. Pt has large hernia visible.

## 2021-09-13 NOTE — ED Notes (Signed)
Radiology at bedside

## 2021-09-13 NOTE — ED Notes (Signed)
Pt return from MRI

## 2021-09-13 NOTE — H&P (Signed)
History and Physical    PLEASE NOTE THAT DRAGON DICTATION SOFTWARE WAS USED IN THE CONSTRUCTION OF THIS NOTE.   Gregory Hodges RXV:[id number] DOB: 11-21-1940 DOA: 09/13/2021  PCP: [name], MD  Patient coming from: home   I have personally briefly reviewed patient's old medical records in [place]  Chief Complaint: Generalized weakness  HPI: Gregory Hodges is a 80 y.o. 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   The following history is provided via my discussions with the patient as well as my discussions with the patient's daughter, is present at bedside, in addition to my discussions with the EDP and via chart review.   The patient reports 1 to 2 weeks of progressive generalized weakness in the absence of any acute focal weakness.  The setting of this generalized, symmetrical weakness, the patient reports progressive difficulty with independent completion of ADLs as well as progressive difficulty with ambulation, resulting in 1-2 mechanical falls over the last 2 days, in which he tripped while ambulating with a cane at home.  Per today's, mechanical fall, he struck the floor with his left hip his principal point of contact, noting ensuing development of sharp left-sided nonradiating left hip discomfort.  He is able to bear weight on the left lower extremity, but notes exacerbation of left hip discomfort with ambulation.  These falls, were not associated with any loss of consciousness, and the patient has not hit his head as a component of either fall, which were witnessed, with Dr. Alcide Goodness no associated LOC.  He is on a daily baby aspirin, but no additional blood thinners at home. a  home health physical therapy consult was placed yesterday by his outpatient provider, along with an order for four-wheel walker, although this hardware has not yet been delivered.  Rather, the patient has been using his cane in the meantime.   He has a history of metastatic small cell carcinoma of the right lung, with mets to the brain as well as to the adrenal gland status post palliative radiation, including whole brain radiation as well as radiation into adrenal gland.  He is also undergoing chemotherapy via Imfinzi, with most recent infusion occurring on 09/09/2021.  He follows with Dr. Julien Nordmann as his outpatient oncologist, and is scheduled next chemotherapy infusion on 10/08/2021.   Denies any recent nausea, vomiting, diarrhea, but notes overall decline appetite over the last several weeks in spite of interval initiation of Megace.  He reports that he has lost approximately 50 pounds since initial diagnosis of small cell lung cancer.   Denies any subjective fever, chills, rigors, or generalized myalgias. Denies any recent headache, neck stiffness, rhinitis, rhinorrhea, sore throat, sob, wheezing, cough, abdominal pain.  No recent dysuria, gross hematuria, change in urinary urgency/frequency.  Also denies any recent chest pain, shortness of breath, palpitations, diaphoresis, presyncope, or syncope.  He underwent palliative care consult yesterday, as ordered by his outpatient oncologist, for assistance with symptomatic management as well as clarification of goals of care.  At this time, he remains full code.   Per chart review, he has a history of chronic anemia, with baseline hemoglobin range of 10-13.  Additionally, per chart review, it appears that he is on long-term Bactrim therapy.  ED Course:  Vital signs in the ED were notable for the following:  -Afebrile; heart rate 55-70; blood pressure one 104/62 -116/63; respiratory rate 16-20, O2 sat 95 to 100% on room air.  Labs were notable for  the following: CMP notable for the following: Sodium 140, chloride 115, bicarbonate 18 creatinine 1.419 relative to 1.58 on 09/09/2021, BUN and creatinine ratio 25.5, and liver enzymes within normal limits.  CBC notable for hemoglobin 11.0.  11.9 on 09/09/21, with presenting hemoglobin associated with acidic/normochromic findings, with presenting platelet count 100, compared to 69 on 09/09/21.  Urinalysis ordered, result currently pending.  Lactic acid 1.1.  COVID/influenza PCR were checked in the ED today, with results currently pending.   Imaging and additional notable ED work-up: EKG, in comparison to most recent prior from 09/01/2021 showed sinus rhythm with heart rate 66, normal intervals, nonspecific T wave inversion in aVL, unchanged from most recent prior, and no evidence of ST changes, including no evidence of ST elevation.  Chest x-ray showed no evidence of acute cardiopulmonary process.  Plain films of the left hip/pelvis showed no evidence of acute fracture or dislocation.  MRI of the brain with and without contrast, in comparison to MRI brain from August 2022 was notable for the following: Previously seen enhancing lesions in the right cerebellar hemisphere, right frontal lobe, and right medial temporal lobe are no longer identified, and no evidence of active intracranial metastatic disease;today's MRI brain showed no evidence of acute intracranial process, including no evidence of infarct.  EDP discussed patient's case with on-call oncologist, Dr. Lorenso Courier, who recommends admission to the hospitalist service for further evaluation management of presenting generalized weakness, with plan for Dr. Lorenso Courier to formally consult in the hospital tomorrow morning.   While in the ED, the following were administered: Normal saline x1 L bolus followed by continuous NS at 125 cc/h.     Review of Systems: As per HPI otherwise 10 point review of systems negative.   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   s/p Horizon study stent D1   Peripheral vascular disease ([place]) June 2011   SCLC (small cell lung carcinoma) ([place]) dx'd 10/2020   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 He has a 30.00 pack-year smoking history. He has never used smokeless tobacco. He reports that he does not currently use  alcohol. He reports that he does not use drugs.   Allergies  Allergen Reactions   Oxycodone Nausea And Vomiting    Pt states he can not take any narcotic pain pills   Codeine Nausea And Vomiting    All Narcotics   Lisinopril     Throat swell up   Oxycodone Hcl Nausea And Vomiting    Family history reviewed and not pertinent    Prior to Admission medications   Medication Sig Start Date End Date Taking? Authorizing Provider  acetaminophen (TYLENOL) 500 MG tablet Take 500 mg by mouth every 6 (six) hours as needed for moderate pain.   Yes [provider]   aspirin 81 MG tablet Take 81 mg by mouth at bedtime.   Yes [provider]  atenolol (TENORMIN) 25 MG tablet Take 25 mg by mouth 2 (two) times daily.   Yes [provider]  calcium carbonate (TUMS - DOSED IN MG ELEMENTAL CALCIUM) 500 MG chewable tablet Chew 2 tablets by mouth daily as needed for indigestion or heartburn.   Yes [provider]  diphenhydrAMINE (BENADRYL) 25 MG tablet Take 50 mg by mouth daily as needed for allergies.   Yes [provider]  ferrous sulfate 325 (65 FE) MG tablet Take 325 mg by mouth every Monday, Wednesday, and Friday.   Yes [provider]  lidocaine-prilocaine (EMLA) cream Apply to the Port-A-Cath site 30-60 minutes before treatment 12/19/20  Yes Curt Bears, MD  megestrol (MEGACE ES) 625 MG/5ML suspension Take 5 mLs (625 mg total) by mouth daily. 08/29/21  Yes Isla Pence, MD  nitroGLYCERIN (NITROSTAT) 0.4 MG SL tablet Place 0.4 mg under the tongue every 5 (five) minutes as needed for chest pain.   Yes [provider]  omeprazole (PRILOSEC) 10 MG capsule Take 10 mg by mouth daily. 09/30/20  Yes [provider]  ondansetron (ZOFRAN) 8 MG tablet Take 1 tablet (8 mg total) by mouth every 8 (eight) hours as needed for nausea or vomiting. 05/29/21  Yes Hayden Pedro, PA-C  oxymetazoline (AFRIN) 0.05 % nasal spray Place 1 spray into both nostrils 2 (two) times daily as needed for congestion.   Yes [provider]  rosuvastatin (CRESTOR) 10 MG tablet Take 10 mg by mouth every evening.   Yes [provider]  sulfamethoxazole-trimethoprim (BACTRIM DS) 800-160 MG tablet Take 1 tablet by mouth 2 (two) times daily. 08/17/21  Yes [provider]  prochlorperazine (COMPAZINE) 10 MG tablet Take 1 tablet (10 mg total) by mouth every 6 (six) hours as needed for nausea or vomiting. Patient not taking: Reported on 09/13/2021 12/19/20   Curt Bears, MD     Objective     Physical Exam: Vitals:   09/13/21 1730 09/13/21 1900 09/13/21 1930 09/13/21 2000  BP: (!) 107/54 (!) 146/119 (!) 143/129 106/63  Pulse: 64 69 60 66  Resp: 17 20 18 16   Temp:      TempSrc:      SpO2: 100% 98% 99% 95%  Weight:      Height:        General: appears to be stated age; alert, oriented Skin: warm, dry, no rash Head:  AT/[place] Mouth:  Oral mucosa membranes appear dry, normal dentition Neck: supple; trachea midline Heart:  RRR; did not appreciate any M/R/G Lungs: CTAB, did not appreciate any wheezes, rales, or rhonchi Abdomen: + BS; soft, ND, NT Vascular: 2+ pedal pulses b/l; 2+ radial pulses b/l Extremities: no peripheral edema, no muscle wasting  Neuro: strength and sensation intact in upper and lower extremities b/l     Labs on Admission: I have personally reviewed following labs and imaging studies  CBC: Recent Labs  Lab 09/09/21 1127 09/13/21 1447  WBC 4.1 4.5  NEUTROABS 3.4 3.7  HGB 11.9* 11.0*  HCT 35.6* 34.2*  MCV 93.4 96.1  PLT 69* 774*   Basic Metabolic Panel: Recent Labs  Lab 09/09/21 1127 09/13/21 1447  NA 140 140  K 4.9 4.6  CL 115* 115*  CO2 17* 18*  GLUCOSE 81 90  BUN 42* 36*  CREATININE 1.58* 1.41*  CALCIUM 8.7* 8.8*   GFR: Estimated Creatinine Clearance: 39.9 mL/min (A) (by C-G formula based on SCr of 1.41 mg/dL (H)). Liver Function Tests: Recent Labs  Lab 09/09/21 1127 09/13/21 1447  AST 9* 11*  ALT 6 9  ALKPHOS 53 46  BILITOT 0.6 0.5  PROT 6.0* 5.7*  ALBUMIN 3.7 3.5   No results for input(s): LIPASE, AMYLASE in the last 168 hours. No results for input(s): AMMONIA in the last 168 hours. Coagulation Profile: No results for input(s): INR, PROTIME in the last 168 hours. Cardiac Enzymes: No results for input(s): CKTOTAL, CKMB, CKMBINDEX, TROPONINI in the last 168 hours. BNP (last 3 results) No results for input(s): PROBNP in the last 8760 hours. HbA1C: No results for input(s): HGBA1C in the last 72 hours. CBG: No  results for input(s): GLUCAP in the last 168 hours. Lipid Profile: No results for input(s): CHOL, HDL, LDLCALC, TRIG, CHOLHDL, LDLDIRECT in the last 72 hours. Thyroid Function Tests: No results for input(s): TSH, T4TOTAL, FREET4, T3FREE, THYROIDAB in the last 72 hours. Anemia Panel: No results for input(s): VITAMINB12, FOLATE, FERRITIN, TIBC, IRON, RETICCTPCT in the last 72 hours. Urine analysis:    Component Value Date/Time   COLORURINE YELLOW 09/09/2021 1127   APPEARANCEUR HAZY (A) 09/09/2021 1127   LABSPEC 1.021 09/09/2021 1127   PHURINE 5.0 09/09/2021 1127   GLUCOSEU NEGATIVE 09/09/2021 1127   HGBUR MODERATE (A) 09/09/2021 1127   BILIRUBINUR NEGATIVE 09/09/2021 1127   KETONESUR NEGATIVE 09/09/2021 1127   PROTEINUR NEGATIVE 09/09/2021 1127   UROBILINOGEN 0.2 07/22/2010 1113   NITRITE NEGATIVE 09/09/2021 1127   LEUKOCYTESUR NEGATIVE 09/09/2021 1127    Radiological Exams on Admission: DG Pelvis 1-2 Views  Result Date: 09/13/2021 CLINICAL DATA:  Pain after fall. EXAM: PELVIS - 1-2 VIEW COMPARISON:  None. FINDINGS: There is no evidence of pelvic fracture or diastasis. No pelvic bone lesions are seen. IMPRESSION: Negative. Electronically Signed   By: Dorise Bullion III M.D.   On: 09/13/2021 17:56   MR Brain W and Wo Contrast  Result Date: 09/13/2021 CLINICAL DATA:  Weakness and fatigue, history of lung cancer EXAM: MRI HEAD WITHOUT AND WITH CONTRAST TECHNIQUE: Multiplanar, multiecho pulse sequences of the brain and surrounding structures were obtained without and with intravenous contrast. CONTRAST:  77m GADAVIST GADOBUTROL 1 MMOL/ML IV SOLN COMPARISON:  CT head 09/09/2021, MR head 05/16/2021 FINDINGS: Brain: There is no evidence of acute intracranial hemorrhage, extra-axial fluid collection, or acute infarct. There is mild-to-moderate global parenchymal volume loss. Patchy FLAIR signal abnormality throughout the subcortical and periventricular white matter likely reflects sequela of  moderate chronic white matter microangiopathy. Foci of SWI signal dropout in the left frontal lobe anteriorly and left temporal lobe laterally are unchanged consistent with old blood products probably related to prior metastatic disease. The previously seen enhancing lesion in the right cerebellar hemisphere is no longer identified. SWI signal dropout is seen in  this location consistent with old blood products related to the previously metastatic lesion. The previously seen enhancing lesion in the medial right temporal lobe is also no longer identified. There was likely a lesion n in the medial right frontal lobe on the prior study which is also no longer present. There is no evidence of active intracranial metastatic disease on the current study. Vascular: Normal flow voids. Skull and upper cervical spine: Normal marrow signal. Sinuses/Orbits: The paranasal sinuses are clear. The globes and orbits are unremarkable. Other: None. IMPRESSION: 1. Previously seen enhancing lesions in the right cerebellar hemisphere, right frontal lobe, and right medial temporal lobe are no longer identified. No evidence of active intracranial metastatic disease on the current study. 2. No other acute intracranial pathology. 3. Unchanged global parenchymal volume loss and moderate chronic white matter microangiopathy. Electronically Signed   By: Valetta Mole M.D.   On: 09/13/2021 19:04   DG Chest Port 1 View  Result Date: 09/13/2021 CLINICAL DATA:  Altered level of consciousness. EXAM: PORTABLE CHEST 1 VIEW COMPARISON:  Chest x-ray 08/29/2021. FINDINGS: Right chest port catheter tip projects over the mid SVC, unchanged. The cardiomediastinal silhouette is stable and within normal limits. Large hiatal hernia is again noted. The lungs are clear. There is no pleural effusion or pneumothorax. No acute fractures are seen. IMPRESSION: 1. No active disease. 2. Stable large hiatal hernia. Electronically Signed   By: Ronney Asters M.D.   On:  09/13/2021 15:17   DG Hip Unilat W or Wo Pelvis 2-3 Views Left  Result Date: 09/13/2021 CLINICAL DATA:  Pain with weight-bearing. EXAM: DG HIP (WITH OR WITHOUT PELVIS) 2-3V LEFT COMPARISON:  None. FINDINGS: There is no evidence of hip fracture or dislocation. Mild degenerative changes are noted in the form of acetabular sclerosis. Marked severity vascular calcification is seen. IMPRESSION: 1. No acute fracture or dislocation. 2. Mild degenerative changes. Electronically Signed   By: Virgina Norfolk M.D.   On: 09/13/2021 19:48     EKG: Independently reviewed, with result as described above.    Assessment/Plan    Principal Problem:   Generalized weakness Active Problems:   Small cell lung cancer, right ([place])   Fall at home, initial encounter   Metabolic acidosis, normal anion gap (NAG)   Protein calorie malnutrition (HCC)   Dehydration   Acute prerenal azotemia   Hyperlipemia   Hypertension     #) Generalized weakness: 1-2 duration of progressive generalized weakness, in the absence of any evidence of acute focal neurologic deficits, including no evidence of acute focal weakness.  MRI brain performed today showed no evidence of acute intracranial process, including no evidence of acute ischemic infarct will also showing evidence of interval improvement in previously noted metastatic disease, without any new evidence of metastatic process, and overall demonstrated no evidence of acute process.   Suspect that patient's progressive generalized weakness is multifactorial in nature, with contributions from physiologic stress of underlying metastatic disease as well as consequence of undergoing chemotherapy, most recent 4 days ago.  Likely also contributions from relative dehydration as well as mild protein calorie malnutrition, with potential room for optimization of volume status as well as nutritional status, as further detailed below.  No evidence of underlying infectious process at this  time, although urinalysis, COVID-19/influenza PCR results are currently pending.   Case was discussed w/ on-call oncologist, Dr. Lorenso Courier, who recommends admission to the hospitalist service for further evaluation management of presenting generalized weakness, with plan for Dr. Lorenso Courier to formally  consult in the hospital tomorrow morning.      Plan: PT/OT consults placed in the morning.  Fall precautions.  Follow-up result urinalysis, COVID/influenza PCR screen.  Check TSH, MMA.  CMP/CBC in the morning.  Check prealbumin.  Also placed the dietary consult order for assistance with optimization of nutritional status.  LR at 75 cc/h x 10 hours.  In setting of long smoking history, will check VBG. Cpk. Oncology consulted, as above.        #) Ground level mechanical fall: Ground-level falls x2 last few days, which appear purely mechanical in nature, with the patient tripping while ambulating with cane at home without any associated loss of consciousness.  Not associate with any evidence of presyncope, syncope, or chest pain.  Did not hit head as a component of this fall.  No evidence of acute fracture, including via plain film evaluation of/pelvis in the setting of mild residual left hip discomfort.  MRI brain showed no evidence of acute intracranial process, as above.  Appears to be on the basis of his progressive generalized weakness, as above.  Of note, he is on a daily baby aspirin at home.  Plan: Further evaluation/management of presenting generalized weakness, as above, including UA, covid/influenza screen.  Precautions.  PT/OT consults.  CMP/CBC in the morning. MMA level.  Prn acetaminophen for residual left hip discomfort.      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  Of note, presenting lactate nonelevated.   Plan: Gentle continuous IV fluids overnight, will  change from NS to LR in the setting of hyperchloremia.  Repeat CMP in the morning.  VBG, CPK.  Monitor strict I's and O's Daily weights.        #) Protein calorie malnutrition: Reports that initial unintentional weight loss of 50 pounds in the weeks following initial diagnosis of small cell lung cancer, attempts to gain weight inhibited by ensuing decline in appetite in spite of good compliance with recently initiated on Megace.  Most recent documented BMI 20.9.  Control contribution towards presenting generalized weakness from suboptimal nutritional status.  will consult dietitian for further recommendations.   Plan: Check prealbumin level.  I have placed consult with dietitian for assistance with recommendations for nutritional optimization, including recommendations regarding potential supplements for this purpose.  Daily weights.        #) Dehydration: Clinical suspicion for such, including the appearance of dry oral mucous membranes as well as laboratory findings notable for acute prerenal azotemia. Appears to be in the setting of   dehydration stemming from decline in oral intake, including that of consumption of water.  No e/o associated hypotension.   Plan: Monitor strict I's and O's.  Daily weights.  Repeat BMP in the morning. IVF's, as above.  UA.         #) Essential Hypertension: documented h/o such, with outpatient antihypertensive regimen consisting of atenolol .  SBP's in the ED today:  100's to 120's mmHg. in the setting of borderline hypotension and borderline bradycardia, will hold home atenolol for now.  Plan: Close monitoring of subsequent BP via routine VS. hold atenolol for now.  Monitor on telemetry.       #) Hyperlipidemia: documented h/o such. On rosuvastatin as outpatient.   Plan: continue home statin.        #) GERD: documented h/o such; on omeprazole as outpatient.   Plan: continue  home PPI.         #) Chronic anemia: Documented  history of such, with baseline hemoglobin range noted to be 10-13, with presenting hemoglobin consistent with his baseline, in the absence of any evidence to suggest active bleed.  Suspected to be on the basis of anemia of chronic disease, with presenting normocytic/normochromic findings consistent with this.   Plan: Repeat CBC in the morning.  Check INR.  Check MMA as a component of generalized weakness evaluation.      #) Tobacco abuse: The patient confirms that he is a current smoker, smoking per day greater than 30 years.  He confirms  that continues to smoke only diagnosis of small cell lung cancer.  Plan: Prn nicotine patch has been ordered for use during his hospitalization. Counseled pt on importance of smoking discontinuation.      DVT prophylaxis: SCD's   Code Status: Full code Family Communication: Patient's case was discussed with his daughter, who is present at bedside Disposition Plan: Per Rounding Team Consults called: EDP discussed patient's case with on-call oncologist, Dr. Lorenso Courier, who will formally consult, as further detailed above;  Admission status: obs   PLEASE NOTE THAT DRAGON DICTATION SOFTWARE WAS USED IN THE CONSTRUCTION OF THIS NOTE.   Edmond DO [facility] [place]   09/13/2021, 8:37 PM

## 2021-09-13 NOTE — ED Provider Notes (Addendum)
[facility] DEPT Provider Note   CSN: [id number] Arrival date & time: 09/13/21  1436     History Chief Complaint  Patient presents with   Weakness   Fatigue    Gregory Hodges is a 80 y.o. male.  HPI Patient with a history of metastatic small cell lung cancer presents with fatigue. Is here with his wife who assists with the history.  Patient is in the midst of ongoing therapeutic efforts, he is status post brain radiation therapy, and over the past days, possibly longer has had worsening generalized weakness and frequent falls.  However, the patient last fall was not within the past 24 hours and he was seen and evaluated following that.  Regardless, the patient presents with worsening generalized weakness, gait difficulty, and pain largely in his coccyx and hips.  Pain is sore, persistent, worse with ambulation or weightbearing.  No new syncope, confusion.  No vomiting.  Wife notes the patient is not eating or drinking typical amounts.  Chart review notable for palliative care visit at home yesterday and oncology office note from last month included below:  DIAGNOSIS: extensive stage (T2 a, N2, M1c) small cell lung cancer presented with right hilar mass in addition to mediastinal lymphadenopathy as well as metastatic disease to the bone with complete replacement of the L5 status post laminectomy with resection of epidural tumor as well as metastatic disease to the brain, bones and bilateral adrenal glands diagnosed in January 2022.    PRIOR THERAPY:  1) Status post palliative radiotherapy to the resected area at L5 under the care of Dr. Lisbeth Renshaw. 2) whole brain irradiation under the care of Dr. Lisbeth Renshaw. 3) palliative radiotherapy to the enlarging bilateral adrenal gland metastasis under the care of Dr. Lisbeth Renshaw last fraction June 20, 2021.   CURRENT THERAPY: Systemic chemotherapy with carboplatin for AUC of 5 from day 1, etoposide 100 mg/M2 on days 1, 2 and 3  with Cosela 240 mg/M2 before chemotherapy in addition to Imfinzi 1500 mg IV every 3 weeks with day one of the chemotherapy.  First dose December 24, 2020.  Status post 9  cycles.  Starting from cycle #5 the patient will be on maintenance treatment with Imfinzi every 4 weeks.   INTERVAL HISTORY: Gregory Hodges 80 y.o. male returns to the clinic today for follow-up visit.  The patient is feeling fine today with no concerning complaints except for fatigue.  He completed the course of radiotherapy under the care of Dr. Lisbeth Renshaw.  He denied having any current chest pain, shortness of breath, cough or hemoptysis.  He denied having any fever or chills.  He has no nausea, vomiting, diarrhea or constipation.  He has no headache or visual changes.  The patient denied having any significant weight loss or night sweats.  He had repeat CT scan of the chest, abdomen pelvis performed recently and he is here for evaluation and discussion of his scan results.  Gregory Hodges is a 80 y.o. 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  Palliative ask to see for symptom management and goals of care.       Past Medical History:  Diagnosis Date   AAA (abdominal aortic aneurysm)    s/p open repair using aortobifemoral graft 03/03/10 ([facility]-), complicated by wound dehisence, enterocutaneous fistula; developed aortic graft infection s/p explant of graft  and placement of bilateral axillofemoral grafts 07/22/10 [facility])   Cancer ([place])    Skin   Crohn's disease ([place])    E coli bacteremia    History of kidney stones    Hyperlipemia    Hypertension    "denies"   Myocardial infarction ([place]) 08/11/2005   s/p Horizon study stent D1   Peripheral vascular disease ([place]) June 2011   SCLC (small cell lung carcinoma) ([place]) dx'd  10/2020   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    Patient Active Problem List   Diagnosis Date Noted   Generalized weakness 09/13/2021   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   Brain metastases ([place]) 03/11/2021   Difficulty urinating 01/22/2021   Hypotension 01/22/2021   Port-A-Cath in place 12/31/2020   Small cell lung cancer, right ([place]) 12/03/2020   Encounter for antineoplastic chemotherapy 12/03/2020   Encounter for antineoplastic immunotherapy 12/03/2020   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   E coli bacteremia    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[place])    Peripheral vascular disease, unspecified ([place]) 10/09/2013   Yeast infection 01/26/2013   Atherosclerosis of native arteries of the extremities with intermittent claudication 03/21/2012   Wound drainage 01/13/2012   Hematuria 01/09/2011   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 08/20/2010   ACUTE AND SUBACUTE BACTERIAL ENDOCARDITIS 08/20/2010   AAA 08/20/2010   INF&INFLAM REACT DUE UNSPEC DEVICE IMPLANT&GRAFT 08/20/2010    Past Surgical History:  Procedure Laterality Date   ABDOMINAL AORTIC ANEURYSM REPAIR  03/03/2010   AXILLARY-FEMORAL BYPASS GRAFT  07/22/2010   bilateral   bowel     herniated bowel   CARDIAC CATHETERIZATION     CORONARY STENT PLACEMENT     CYSTOSCOPY     IR IMAGING GUIDED PORT INSERTION  12/27/2020   LAMINECTOMY N/A 10/22/2020   Procedure: Lumbar five Open Laminectomy for tumor resection;  Surgeon: Judith Part, MD;  Location: [facility];  Service: Neurosurgery;  Laterality: N/A;   MOHS SURGERY     several        History reviewed. No pertinent family history.  Social History   Tobacco Use   Smoking status: Every Day    Packs/day: 1.00    Years: 30.00    Pack years: 30.00    Types: Cigarettes   Smokeless tobacco: Never  Vaping Use   Vaping Use: Never used  Substance Use Topics   Alcohol use: Not Currently    Comment: special ocassional   Drug use: No    Home Medications Prior to  Admission medications   Medication Sig Start Date End Date Taking? Authorizing Provider  acetaminophen (TYLENOL) 500 MG tablet Take 500 mg by mouth every 6 (six) hours as needed for moderate pain.   Yes [provider]  aspirin 81 MG tablet Take 81 mg by mouth at bedtime.   Yes [provider]  atenolol (TENORMIN) 25 MG tablet Take 25 mg by mouth 2 (two) times daily.   Yes [provider]  calcium carbonate (TUMS - DOSED IN MG ELEMENTAL CALCIUM) 500 MG chewable tablet Chew 2 tablets by mouth daily as needed for indigestion or heartburn.   Yes [provider]  diphenhydrAMINE (BENADRYL) 25 MG tablet Take 50 mg by mouth daily as needed for allergies.   Yes [provider]  ferrous sulfate 325 (65 FE) MG tablet Take 325 mg by mouth every Monday, Wednesday, and Friday.   Yes [provider]  lidocaine-prilocaine (EMLA) cream Apply to the Port-A-Cath site 30-60 minutes before treatment  12/19/20  Yes Curt Bears, MD  megestrol (MEGACE ES) 625 MG/5ML suspension Take 5 mLs (625 mg total) by mouth daily. 08/29/21  Yes Isla Pence, MD  nitroGLYCERIN (NITROSTAT) 0.4 MG SL tablet Place 0.4 mg under the tongue every 5 (five) minutes as needed for chest pain.   Yes [provider]  omeprazole (PRILOSEC) 10 MG capsule Take 10 mg by mouth daily. 09/30/20  Yes [provider]  ondansetron (ZOFRAN) 8 MG tablet Take 1 tablet (8 mg total) by mouth every 8 (eight) hours as needed for nausea or vomiting. 05/29/21  Yes Hayden Pedro, PA-C  oxymetazoline (AFRIN) 0.05 % nasal spray Place 1 spray into both nostrils 2 (two) times daily as needed for congestion.   Yes [provider]  rosuvastatin (CRESTOR) 10 MG tablet Take 10 mg by mouth every evening.   Yes [provider]  sulfamethoxazole-trimethoprim (BACTRIM DS) 800-160 MG tablet Take 1 tablet by mouth 2 (two) times daily. 08/17/21  Yes [provider]   prochlorperazine (COMPAZINE) 10 MG tablet Take 1 tablet (10 mg total) by mouth every 6 (six) hours as needed for nausea or vomiting. Patient not taking: Reported on 09/13/2021 12/19/20   Curt Bears, MD    Allergies    Oxycodone, Codeine, Lisinopril, and Oxycodone hcl  Review of Systems   Review of Systems  Physical Exam Updated Vital Signs BP 106/63    Pulse 66    Temp 97.8 F (36.6 C) (Oral)    Resp 16    Ht 5' 9.5" (1.765 m)    Wt 65.3 kg    SpO2 95%    BMI 20.96 kg/m   Physical Exam Vitals and nursing note reviewed.  Constitutional:      Appearance: He is well-developed.     Comments: Ill appearing  HENT:     Head: Normocephalic and atraumatic.  Eyes:     Conjunctiva/sclera: Conjunctivae normal.  Cardiovascular:     Rate and Rhythm: Normal rate and regular rhythm.  Pulmonary:     Effort: Pulmonary effort is normal. No respiratory distress.     Breath sounds: No stridor.  Abdominal:     General: There is no distension.  Musculoskeletal:     Comments: Pelvis stable, factors, mild tenderness to palpation but he is able to flex each hip independently, moves all extremities spontaneously and to command.  Skin:    General: Skin is warm and dry.  Neurological:     Mental Status: He is alert and oriented to person, place, and time.  Psychiatric:        Mood and Affect: Mood normal.    ED Results / Procedures / Treatments   Labs (all labs ordered are listed, but only abnormal results are displayed) Labs Reviewed  COMPREHENSIVE METABOLIC PANEL - Abnormal; Notable for the following components:      Result Value   Chloride 115 (*)    CO2 18 (*)    BUN 36 (*)    Creatinine, Ser 1.41 (*)    Calcium 8.8 (*)    Total Protein 5.7 (*)    AST 11 (*)    GFR, Estimated 51 (*)    All other components within normal limits  CBC WITH DIFFERENTIAL/PLATELET - Abnormal; Notable for the following components:   RBC 3.56 (*)    Hemoglobin 11.0 (*)    HCT 34.2 (*)    RDW 15.8  (*)    Platelets 100 (*)    Lymphs Abs 0.2 (*)  All other components within normal limits  RESP PANEL BY RT-PCR (FLU A&B, COVID) ARPGX2  LACTIC ACID, PLASMA  ETHANOL  URINALYSIS, ROUTINE W REFLEX MICROSCOPIC  MAGNESIUM  MAGNESIUM  COMPREHENSIVE METABOLIC PANEL  CBC WITH DIFFERENTIAL/PLATELET  METHYLMALONIC ACID, SERUM  TSH    EKG EKG Interpretation  Date/Time:  Saturday September 13 2021 15:01:14 EST Ventricular Rate:  66 PR Interval:  191 QRS Duration: 96 QT Interval:  405 QTC Calculation: 425 R Axis:   -37 Text Interpretation: Sinus rhythm Left axis deviation Borderline T abnormalities, lateral leads Artifact Abnormal ECG Confirmed by Carmin Muskrat [phone]) on 09/13/2021 4:44:13 PM  Radiology DG Pelvis 1-2 Views  Result Date: 09/13/2021 CLINICAL DATA:  Pain after fall. EXAM: PELVIS - 1-2 VIEW COMPARISON:  None. FINDINGS: There is no evidence of pelvic fracture or diastasis. No pelvic bone lesions are seen. IMPRESSION: Negative. Electronically Signed   By: Dorise Bullion III M.D.   On: 09/13/2021 17:56   MR Brain W and Wo Contrast  Result Date: 09/13/2021 CLINICAL DATA:  Weakness and fatigue, history of lung cancer EXAM: MRI HEAD WITHOUT AND WITH CONTRAST TECHNIQUE: Multiplanar, multiecho pulse sequences of the brain and surrounding structures were obtained without and with intravenous contrast. CONTRAST:  22m GADAVIST GADOBUTROL 1 MMOL/ML IV SOLN COMPARISON:  CT head 09/09/2021, MR head 05/16/2021 FINDINGS: Brain: There is no evidence of acute intracranial hemorrhage, extra-axial fluid collection, or acute infarct. There is mild-to-moderate global parenchymal volume loss. Patchy FLAIR signal abnormality throughout the subcortical and periventricular white matter likely reflects sequela of moderate chronic white matter microangiopathy. Foci of SWI signal dropout in the left frontal lobe anteriorly and left temporal lobe laterally are unchanged consistent with old blood  products probably related to prior metastatic disease. The previously seen enhancing lesion in the right cerebellar hemisphere is no longer identified. SWI signal dropout is seen in this location consistent with old blood products related to the previously metastatic lesion. The previously seen enhancing lesion in the medial right temporal lobe is also no longer identified. There was likely a lesion n in the medial right frontal lobe on the prior study which is also no longer present. There is no evidence of active intracranial metastatic disease on the current study. Vascular: Normal flow voids. Skull and upper cervical spine: Normal marrow signal. Sinuses/Orbits: The paranasal sinuses are clear. The globes and orbits are unremarkable. Other: None. IMPRESSION: 1. Previously seen enhancing lesions in the right cerebellar hemisphere, right frontal lobe, and right medial temporal lobe are no longer identified. No evidence of active intracranial metastatic disease on the current study. 2. No other acute intracranial pathology. 3. Unchanged global parenchymal volume loss and moderate chronic white matter microangiopathy. Electronically Signed   By: PValetta MoleM.D.   On: 09/13/2021 19:04   DG Chest Port 1 View  Result Date: 09/13/2021 CLINICAL DATA:  Altered level of consciousness. EXAM: PORTABLE CHEST 1 VIEW COMPARISON:  Chest x-ray 08/29/2021. FINDINGS: Right chest port catheter tip projects over the mid SVC, unchanged. The cardiomediastinal silhouette is stable and within normal limits. Large hiatal hernia is again noted. The lungs are clear. There is no pleural effusion or pneumothorax. No acute fractures are seen. IMPRESSION: 1. No active disease. 2. Stable large hiatal hernia. Electronically Signed   By: ARonney AstersM.D.   On: 09/13/2021 15:17   DG Hip Unilat W or Wo Pelvis 2-3 Views Left  Result Date: 09/13/2021 CLINICAL DATA:  Pain with weight-bearing. EXAM: DG HIP (WITH OR WITHOUT  PELVIS) 2-3V LEFT  COMPARISON:  None. FINDINGS: There is no evidence of hip fracture or dislocation. Mild degenerative changes are noted in the form of acetabular sclerosis. Marked severity vascular calcification is seen. IMPRESSION: 1. No acute fracture or dislocation. 2. Mild degenerative changes. Electronically Signed   By: Virgina Norfolk M.D.   On: 09/13/2021 19:48    Procedures Procedures   Medications Ordered in ED Medications  sodium chloride 0.9 % bolus 1,000 mL (0 mLs Intravenous Stopped 09/13/21 1735)    And  0.9 %  sodium chloride infusion ( Intravenous New Bag/Given 09/13/21 1735)  acetaminophen (TYLENOL) tablet 650 mg (has no administration in time range)    Or  acetaminophen (TYLENOL) suppository 650 mg (has no administration in time range)  gadobutrol (GADAVIST) 1 MMOL/ML injection 7 mL (7 mLs Intravenous Contrast Given 09/13/21 1805)    ED Course  I have reviewed the triage vital signs and the nursing notes.  Pertinent labs & imaging results that were available during my care of the patient were reviewed by me and considered in my medical decision making (see chart for details).  I discussed the patient's case with our radiation oncologist Dr. Lorenso Courier.  With concern for patient's gait difficulty, weakness, falls indications for admission.  MRI ordered   8:47 PM On reexam patient continues to complain of general weakness.  I reviewed his imaging myself, reviewed the interpretation as well, as well as labs.  Labs notable for mild ongoing dehydration, imaging results consistent with known metastatic disease, with no evidence for pneumonia, broken bones.  Given concern for worsening weakness, gait difficulty, metastatic disease patient will be admitted for further monitoring, management. MDM Rules/Calculators/A&P MDM Number of Diagnoses or Management Options Weakness: established, worsening   Amount and/or Complexity of Data Reviewed Clinical lab tests: ordered and reviewed Tests in the  radiology section of CPT: ordered and reviewed Tests in the medicine section of CPT: reviewed and ordered Decide to obtain previous medical records or to obtain history from someone other than the patient: yes Obtain history from someone other than the patient: yes Review and summarize past medical records: yes Discuss the patient with other providers: yes Independent visualization of images, tracings, or specimens: yes  Risk of Complications, Morbidity, and/or Mortality Presenting problems: high Diagnostic procedures: high Management options: high  Critical Care Total time providing critical care: < 30 minutes  Patient Progress Patient progress: stable   Final Clinical Impression(s) / ED Diagnoses Final diagnoses:  Weakness     Carmin Muskrat, MD 09/13/21 2052    Carmin Muskrat, MD 10/03/21 [phone]

## 2021-09-14 ENCOUNTER — Observation Stay (HOSPITAL_COMMUNITY): Payer: PPO

## 2021-09-14 DIAGNOSIS — G319 Degenerative disease of nervous system, unspecified: Secondary | ICD-10-CM | POA: Diagnosis not present

## 2021-09-14 DIAGNOSIS — S50812A Abrasion of left forearm, initial encounter: Secondary | ICD-10-CM | POA: Diagnosis not present

## 2021-09-14 DIAGNOSIS — N19 Unspecified kidney failure: Secondary | ICD-10-CM | POA: Diagnosis not present

## 2021-09-14 DIAGNOSIS — E860 Dehydration: Secondary | ICD-10-CM | POA: Diagnosis not present

## 2021-09-14 DIAGNOSIS — R531 Weakness: Secondary | ICD-10-CM | POA: Diagnosis not present

## 2021-09-14 DIAGNOSIS — W19XXXA Unspecified fall, initial encounter: Secondary | ICD-10-CM | POA: Diagnosis not present

## 2021-09-14 DIAGNOSIS — Z043 Encounter for examination and observation following other accident: Secondary | ICD-10-CM | POA: Diagnosis not present

## 2021-09-14 LAB — PREALBUMIN: Prealbumin: 26 mg/dL (ref 18–38)

## 2021-09-14 LAB — COMPREHENSIVE METABOLIC PANEL
ALT: 8 U/L (ref 0–44)
AST: 14 U/L — ABNORMAL LOW (ref 15–41)
Albumin: 3.6 g/dL (ref 3.5–5.0)
Alkaline Phosphatase: 45 U/L (ref 38–126)
Anion gap: 8 (ref 5–15)
BUN: 29 mg/dL — ABNORMAL HIGH (ref 8–23)
CO2: 17 mmol/L — ABNORMAL LOW (ref 22–32)
Calcium: 8.7 mg/dL — ABNORMAL LOW (ref 8.9–10.3)
Chloride: 114 mmol/L — ABNORMAL HIGH (ref 98–111)
Creatinine, Ser: 1.14 mg/dL (ref 0.61–1.24)
GFR, Estimated: >60 mL/min (ref >60)
Glucose, Bld: 89 mg/dL (ref 70–99)
Potassium: 4.6 mmol/L (ref 3.5–5.1)
Sodium: 139 mmol/L (ref 135–145)
Total Bilirubin: 1 mg/dL (ref 0.3–1.2)
Total Protein: 6.1 g/dL — ABNORMAL LOW (ref 6.5–8.1)

## 2021-09-14 LAB — CBC WITH DIFFERENTIAL/PLATELET
Abs Immature Granulocytes: 0.03 10*3/uL (ref 0.00–0.07)
Basophils Absolute: 0 10*3/uL (ref 0.0–0.1)
Basophils Relative: 0 %
Eosinophils Absolute: 0 10*3/uL (ref 0.0–0.5)
Eosinophils Relative: 0 %
HCT: 35.5 % — ABNORMAL LOW (ref 39.0–52.0)
Hemoglobin: 11.6 g/dL — ABNORMAL LOW (ref 13.0–17.0)
Immature Granulocytes: 1 %
Lymphocytes Relative: 6 %
Lymphs Abs: 0.3 10*3/uL — ABNORMAL LOW (ref 0.7–4.0)
MCH: 31.6 pg (ref 26.0–34.0)
MCHC: 32.7 g/dL (ref 30.0–36.0)
MCV: 96.7 fL (ref 80.0–100.0)
Monocytes Absolute: 0.5 10*3/uL (ref 0.1–1.0)
Monocytes Relative: 9 %
Neutro Abs: 4.3 10*3/uL (ref 1.7–7.7)
Neutrophils Relative %: 84 %
Platelets: 111 10*3/uL — ABNORMAL LOW (ref 150–400)
RBC: 3.67 MIL/uL — ABNORMAL LOW (ref 4.22–5.81)
RDW: 15.9 % — ABNORMAL HIGH (ref 11.5–15.5)
WBC: 5.1 10*3/uL (ref 4.0–10.5)
nRBC: 0 % (ref 0.0–0.2)

## 2021-09-14 LAB — BLOOD GAS, VENOUS
Acid-base deficit: 8.2 mmol/L — ABNORMAL HIGH (ref 0.0–2.0)
Bicarbonate: 16.4 mmol/L — ABNORMAL LOW (ref 20.0–28.0)
O2 Saturation: 73.4 %
Patient temperature: 98.6
pCO2, Ven: 32.2 mmHg — ABNORMAL LOW (ref 44.0–60.0)
pH, Ven: 7.328 (ref 7.250–7.430)
pO2, Ven: 44.7 mmHg (ref 32.0–45.0)

## 2021-09-14 LAB — PROTIME-INR
INR: 1.2 (ref 0.8–1.2)
Prothrombin Time: 15.6 seconds — ABNORMAL HIGH (ref 11.4–15.2)

## 2021-09-14 LAB — CK: Total CK: 135 U/L (ref 49–397)

## 2021-09-14 LAB — TSH: TSH: 0.664 u[IU]/mL (ref 0.350–4.500)

## 2021-09-14 LAB — MAGNESIUM: Magnesium: 2 mg/dL (ref 1.7–2.4)

## 2021-09-14 IMAGING — CT CT HEAD W/O CM
3 of 4 series · 15 of 47 positions shown, 18 images · non-contrast
Comparison: [DATE]

CLINICAL DATA: Recent fall, initial encounter

EXAM:
CT HEAD WITHOUT CONTRAST
TECHNIQUE: Contiguous axial images were obtained from the base of the skull
through the vertex without intravenous contrast.

[Series 2: head wo · axial · 0.44mm/px · z∈[+1364,+1484]mm · 9 of 32 slices shown, 12 images]
[im 4/32  brain]
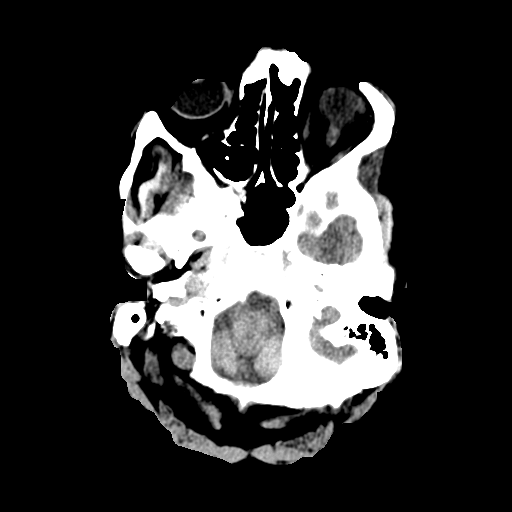
[im 4/32  bone]
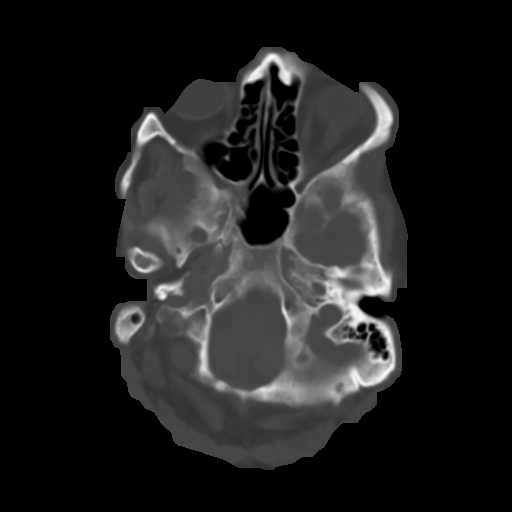
[im 7/32  brain]
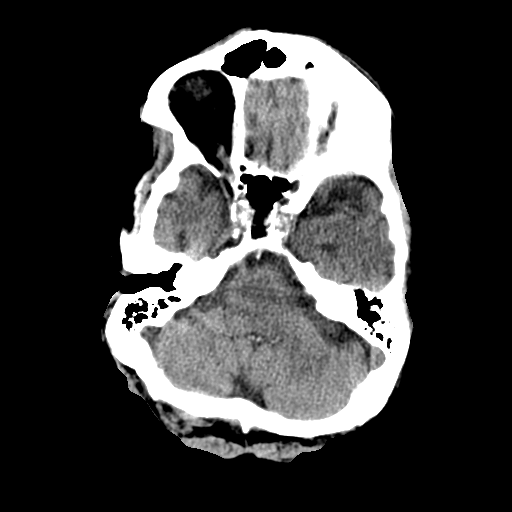
[im 10/32  brain]
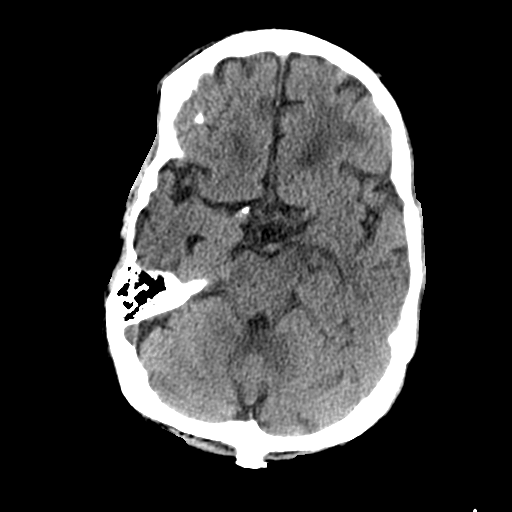
[im 13/32  brain]
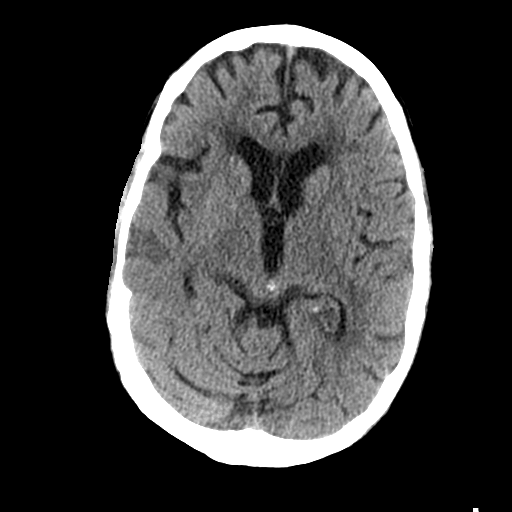
[im 16/32  brain]
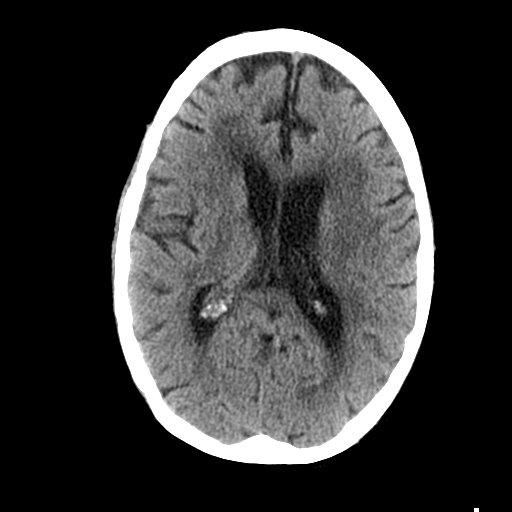
[im 16/32  bone]
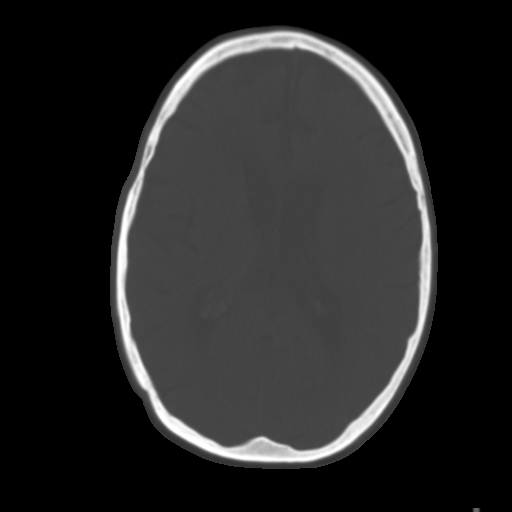
[im 19/32  brain]
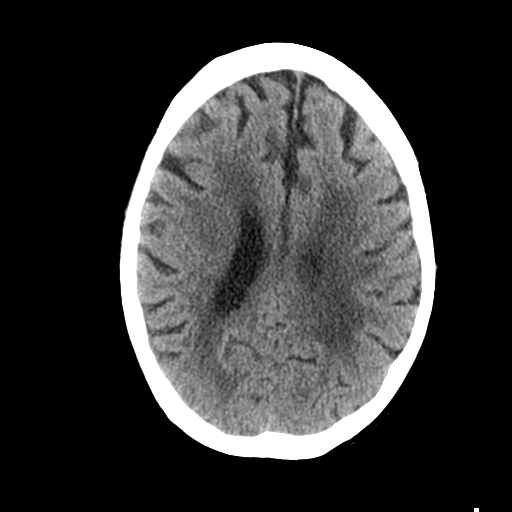
[im 22/32  brain]
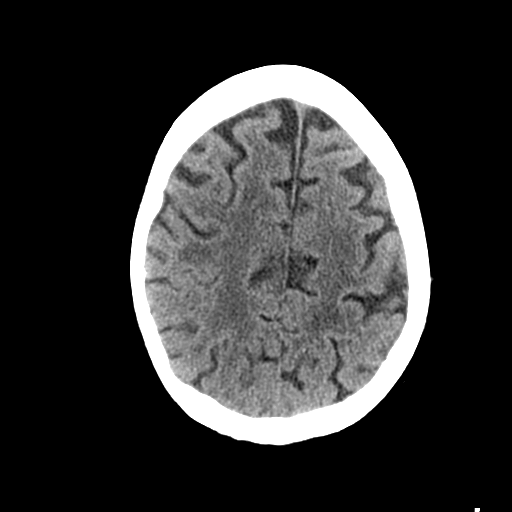
[im 25/32  brain]
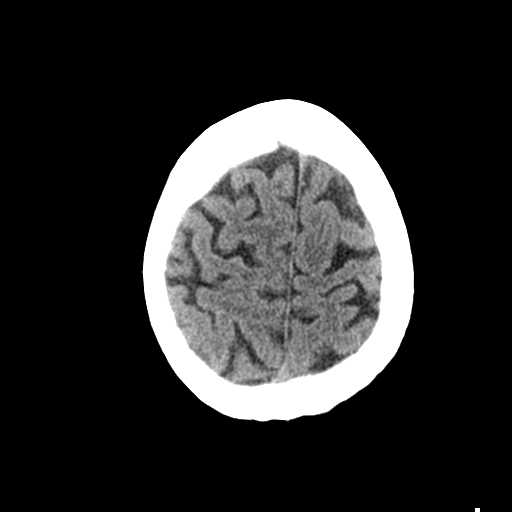
[im 28/32  brain]
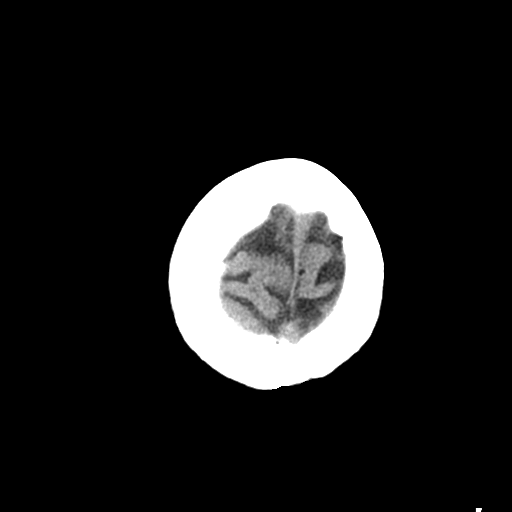
[im 28/32  bone]
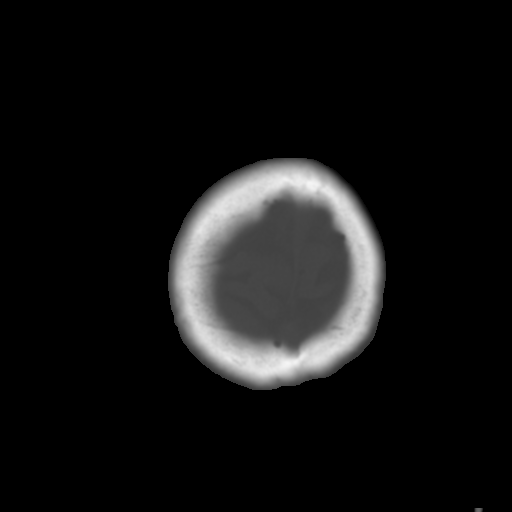

[Series 4: coronal soft tissue · coronal · 0.32mm/px · 3 of 71 slices shown]
[im 24/71  brain]
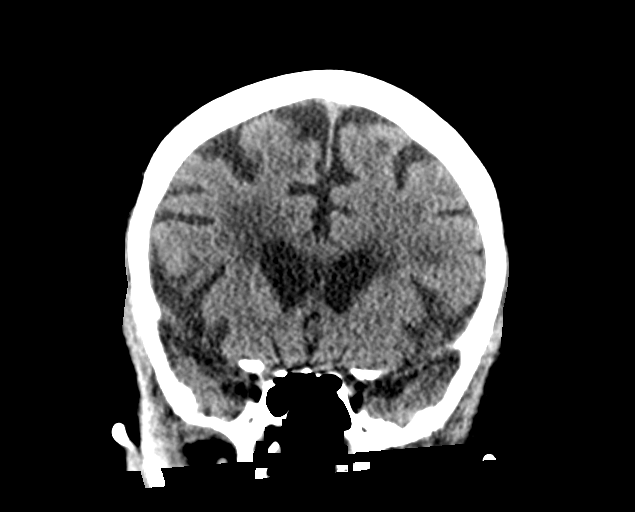
[im 32/71  brain]
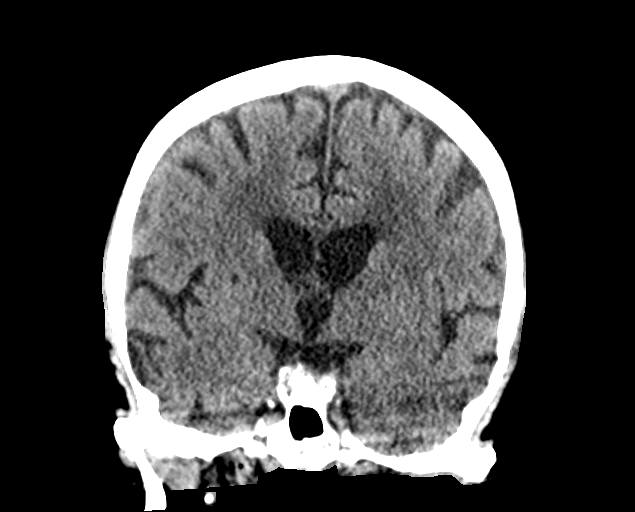
[im 39/71  brain]
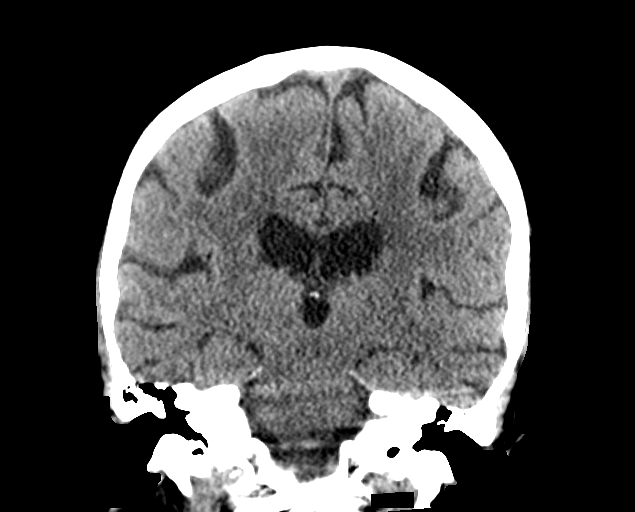

[Series 5: sagittal soft tissue · sagittal · 0.32mm/px · 3 of 69 slices shown]
[im 23/69  brain]
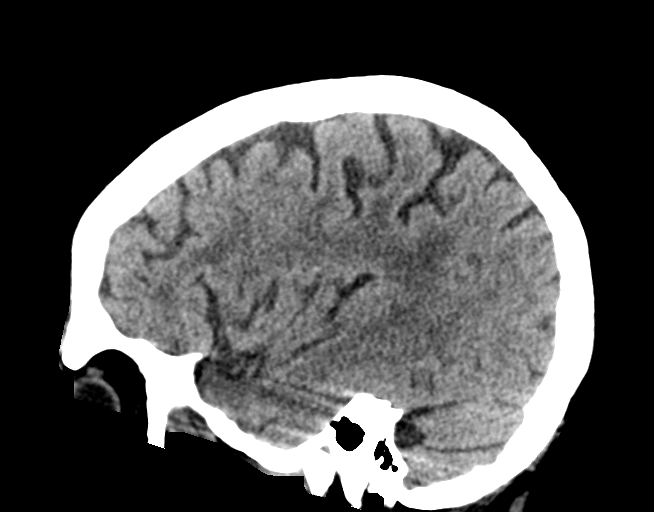
[im 35/69  brain]
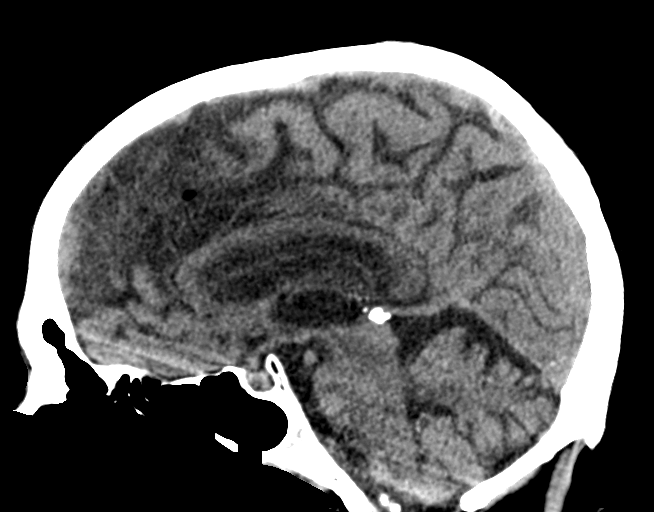
[im 46/69  brain]
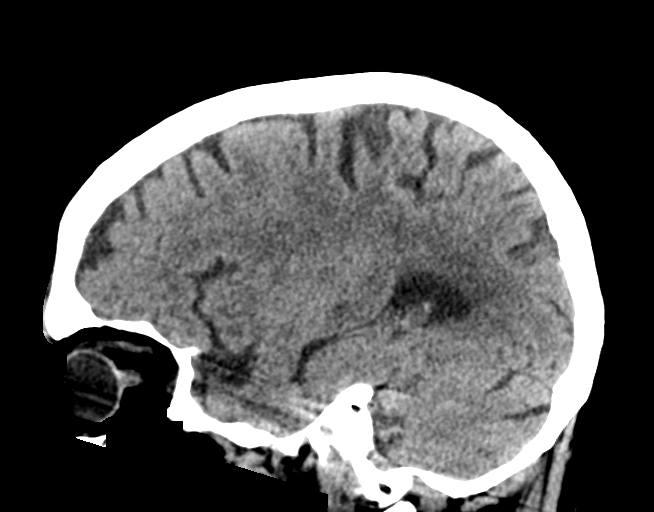

[15 of 47 positions shown; findings below may reference images not displayed]

FINDINGS: Brain: No evidence of acute infarction, hemorrhage, hydrocephalus,
extra-axial collection or mass lesion/mass effect. Chronic atrophic
and ischemic changes are noted and stable from the prior exam.

Vascular: No hyperdense vessel or unexpected calcification.

Skull: Normal. Negative for fracture or focal lesion.

Sinuses/Orbits: No acute finding.

Other: None.
IMPRESSION: Chronic atrophic and ischemic changes without acute abnormality.

## 2021-09-14 IMAGING — DX DG FOREARM 2V*L*
2 series · 2 of 2 positions shown · non-contrast
Comparison: None.

CLINICAL DATA: Fall on the floor. Abrasion on the posterior aspect
of the left elbow

EXAM:
LEFT FOREARM - 2 VIEW

[forearm ap]
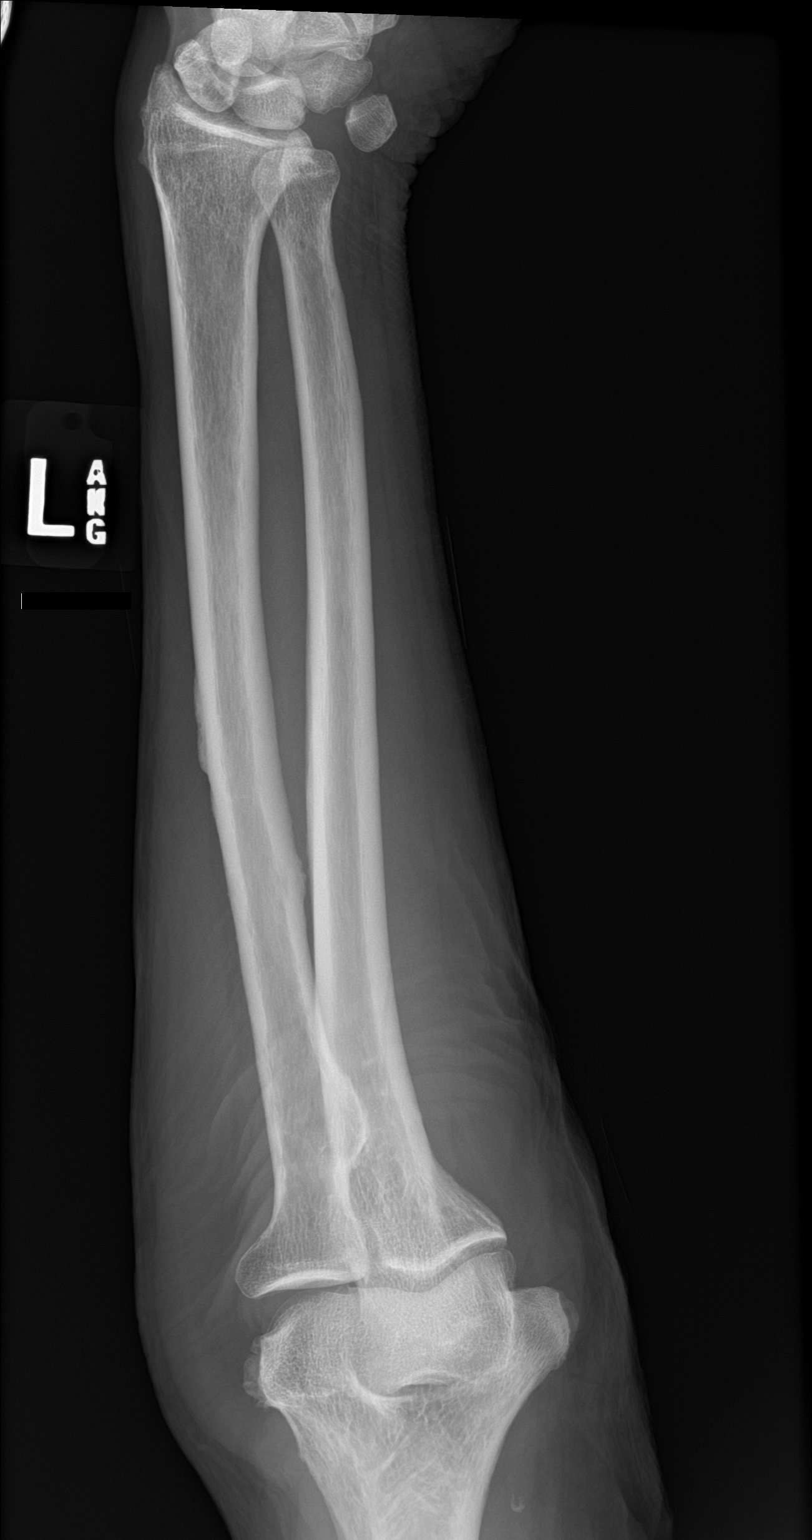

[forearm lat]
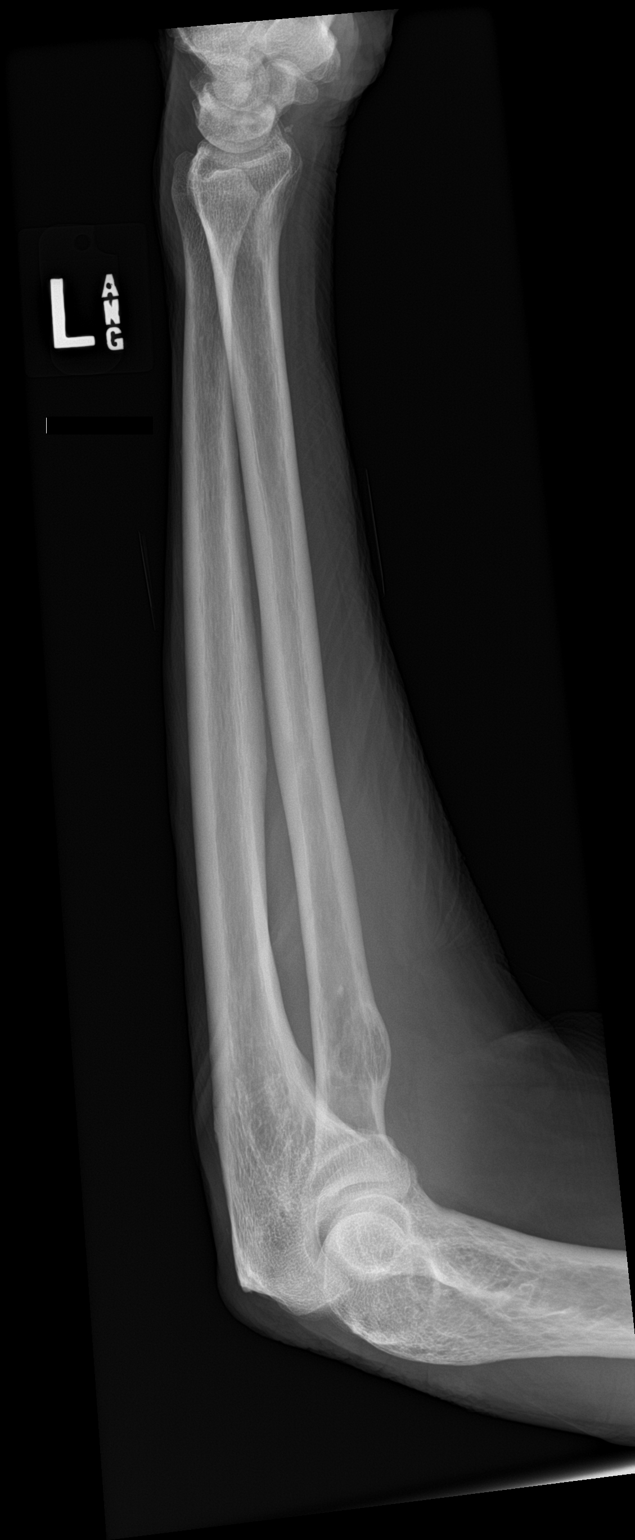

[2 of 2 positions shown; findings below may reference images not displayed]

FINDINGS: There is no evidence of fracture or other focal bone lesions. Soft
tissues are unremarkable.
IMPRESSION: No acute fracture or dislocation.

## 2021-09-14 MED ORDER — NICOTINE POLACRILEX 2 MG MT GUM
2.0000 mg | CHEWING_GUM | OROMUCOSAL | Status: DC | PRN
  Administered 2021-09-14 – 2021-09-15 (×2): 2 mg via ORAL
  Filled 2021-09-14 (×3): qty 1

## 2021-09-14 MED ORDER — CHLORHEXIDINE GLUCONATE CLOTH 2 % EX PADS
6.0000 | MEDICATED_PAD | Freq: Every day | CUTANEOUS | Status: DC
  Administered 2021-09-14 – 2021-09-21 (×8): 6 via TOPICAL

## 2021-09-14 MED ORDER — LACTATED RINGERS IV SOLN: INTRAVENOUS | Status: AC

## 2021-09-14 MED ORDER — SODIUM CHLORIDE 0.9 % IV BOLUS
1000.0000 mL | Freq: Once | INTRAVENOUS | Status: AC
  Administered 2021-09-14: 12:00:00 1000 mL via INTRAVENOUS

## 2021-09-14 MED ORDER — SODIUM BICARBONATE 650 MG PO TABS
650.0000 mg | ORAL_TABLET | Freq: Three times a day (TID) | ORAL | Status: DC
  Administered 2021-09-14 (×3): 650 mg via ORAL
  Filled 2021-09-14 (×5): qty 1

## 2021-09-14 MED ORDER — SODIUM CHLORIDE 0.9% FLUSH: 10.0000 mL | INTRAVENOUS | Status: DC | PRN

## 2021-09-14 NOTE — ED Notes (Signed)
Pt assisted to bed side commode to have a bowel movement. He was able to take 3 steps with only standby assist to the sink to wash his hands.

## 2021-09-14 NOTE — ED Notes (Signed)
Pt assisted with urinal

## 2021-09-14 NOTE — Discharge Summary (Incomplete)
Physician Discharge Summary  Gregory Hodges BWL:[id number] DOB: December 03, 1940 DOA: 09/13/2021  PCP: [name], MD  Admit date: 09/13/2021 Discharge date: 09/14/2021  Admitted From: Home Disposition: Home with [place] [facility]  Recommendations for Outpatient Follow-up:  Follow up with PCP in 1-2 weeks Please obtain BMP/CBC in one week Please follow up on the following pending results:  Home Health:***  Equipment/Devices:***    Discharge Condition:***  CODE STATUS:***  Diet recommendation:   Brief/Interim Summary: ***  Discharge Diagnoses:  Principal Problem:   Generalized weakness Active Problems:   Small cell lung cancer, right ([place])   Fall at home, initial encounter   Metabolic acidosis, normal anion gap (NAG)   Protein calorie malnutrition (HCC)   Dehydration   Acute prerenal azotemia   Hyperlipemia   Hypertension    Discharge Instructions   Allergies as of 09/14/2021       Reactions   Oxycodone Nausea And Vomiting   Pt states he can not take any narcotic pain pills   Codeine Nausea And Vomiting   All Narcotics   Lisinopril    Throat swell up   Oxycodone Hcl Nausea And Vomiting     Med Rec must be completed prior to using this [employer]***       Allergies  Allergen Reactions   Oxycodone Nausea And Vomiting    Pt states he can not take any narcotic pain pills   Codeine Nausea And Vomiting    All Narcotics   Lisinopril     Throat swell up   Oxycodone Hcl Nausea And Vomiting    Consultations: ***   Procedures/Studies: DG Pelvis 1-2 Views  Result Date: 09/13/2021 CLINICAL DATA:  Pain after fall. EXAM: PELVIS - 1-2 VIEW COMPARISON:  None. FINDINGS: There is no evidence of pelvic fracture or diastasis. No pelvic bone lesions are seen. IMPRESSION: Negative. Electronically Signed   By: Dorise Bullion III M.D.   On: 09/13/2021 17:56   CT Head Wo Contrast  Result Date: 09/09/2021 CLINICAL DATA:  [place], hit head EXAM: CT HEAD WITHOUT CONTRAST  TECHNIQUE: Contiguous axial images were obtained from the base of the skull through the vertex without intravenous contrast. COMPARISON:  08/29/2021 FINDINGS: Brain: No acute infarct or hemorrhage. Lateral ventricles and midline structures are stable. Chronic small vessel ischemic changes are again seen throughout the periventricular and subcortical white matter. No acute extra-axial fluid collections. No mass effect. Vascular: Stable atherosclerosis.  No hyperdense vessel. Skull: Normal. Negative for fracture or focal lesion. Sinuses/Orbits: No acute finding. Other: None. IMPRESSION: 1. Stable head CT, no acute intracranial process. Electronically Signed   By: Randa Ngo M.D.   On: 09/09/2021 16:55   CT Head Wo Contrast  Result Date: 08/29/2021 CLINICAL DATA:  Mental status change. Leg weakness for 2 days. History of small-cell lung cancer. EXAM: CT HEAD WITHOUT CONTRAST TECHNIQUE: Contiguous axial images were obtained from the base of the skull through the vertex without intravenous contrast. COMPARISON:  MR head 05/26/2021 FINDINGS: Brain: No evidence of acute infarction, hemorrhage, hydrocephalus, extra-axial collection or mass lesion/mass effect. Hypodensities in the periventricular and subcortical white matter, consistent with chronic small-vessel ischemic changes. Vascular: No hyperdense vessel or unexpected calcification. Skull: Normal. Negative for fracture or focal lesion. Sinuses/Orbits: No acute finding. Other: None. IMPRESSION: No acute intracranial abnormality. Electronically Signed   By: Ileana Roup M.D.   On: 08/29/2021 10:56   CT Cervical Spine Wo Contrast  Result Date: 09/09/2021 CLINICAL DATA:  [place], hit head EXAM: CT CERVICAL  SPINE WITHOUT CONTRAST TECHNIQUE: Multidetector CT imaging of the cervical spine was performed without intravenous contrast. Multiplanar CT image reconstructions were also generated. COMPARISON:  None. FINDINGS: Alignment: There is reversal of cervical lordosis  centered at C4, likely due to multilevel facet hypertrophy and spondylosis. Otherwise alignment is anatomic. Skull base and vertebrae: No acute fracture. No primary bone lesion or focal pathologic process. Soft tissues and spinal canal: No prevertebral fluid or swelling. No visible canal hematoma. Marked atherosclerosis at the carotid bifurcations. Right internal jugular catheter partially visualized. Disc levels: Prominent hypertrophic changes at the C1-C2 interface. Mild spondylosis from C3 through C6. Prominent facet hypertrophic changes at C2-3 and C3-4. Upper chest: Airway is patent. Emphysematous changes are seen at the lung apices. Stable 4 mm left apical nodule. Other: Reconstructed images demonstrate no additional findings. IMPRESSION: 1. No acute cervical spine fracture. 2. Multilevel spondylosis and facet hypertrophy. Electronically Signed   By: Randa Ngo M.D.   On: 09/09/2021 16:57   MR Brain W and Wo Contrast  Result Date: 09/13/2021 CLINICAL DATA:  Weakness and fatigue, history of lung cancer EXAM: MRI HEAD WITHOUT AND WITH CONTRAST TECHNIQUE: Multiplanar, multiecho pulse sequences of the brain and surrounding structures were obtained without and with intravenous contrast. CONTRAST:  32m GADAVIST GADOBUTROL 1 MMOL/ML IV SOLN COMPARISON:  CT head 09/09/2021, MR head 05/16/2021 FINDINGS: Brain: There is no evidence of acute intracranial hemorrhage, extra-axial fluid collection, or acute infarct. There is mild-to-moderate global parenchymal volume loss. Patchy FLAIR signal abnormality throughout the subcortical and periventricular white matter likely reflects sequela of moderate chronic white matter microangiopathy. Foci of SWI signal dropout in the left frontal lobe anteriorly and left temporal lobe laterally are unchanged consistent with old blood products probably related to prior metastatic disease. The previously seen enhancing lesion in the right cerebellar hemisphere is no longer  identified. SWI signal dropout is seen in this location consistent with old blood products related to the previously metastatic lesion. The previously seen enhancing lesion in the medial right temporal lobe is also no longer identified. There was likely a lesion n in the medial right frontal lobe on the prior study which is also no longer present. There is no evidence of active intracranial metastatic disease on the current study. Vascular: Normal flow voids. Skull and upper cervical spine: Normal marrow signal. Sinuses/Orbits: The paranasal sinuses are clear. The globes and orbits are unremarkable. Other: None. IMPRESSION: 1. Previously seen enhancing lesions in the right cerebellar hemisphere, right frontal lobe, and right medial temporal lobe are no longer identified. No evidence of active intracranial metastatic disease on the current study. 2. No other acute intracranial pathology. 3. Unchanged global parenchymal volume loss and moderate chronic white matter microangiopathy. Electronically Signed   By: PValetta MoleM.D.   On: 09/13/2021 19:04   DG Chest Port 1 View  Result Date: 09/13/2021 CLINICAL DATA:  Altered level of consciousness. EXAM: PORTABLE CHEST 1 VIEW COMPARISON:  Chest x-ray 08/29/2021. FINDINGS: Right chest port catheter tip projects over the mid SVC, unchanged. The cardiomediastinal silhouette is stable and within normal limits. Large hiatal hernia is again noted. The lungs are clear. There is no pleural effusion or pneumothorax. No acute fractures are seen. IMPRESSION: 1. No active disease. 2. Stable large hiatal hernia. Electronically Signed   By: ARonney AstersM.D.   On: 09/13/2021 15:17   DG Chest Portable 1 View  Result Date: 08/29/2021 CLINICAL DATA:  Altered mental status. EXAM: PORTABLE CHEST 1 VIEW COMPARISON:  Chest  radiograph 01/23/2021; CT chest 08/11/2021 FINDINGS: Right IJ power injectable port central venous catheter tip projects at the level of the mid SVC. The heart  size and mediastinal contours are within normal limits. Aortic calcifications. Both lungs are clear. Hiatal hernia. Sclerotic lesion an old fracture of the posterior right seventh rib, better appreciated on recent cross-sectional imaging. No acute osseous abnormality. Surgical clips overlie the upper chest wall bilaterally. IMPRESSION: 1. No acute cardiopulmonary abnormality. 2. Sclerotic lesion and healing fracture of the posterior right seventh rib, better appreciated on recent cross-sectional imaging. 3. Aortic Atherosclerosis (ICD10-I70.0). 4. Hiatal hernia, stable. Electronically Signed   By: Ileana Roup M.D.   On: 08/29/2021 09:51   DG Hand Complete Right  Result Date: 09/09/2021 CLINICAL DATA:  Fall, hand pain, laceration EXAM: RIGHT HAND - COMPLETE 3+ VIEW COMPARISON:  None. FINDINGS: No acute bony abnormality. Specifically, no fracture, subluxation, or dislocation. Joint spaces maintained. Old ulnar styloid fracture. Soft tissues are intact. IMPRESSION: No acute bony abnormality. Electronically Signed   By: Rolm Baptise M.D.   On: 09/09/2021 17:29   DG Hip Unilat W or Wo Pelvis 2-3 Views Left  Result Date: 09/13/2021 CLINICAL DATA:  Pain with weight-bearing. EXAM: DG HIP (WITH OR WITHOUT PELVIS) 2-3V LEFT COMPARISON:  None. FINDINGS: There is no evidence of hip fracture or dislocation. Mild degenerative changes are noted in the form of acetabular sclerosis. Marked severity vascular calcification is seen. IMPRESSION: 1. No acute fracture or dislocation. 2. Mild degenerative changes. Electronically Signed   By: Virgina Norfolk M.D.   On: 09/13/2021 19:48     Subjective:   Discharge Exam: Vitals:   09/14/21 1030 09/14/21 1046  BP: 115/68 115/68  Pulse: 65 (!) 6  Resp: 18 18  Temp:    SpO2: 97% 100%   Vitals:   09/14/21 0735 09/14/21 0800 09/14/21 1030 09/14/21 1046  BP: 101/77 114/63 115/68 115/68  Pulse: (!) 57 (!) 55 65 (!) 6  Resp: 18 19 18 18   Temp:      TempSrc:       SpO2: 97% 94% 97% 100%  Weight:      Height:        General: Pt is alert, awake, not in acute distress Cardiovascular: RRR, S1/S2 +, no rubs, no gallops Respiratory: CTA bilaterally, no wheezing, no rhonchi Abdominal: Soft, NT, ND, bowel sounds + Extremities: no edema, no cyanosis    The results of significant diagnostics from this hospitalization (including imaging, microbiology, ancillary and laboratory) are listed below for reference.     Microbiology: Recent Results (from the past 240 hour(s))  Culture, Urine     Status: None   Collection Time: 09/09/21 11:27 AM   Specimen: Urine, Clean Catch  Result Value Ref Range Status   Specimen Description   Final    URINE, CLEAN CATCH Performed at [facility], [place] [address] [place], [place] [address]    Special Requests   Final    NONE Performed at [facility], [place] [address], [place], [place] [address]    Culture   Final    NO GROWTH Performed at [facility], [place] [address] [place], [place] [address]    Report Status 09/10/2021 FINAL  Final  Resp Panel by RT-PCR (Flu A&B, Covid) Nasopharyngeal Swab     Status: None   Collection Time: 09/13/21  8:46 PM   Specimen: Nasopharyngeal Swab; Nasopharyngeal(NP) swabs in vial transport medium  Result Value Ref  Range Status   SARS Coronavirus 2 by RT PCR NEGATIVE NEGATIVE Final    Comment: (NOTE) SARS-CoV-2 target nucleic acids are NOT DETECTED.  The SARS-CoV-2 RNA is generally detectable in upper respiratory specimens during the acute phase of infection. The lowest concentration of SARS-CoV-2 viral copies this assay can detect is 138 copies/mL. A negative result does not preclude SARS-Cov-2 infection and should not be used as the sole basis for treatment or other patient management decisions. A negative result may occur with  improper specimen collection/handling, submission of specimen other than nasopharyngeal swab,  presence of viral mutation(s) within the areas targeted by this assay, and inadequate number of viral copies(<138 copies/mL). A negative result must be combined with clinical observations, patient history, and epidemiological information. The expected result is Negative.  Fact Sheet for Patients:  EntrepreneurPulse.com.au  Fact Sheet for Healthcare Providers:  IncredibleEmployment.be  This test is no t yet approved or cleared by the [place] FDA and  has been authorized for detection and/or diagnosis of SARS-CoV-2 by FDA under an Emergency Use Authorization (EUA). This EUA will remain  in effect (meaning this test can be used) for the duration of the COVID-19 declaration under Section 564(b)(1) of the Act, 21 U.S.C.section 360bbb-3(b)(1), unless the authorization is terminated  or revoked sooner.       Influenza A by PCR NEGATIVE NEGATIVE Final   Influenza B by PCR NEGATIVE NEGATIVE Final    Comment: (NOTE) The Xpert Xpress SARS-CoV-2/FLU/RSV plus assay is intended as an aid in the diagnosis of influenza from Nasopharyngeal swab specimens and should not be used as a sole basis for treatment. Nasal washings and aspirates are unacceptable for Xpert Xpress SARS-CoV-2/FLU/RSV testing.  Fact Sheet for Patients: EntrepreneurPulse.com.au  Fact Sheet for Healthcare Providers: IncredibleEmployment.be  This test is not yet approved or cleared by the [place] FDA and has been authorized for detection and/or diagnosis of SARS-CoV-2 by FDA under an Emergency Use Authorization (EUA). This EUA will remain in effect (meaning this test can be used) for the duration of the COVID-19 declaration under Section 564(b)(1) of the Act, 21 U.S.C. section 360bbb-3(b)(1), unless the authorization is terminated or revoked.  Performed at [facility], [place] [address], [place], [place] [address]      Labs: BNP (last 3 results) No results for input(s): BNP in the last 8760 hours. Basic Metabolic Panel: Recent Labs  Lab 09/09/21 1127 09/13/21 1447 09/14/21 0520  NA 140 140 139  K 4.9 4.6 4.6  CL 115* 115* 114*  CO2 17* 18* 17*  GLUCOSE 81 90 89  BUN 42* 36* 29*  CREATININE 1.58* 1.41* 1.14  CALCIUM 8.7* 8.8* 8.7*  MG  --   --  2.0   Liver Function Tests: Recent Labs  Lab 09/09/21 1127 09/13/21 1447 09/14/21 0520  AST 9* 11* 14*  ALT 6 9 8   ALKPHOS 53 46 45  BILITOT 0.6 0.5 1.0  PROT 6.0* 5.7* 6.1*  ALBUMIN 3.7 3.5 3.6   No results for input(s): LIPASE, AMYLASE in the last 168 hours. No results for input(s): AMMONIA in the last 168 hours. CBC: Recent Labs  Lab 09/09/21 1127 09/13/21 1447 09/14/21 0520  WBC 4.1 4.5 5.1  NEUTROABS 3.4 3.7 4.3  HGB 11.9* 11.0* 11.6*  HCT 35.6* 34.2* 35.5*  MCV 93.4 96.1 96.7  PLT 69* 100* 111*   Cardiac Enzymes: Recent Labs  Lab 09/14/21 0520  CKTOTAL 135   BNP: Invalid input(s): POCBNP CBG: No results for  input(s): GLUCAP in the last 168 hours. D-Dimer No results for input(s): DDIMER in the last 72 hours. Hgb A1c No results for input(s): HGBA1C in the last 72 hours. Lipid Profile No results for input(s): CHOL, HDL, LDLCALC, TRIG, CHOLHDL, LDLDIRECT in the last 72 hours. Thyroid function studies Recent Labs    09/14/21 0520  TSH 0.664   Anemia work up No results for input(s): VITAMINB12, FOLATE, FERRITIN, TIBC, IRON, RETICCTPCT in the last 72 hours. Urinalysis    Component Value Date/Time   COLORURINE YELLOW 09/13/2021 2145   APPEARANCEUR CLOUDY (A) 09/13/2021 2145   LABSPEC 1.023 09/13/2021 2145   PHURINE 5.0 09/13/2021 2145   GLUCOSEU NEGATIVE 09/13/2021 2145   HGBUR NEGATIVE 09/13/2021 2145   BILIRUBINUR NEGATIVE 09/13/2021 2145   KETONESUR NEGATIVE 09/13/2021 2145   PROTEINUR NEGATIVE 09/13/2021 2145   UROBILINOGEN 0.2 07/22/2010 1113   NITRITE NEGATIVE 09/13/2021 2145   LEUKOCYTESUR NEGATIVE  09/13/2021 2145   Sepsis Labs Invalid input(s): PROCALCITONIN,  WBC,  LACTICIDVEN Microbiology Recent Results (from the past 240 hour(s))  Culture, Urine     Status: None   Collection Time: 09/09/21 11:27 AM   Specimen: Urine, Clean Catch  Result Value Ref Range Status   Specimen Description   Final    URINE, CLEAN CATCH Performed at [facility], [place] [address] [place], [place] [address]    Special Requests   Final    NONE Performed at [facility], [place] [address], [place], [place] [address]    Culture   Final    NO GROWTH Performed at [facility], [place] [address], [place], [place] [address]    Report Status 09/10/2021 FINAL  Final  Resp Panel by RT-PCR (Flu A&B, Covid) Nasopharyngeal Swab     Status: None   Collection Time: 09/13/21  8:46 PM   Specimen: Nasopharyngeal Swab; Nasopharyngeal(NP) swabs in vial transport medium  Result Value Ref Range Status   SARS Coronavirus 2 by RT PCR NEGATIVE NEGATIVE Final    Comment: (NOTE) SARS-CoV-2 target nucleic acids are NOT DETECTED.  The SARS-CoV-2 RNA is generally detectable in upper respiratory specimens during the acute phase of infection. The lowest concentration of SARS-CoV-2 viral copies this assay can detect is 138 copies/mL. A negative result does not preclude SARS-Cov-2 infection and should not be used as the sole basis for treatment or other patient management decisions. A negative result may occur with  improper specimen collection/handling, submission of specimen other than nasopharyngeal swab, presence of viral mutation(s) within the areas targeted by this assay, and inadequate number of viral copies(<138 copies/mL). A negative result must be combined with clinical observations, patient history, and epidemiological information. The expected result is Negative.  Fact Sheet for Patients:  EntrepreneurPulse.com.au  Fact Sheet for Healthcare  Providers:  IncredibleEmployment.be  This test is no t yet approved or cleared by the [place] FDA and  has been authorized for detection and/or diagnosis of SARS-CoV-2 by FDA under an Emergency Use Authorization (EUA). This EUA will remain  in effect (meaning this test can be used) for the duration of the COVID-19 declaration under Section 564(b)(1) of the Act, 21 U.S.C.section 360bbb-3(b)(1), unless the authorization is terminated  or revoked sooner.       Influenza A by PCR NEGATIVE NEGATIVE Final   Influenza B by PCR NEGATIVE NEGATIVE Final    Comment: (NOTE) The Xpert Xpress SARS-CoV-2/FLU/RSV plus assay is intended as an aid in the diagnosis of influenza from  Nasopharyngeal swab specimens and should not be used as a sole basis for treatment. Nasal washings and aspirates are unacceptable for Xpert Xpress SARS-CoV-2/FLU/RSV testing.  Fact Sheet for Patients: EntrepreneurPulse.com.au  Fact Sheet for Healthcare Providers: IncredibleEmployment.be  This test is not yet approved or cleared by the [place] FDA and has been authorized for detection and/or diagnosis of SARS-CoV-2 by FDA under an Emergency Use Authorization (EUA). This EUA will remain in effect (meaning this test can be used) for the duration of the COVID-19 declaration under Section 564(b)(1) of the Act, 21 U.S.C. section 360bbb-3(b)(1), unless the authorization is terminated or revoked.  Performed at [facility], [place] [address], [place], [place] [address]    Time coordinating discharge: 35 minutes  SIGNED:  Kerney Elbe, DO [facility] 09/14/2021, 12:11 PM Pager is on [place]  If 7PM-7AM, please contact night-coverage [email or web address]

## 2021-09-14 NOTE — ED Notes (Signed)
While attempting to get up without assistance pt slid from chair onto the floor, MD Omair aware.

## 2021-09-14 NOTE — Progress Notes (Signed)
TOC CSW spoke with pt's wife Akshat Minehart [phone]), she stated a rollator was ordered for pt, through [employer]. Pt's wife is also requesting a bedside commode. CSW made MD aware.   Arlie Solomons.Ario Mcdiarmid, MSW, [place]   Transitions of Care Clinical Social Worker I Direct Dial: [phone]   Fax: [phone] [email or web address]

## 2021-09-14 NOTE — ED Notes (Signed)
Pt ambulated to the restroom with a walker. No other assistance needed.

## 2021-09-14 NOTE — Evaluation (Signed)
Occupational Therapy Evaluation Patient Details Name: Gregory Hodges MRN: [id number] DOB: [birth year] Today's Date: 09/14/2021   History of Present Illness Gregory Hodges is a 80 y.o. 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 is a 80 year old man who presents with generalized weakness and impaired balance. On evaluation he is able to grossly perform his ADLs needing min guard for standing due to his history of falls. Today he is slightly unsteady but can use hands to manage clothing and perform LB dressing in seated position using figure four position. He ambulated in hall with RW and rollator with min guard exhibiting increased unsteadiness with rollator and during turns. From a self care stand point patient has no OT needs as he is limited by his impaired balance and OT will defer to PT for balance training. From chart review looks like patient's primary care MD had set up [facility] PT but PT had not yet been initiated. Recommend return home with 24/7 supervision and [facility] PT at discharge.     Recommendations for follow up therapy are one component of a multi-disciplinary discharge planning process, led by the attending physician.  Recommendations may be updated based on patient status, additional functional criteria and insurance authorization.   Follow Up Recommendations  No OT follow up    Assistance Recommended at Discharge Frequent or constant Supervision/Assistance  Functional Status Assessment  Patient has had a recent decline in their functional status and demonstrates the ability to make significant improvements in function in a reasonable and predictable amount of time.  Equipment Recommendations  None recommended  by OT    Recommendations for Other Services       Precautions / Restrictions Precautions Precautions: Fall Restrictions Weight Bearing Restrictions: No      Mobility Bed Mobility Overal bed mobility: Modified Independent             General bed mobility comments: increased time, reports some pain in right hip with bringing leg bak on to bed.    Transfers Overall transfer level: Needs assistance   Transfers: Sit to/from Stand Sit to Stand: Min guard           General transfer comment: Min guard to ambulate in hall with RW then rollator. Patient slightly unsteady with a mild posterior tilit and moreso with turns. Attempted use with rollator with patient more unsteady compared to RW.      Balance Overall balance assessment: History of Falls;Needs assistance Sitting-balance support: No upper extremity supported;Feet supported Sitting balance-Leahy Scale: Fair     Standing balance support: Bilateral upper extremity supported Standing balance-Leahy Scale: Fair                             ADL either performed or assessed with clinical judgement   ADL Overall ADL's : Needs assistance/impaired Eating/Feeding: Independent   Grooming: Independent   Upper Body Bathing: Independent   Lower Body Bathing: Min guard   Upper Body Dressing : Independent   Lower Body Dressing: Min guard   Toilet Transfer: Min guard;Rolling walker (2 wheels);Regular Toilet   Toileting- Clothing Manipulation and Hygiene: Min guard   Tub/ Shower Transfer: Min guard   Functional mobility during ADLs: Min guard;Rolling  walker (2 wheels)       Vision Patient Visual Report: No change from baseline       Perception     Praxis      Pertinent Vitals/Pain Pain Assessment: Faces Faces Pain Scale: Hurts little more Pain Location: R hip Pain Descriptors / Indicators: Aching;Grimacing Pain Intervention(s): Limited activity within patient's tolerance;Monitored during  session     Hand Dominance Right   Extremity/Trunk Assessment Upper Extremity Assessment Upper Extremity Assessment: RUE deficits/detail;LUE deficits/detail RUE Deficits / Details: WFL ROM, 5/5 strength (bruising noted all over arm) RUE Sensation: WNL RUE Coordination: WNL LUE Deficits / Details: WFL ROM, 5/5 strength (bruising noted all over arm) LUE Sensation: WNL LUE Coordination: WNL   Lower Extremity Assessment Lower Extremity Assessment: Defer to PT evaluation   Cervical / Trunk Assessment Cervical / Trunk Assessment: Normal   Communication Communication Communication: No difficulties   Cognition Arousal/Alertness: Awake/alert Behavior During Therapy: WFL for tasks assessed/performed Overall Cognitive Status: Within Functional Limits for tasks assessed                                       General Comments       Exercises     Shoulder Instructions      Home Living Family/patient expects to be discharged to:: Private residence Living Arrangements: Spouse/significant other Available Help at Discharge: Family;Available 24 hours/day Type of Home: House Home Access: Stairs to enter [employer] of Steps: 2-3 from the garage   Home Layout: Two level Alternate Level Stairs-Number of Steps: 14   Bathroom Shower/Tub: Walk-in shower;Tub/shower unit   Bathroom Toilet: Handicapped height     Home Equipment: Conservation officer, nature (2 wheels);Cane - single point;BSC/3in1;Shower seat;Grab bars - tub/shower          Prior Functioning/Environment               Mobility Comments: has been using a cane predominantly          OT Problem List: Impaired balance (sitting and/or standing)      OT Treatment/Interventions:      OT Goals(Current goals can be found in the care plan section) Acute Rehab OT Goals OT Goal Formulation: All assessment and education complete, DC therapy  OT Frequency:     Barriers to D/C:             Co-evaluation              AM-PAC OT "6 Clicks" Daily Activity     Outcome Measure Help from another person eating meals?: None Help from another person taking care of personal grooming?: None Help from another person toileting, which includes using toliet, bedpan, or urinal?: A Little Help from another person bathing (including washing, rinsing, drying)?: None Help from another person to put on and taking off regular upper body clothing?: None Help from another person to put on and taking off regular lower body clothing?: A Little 6 Click Score: 22   End of Session Equipment Utilized During Treatment: Rolling walker (2 wheels);Gait belt Nurse Communication: Mobility status  Activity Tolerance: Patient tolerated treatment well Patient left: in bed;with call bell/phone within reach  OT Visit Diagnosis: Unsteadiness on feet (R26.81)                Time: 4431-5400 OT Time Calculation (min): 28 min Charges:  OT General Charges $OT Visit: 1 Visit OT Evaluation $OT Eval Low  Complexity: 1 Low  Aadon Gorelik, OTR/L [place]  Office [phone] Pager: [place] 09/14/2021, 8:53 AM

## 2021-09-14 NOTE — ED Notes (Signed)
Dinner provided.

## 2021-09-14 NOTE — ED Notes (Signed)
Bladder scan showed approx 520 cc of urine, repeated x2 for accuracy.

## 2021-09-14 NOTE — Progress Notes (Signed)
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  Patient had reported 1 to 2 weeks of generalized weakness and absence of any acute focal weakness.  He had generalized weakness with difficulty with independent completion of his ADLs with progressive difficulty ambulation resulting in multiple falls.  Yesterday's fall he struck the floor with his left hip and had sharp left nonradiating hip discomfort.  He was able to wear weight on the left lower extremity but had no exacerbation of his left hip.  The falls were not associate with any loss of consciousness and patient admits that he did not hit his head.  He is on daily aspirin and blood thinners at home and PT was placed yesterday by his outpatient provider along with a four-wheel walker although it has not been delivered yet.  Given his recurrent weakness and recurrent falls and brought into the hospital for observation.  Patient improved and PT OT recommending home health however patient's family had significant concerns for safe returning home given that patient's recurrent falls and because he remains somewhat altered.  Psychiatry was consulted for capacity evaluation.  Patient was given IV fluid hydration with improvement in his renal function.  He had underwent work-up in the ED and an EKG as well as an MRI of the brain which showed that the enhancing lesions in the right cerebral hemisphere, right frontal lobe and right medial temporal lobe  were no longer identified and there is no evidence of any intracranial metastatic disease.  The EDP discussed with on-call oncologist who recommended admission for generalized weakness.  He was given a liter normal saline bolus and then placed on continuous normal saline at 125 mL/hr and then reduce to 75 MLS per hour.  Assessment & Plan:   Principal Problem:   Generalized weakness Active Problems:   Small cell lung cancer, right (HCC)   Fall at home, initial encounter   Metabolic acidosis, normal anion gap (NAG)   Protein calorie malnutrition (HCC)   Dehydration   Acute prerenal azotemia   Hyperlipemia   Hypertension  Generalized weakness with recurrent falls and physical deconditioning -He has had 1 to 2 weeks of progressive generalized weakness and absence of any focal neurologic deficit -MRI brain performed as above with no acute ischemic infarct and showed improvement in his metastatic processes -Getting IV fluid hydration and his generalized weakness is likely multifactorial -Oncology was consulted and recommended observation in hospital -Patient is given PT OT consults which recommended home health however given his concern for his confusion we need to verify safe discharge disposition for the patient and psychiatry has been consulted for capacity evaluation -TSH was normal -We will repeat labs in the a.m. -Prealbumin was normal -Nutrition consulted for poor p.o. intake -Continue monitor and trend and reevaluate in the morning and discussed with psych about his capacity  Recurrent falls with unwitnessed fall in the ED -Patient has had progressive generalized weakness and recurrent falls -MRI brain as above -Had a unwitnessed fall in the ED so  we will obtain a stat head CT scan -He has metastatic disease with a consequence of going under chemotherapy.  Likely his falls have been in the setting of dehydration given his poor p.o. intake and so we will give him IV fluid hydration  and continue and given another liter bolus -Check orthostatic vital signs are still pending to be done -Oncology will be checking on the patient -Continue with fall precautions and urinalysis was unremarkable -TSH was unremarkable -Continue with lactated Ringer's at 75 MLS per hour for another 10 hours and given another normal same bolus -Follow-up on repeat head CT scan for fall  Agitation mild confusion -On my exam he appeared to be alert and oriented x3 however wife and daughter states that he is not as baseline -He is holding onto his urine possibly so we will check a bladder scan -Follow-up and had CT scan -Psychiatry has been consulted for capacity evaluation   Nongap metabolic acidosis -Continue IV fluid hydrations and change from normal saline to LR in the setting of hyperchloremia -I have added sodium bicarb tabs as well -Continuing to monitor and trend and repeat CMP in a.m. with strict I's and O's   Protein calorie malnutrition with weight loss that is unintentional -Has been on Megace and diagnosed with small cell cancer -Estimated body mass index is 20.96 kg/m as calculated from the following:   Height as of this encounter: 5' 9.5" (1.765 m).   Weight as of this encounter: 65.3 kg. -Nutrition was consulted and prealbumin was normal    Dehydration -Continue with IV fluid hydration as above -Strict I's and O's and daily weights -Has had poor p.o. intake -Continue IV fluid regimen as above   AKI -Improving in the setting of IV fluid hydration -Patient's BUNs/creatinine went from 36/1.41 and is now 29/1.14 -Continue with IV fluid hydration as above -Nephrotoxic medications, contrast dyes, hypotension and renally dose medications -Repeat CMP in the a.m.   Essential Hypertension -Continue to monitor blood pressures per protocol -Takes atenolol in outpatient setting but this was held given his borderline bradycardia   Hyperlipidemia -Continue with home statin    GERD -Continue PPI   Normocytic anemia, chronic -Patient's baseline hemoglobin is within 10-13 -Patient BUN/creatinine went from 11.0/34.2 is now 11.6/35.5 -Check anemia panel in the AM  -Continue to monitor for signs and symptoms bleeding; currently no overt bleeding noted -Repeat CBC in the a.m.     Tobacco abuse -Smoking cessation counseling given and has been smoking greater then 30 years -Continue nicotine patch and nicotine gum  DVT prophylaxis: SCDs Code Status: FULL CODE Family Communication: Discussed with Daughter over the telephone Disposition Plan: Pending determiniation if Home is a Safe Discharge Disposition for the patient   Status is: Observation  The patient remains OBS appropriate and will d/c before 2 midnights.  Consultants:  Psychiatry Medical Oncology   Procedures: MRI  Antimicrobials:  Anti-infectives (From admission, onward)    Start     Dose/Rate Route Frequency Ordered Stop   09/14/21 1000  sulfamethoxazole-trimethoprim (BACTRIM DS) 800-160 MG per tablet 1 tablet        1 tablet Oral 2 times daily 09/13/21 2107          Subjective: Seen and examined at bedside and he appeared to be awake and alert and oriented x3 but he was very upset when I walked in.  States that no one relayed to him the plan of care.  I discussed with him about his findings and  was planning on letting him go home given that he did well with therapy however family had significant surgical his wellbeing and stated that he was confused and that he would just tell me anything to try and get discharged.  I ordered capacity evaluation by psychiatry.  While in the ED after I left the patient had an unwitnessed fall given his instability and hit his head.  We will scan his head again and continue to monitor and determine if it is safe disposition to discharge him home and have PT OT reevaluate in the morning.  Objective: Vitals:   09/14/21 0615 09/14/21 0725 09/14/21 0735 09/14/21  0800  BP:  (!) 88/61 101/77 114/63  Pulse: (!) 51 60 (!) 57 (!) 55  Resp:  18 18 19   Temp:      TempSrc:      SpO2: 100% 100% 97% 94%  Weight:      Height:       No intake or output data in the 24 hours ending 09/14/21 0920 Filed Weights   09/13/21 1454  Weight: 65.3 kg   Examination: Physical Exam:  Constitutional: Thin chronically ill-appearing Caucasian male currently no acute distress appears but is up agitated somewhat Eyes: Lids and conjunctivae normal, sclerae anicteric  ENMT: External Ears, Nose appear normal. Grossly normal hearing. Mucous membranes are moist. Neck: Appears normal, supple, no cervical masses, normal ROM, no appreciable thyromegaly; no appreciable JVD Respiratory: Diminished to auscultation bilaterally, no wheezing, rales, rhonchi or crackles. Normal respiratory effort and patient is not tachypenic. No accessory muscle use.  Unlabored breathing Cardiovascular: RRR, no murmurs / rubs / gallops. S1 and S2 auscultated. No extremity edema. Abdomen: Soft, non-tender, non-distended. Bowel sounds positive.  GU: Deferred. Musculoskeletal: No clubbing / cyanosis of digits/nails. No joint deformity upper and lower extremities Skin: No rashes, lesions, ulcers but has multiple bruises on his arms. No induration; Warm and dry.  Neurologic: CN 2-12 grossly intact with no focal deficits.  Romberg sign cerebellar reflexes not assessed.  Psychiatric: Slightly impaired judgment and insight.  He is awake alert and oriented x 3.  He is agitated but family states that he is confused  Data Reviewed: I have personally reviewed following labs and imaging studies  CBC: Recent Labs  Lab 09/09/21 1127 09/13/21 1447 09/14/21 0520  WBC 4.1 4.5 5.1  NEUTROABS 3.4 3.7 4.3  HGB 11.9* 11.0* 11.6*  HCT 35.6* 34.2* 35.5*  MCV 93.4 96.1 96.7  PLT 69* 100* 810*   Basic Metabolic Panel: Recent Labs  Lab 09/09/21 1127 09/13/21 1447 09/14/21 0520  NA 140 140 139  K 4.9 4.6 4.6   CL 115* 115* 114*  CO2 17* 18* 17*  GLUCOSE 81 90 89  BUN 42* 36* 29*  CREATININE 1.58* 1.41* 1.14  CALCIUM 8.7* 8.8* 8.7*  MG  --   --  2.0   GFR: Estimated Creatinine Clearance: 49.3 mL/min (by C-G formula based on SCr of 1.14 mg/dL). Liver Function Tests: Recent Labs  Lab 09/09/21 1127 09/13/21 1447 09/14/21 0520  AST 9* 11* 14*  ALT 6 9 8   ALKPHOS 53 46 45  BILITOT 0.6 0.5 1.0  PROT 6.0* 5.7* 6.1*  ALBUMIN 3.7 3.5 3.6   No results for input(s): LIPASE, AMYLASE in the last 168 hours. No results for input(s): AMMONIA in the last 168 hours. Coagulation Profile: Recent Labs  Lab 09/14/21 0520  INR 1.2   Cardiac Enzymes: Recent Labs  Lab 09/14/21 0520  CKTOTAL 135  BNP (last 3 results) No results for input(s): PROBNP in the last 8760 hours. HbA1C: No results for input(s): HGBA1C in the last 72 hours. CBG: No results for input(s): GLUCAP in the last 168 hours. Lipid Profile: No results for input(s): CHOL, HDL, LDLCALC, TRIG, CHOLHDL, LDLDIRECT in the last 72 hours. Thyroid Function Tests: Recent Labs    09/14/21 0520  TSH 0.664   Anemia Panel: No results for input(s): VITAMINB12, FOLATE, FERRITIN, TIBC, IRON, RETICCTPCT in the last 72 hours. Sepsis Labs: Recent Labs  Lab 09/13/21 1447  LATICACIDVEN 1.1    Recent Results (from the past 240 hour(s))  Culture, Urine     Status: None   Collection Time: 09/09/21 11:27 AM   Specimen: Urine, Clean Catch  Result Value Ref Range Status   Specimen Description   Final    URINE, CLEAN CATCH Performed at [facility], [place] [address] [place], [place] [address]    Special Requests   Final    NONE Performed at [facility], [place] [address] [place], [place] [address]    Culture   Final    NO GROWTH Performed at [facility], [place] [address], [place], [place] [address]    Report Status 09/10/2021 FINAL  Final  Resp Panel by RT-PCR (Flu A&B, Covid)  Nasopharyngeal Swab     Status: None   Collection Time: 09/13/21  8:46 PM   Specimen: Nasopharyngeal Swab; Nasopharyngeal(NP) swabs in vial transport medium  Result Value Ref Range Status   SARS Coronavirus 2 by RT PCR NEGATIVE NEGATIVE Final    Comment: (NOTE) SARS-CoV-2 target nucleic acids are NOT DETECTED.  The SARS-CoV-2 RNA is generally detectable in upper respiratory specimens during the acute phase of infection. The lowest concentration of SARS-CoV-2 viral copies this assay can detect is 138 copies/mL. A negative result does not preclude SARS-Cov-2 infection and should not be used as the sole basis for treatment or other patient management decisions. A negative result may occur with  improper specimen collection/handling, submission of specimen other than nasopharyngeal swab, presence of viral mutation(s) within the areas targeted by this assay, and inadequate number of viral copies(<138 copies/mL). A negative result must be combined with clinical observations, patient history, and epidemiological information. The expected result is Negative.  Fact Sheet for Patients:  EntrepreneurPulse.com.au  Fact Sheet for Healthcare Providers:  IncredibleEmployment.be  This test is no t yet approved or cleared by the [place] FDA and  has been authorized for detection and/or diagnosis of SARS-CoV-2 by FDA under an Emergency Use Authorization (EUA). This EUA will remain  in effect (meaning this test can be used) for the duration of the COVID-19 declaration under Section 564(b)(1) of the Act, 21 U.S.C.section 360bbb-3(b)(1), unless the authorization is terminated  or revoked sooner.       Influenza A by PCR NEGATIVE NEGATIVE Final   Influenza B by PCR NEGATIVE NEGATIVE Final    Comment: (NOTE) The Xpert Xpress SARS-CoV-2/FLU/RSV plus assay is intended as an aid in the diagnosis of influenza from Nasopharyngeal swab specimens and should not be  used as a sole basis for treatment. Nasal washings and aspirates are unacceptable for Xpert Xpress SARS-CoV-2/FLU/RSV testing.  Fact Sheet for Patients: EntrepreneurPulse.com.au  Fact Sheet for Healthcare Providers: IncredibleEmployment.be  This test is not yet approved or cleared by the [place] FDA and has been authorized for detection and/or diagnosis of SARS-CoV-2 by FDA under an Emergency Use Authorization (EUA). This EUA will  remain in effect (meaning this test can be used) for the duration of the COVID-19 declaration under Section 564(b)(1) of the Act, 21 U.S.C. section 360bbb-3(b)(1), unless the authorization is terminated or revoked.  Performed at [facility], [place] [address] [place], [place] [address]     RN Pressure Injury Documentation:     Estimated body mass index is 20.96 kg/m as calculated from the following:   Height as of this encounter: 5' 9.5" (1.765 m).   Weight as of this encounter: 65.3 kg.  Malnutrition Type:   Malnutrition Characteristics:   Nutrition Interventions:    Radiology Studies: DG Pelvis 1-2 Views  Result Date: 09/13/2021 CLINICAL DATA:  Pain after fall. EXAM: PELVIS - 1-2 VIEW COMPARISON:  None. FINDINGS: There is no evidence of pelvic fracture or diastasis. No pelvic bone lesions are seen. IMPRESSION: Negative. Electronically Signed   By: Dorise Bullion III M.D.   On: 09/13/2021 17:56   MR Brain W and Wo Contrast  Result Date: 09/13/2021 CLINICAL DATA:  Weakness and fatigue, history of lung cancer EXAM: MRI HEAD WITHOUT AND WITH CONTRAST TECHNIQUE: Multiplanar, multiecho pulse sequences of the brain and surrounding structures were obtained without and with intravenous contrast. CONTRAST:  30m GADAVIST GADOBUTROL 1 MMOL/ML IV SOLN COMPARISON:  CT head 09/09/2021, MR head 05/16/2021 FINDINGS: Brain: There is no evidence of acute intracranial hemorrhage, extra-axial fluid  collection, or acute infarct. There is mild-to-moderate global parenchymal volume loss. Patchy FLAIR signal abnormality throughout the subcortical and periventricular white matter likely reflects sequela of moderate chronic white matter microangiopathy. Foci of SWI signal dropout in the left frontal lobe anteriorly and left temporal lobe laterally are unchanged consistent with old blood products probably related to prior metastatic disease. The previously seen enhancing lesion in the right cerebellar hemisphere is no longer identified. SWI signal dropout is seen in this location consistent with old blood products related to the previously metastatic lesion. The previously seen enhancing lesion in the medial right temporal lobe is also no longer identified. There was likely a lesion n in the medial right frontal lobe on the prior study which is also no longer present. There is no evidence of active intracranial metastatic disease on the current study. Vascular: Normal flow voids. Skull and upper cervical spine: Normal marrow signal. Sinuses/Orbits: The paranasal sinuses are clear. The globes and orbits are unremarkable. Other: None. IMPRESSION: 1. Previously seen enhancing lesions in the right cerebellar hemisphere, right frontal lobe, and right medial temporal lobe are no longer identified. No evidence of active intracranial metastatic disease on the current study. 2. No other acute intracranial pathology. 3. Unchanged global parenchymal volume loss and moderate chronic white matter microangiopathy. Electronically Signed   By: PValetta MoleM.D.   On: 09/13/2021 19:04   DG Chest Port 1 View  Result Date: 09/13/2021 CLINICAL DATA:  Altered level of consciousness. EXAM: PORTABLE CHEST 1 VIEW COMPARISON:  Chest x-ray 08/29/2021. FINDINGS: Right chest port catheter tip projects over the mid SVC, unchanged. The cardiomediastinal silhouette is stable and within normal limits. Large hiatal hernia is again noted. The  lungs are clear. There is no pleural effusion or pneumothorax. No acute fractures are seen. IMPRESSION: 1. No active disease. 2. Stable large hiatal hernia. Electronically Signed   By: ARonney AstersM.D.   On: 09/13/2021 15:17   DG Hip Unilat W or Wo Pelvis 2-3 Views Left  Result Date: 09/13/2021 CLINICAL DATA:  Pain with weight-bearing. EXAM: DG HIP (WITH OR WITHOUT PELVIS)  2-3V LEFT COMPARISON:  None. FINDINGS: There is no evidence of hip fracture or dislocation. Mild degenerative changes are noted in the form of acetabular sclerosis. Marked severity vascular calcification is seen. IMPRESSION: 1. No acute fracture or dislocation. 2. Mild degenerative changes. Electronically Signed   By: Virgina Norfolk M.D.   On: 09/13/2021 19:48    Scheduled Meds:  megestrol  400 mg Oral BID   pantoprazole  40 mg Oral Daily   rosuvastatin  10 mg Oral QPM   sulfamethoxazole-trimethoprim  1 tablet Oral BID   Continuous Infusions:   LOS: 0 days   Kerney Elbe, DO [facility] PAGER is on [place]  If 7PM-7AM, please contact night-coverage [email or web address]

## 2021-09-14 NOTE — ED Notes (Signed)
Breakfast provided.

## 2021-09-14 NOTE — Evaluation (Signed)
Physical Therapy Evaluation Patient Details Name: Gregory Hodges MRN: [id number] DOB: 03/18/1941 Today's Date: 09/14/2021  History of Present Illness  Gregory Hodges is a 80 y.o. 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 On evaluation he requires min guard for standing due to his history of falls and ambulated in hall with RW and rollator with min guard exhibiting increased unsteadiness with rollator and during turns.  From chart review looks like patient's primary care MD had set up [facility] PT but PT had not yet been initiated. Recommend return home with 24/7 supervision and [facility] PT at discharge     Recommendations for follow up therapy are one component of a multi-disciplinary discharge planning process, led by the attending physician.  Recommendations may be updated based on patient status, additional functional criteria and insurance authorization.  Follow Up Recommendations Home health PT (Pt states HHPT was recently set up through PCP but service has not started yet)    Assistance Recommended at Discharge Intermittent Supervision/Assistance  Functional Status Assessment Patient has had a recent decline in their functional status and demonstrates the ability to make significant improvements in function in a reasonable and predictable amount of time.  Equipment Recommendations  None recommended by PT    Recommendations for Other Services       Precautions / Restrictions Precautions Precautions: Fall Restrictions Weight Bearing Restrictions: No      Mobility  Bed Mobility Overal bed mobility: Modified Independent              General bed mobility comments: increased time, reports some pain in right hip with bringing leg bak on to bed.    Transfers Overall transfer level: Needs assistance Equipment used: None Transfers: Sit to/from Stand Sit to Stand: Min guard           General transfer comment: steady assist only with cues for use of UEs to self assist    Ambulation/Gait Ambulation/Gait assistance: Min guard Gait Distance (Feet): 150 Feet Assistive device: Rolling walker (2 wheels);Rollator (4 wheels) Gait Pattern/deviations: Step-through pattern;Decreased step length - right;Decreased step length - left;Shuffle;Trunk flexed Gait velocity: decr     General Gait Details: Pt unsteady and largely corrected with use of RW (but still noted mild posterior drift with static standing) and less so with rollator  Stairs            Wheelchair Mobility    Modified Rankin (Stroke Patients Only)       Balance Overall balance assessment: History of Falls;Needs assistance Sitting-balance support: No upper extremity supported;Feet supported Sitting balance-Leahy Scale: Good     Standing balance support: Bilateral upper extremity supported Standing balance-Leahy Scale: Fair                               Pertinent Vitals/Pain Pain Assessment: Faces Faces Pain Scale: Hurts little more Pain Location: R hip Pain Descriptors / Indicators: Aching;Grimacing Pain Intervention(s): Limited activity within patient's tolerance;Monitored during session    [place] expects to be discharged to:: Private residence Living Arrangements: Spouse/significant other Available Help at Discharge: Family;Available 24 hours/day Type of Home: House Home Access:  Stairs to enter Entrance Stairs-Rails: Right;Left;Can reach both Entrance Stairs-Number of Steps: 2-3 from the garage Alternate Level Stairs-Number of Steps: 14 Home Layout: Two level Home Equipment: Conservation officer, nature (2  wheels);Cane - single point;BSC/3in1;Shower seat;Grab bars - tub/shower      Prior Function Prior Level of Function : Independent/Modified Independent             Mobility Comments: has been using a cane predominantly but admits to recent falls       Hand Dominance   Dominant Hand: Right    Extremity/Trunk Assessment   Upper Extremity Assessment Upper Extremity Assessment: Defer to OT evaluation RUE Deficits / Details: WFL ROM, 5/5 strength (bruising noted all over arm) RUE Sensation: WNL RUE Coordination: WNL LUE Deficits / Details: WFL ROM, 5/5 strength (bruising noted all over arm) LUE Sensation: WNL LUE Coordination: WNL    Lower Extremity Assessment Lower Extremity Assessment: Generalized weakness;RLE deficits/detail RLE: Unable to fully assess due to pain    Cervical / Trunk Assessment Cervical / Trunk Assessment: Normal  Communication   Communication: No difficulties  Cognition Arousal/Alertness: Awake/alert Behavior During Therapy: WFL for tasks assessed/performed Overall Cognitive Status: Within Functional Limits for tasks assessed                                          General Comments      Exercises     Assessment/Plan    PT Assessment Patient needs continued PT services  PT Problem List Decreased strength;Decreased activity tolerance;Decreased balance;Decreased range of motion;Decreased mobility;Decreased knowledge of use of DME;Other (comment)       PT Treatment Interventions DME instruction;Gait training;Stair training;Functional mobility training;Therapeutic activities;Patient/family education;Balance training;Therapeutic exercise    PT Goals (Current goals can be found in the Care Plan section)  Acute Rehab PT Goals Patient Stated Goal: Regain IND PT Goal Formulation: With patient Time For Goal Achievement: 09/14/21 Potential to Achieve Goals: Good    Frequency Min 3X/week   Barriers to discharge         Co-evaluation               AM-PAC PT "6 Clicks" Mobility  Outcome Measure Help needed turning from your back to your side while in a flat bed without using bedrails?: None Help needed moving from lying on your back to sitting on the side of a flat bed without using bedrails?: None Help needed moving to and from a bed to a chair (including a wheelchair)?: A Little Help needed standing up from a chair using your arms (e.g., wheelchair or bedside chair)?: A Little Help needed to walk in hospital room?: A Little Help needed climbing 3-5 steps with a railing? : A Little 6 Click Score: 20    End of Session Equipment Utilized During Treatment: Gait belt Activity Tolerance: Patient tolerated treatment well Patient left: in bed;with call bell/phone within reach Nurse Communication: Mobility status PT Visit Diagnosis: Unsteadiness on feet (R26.81);Muscle weakness (generalized) (M62.81)    Time: 4854-6270 PT Time Calculation (min) (ACUTE ONLY): 28 min   Charges:   PT Evaluation $PT Eval Low Complexity: 1 Low          [place] Pager [phone] Office [phone]   Anvika Gashi 09/14/2021, 11:46 AM

## 2021-09-14 NOTE — ED Notes (Signed)
Patient agitated that he hasn't had an update about his labs. Pt tried getting up from bed. Pt prompted to get back in bed. Pt assisted using a urinal.

## 2021-09-14 NOTE — ED Notes (Signed)
Helped pt use the urinal, pt unable to urinate.

## 2021-09-14 NOTE — ED Notes (Signed)
Spoke w/ pt's wife, expressing concerns about patient's confusing, he is telling family he is going to get discharged. Explained pt is still up for admission, wife requesting a sedative.

## 2021-09-14 NOTE — ED Notes (Signed)
Lunch provided.

## 2021-09-14 NOTE — ED Notes (Signed)
PT at bedside.

## 2021-09-14 NOTE — Progress Notes (Signed)
Patient arrived to 1407 via stretcher with daughter, VSS, TELE applied. Safety precautions reviewed with patient. Bed alarm activated. Will report to oncoming RN.

## 2021-09-15 ENCOUNTER — Encounter: Payer: Self-pay | Admitting: Internal Medicine

## 2021-09-15 ENCOUNTER — Other Ambulatory Visit: Payer: Self-pay

## 2021-09-15 DIAGNOSIS — C3491 Malignant neoplasm of unspecified part of right bronchus or lung: Secondary | ICD-10-CM | POA: Diagnosis not present

## 2021-09-15 DIAGNOSIS — Z20822 Contact with and (suspected) exposure to covid-19: Secondary | ICD-10-CM | POA: Diagnosis present

## 2021-09-15 DIAGNOSIS — N179 Acute kidney failure, unspecified: Secondary | ICD-10-CM | POA: Diagnosis present

## 2021-09-15 DIAGNOSIS — E7800 Pure hypercholesterolemia, unspecified: Secondary | ICD-10-CM | POA: Diagnosis not present

## 2021-09-15 DIAGNOSIS — Z66 Do not resuscitate: Secondary | ICD-10-CM | POA: Diagnosis not present

## 2021-09-15 DIAGNOSIS — E46 Unspecified protein-calorie malnutrition: Secondary | ICD-10-CM | POA: Diagnosis not present

## 2021-09-15 DIAGNOSIS — E860 Dehydration: Secondary | ICD-10-CM | POA: Diagnosis present

## 2021-09-15 DIAGNOSIS — N19 Unspecified kidney failure: Secondary | ICD-10-CM | POA: Diagnosis not present

## 2021-09-15 DIAGNOSIS — K219 Gastro-esophageal reflux disease without esophagitis: Secondary | ICD-10-CM | POA: Diagnosis present

## 2021-09-15 DIAGNOSIS — K5090 Crohn's disease, unspecified, without complications: Secondary | ICD-10-CM | POA: Diagnosis present

## 2021-09-15 DIAGNOSIS — C7971 Secondary malignant neoplasm of right adrenal gland: Secondary | ICD-10-CM | POA: Diagnosis present

## 2021-09-15 DIAGNOSIS — I10 Essential (primary) hypertension: Secondary | ICD-10-CM | POA: Diagnosis present

## 2021-09-15 DIAGNOSIS — Z515 Encounter for palliative care: Secondary | ICD-10-CM | POA: Diagnosis not present

## 2021-09-15 DIAGNOSIS — E2740 Unspecified adrenocortical insufficiency: Secondary | ICD-10-CM | POA: Diagnosis present

## 2021-09-15 DIAGNOSIS — I951 Orthostatic hypotension: Secondary | ICD-10-CM

## 2021-09-15 DIAGNOSIS — F17210 Nicotine dependence, cigarettes, uncomplicated: Secondary | ICD-10-CM | POA: Diagnosis present

## 2021-09-15 DIAGNOSIS — R339 Retention of urine, unspecified: Secondary | ICD-10-CM | POA: Diagnosis present

## 2021-09-15 DIAGNOSIS — D61818 Other pancytopenia: Secondary | ICD-10-CM | POA: Diagnosis not present

## 2021-09-15 DIAGNOSIS — C7931 Secondary malignant neoplasm of brain: Secondary | ICD-10-CM | POA: Diagnosis present

## 2021-09-15 DIAGNOSIS — R296 Repeated falls: Secondary | ICD-10-CM | POA: Diagnosis present

## 2021-09-15 DIAGNOSIS — W19XXXA Unspecified fall, initial encounter: Secondary | ICD-10-CM | POA: Diagnosis not present

## 2021-09-15 DIAGNOSIS — I739 Peripheral vascular disease, unspecified: Secondary | ICD-10-CM | POA: Diagnosis present

## 2021-09-15 DIAGNOSIS — E872 Acidosis, unspecified: Secondary | ICD-10-CM | POA: Diagnosis present

## 2021-09-15 DIAGNOSIS — D849 Immunodeficiency, unspecified: Secondary | ICD-10-CM | POA: Diagnosis present

## 2021-09-15 DIAGNOSIS — C7972 Secondary malignant neoplasm of left adrenal gland: Secondary | ICD-10-CM | POA: Diagnosis present

## 2021-09-15 DIAGNOSIS — I714 Abdominal aortic aneurysm, without rupture, unspecified: Secondary | ICD-10-CM | POA: Diagnosis present

## 2021-09-15 DIAGNOSIS — C7951 Secondary malignant neoplasm of bone: Secondary | ICD-10-CM | POA: Diagnosis present

## 2021-09-15 DIAGNOSIS — E441 Mild protein-calorie malnutrition: Secondary | ICD-10-CM | POA: Diagnosis present

## 2021-09-15 DIAGNOSIS — R531 Weakness: Secondary | ICD-10-CM | POA: Diagnosis present

## 2021-09-15 DIAGNOSIS — W1830XA Fall on same level, unspecified, initial encounter: Secondary | ICD-10-CM | POA: Diagnosis present

## 2021-09-15 LAB — COMPREHENSIVE METABOLIC PANEL
ALT: 9 U/L (ref 0–44)
AST: 14 U/L — ABNORMAL LOW (ref 15–41)
Albumin: 2.9 g/dL — ABNORMAL LOW (ref 3.5–5.0)
Alkaline Phosphatase: 39 U/L (ref 38–126)
Anion gap: 4 — ABNORMAL LOW (ref 5–15)
BUN: 26 mg/dL — ABNORMAL HIGH (ref 8–23)
CO2: 17 mmol/L — ABNORMAL LOW (ref 22–32)
Calcium: 8.3 mg/dL — ABNORMAL LOW (ref 8.9–10.3)
Chloride: 115 mmol/L — ABNORMAL HIGH (ref 98–111)
Creatinine, Ser: 1.05 mg/dL (ref 0.61–1.24)
GFR, Estimated: >60 mL/min (ref >60)
Glucose, Bld: 80 mg/dL (ref 70–99)
Potassium: 4.3 mmol/L (ref 3.5–5.1)
Sodium: 136 mmol/L (ref 135–145)
Total Bilirubin: 0.9 mg/dL (ref 0.3–1.2)
Total Protein: 4.9 g/dL — ABNORMAL LOW (ref 6.5–8.1)

## 2021-09-15 LAB — CBC WITH DIFFERENTIAL/PLATELET
Abs Immature Granulocytes: 0.02 10*3/uL (ref 0.00–0.07)
Basophils Absolute: 0 10*3/uL (ref 0.0–0.1)
Basophils Relative: 0 %
Eosinophils Absolute: 0 10*3/uL (ref 0.0–0.5)
Eosinophils Relative: 1 %
HCT: 30.2 % — ABNORMAL LOW (ref 39.0–52.0)
Hemoglobin: 9.7 g/dL — ABNORMAL LOW (ref 13.0–17.0)
Immature Granulocytes: 1 %
Lymphocytes Relative: 5 %
Lymphs Abs: 0.2 10*3/uL — ABNORMAL LOW (ref 0.7–4.0)
MCH: 31.2 pg (ref 26.0–34.0)
MCHC: 32.1 g/dL (ref 30.0–36.0)
MCV: 97.1 fL (ref 80.0–100.0)
Monocytes Absolute: 0.4 10*3/uL (ref 0.1–1.0)
Monocytes Relative: 11 %
Neutro Abs: 2.8 10*3/uL (ref 1.7–7.7)
Neutrophils Relative %: 82 %
Platelets: 95 10*3/uL — ABNORMAL LOW (ref 150–400)
RBC: 3.11 MIL/uL — ABNORMAL LOW (ref 4.22–5.81)
RDW: 15.6 % — ABNORMAL HIGH (ref 11.5–15.5)
WBC: 3.4 10*3/uL — ABNORMAL LOW (ref 4.0–10.5)
nRBC: 0 % (ref 0.0–0.2)

## 2021-09-15 LAB — PHOSPHORUS: Phosphorus: 2.8 mg/dL (ref 2.5–4.6)

## 2021-09-15 LAB — MAGNESIUM: Magnesium: 1.8 mg/dL (ref 1.7–2.4)

## 2021-09-15 MED ORDER — SODIUM BICARBONATE 8.4 % IV SOLN
INTRAVENOUS | Status: DC
  Filled 2021-09-15: qty 1000
  Filled 2021-09-15 (×3): qty 150
  Filled 2021-09-15: qty 1000
  Filled 2021-09-15: qty 150

## 2021-09-15 MED ORDER — SODIUM CHLORIDE 0.9 % IV BOLUS
1000.0000 mL | Freq: Once | INTRAVENOUS | Status: AC
  Administered 2021-09-15: 13:00:00 1000 mL via INTRAVENOUS

## 2021-09-15 MED ORDER — SODIUM CHLORIDE 0.9 % IV SOLN
INTRAVENOUS | Status: DC
Start: 2021-09-15 — End: 2021-09-15

## 2021-09-15 MED ORDER — MAGNESIUM SULFATE 2 GM/50ML IV SOLN
2.0000 g | Freq: Once | INTRAVENOUS | Status: AC
  Administered 2021-09-15: 10:00:00 2 g via INTRAVENOUS
  Filled 2021-09-15: qty 50

## 2021-09-15 NOTE — Consult Note (Signed)
°  Mental Capacity Assessment: I have evaluated the following areas to assess the Gregory Hodges's mental capacity regarding medical decision-making ability which pertains to competency to accept or refuse medical treatment.   The specific treatment or service in question is: refusing SNF.  Communication: The patient was able to clearly state preferred treatment options for his medical care at this time. The patient was able to decide that he does want to go to [place], dialysis treatment, however this fluctuates with  his moods. Factors that could compromise this communication process include: Neurocognitive symptoms, bladder retention, undergoing chemotherapy.   Understanding: The patient was able to recall information, link causal relationships, and process general probabilities regarding life situations and medical treatment scenarios "I want to know is it against the law to have cigarettes on this campus? ".  He was able to paraphrase his view of the current situation and his thoughts about it including, call health policy and contraband.  He is advised his cigarettes have been returned to his daughter and he will receive them after discharge.  He vocalizes understanding.  The patient did not present with memory impairments in memory, attention span, and or intelligence.  However nursing reports some repetition and questions, and some signs of memory impairment.    Appreciation: The patient was able to identify his current treatment option, was for rehabilitation at a skilled nursing facility.  He states there is no confusion.  He further reports "only confusion is my cigarettes being removed from me.  "  He denies any psychiatric symptoms, questions, and or concerns.  Rationalization: The patient was able to weigh risks and benefits and come to a conclusion congruent with patients perceived goals. Concerns regarding this category are: depression, acute/chronic encephalopathy,  electrolyte imbalances,  In conclusion, the patient is-not experiencing an acute medical scenario: Refusal skilled nursing facility.   Conclusion: At this time, there \\is  insufficient evidence to warrant removal of the patient's rights for medical decision-making.  He can clearly determine mental capacity for decision-making.  He was able to agree to skilled nursing facility from the beginning of the capacity evaluation.  He states his family is also in agreements with skilled nursing facility.  He apologizes for the confusion.  At this time, we can determine that the patient does have functional mental capacity for medical decision-making including the right to accept or refuse skilled nursing facility.  Patient's capacity evaluation was completed, he does have capacity to make medical decision.  He also endorses supportive family system who can assist in making decisions as well.  Capacity evaluation was limited due to patient's current irritability surrounding events dealing with cigarette removal.  Please see nursing note from [facility], regarding recent encounter for removal of cigarettes and contraband during this hospitalization encounter.  Psychiatry to sign off at this time.

## 2021-09-15 NOTE — TOC Initial Note (Addendum)
Transition of Care [facility]) - Initial/Assessment Note    Patient Details  Name: Gregory Hodges MRN: [id number] Date of Birth: 10/17/1940  Transition of Care [facility]) CM/SW Contact:    Leeroy Cha, RN Phone Number: 09/15/2021, 8:31 AM  Clinical Narrative:                  Transition of Care [facility]) Screening Note   Patient Details  Name: Gregory Hodges Date of Birth: [birth year]   Transition of Care [facility]) CM/SW Contact:    Leeroy Cha, RN Phone Number: 09/15/2021, 8:31 AM    Transition of Care Department [facility]) has reviewed patient and no TOC needs have been identified at this time. We will continue to monitor patient advancement through interdisciplinary progression rounds. If new patient transition needs arise, please place a TOC consult.  NOTE: had conversation with the iwfe.  Has been falling backward frequently at home.  Is worried about safety at home due to balance and weakness.  Pt consult requested.   Expected Discharge Plan: Home/Self Care Barriers to Discharge: Continued Medical Work up   Patient Goals and CMS Choice Patient states their goals for this hospitalization and ongoing recovery are:: to [facility] home CMS Medicare.gov Compare Post Acute Care list provided to:: Patient    Expected Discharge Plan and Services Expected Discharge Plan: Home/Self Care   Discharge Planning Services: CM Consult   Living arrangements for the past 2 months: Single Family Home                                      Prior Living Arrangements/Services Living arrangements for the past 2 months: Single Family Home Lives with:: Spouse Patient language and need for interpreter reviewed:: Yes Do you feel safe going back to the place where you live?: Yes            Criminal Activity/Legal Involvement Pertinent to Current Situation/Hospitalization: No - Comment as needed  Activities of Daily Living Home Assistive Devices/Equipment: Cane (specify quad or straight),  Eyeglasses, Dentures (specify type), Walker (specify type) ADL Screening (condition at time of admission) Patient's cognitive ability adequate to safely complete daily activities?: Yes Is the patient deaf or have difficulty hearing?: No Does the patient have difficulty seeing, even when wearing glasses/contacts?: No Does the patient have difficulty concentrating, remembering, or making decisions?: No Patient able to express need for assistance with ADLs?: Yes Does the patient have difficulty dressing or bathing?: No Independently performs ADLs?: Yes (appropriate for developmental age) Does the patient have difficulty walking or climbing stairs?: Yes Weakness of Legs: Both Weakness of Arms/Hands: None  Permission Sought/Granted                  Emotional Assessment Appearance:: Appears stated age Attitude/Demeanor/Rapport: Engaged Affect (typically observed): Calm Orientation: : Oriented to Place, Oriented to Self, Oriented to  Time, Oriented to Situation Alcohol / Substance Use: Not Applicable Psych Involvement: No (comment)  Admission diagnosis:  Weakness [R53.1] Fall [W19.XXXA] Generalized weakness [R53.1] Patient Active Problem List   Diagnosis Date Noted   Generalized weakness 09/13/2021   Fall at home, initial encounter 35/36/1443   Metabolic acidosis, normal anion gap (NAG) 09/13/2021   Protein calorie malnutrition ([place]) 09/13/2021   Dehydration 09/13/2021   Acute prerenal azotemia 09/13/2021   Hyperlipemia    Hypertension    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   Brain metastases ([place])  03/11/2021   Difficulty urinating 01/22/2021   Hypotension 01/22/2021   Port-A-Cath in place 12/31/2020   Small cell lung cancer, right ([place]) 12/03/2020   Encounter for antineoplastic chemotherapy 12/03/2020   Encounter for antineoplastic immunotherapy 12/03/2020   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   E coli bacteremia    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[facility])    Peripheral  vascular disease, unspecified ([place]) 10/09/2013   Yeast infection 01/26/2013   Atherosclerosis of native arteries of the extremities with intermittent claudication 03/21/2012   Wound drainage 01/13/2012   Hematuria 01/09/2011   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 08/20/2010   ACUTE AND SUBACUTE BACTERIAL ENDOCARDITIS 08/20/2010   AAA 08/20/2010   INF&INFLAM REACT DUE UNSPEC DEVICE IMPLANT&GRAFT 08/20/2010   PCP:  [name], MD Pharmacy:   [facility] - [address] [place] - [address] [place] [address] Phone: [phone] Fax: [phone]  [facility] [place] [place] [address] [place] [address] Phone: [phone] Fax: [phone]     Social Determinants of Health (SDOH) Interventions    Readmission Risk Interventions No flowsheet data found.

## 2021-09-15 NOTE — Progress Notes (Signed)
Physical Therapy Treatment Patient Details Name: Gregory Hodges MRN: [id number] DOB: 1940/11/17 Today's Date: 09/15/2021   History of Present Illness Gregory Hodges is a 80 y.o. 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 Pt's BP hypotensive in supine and pt with significant orthostatic drop with position change but reporting only mild symptoms. Unable to progress mobility to gait today. In standing EOB pt noted to have continued posterior lean and min assist needed to steady. Discussed concerns regarding home safety as pt requires physical assist and continues to be limited by weakness and now orthostasis.Pt only has assist from wife who is limited in physical assist she can provide. Given recurrent falls recommend pt discharge to [place] rehab at SNF to address balance and strength deficits prior to return home. Acute PT will continue to progress pt as able.     Recommendations for follow up therapy are one component of a multi-disciplinary discharge planning process, led by the attending physician.  Recommendations may be updated based on patient status, additional functional criteria and insurance authorization.  Follow Up Recommendations  Skilled nursing-short term rehab (<3 hours/day)     Assistance Recommended at Discharge Frequent or constant Supervision/Assistance  Equipment Recommendations  None recommended by PT    Recommendations for Other Services       Precautions / Restrictions Precautions Precautions: Fall Restrictions Weight Bearing Restrictions: No     Mobility  Bed Mobility Overal bed mobility: Needs Assistance Bed Mobility: Supine to Sit;Sit  to Supine     Supine to sit: Supervision;Min guard Sit to supine: Supervision   General bed mobility comments: pt taking increased time to sit up to EOB, min cues needed for sequencing with use of bed rail to pivot to EOB. supervision for safety with supine<>sit.    Transfers Overall transfer level: Needs assistance Equipment used: Rolling walker (2 wheels) Transfers: Sit to/from Stand Sit to Stand: Min guard;Min assist           General transfer comment: pt c/o slight dizziness, no diaphoresis or palor with sit<>stand. pt increasingly hypotensive with position change and returned to bed for RN to take maual BP. Pt required min guard/assist to steady and prevent posterior LOB with rise from EOB. pt using bil UE's for power up.    Ambulation/Gait               General Gait Details: unsafe to progress due to hypotension   Stairs             Wheelchair Mobility    Modified Rankin (Stroke Patients Only)       Balance Overall balance assessment: History of Falls;Needs assistance Sitting-balance support: No upper extremity supported;Feet supported Sitting balance-Leahy Scale: Good   Postural control: Posterior lean Standing balance support: Bilateral upper extremity supported Standing balance-Leahy Scale: Fair                              Cognition Arousal/Alertness: Awake/alert Behavior During Therapy: WFL for tasks assessed/performed Overall Cognitive Status: Within Functional Limits for tasks assessed  Exercises      General Comments        Pertinent Vitals/Pain Pain Assessment: No/denies pain    Home Living                          Prior Function            PT Goals (current goals can now be found in the care plan section) Acute Rehab PT Goals Patient Stated Goal: Regain IND PT Goal Formulation: With patient Time For Goal Achievement: 09/14/21 Potential  to Achieve Goals: Good Progress towards PT goals: Progressing toward goals    Frequency    Min 3X/week      PT Plan Current plan remains appropriate    Co-evaluation              AM-PAC PT "6 Clicks" Mobility   Outcome Measure  Help needed turning from your back to your side while in a flat bed without using bedrails?: A Little Help needed moving from lying on your back to sitting on the side of a flat bed without using bedrails?: A Little Help needed moving to and from a bed to a chair (including a wheelchair)?: A Little Help needed standing up from a chair using your arms (e.g., wheelchair or bedside chair)?: A Little Help needed to walk in hospital room?: A Little Help needed climbing 3-5 steps with a railing? : A Lot 6 Click Score: 17    End of Session Equipment Utilized During Treatment: Gait belt Activity Tolerance: Treatment limited secondary to medical complications (Comment) (orthostatic hypotension) Patient left: in bed;with call bell/phone within reach;with nursing/sitter in room;with family/visitor present Nurse Communication: Mobility status PT Visit Diagnosis: Unsteadiness on feet (R26.81);Muscle weakness (generalized) (M62.81)     Time: 0932-6712 PT Time Calculation (min) (ACUTE ONLY): 19 min  Charges:  $Therapeutic Activity: 8-22 mins                     Verner Mould, DPT Acute Rehabilitation Services Office [phone] Pager [phone]    Jacques Navy 09/15/2021, 12:52 PM

## 2021-09-15 NOTE — Progress Notes (Signed)
Pt's wife gave patient his bag of belongings. Pt got his cigarettes and lighter out of the pocket. RN asked pt to give them to her as his daughter at bedside stated, "He will smoke them". Pt refusing to give cigarettes and lighter to RN. Wife assisted RN and got cigarettes and lighter from pt. Pt upside that wife took them home on her way out. RN informed pt that he can have them when he goes home. Nicotine gum offered to pt, pt wearing nicotine patch. Pt's daughter stated he is a "really heavy smoker".   Vital signs re-checked after completion of bolus and now WNL. Psych here to see pt. Pt still asking about cigarettes.

## 2021-09-15 NOTE — Progress Notes (Signed)
Mobility Specialist - Progress Note    09/15/21 1123  Mobility  Activity Ambulated in hall  Level of Assistance Minimal assist, patient does 75% or more  Assistive Device Front wheel walker  Distance Ambulated (ft) 50 ft  Mobility Ambulated with assistance in hallway  Mobility Response Tolerated well  Mobility performed by Mobility specialist  $Mobility charge 1 Mobility   Upon entry pt was agreeable to mobilize and required Min A to sit at EOB. Pt used RW to ambulate 50 ft in hallway and required verbal cues to avoid drifting. Pt took 2 standing rest breaks to practice pursed breathing. Pt requested to return to bed after session and was left with call bell at side, bed alarm on, and family in room.   Pre-mobility: 66 HR During mobility: 112 HR Post-mobility: [place] Specialist Acute Rehabilitation Services Phone: [phone] 09/15/21, 11:25 AM

## 2021-09-15 NOTE — Progress Notes (Signed)
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  Patient had reported 1 to 2 weeks of generalized weakness and absence of any acute focal weakness.  He had generalized weakness with difficulty with independent completion of his ADLs with progressive difficulty ambulation resulting in multiple falls.  Yesterday's fall he struck the floor with his left hip and had sharp left nonradiating hip discomfort.  He was able to wear weight on the left lower extremity but had no exacerbation of his left hip.  The falls were not associate with any loss of consciousness and patient admits that he did not hit his head.  He is on daily aspirin and blood thinners at home and PT was placed yesterday by his outpatient provider along with a four-wheel walker although it has not been delivered yet.  Given his recurrent weakness and recurrent falls and brought into the hospital for observation.  Patient improved and PT OT recommending home health however patient's family had significant concerns for safe returning home given that patient's recurrent falls and because he remains somewhat altered.  Psychiatry was consulted for capacity evaluation.  Patient was given IV fluid hydration with improvement in his renal function.  He had underwent work-up in the ED and an EKG as well as an MRI of the brain which showed that the enhancing lesions in the right cerebral hemisphere, right frontal lobe and right medial temporal lobe  were no longer identified and there is no evidence of any intracranial metastatic disease.  The EDP discussed with on-call oncologist who recommended admission for generalized weakness.  He was given a liter normal saline bolus and then placed on continuous normal saline at 125 mL/hr and then reduce to 75 MLS per hour.  Upon reevaluation today patient was orthostatic and was given another normal saline bolus and TED hose.  Psychiatry evaluated and deemed the patient have capacity to make medical decisions.  PT OT reevaluating and now recommending SNF.  Patient is now agreeable to SNF.  His labs are improving and he continues to be significantly weak and orthostatic so we will repeat his orthostatic vital signs again in the morning continue maintenance IV fluid hydration for now.  Nutritionist evaluation still pending  Assessment & Plan:   Principal Problem:   Generalized weakness Active Problems:   Small cell lung cancer, right ([place])   Fall at home, initial encounter   Metabolic acidosis, normal anion gap (NAG)   Protein calorie malnutrition (HCC)   Dehydration   Acute prerenal azotemia   Hyperlipemia   Hypertension  Generalized weakness with recurrent falls and physical deconditioning -He has had 1 to 2 weeks of progressive generalized weakness and absence of any focal neurologic deficit -MRI brain performed as above with no acute ischemic infarct and showed improvement in his metastatic processes -Getting IV fluid hydration and his generalized weakness is likely multifactorial -Oncology was consulted and recommended observation in hospital -Patient is given PT OT consults which recommended home health initially but now recommending SNF for discharge  given how deconditioned the patient and because of the fall risk -Orthostatic vital signs were checked and patient was orthostatic and did drop so he was given another normal saline bolus and placed on maintenance IV fluid hydration as  below -TSH was normal -We will repeat labs in the a.m. -Prealbumin was normal -Nutrition consulted for poor p.o. intake -Continue monitor and trend and reevaluate in the morning and discussed with psych about his capacity  Recurrent falls with unwitnessed fall in the ED -Patient has had progressive generalized weakness and recurrent falls -MRI brain as above -Had a unwitnessed fall in the ED so we will obtain a stat head CT scan -He has metastatic disease with a consequence of going under chemotherapy.  Likely his falls have been in the setting of dehydration given his poor p.o. intake and so we will give him IV fluid hydration and continue and given another liter bolus -Check orthostatic vital signs and they were positive -Oncology will be checking on the patient -Continue with fall precautions and urinalysis was unremarkable -TSH was unremarkable -Resume IV fluid hydration and give another normal saline bolus.  He was changed his IV fluid hydration to a sodium bicarbonate drip -Follow-up on repeat head CT scan for fall and showed no acute intracranial abnormality  Agitation mild confusion, improved -On my exam he appeared to be alert and oriented x3 however wife and daughter states that he is not as baseline -He is holding onto his urine possibly so we will check a bladder scan -Follow-up and had CT scan -Psychiatry has been consulted for capacity evaluation and patient does have the capacity make medical decisions  Nongap metabolic acidosis -Continue IV fluid hydrations and change from normal saline to LR in the setting of hyperchloremia -Patient's CO2 is now 17, anion gap is 4, chloride level is 115 -I have added sodium bicarb tabs as well this has been stopped and changed to sodium bicarbonate drip at 75 MLS per hour -Continuing to monitor and trend and repeat CMP in a.m. with strict I's and O's   Protein calorie malnutrition with weight loss that is unintentional -Has been on  Megace and diagnosed with small cell cancer -Estimated body mass index is 20.83 kg/m as calculated from the following:   Height as of this encounter: 5' 9.5" (1.765 m).   Weight as of this encounter: 64.9 kg. -Nutrition was consulted and prealbumin was normal    Dehydration -Continue with IV fluid hydration as above -Strict I's and O's and daily weights -Has had poor p.o. intake -Continue IV fluid regimen as above and now on a sodium bicarbonate drip and given another normal saline bolus   AKI -Improving in the setting of IV fluid hydration -Patient's BUNs/creatinine went from 36/1.41 and is now 29/1.14 yesterday and today it is 26/1.05 -Continue with IV fluid hydration as above -Nephrotoxic medications, contrast dyes, hypotension and renally dose medications -Repeat CMP in the a.m.   Essential Hypertension -Continue to monitor blood pressures per protocol -Takes atenolol in outpatient setting but this was held given his borderline bradycardia -Blood pressure remains on the lower side and he had some orthostatic hypotension so we will continue to hold his home medications   Hyperlipidemia -Continue with home statin   GERD -Continue PPI   Normocytic anemia, chronic -Patient's baseline hemoglobin is within 10-13 -Patient BUN/creatinine went from 11.0/34.2 is now 11.6/35.5 and dropped today to 9.7/30.2 -Check anemia panel in the AM  -Continue to monitor for signs and symptoms bleeding;  currently no overt bleeding noted -Repeat CBC in the a.m.  Thrombocytopenia -Patient's platelet count went from 111 then trended down to 95 -Continue to monitor for signs and symptoms of bleeding; currently no overt bleeding noted -Repeat CBC in a.m.  Pancytopenia -All the patient's counts are down today and WBC is 3.4, hemoglobin/hematocrit is now 9.7/30.2, and platelet count is 95 -Continue to monitor and trend and repeat CBC in a.m.  Acute urinary retention -Had greater than 540 mL  yesterday so Foley catheter was placed -Getting IV fluid resuscitation and maintenance so we will continue Foley today and try a trial of void in the morning   Tobacco abuse -Smoking cessation counseling given and has been smoking greater then 30 years -Continue nicotine patch and nicotine gum  DVT prophylaxis: SCDs Code Status: FULL CODE Family Communication: Discussed with wife at bedside Disposition Plan: Pending determiniation if Home is a Safe Discharge Disposition for the patient   Status is: Observation  The patient remains OBS appropriate and will d/c before 2 midnights.  Consultants:  Psychiatry Medical Oncology   Procedures: MRI  Antimicrobials:  Anti-infectives (From admission, onward)    Start     Dose/Rate Route Frequency Ordered Stop   09/14/21 1000  sulfamethoxazole-trimethoprim (BACTRIM DS) 800-160 MG per tablet 1 tablet        1 tablet Oral 2 times daily 09/13/21 2107          Subjective: Seen and examined at bedside and he is awake and alert and oriented.  Complaining of some weakness still.  Agreeable to SNF at this time.  Denies any lightheadedness or dizziness.  Had to have a Foley catheter placed due to acute urinary tension yesterday.  We will do a trial of void in the morning.  No other concerns or complaints at this time.  Objective: Vitals:   09/15/21 1132 09/15/21 1144 09/15/21 1241 09/15/21 1350  BP:  (!) 84/46 (!) 86/53 101/64  Pulse:      Resp: 20 (!) 24 20 (!) 24  Temp:      TempSrc:      SpO2:      Weight:      Height:        Intake/Output Summary (Last 24 hours) at 09/15/2021 1509 Last data filed at 09/15/2021 1234 Gross per 24 hour  Intake 582.56 ml  Output 1600 ml  Net -1017.44 ml   Filed Weights   09/13/21 1454 09/15/21 0500  Weight: 65.3 kg 64.9 kg   Examination: Physical Exam:  Constitutional: Thin chronically ill-appearing Caucasian male currently no acute distress but appears fatigued Eyes: Lids and conjunctivae  normal, sclerae anicteric  ENMT: External Ears, Nose appear normal. Grossly normal hearing.  Neck: Appears normal, supple, no cervical masses, normal ROM, no appreciable thyromegaly; no appreciable JVD Respiratory: Diminished to auscultation bilaterally, no wheezing, rales, rhonchi or crackles. Normal respiratory effort and patient is not tachypenic. No accessory muscle use.  Unlabored breathing Cardiovascular: RRR, no murmurs / rubs / gallops. S1 and S2 auscultated. No extremity edema. .  Abdomen: Soft, non-tender, non-distended. Bowel sounds positive.  GU: Deferred. Musculoskeletal: No clubbing / cyanosis of digits/nails. No joint deformity upper and lower extremities.  Skin: No rashes, lesions, but does have some bruises on his arms and 2 small lacerations on the left arm.  No induration Neurologic: CN 2-12 grossly intact with no focal deficits.  Romberg sign cerebellar reflexes not assessed.  Psychiatric: Normal judgment and insight. Alert and oriented x 3. Normal mood  and appropriate affect.   Data Reviewed: I have personally reviewed following labs and imaging studies  CBC: Recent Labs  Lab 09/09/21 1127 09/13/21 1447 09/14/21 0520 09/15/21 0333  WBC 4.1 4.5 5.1 3.4*  NEUTROABS 3.4 3.7 4.3 2.8  HGB 11.9* 11.0* 11.6* 9.7*  HCT 35.6* 34.2* 35.5* 30.2*  MCV 93.4 96.1 96.7 97.1  PLT 69* 100* 111* 95*    Basic Metabolic Panel: Recent Labs  Lab 09/09/21 1127 09/13/21 1447 09/14/21 0520 09/15/21 0333  NA 140 140 139 136  K 4.9 4.6 4.6 4.3  CL 115* 115* 114* 115*  CO2 17* 18* 17* 17*  GLUCOSE 81 90 89 80  BUN 42* 36* 29* 26*  CREATININE 1.58* 1.41* 1.14 1.05  CALCIUM 8.7* 8.8* 8.7* 8.3*  MG  --   --  2.0 1.8  PHOS  --   --   --  2.8    GFR: Estimated Creatinine Clearance: 53.2 mL/min (by C-G formula based on SCr of 1.05 mg/dL). Liver Function Tests: Recent Labs  Lab 09/09/21 1127 09/13/21 1447 09/14/21 0520 09/15/21 0333  AST 9* 11* 14* 14*  ALT 6 9 8 9    ALKPHOS 53 46 45 39  BILITOT 0.6 0.5 1.0 0.9  PROT 6.0* 5.7* 6.1* 4.9*  ALBUMIN 3.7 3.5 3.6 2.9*    No results for input(s): LIPASE, AMYLASE in the last 168 hours. No results for input(s): AMMONIA in the last 168 hours. Coagulation Profile: Recent Labs  Lab 09/14/21 0520  INR 1.2    Cardiac Enzymes: Recent Labs  Lab 09/14/21 0520  CKTOTAL 135    BNP (last 3 results) No results for input(s): PROBNP in the last 8760 hours. HbA1C: No results for input(s): HGBA1C in the last 72 hours. CBG: No results for input(s): GLUCAP in the last 168 hours. Lipid Profile: No results for input(s): CHOL, HDL, LDLCALC, TRIG, CHOLHDL, LDLDIRECT in the last 72 hours. Thyroid Function Tests: Recent Labs    09/14/21 0520  TSH 0.664    Anemia Panel: No results for input(s): VITAMINB12, FOLATE, FERRITIN, TIBC, IRON, RETICCTPCT in the last 72 hours. Sepsis Labs: Recent Labs  Lab 09/13/21 1447  LATICACIDVEN 1.1    Recent Results (from the past 240 hour(s))  Culture, Urine     Status: None   Collection Time: 09/09/21 11:27 AM   Specimen: Urine, Clean Catch  Result Value Ref Range Status   Specimen Description   Final    URINE, CLEAN CATCH Performed at [facility], [place] [address] [place], [place] [address]    Special Requests   Final    NONE Performed at [facility], [place] [address], [place], [place] [address]    Culture   Final    NO GROWTH Performed at [facility], [place] [address], [place], [place] [address]    Report Status 09/10/2021 FINAL  Final  Resp Panel by RT-PCR (Flu A&B, Covid) Nasopharyngeal Swab     Status: None   Collection Time: 09/13/21  8:46 PM   Specimen: Nasopharyngeal Swab; Nasopharyngeal(NP) swabs in vial transport medium  Result Value Ref Range Status   SARS Coronavirus 2 by RT PCR NEGATIVE NEGATIVE Final    Comment: (NOTE) SARS-CoV-2 target nucleic acids are NOT DETECTED.  The SARS-CoV-2 RNA is  generally detectable in upper respiratory specimens during the acute phase of infection. The lowest concentration of SARS-CoV-2 viral copies this assay can detect is 138 copies/mL. A negative result does not preclude  SARS-Cov-2 infection and should not be used as the sole basis for treatment or other patient management decisions. A negative result may occur with  improper specimen collection/handling, submission of specimen other than nasopharyngeal swab, presence of viral mutation(s) within the areas targeted by this assay, and inadequate number of viral copies(<138 copies/mL). A negative result must be combined with clinical observations, patient history, and epidemiological information. The expected result is Negative.  Fact Sheet for Patients:  EntrepreneurPulse.com.au  Fact Sheet for Healthcare Providers:  IncredibleEmployment.be  This test is no t yet approved or cleared by the [place] FDA and  has been authorized for detection and/or diagnosis of SARS-CoV-2 by FDA under an Emergency Use Authorization (EUA). This EUA will remain  in effect (meaning this test can be used) for the duration of the COVID-19 declaration under Section 564(b)(1) of the Act, 21 U.S.C.section 360bbb-3(b)(1), unless the authorization is terminated  or revoked sooner.       Influenza A by PCR NEGATIVE NEGATIVE Final   Influenza B by PCR NEGATIVE NEGATIVE Final    Comment: (NOTE) The Xpert Xpress SARS-CoV-2/FLU/RSV plus assay is intended as an aid in the diagnosis of influenza from Nasopharyngeal swab specimens and should not be used as a sole basis for treatment. Nasal washings and aspirates are unacceptable for Xpert Xpress SARS-CoV-2/FLU/RSV testing.  Fact Sheet for Patients: EntrepreneurPulse.com.au  Fact Sheet for Healthcare Providers: IncredibleEmployment.be  This test is not yet approved or cleared by the [place] FDA and has been authorized for detection and/or diagnosis of SARS-CoV-2 by FDA under an Emergency Use Authorization (EUA). This EUA will remain in effect (meaning this test can be used) for the duration of the COVID-19 declaration under Section 564(b)(1) of the Act, 21 U.S.C. section 360bbb-3(b)(1), unless the authorization is terminated or revoked.  Performed at [facility], [place] [address], [place], [place] [address]      RN Pressure Injury Documentation:     Estimated body mass index is 20.83 kg/m as calculated from the following:   Height as of this encounter: 5' 9.5" (1.765 m).   Weight as of this encounter: 64.9 kg.  Malnutrition Type:   Malnutrition Characteristics:   Nutrition Interventions:    Radiology Studies: DG Pelvis 1-2 Views  Result Date: 09/13/2021 CLINICAL DATA:  Pain after fall. EXAM: PELVIS - 1-2 VIEW COMPARISON:  None. FINDINGS: There is no evidence of pelvic fracture or diastasis. No pelvic bone lesions are seen. IMPRESSION: Negative. Electronically Signed   By: Dorise Bullion III M.D.   On: 09/13/2021 17:56   DG Forearm Left  Result Date: 09/14/2021 CLINICAL DATA:  Fall on the floor. Abrasion on the posterior aspect of the left elbow EXAM: LEFT FOREARM - 2 VIEW COMPARISON:  None. FINDINGS: There is no evidence of fracture or other focal bone lesions. Soft tissues are unremarkable. IMPRESSION: No acute fracture or dislocation. Electronically Signed   By: Keane Police D.O.   On: 09/14/2021 17:27   CT HEAD WO CONTRAST (5MM)  Result Date: 09/14/2021 CLINICAL DATA:  Recent fall, initial encounter EXAM: CT HEAD WITHOUT CONTRAST TECHNIQUE: Contiguous axial images were obtained from the base of the skull through the vertex without intravenous contrast. COMPARISON:  09/09/2021 FINDINGS: Brain: No evidence of acute infarction, hemorrhage, hydrocephalus, extra-axial collection or mass lesion/mass effect. Chronic atrophic and ischemic  changes are noted and stable from the prior exam. Vascular: No hyperdense vessel or unexpected calcification. Skull: Normal. Negative for fracture or focal lesion. Sinuses/Orbits: No  acute finding. Other: None. IMPRESSION: Chronic atrophic and ischemic changes without acute abnormality. Electronically Signed   By: Inez Catalina M.D.   On: 09/14/2021 18:13   MR Brain W and Wo Contrast  Result Date: 09/13/2021 CLINICAL DATA:  Weakness and fatigue, history of lung cancer EXAM: MRI HEAD WITHOUT AND WITH CONTRAST TECHNIQUE: Multiplanar, multiecho pulse sequences of the brain and surrounding structures were obtained without and with intravenous contrast. CONTRAST:  41m GADAVIST GADOBUTROL 1 MMOL/ML IV SOLN COMPARISON:  CT head 09/09/2021, MR head 05/16/2021 FINDINGS: Brain: There is no evidence of acute intracranial hemorrhage, extra-axial fluid collection, or acute infarct. There is mild-to-moderate global parenchymal volume loss. Patchy FLAIR signal abnormality throughout the subcortical and periventricular white matter likely reflects sequela of moderate chronic white matter microangiopathy. Foci of SWI signal dropout in the left frontal lobe anteriorly and left temporal lobe laterally are unchanged consistent with old blood products probably related to prior metastatic disease. The previously seen enhancing lesion in the right cerebellar hemisphere is no longer identified. SWI signal dropout is seen in this location consistent with old blood products related to the previously metastatic lesion. The previously seen enhancing lesion in the medial right temporal lobe is also no longer identified. There was likely a lesion n in the medial right frontal lobe on the prior study which is also no longer present. There is no evidence of active intracranial metastatic disease on the current study. Vascular: Normal flow voids. Skull and upper cervical spine: Normal marrow signal. Sinuses/Orbits: The paranasal sinuses are  clear. The globes and orbits are unremarkable. Other: None. IMPRESSION: 1. Previously seen enhancing lesions in the right cerebellar hemisphere, right frontal lobe, and right medial temporal lobe are no longer identified. No evidence of active intracranial metastatic disease on the current study. 2. No other acute intracranial pathology. 3. Unchanged global parenchymal volume loss and moderate chronic white matter microangiopathy. Electronically Signed   By: PValetta MoleM.D.   On: 09/13/2021 19:04   DG Chest Port 1 View  Result Date: 09/13/2021 CLINICAL DATA:  Altered level of consciousness. EXAM: PORTABLE CHEST 1 VIEW COMPARISON:  Chest x-ray 08/29/2021. FINDINGS: Right chest port catheter tip projects over the mid SVC, unchanged. The cardiomediastinal silhouette is stable and within normal limits. Large hiatal hernia is again noted. The lungs are clear. There is no pleural effusion or pneumothorax. No acute fractures are seen. IMPRESSION: 1. No active disease. 2. Stable large hiatal hernia. Electronically Signed   By: ARonney AstersM.D.   On: 09/13/2021 15:17   DG Hip Unilat W or Wo Pelvis 2-3 Views Left  Result Date: 09/13/2021 CLINICAL DATA:  Pain with weight-bearing. EXAM: DG HIP (WITH OR WITHOUT PELVIS) 2-3V LEFT COMPARISON:  None. FINDINGS: There is no evidence of hip fracture or dislocation. Mild degenerative changes are noted in the form of acetabular sclerosis. Marked severity vascular calcification is seen. IMPRESSION: 1. No acute fracture or dislocation. 2. Mild degenerative changes. Electronically Signed   By: TVirgina NorfolkM.D.   On: 09/13/2021 19:48    Scheduled Meds:  Chlorhexidine Gluconate Cloth  6 each Topical Daily   megestrol  400 mg Oral BID   pantoprazole  40 mg Oral Daily   rosuvastatin  10 mg Oral QPM   sulfamethoxazole-trimethoprim  1 tablet Oral BID   Continuous Infusions:  sodium bicarbonate 150 mEq in D5W infusion 75 mL/hr at 09/15/21 1131     LOS: 0 days    OKerney Elbe DO [facility]  PAGER is on AMION  If 7PM-7AM, please contact night-coverage [email or web address]

## 2021-09-15 NOTE — Progress Notes (Signed)
Physical Therapy here to see patient. Assisted with taking orthostatic VS which were positive. Pt's BP how, verified by manual BP. Paged Dr. Alfredia Ferguson for orders. Pt c/o dizziness when sitting up and standing. Pt returned back to bed, fall precautions in place, WCTM.

## 2021-09-16 DIAGNOSIS — N19 Unspecified kidney failure: Secondary | ICD-10-CM | POA: Diagnosis not present

## 2021-09-16 DIAGNOSIS — W19XXXA Unspecified fall, initial encounter: Secondary | ICD-10-CM | POA: Diagnosis not present

## 2021-09-16 DIAGNOSIS — E860 Dehydration: Secondary | ICD-10-CM | POA: Diagnosis not present

## 2021-09-16 DIAGNOSIS — R531 Weakness: Secondary | ICD-10-CM | POA: Diagnosis not present

## 2021-09-16 LAB — CBC WITH DIFFERENTIAL/PLATELET
Abs Immature Granulocytes: 0.01 10*3/uL (ref 0.00–0.07)
Basophils Absolute: 0 10*3/uL (ref 0.0–0.1)
Basophils Relative: 0 %
Eosinophils Absolute: 0 10*3/uL (ref 0.0–0.5)
Eosinophils Relative: 1 %
HCT: 27.7 % — ABNORMAL LOW (ref 39.0–52.0)
Hemoglobin: 9.2 g/dL — ABNORMAL LOW (ref 13.0–17.0)
Immature Granulocytes: 0 %
Lymphocytes Relative: 5 %
Lymphs Abs: 0.1 10*3/uL — ABNORMAL LOW (ref 0.7–4.0)
MCH: 31.5 pg (ref 26.0–34.0)
MCHC: 33.2 g/dL (ref 30.0–36.0)
MCV: 94.9 fL (ref 80.0–100.0)
Monocytes Absolute: 0.3 10*3/uL (ref 0.1–1.0)
Monocytes Relative: 10 %
Neutro Abs: 2.5 10*3/uL (ref 1.7–7.7)
Neutrophils Relative %: 84 %
Platelets: 87 10*3/uL — ABNORMAL LOW (ref 150–400)
RBC: 2.92 MIL/uL — ABNORMAL LOW (ref 4.22–5.81)
RDW: 15.5 % (ref 11.5–15.5)
WBC: 3 10*3/uL — ABNORMAL LOW (ref 4.0–10.5)
nRBC: 0 % (ref 0.0–0.2)

## 2021-09-16 LAB — COMPREHENSIVE METABOLIC PANEL
ALT: 8 U/L (ref 0–44)
AST: 13 U/L — ABNORMAL LOW (ref 15–41)
Albumin: 2.7 g/dL — ABNORMAL LOW (ref 3.5–5.0)
Alkaline Phosphatase: 37 U/L — ABNORMAL LOW (ref 38–126)
Anion gap: 5 (ref 5–15)
BUN: 19 mg/dL (ref 8–23)
CO2: 23 mmol/L (ref 22–32)
Calcium: 7.7 mg/dL — ABNORMAL LOW (ref 8.9–10.3)
Chloride: 108 mmol/L (ref 98–111)
Creatinine, Ser: 1.24 mg/dL (ref 0.61–1.24)
GFR, Estimated: 60 mL/min — ABNORMAL LOW (ref >60)
Glucose, Bld: 87 mg/dL (ref 70–99)
Potassium: 3.6 mmol/L (ref 3.5–5.1)
Sodium: 136 mmol/L (ref 135–145)
Total Bilirubin: 0.7 mg/dL (ref 0.3–1.2)
Total Protein: 4.6 g/dL — ABNORMAL LOW (ref 6.5–8.1)

## 2021-09-16 LAB — PHOSPHORUS: Phosphorus: 2.7 mg/dL (ref 2.5–4.6)

## 2021-09-16 LAB — MAGNESIUM: Magnesium: 1.9 mg/dL (ref 1.7–2.4)

## 2021-09-16 MED ORDER — NICOTINE 21 MG/24HR TD PT24
21.0000 mg | MEDICATED_PATCH | Freq: Every day | TRANSDERMAL | Status: DC
  Administered 2021-09-16 – 2021-09-22 (×7): 21 mg via TRANSDERMAL
  Filled 2021-09-16 (×7): qty 1

## 2021-09-16 NOTE — NC FL2 (Signed)
[place] LEVEL OF CARE SCREENING TOOL     IDENTIFICATION  Patient Name: Gregory Hodges Birthdate: 09-22-1941 Sex: male Admission Date (Current Location): 09/13/2021  [facility] and [place] Number:  Herbalist and Address:  [facility],  [place], [place]      Provider Number: [id number]  Attending Physician Name and Address:  Kerney Elbe, DO  Relative Name and Phone Number:  O[place], [place] [phone]  [phone]    Current Level of Care: Hospital Recommended Level of Care: [place] Prior Approval Number:    Date Approved/Denied:   PASRR Number: [id number]  Discharge Plan: SNF    Current Diagnoses: Patient Active Problem List   Diagnosis Date Noted   Generalized weakness 09/13/2021   Fall at home, initial encounter 41/66/0630   Metabolic acidosis, normal anion gap (NAG) 09/13/2021   Protein calorie malnutrition ([place]) 09/13/2021   Dehydration 09/13/2021   Acute prerenal azotemia 09/13/2021   Hyperlipemia    Hypertension    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   Brain metastases ([place]) 03/11/2021   Difficulty urinating 01/22/2021   Hypotension 01/22/2021   Port-A-Cath in place 12/31/2020   Small cell lung cancer, 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   Encounter for antineoplastic chemotherapy 12/03/2020   Encounter for antineoplastic immunotherapy 12/03/2020   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   E coli bacteremia    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HCC)    Peripheral vascular disease, unspecified ([place]) 10/09/2013   Yeast infection 01/26/2013   Atherosclerosis of native arteries of the extremities with intermittent claudication 03/21/2012   Wound drainage 01/13/2012   Hematuria 01/09/2011   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 08/20/2010   ACUTE AND SUBACUTE BACTERIAL ENDOCARDITIS 08/20/2010   AAA 08/20/2010   INF&INFLAM REACT DUE UNSPEC DEVICE IMPLANT&GRAFT 08/20/2010    Orientation RESPIRATION  BLADDER Height & Weight     Self, Time, Situation, Place  Normal External catheter Weight: 145 lb 15.1 oz (66.2 kg) Height:  5' 9.5" (176.5 cm)  BEHAVIORAL SYMPTOMS/MOOD NEUROLOGICAL BOWEL NUTRITION STATUS      Continent Diet (Regular)  AMBULATORY STATUS COMMUNICATION OF NEEDS Skin   Limited Assist Verbally Normal                       Personal Care Assistance Level of Assistance  Bathing, Feeding, Dressing Bathing Assistance: Limited assistance Feeding assistance: Independent Dressing Assistance: Limited assistance     Functional Limitations Info  Sight, Speech, Hearing Sight Info: Adequate Hearing Info: Adequate Speech Info: Adequate    SPECIAL CARE FACTORS FREQUENCY  PT (By licensed PT), OT (By licensed OT)     PT Frequency: Minimum 5x a week OT Frequency: Minimum 5x a week            Contractures Contractures Info: Not present    Additional Factors Info  Code Status, Allergies Code Status Info: Full Code Allergies Info: Oxycodone   Codeine   Lisinopril   Oxycodone Hcl           Current Medications (09/16/2021):  This is the current hospital active medication list Current Facility-Administered Medications  Medication Dose Route Frequency Provider Last Rate Last Admin   acetaminophen (TYLENOL) tablet 650 mg  650 mg Oral Q6H PRN Howerter, Justin B, DO   650 mg at 09/16/21 0803   Or   acetaminophen (TYLENOL) suppository 650 mg  650 mg Rectal Q6H PRN Howerter, Justin B, DO       Chlorhexidine Gluconate  Cloth 2 % PADS 6 each  6 each Topical Daily Raiford Noble [place], [place]   6 each at 09/16/21 0804   megestrol (MEGACE) 400 MG/10ML suspension 400 mg  400 mg Oral BID Howerter, Justin B, DO   400 mg at 09/16/21 5102   nicotine (NICODERM CQ - dosed in mg/24 hours) patch 21 mg  21 mg Transdermal Daily Raiford Noble [place], DO   21 mg at 09/16/21 1247   nicotine polacrilex (NICORETTE) gum 2 mg  2 mg Oral PRN Raiford Noble Latif, DO   2 mg at 09/15/21 1019    ondansetron (ZOFRAN) injection 4 mg  4 mg Intravenous Q6H PRN Howerter, Justin B, DO       pantoprazole (PROTONIX) EC tablet 40 mg  40 mg Oral Daily Howerter, Justin B, DO   40 mg at 09/16/21 0804   rosuvastatin (CRESTOR) tablet 10 mg  10 mg Oral QPM Howerter, Justin B, DO   10 mg at 09/15/21 1751   sodium bicarbonate 150 mEq in dextrose 5 % 1,150 mL infusion   Intravenous Continuous Raiford Noble [place], DO 75 mL/hr at 09/16/21 0434 New Bag at 09/16/21 0434   sodium chloride flush (NS) 0.9 % injection 10-40 mL  10-40 mL Intracatheter PRN Raiford Noble Latif, DO       sulfamethoxazole-trimethoprim (BACTRIM DS) 800-160 MG per tablet 1 tablet  1 tablet Oral BID Howerter, Justin B, DO   1 tablet at 09/16/21 0803     Discharge Medications: Please see discharge summary for a list of discharge medications.  Relevant Imaging Results:  Relevant Lab Results:   Additional Information SSN [id number]  Ross Ludwig, LCSW

## 2021-09-16 NOTE — Progress Notes (Signed)
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  Patient had reported 1 to 2 weeks of generalized weakness and absence of any acute focal weakness.  He had generalized weakness with difficulty with independent completion of his ADLs with progressive difficulty ambulation resulting in multiple falls.  Yesterday's fall he struck the floor with his left hip and had sharp left nonradiating hip discomfort.  He was able to wear weight on the left lower extremity but had no exacerbation of his left hip.  The falls were not associate with any loss of consciousness and patient admits that he did not hit his head.  He is on daily aspirin and blood thinners at home and PT was placed yesterday by his outpatient provider along with a four-wheel walker although it has not been delivered yet.  Given his recurrent weakness and recurrent falls and brought into the hospital for observation.  Patient improved and PT OT recommending home health however patient's family had significant concerns for safe returning home given that patient's recurrent falls and because he remains somewhat altered.  Psychiatry was consulted for capacity evaluation.  Patient was given IV fluid hydration with improvement in his renal function.  He had underwent work-up in the ED and an EKG as well as an MRI of the brain which showed that the enhancing lesions in the right cerebral hemisphere, right frontal lobe and right medial temporal lobe  were no longer identified and there is no evidence of any intracranial metastatic disease.  The EDP discussed with on-call oncologist who recommended admission for generalized weakness.  He was given a liter normal saline bolus and then placed on continuous normal saline at 125 mL/hr and then reduce to 75 MLS per hour.  Upon reevaluation today patient was orthostatic and was given another normal saline bolus and TED hose.  Psychiatry evaluated and deemed the patient have capacity to make medical decisions.  PT OT reevaluating and now recommending SNF.  Patient is now agreeable to SNF.  His labs are improving and he continues to be significantly weak and orthostatic so we will repeat his orthostatic vital signs again in the morning continue maintenance IV fluid hydration for now.  Nutritionist evaluation still pending  Patient's renal function is relatively stable and his metabolic acidosis is improved  Assessment & Plan:   Principal Problem:   Generalized weakness Active Problems:   Small cell lung cancer, right ([place])   Fall at home, initial encounter   Metabolic acidosis, normal anion gap (NAG)   Protein calorie malnutrition (HCC)   Dehydration   Acute prerenal azotemia   Hyperlipemia   Hypertension  Generalized weakness with recurrent falls and physical deconditioning -He has had 1 to 2 weeks of progressive generalized weakness and absence of any focal neurologic deficit -MRI brain performed as above with no acute ischemic infarct and showed improvement in his metastatic processes -Getting IV fluid hydration and his generalized weakness is likely multifactorial -Oncology was consulted and recommended observation in hospital -Patient is given PT  OT consults which recommended home health initially but now recommending SNF for discharge given how deconditioned the patient and because of the fall risk -Orthostatic vital signs were checked and patient was orthostatic and did drop so he was  given another normal saline bolus and placed on maintenance IV fluid hydration as below -TSH was normal -We will repeat labs in the a.m. -Prealbumin was normal but his albumin level has now trended downward to -Nutrition consulted for poor p.o. intake -Continue monitor and trend and reevaluate in the morning and discussed with psych about his capacity  Recurrent falls with unwitnessed fall in the ED -Patient has had progressive generalized weakness and recurrent falls -MRI brain as above -Had a unwitnessed fall in the ED so we will obtain a stat head CT scan -He has metastatic disease with a consequence of going under chemotherapy.  Likely his falls have been in the setting of dehydration given his poor p.o. intake and so we will give him IV fluid hydration and continue and given another liter bolus -Check orthostatic vital signs and they were positive -Oncology will be checking on the patient -Continue with fall precautions and urinalysis was unremarkable -TSH was unremarkable -Resume IV fluid hydration and give another normal saline bolus.  He was changed his IV fluid hydration to a sodium bicarbonate drip -Follow-up on repeat head CT scan for fall and showed no acute intracranial abnormality -PT OT now recommending SNF and he is agreeable  Agitation mild confusion, improved -On my exam he appeared to be alert and oriented x3 however wife and daughter states that he is not as baseline -He is holding onto his urine possibly so we will check a bladder scan -Follow-up and had CT scan -Psychiatry has been consulted for capacity evaluation and patient does have the capacity make medical decisions -We will continue to monitor closely  [place] metabolic acidosis -Continue IV fluid hydrations and change from normal saline to LR in the setting of hyperchloremia -Patient's CO2 was now 17, anion gap was 4, chloride level was 115; now his anion gap is 5, chloride level is 108 and CO2 is  23 -Continuing sodium bicarbonate infusion for now -Continuing to monitor and trend and repeat CMP in a.m. with strict I's and O's   Protein calorie malnutrition with weight loss that is unintentional -Has been on Megace and diagnosed with small cell cancer -Estimated body mass index is 21.24 kg/m as calculated from the following:   Height as of this encounter: 5' 9.5" (1.765 m).   Weight as of this encounter: 66.2 kg. -Nutrition was consulted and prealbumin was normal    Dehydration -Continue with IV fluid hydration as above -Strict I's and O's and daily weights -Has had poor p.o. intake -Continue IV fluid regimen as above and now on a sodium bicarbonate drip and given another normal saline bolus   AKI -Improving in the setting of IV fluid hydration -Patient's BUNs/creatinine went from 36/1.41 -> 29/1.14 -> -> 26/1.05 -> and is now 19/1.24 -Continue with IV fluid hydration as above -Nephrotoxic medications, contrast dyes, hypotension and renally dose medications -Repeat CMP in the a.m.   Essential Hypertension -Continue to monitor blood pressures per protocol -Takes atenolol in outpatient setting but this was held given his borderline bradycardia -Blood pressure remains on the lower side and he had some orthostatic hypotension so we will continue to hold his home medications -Blood pressure reading was 109/60   Hyperlipidemia -Continue with home statin   GERD -Continue PPI  Normocytic anemia, chronic -Patient's baseline hemoglobin is within 10-13 -Patient BUN/creatinine went from 11.0/34.2 is now 11.6/35.5 and dropped today to 9.7/30.2 -Check anemia panel in the AM  -Continue to monitor for signs and symptoms bleeding; currently no overt bleeding noted -Repeat CBC in the a.m.  Thrombocytopenia -Patient's platelet count went from 111 -> 95 -> 87 -Continue to monitor for signs and symptoms of bleeding; currently no overt bleeding noted -Repeat CBC in  a.m.  Pancytopenia -All the patient's counts are down today and WBC is 3.0, hemoglobin/hematocrit is now 9.2/27.7, and platelet count is 87 -Continue to monitor and trend and repeat CBC in a.m.  Acute urinary retention -Had greater than 540 mL yesterday so Foley catheter was placed -Getting IV fluid resuscitation and maintenance  -Foley inserted and try a trial of void in the morning   Tobacco abuse -Smoking cessation counseling given and has been smoking greater then 30 years -Continue nicotine patch and nicotine gum  DVT prophylaxis: SCDs Code Status: FULL CODE Family Communication: Discussed with wife at bedside Disposition Plan: PT/OT recommending SNF  Status is: Observation  The patient remains OBS appropriate and will d/c before 2 midnights.  Consultants:  Psychiatry Medical Oncology   Procedures: MRI  Antimicrobials:  Anti-infectives (From admission, onward)    Start     Dose/Rate Route Frequency Ordered Stop   09/14/21 1000  sulfamethoxazole-trimethoprim (BACTRIM DS) 800-160 MG per tablet 1 tablet        1 tablet Oral 2 times daily 09/13/21 2107          Subjective: Seen and examined at bedside and is doing okay.  Denied any chest pain or shortness of breath.  Lab values are improving.  Still on the weaker side.  Agreeable to SNF.  No other concerns or complaints at this time.  Objective: Vitals:   09/16/21 0246 09/16/21 0536 09/16/21 0539 09/16/21 1238  BP: (!) 127/97 119/61  109/60  Pulse: 72 (!) 59  63  Resp: 18 17  16   Temp: 98.5 F (36.9 C) 97.9 F (36.6 C)  98.6 F (37 C)  TempSrc: Oral Oral  Oral  SpO2: 94% 97%  96%  Weight:   66.2 kg   Height:        Intake/Output Summary (Last 24 hours) at 09/16/2021 1505 Last data filed at 09/16/2021 1209 Gross per 24 hour  Intake 1591.25 ml  Output 2375 ml  Net -783.75 ml    Filed Weights   09/13/21 1454 09/15/21 0500 09/16/21 0539  Weight: 65.3 kg 64.9 kg 66.2 kg   Examination: Physical  Exam:  Constitutional: Thin chronically ill-appearing Caucasian male currently in no acute distress still appears little fatigued and was little agitated today Eyes: Lids and conjunctive are normal ENMT: External Ears, Nose appear normal. Grossly normal hearing.  Neck: Appears normal, supple, no cervical masses, normal ROM, no appreciable thyromegaly; no appreciable JVD Respiratory: Diminished to auscultation bilaterally, no wheezing, rales, rhonchi or crackles. Normal respiratory effort and patient is not tachypenic. No accessory muscle use.  Unlabored breathing Cardiovascular: RRR, no murmurs / rubs / gallops. S1 and S2 auscultated. No extremity edema.  Abdomen: Soft, non-tender, non-distended. Bowel sounds positive.  GU: Deferred.  Has a Foley catheter in place Musculoskeletal: No clubbing / cyanosis of digits/nails. No joint deformity upper and lower extremities. Skin: No rashes, lesions, ulcers but does have some bruising noted and has 2 small lacerations on left arm. No induration; Warm and dry.  Neurologic: CN 2-12 grossly intact  with no focal deficits. Romberg sign and cerebellar reflexes not assessed.  Psychiatric: Normal judgment and insight. Alert and oriented x 3. Normal mood and appropriate affect.   Data Reviewed: I have personally reviewed following labs and imaging studies  CBC: Recent Labs  Lab 09/13/21 1447 09/14/21 0520 09/15/21 0333 09/16/21 1142  WBC 4.5 5.1 3.4* 3.0*  NEUTROABS 3.7 4.3 2.8 2.5  HGB 11.0* 11.6* 9.7* 9.2*  HCT 34.2* 35.5* 30.2* 27.7*  MCV 96.1 96.7 97.1 94.9  PLT 100* 111* 95* 87*    Basic Metabolic Panel: Recent Labs  Lab 09/13/21 1447 09/14/21 0520 09/15/21 0333 09/16/21 1142  NA 140 139 136 136  K 4.6 4.6 4.3 3.6  CL 115* 114* 115* 108  CO2 18* 17* 17* 23  GLUCOSE 90 89 80 87  BUN 36* 29* 26* 19  CREATININE 1.41* 1.14 1.05 1.24  CALCIUM 8.8* 8.7* 8.3* 7.7*  MG  --  2.0 1.8 1.9  PHOS  --   --  2.8 2.7    GFR: Estimated  Creatinine Clearance: 46 mL/min (by C-G formula based on SCr of 1.24 mg/dL). Liver Function Tests: Recent Labs  Lab 09/13/21 1447 09/14/21 0520 09/15/21 0333 09/16/21 1142  AST 11* 14* 14* 13*  ALT 9 8 9 8   ALKPHOS 46 45 39 37*  BILITOT 0.5 1.0 0.9 0.7  PROT 5.7* 6.1* 4.9* 4.6*  ALBUMIN 3.5 3.6 2.9* 2.7*    No results for input(s): LIPASE, AMYLASE in the last 168 hours. No results for input(s): AMMONIA in the last 168 hours. Coagulation Profile: Recent Labs  Lab 09/14/21 0520  INR 1.2    Cardiac Enzymes: Recent Labs  Lab 09/14/21 0520  CKTOTAL 135    BNP (last 3 results) No results for input(s): PROBNP in the last 8760 hours. HbA1C: No results for input(s): HGBA1C in the last 72 hours. CBG: No results for input(s): GLUCAP in the last 168 hours. Lipid Profile: No results for input(s): CHOL, HDL, LDLCALC, TRIG, CHOLHDL, LDLDIRECT in the last 72 hours. Thyroid Function Tests: Recent Labs    09/14/21 0520  TSH 0.664    Anemia Panel: No results for input(s): VITAMINB12, FOLATE, FERRITIN, TIBC, IRON, RETICCTPCT in the last 72 hours. Sepsis Labs: Recent Labs  Lab 09/13/21 1447  LATICACIDVEN 1.1    Recent Results (from the past 240 hour(s))  Culture, Urine     Status: None   Collection Time: 09/09/21 11:27 AM   Specimen: Urine, Clean Catch  Result Value Ref Range Status   Specimen Description   Final    URINE, CLEAN CATCH Performed at [facility], [place] [address], [place], [place] [address]    Special Requests   Final    NONE Performed at [facility], [place] [address] [place], [place] [address]    Culture   Final    NO GROWTH Performed at [facility], [place] [address], [place], [place] [address]    Report Status 09/10/2021 FINAL  Final  Resp Panel by RT-PCR (Flu A&B, Covid) Nasopharyngeal Swab     Status: None   Collection Time: 09/13/21  8:46 PM   Specimen: Nasopharyngeal Swab;  Nasopharyngeal(NP) swabs in vial transport medium  Result Value Ref Range Status   SARS Coronavirus 2 by RT PCR NEGATIVE NEGATIVE Final    Comment: (NOTE) SARS-CoV-2 target nucleic acids are NOT DETECTED.  The SARS-CoV-2 RNA is generally detectable in upper respiratory specimens during the acute phase of infection.  The lowest concentration of SARS-CoV-2 viral copies this assay can detect is 138 copies/mL. A negative result does not preclude SARS-Cov-2 infection and should not be used as the sole basis for treatment or other patient management decisions. A negative result may occur with  improper specimen collection/handling, submission of specimen other than nasopharyngeal swab, presence of viral mutation(s) within the areas targeted by this assay, and inadequate number of viral copies(<138 copies/mL). A negative result must be combined with clinical observations, patient history, and epidemiological information. The expected result is Negative.  Fact Sheet for Patients:  EntrepreneurPulse.com.au  Fact Sheet for Healthcare Providers:  IncredibleEmployment.be  This test is no t yet approved or cleared by the [place] FDA and  has been authorized for detection and/or diagnosis of SARS-CoV-2 by FDA under an Emergency Use Authorization (EUA). This EUA will remain  in effect (meaning this test can be used) for the duration of the COVID-19 declaration under Section 564(b)(1) of the Act, 21 U.S.C.section 360bbb-3(b)(1), unless the authorization is terminated  or revoked sooner.       Influenza A by PCR NEGATIVE NEGATIVE Final   Influenza B by PCR NEGATIVE NEGATIVE Final    Comment: (NOTE) The Xpert Xpress SARS-CoV-2/FLU/RSV plus assay is intended as an aid in the diagnosis of influenza from Nasopharyngeal swab specimens and should not be used as a sole basis for treatment. Nasal washings and aspirates are unacceptable for Xpert Xpress  SARS-CoV-2/FLU/RSV testing.  Fact Sheet for Patients: EntrepreneurPulse.com.au  Fact Sheet for Healthcare Providers: IncredibleEmployment.be  This test is not yet approved or cleared by the [place] FDA and has been authorized for detection and/or diagnosis of SARS-CoV-2 by FDA under an Emergency Use Authorization (EUA). This EUA will remain in effect (meaning this test can be used) for the duration of the COVID-19 declaration under Section 564(b)(1) of the Act, 21 U.S.C. section 360bbb-3(b)(1), unless the authorization is terminated or revoked.  Performed at [facility], [place] [address], [place], [place] [address]      RN Pressure Injury Documentation:     Estimated body mass index is 21.24 kg/m as calculated from the following:   Height as of this encounter: 5' 9.5" (1.765 m).   Weight as of this encounter: 66.2 kg.  Malnutrition Type:   Malnutrition Characteristics:   Nutrition Interventions:    Radiology Studies: DG Forearm Left  Result Date: 09/14/2021 CLINICAL DATA:  Fall on the floor. Abrasion on the posterior aspect of the left elbow EXAM: LEFT FOREARM - 2 VIEW COMPARISON:  None. FINDINGS: There is no evidence of fracture or other focal bone lesions. Soft tissues are unremarkable. IMPRESSION: No acute fracture or dislocation. Electronically Signed   By: Keane Police D.O.   On: 09/14/2021 17:27   CT HEAD WO CONTRAST (5MM)  Result Date: 09/14/2021 CLINICAL DATA:  Recent fall, initial encounter EXAM: CT HEAD WITHOUT CONTRAST TECHNIQUE: Contiguous axial images were obtained from the base of the skull through the vertex without intravenous contrast. COMPARISON:  09/09/2021 FINDINGS: Brain: No evidence of acute infarction, hemorrhage, hydrocephalus, extra-axial collection or mass lesion/mass effect. Chronic atrophic and ischemic changes are noted and stable from the prior exam. Vascular: No hyperdense vessel or  unexpected calcification. Skull: Normal. Negative for fracture or focal lesion. Sinuses/Orbits: No acute finding. Other: None. IMPRESSION: Chronic atrophic and ischemic changes without acute abnormality. Electronically Signed   By: Inez Catalina M.D.   On: 09/14/2021 18:13    Scheduled Meds:  Chlorhexidine Gluconate Cloth  6 each Topical Daily   megestrol  400 mg Oral BID   nicotine  21 mg Transdermal Daily   pantoprazole  40 mg Oral Daily   rosuvastatin  10 mg Oral QPM   sulfamethoxazole-trimethoprim  1 tablet Oral BID   Continuous Infusions:  sodium bicarbonate 150 mEq in D5W infusion 75 mL/hr at 09/16/21 0434    LOS: 1 day   Kerney Elbe, DO [facility] PAGER is on [place]  If 7PM-7AM, please contact night-coverage [email or web address]

## 2021-09-16 NOTE — TOC Progression Note (Addendum)
Transition of Care [facility]) - Progression Note    Patient Details  Name: EMERSON SCHREIFELS MRN: [id number] Date of Birth: 06-17-1941  [facility] CM/SW Contact  Ross Ludwig, [place] Phone Number: 09/16/2021, 1:09 PM  Clinical Narrative:    Patient's wife is requesting [facility] for snf placement.  Patient has been faxed out to [facility] and other facilities in [facility].  CSW awaiting for bed offers, CSW left message for [facility] to review.  4:00pm  CSW received phone call from [facility].  They will offer a bed for patient however he will have to be aware that he can not smoke while he is at [facility].  CSW informed [facility] that CSW will follow up with patient's wife tomorrow and let her know.   Expected Discharge Plan: Home/Self Care Barriers to Discharge: Continued Medical Work up  Expected Discharge Plan and Services Expected Discharge Plan: Home/Self Care   Discharge Planning Services: CM Consult   Living arrangements for the past 2 months: Single Family Home                                       Social Determinants of Health (SDOH) Interventions    Readmission Risk Interventions No flowsheet data found.

## 2021-09-16 NOTE — Evaluation (Signed)
Occupational Therapy Evaluation Patient Details Name: Gregory Hodges MRN: [id number] DOB: [birth year] Today's Date: 09/16/2021   History of Present Illness Gregory Hodges is a 80 y.o. 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 PTA pt lived at home with his wife and was Ind with ADLs/selfcare and was driving. Pt currently required min A with LB ADLS due to Poor balance ad high risk for falls and ADLs sit - stand and in standing. Pt min A with mobility and transfers using RW.  Discussed concerns regarding home safety as pt requires physical assist and continues to be limited by weakness. Pt only has assist from wife who is limited in physical assist she can provide. Given recurrent falls recommend pt discharge to SNF for ST rehab before returning home. Pt would benefit from acute OT services to address impairments to maximize level of function and safety     Recommendations for follow up therapy are one component of a multi-disciplinary discharge planning process, led by the attending physician.  Recommendations may be updated based on patient status, additional functional criteria and insurance authorization.   Follow Up Recommendations  Skilled nursing-short term rehab (<3 hours/day)    Assistance Recommended at Discharge Frequent or constant Supervision/Assistance  Functional Status Assessment  Patient has had a recent decline in their functional status and demonstrates the ability to make significant improvements in function in a reasonable and predictable amount of time.  Equipment Recommendations   None recommended by OT    Recommendations for Other Services       Precautions / Restrictions Precautions Precautions: Fall Restrictions Weight Bearing Restrictions: No      Mobility Bed Mobility Overal bed mobility: Needs Assistance Bed Mobility: Supine to Sit;Sit to Supine     Supine to sit: Supervision;Min guard Sit to supine: Min guard;Supervision   General bed mobility comments: pt taking increased time to sit up to EOB, min cues needed for sequencing with use of bed rail to pivot to EOB. supervision for safety with supine<>sit.    Transfers Overall transfer level: Needs assistance Equipment used: Rolling walker (2 wheels) Transfers: Sit to/from Stand Sit to Stand: Min guard;Min assist           General transfer comment: no c/o dizziness      Balance Overall balance assessment: History of Falls;Needs assistance Sitting-balance support: No upper extremity supported;Feet supported Sitting balance-Leahy Scale: Good   Postural control: Posterior lean Standing balance support: Bilateral upper extremity supported Standing balance-Leahy Scale: Fair                             ADL either performed or assessed with clinical judgement   ADL Overall ADL's : Needs assistance/impaired Eating/Feeding: Set up;Independent;Sitting   Grooming: Wash/dry hands;Wash/dry face;Min guard;Standing   Upper Body Bathing: Sitting;Set up;Supervision/ safety   Lower Body Bathing: Minimal assistance Lower Body Bathing Details (indicate cue type and reason): Poor balance Upper Body Dressing : Supervision/safety;Set up;Sitting   Lower Body Dressing: Minimal assistance   Toilet Transfer: Minimal assistance;Min guard;Rollator (4 wheels);Ambulation;Cueing for safety   Toileting- Clothing Manipulation and Hygiene: Min  guard;Sit to/from stand       Functional mobility during ADLs: Minimal assistance;Min guard;Rolling walker (2 wheels)       Vision Baseline  Vision/History: 1 Wears glasses Ability to See in Adequate Light: 0 Adequate Patient Visual Report: No change from baseline       Perception     Praxis      Pertinent Vitals/Pain Pain Assessment: No/denies pain Pain Score: 0-No pain Pain Intervention(s): Monitored during session;Repositioned     Hand Dominance Right   Extremity/Trunk Assessment Upper Extremity Assessment Upper Extremity Assessment: Generalized weakness   Lower Extremity Assessment Lower Extremity Assessment: Defer to PT evaluation   Cervical / Trunk Assessment Cervical / Trunk Assessment: Normal   Communication Communication Communication: No difficulties   Cognition Arousal/Alertness: Awake/alert Behavior During Therapy: WFL for tasks assessed/performed Overall Cognitive Status: Within Functional Limits for tasks assessed                                       General Comments       Exercises     Shoulder Instructions      Home Living Family/patient expects to be discharged to:: Private residence Living Arrangements: Spouse/significant other Available Help at Discharge: Family;Available 24 hours/day Type of Home: House Home Access: Stairs to enter [employer] of Steps: 2-3 from the garage Entrance Stairs-Rails: Right;Left;Can reach both Home Layout: Two level Alternate Level Stairs-Number of Steps: 14 Alternate Level Stairs-Rails: Right Bathroom Shower/Tub: Walk-in shower;Tub/shower unit   Bathroom Toilet: Handicapped height     Home Equipment: Conservation officer, nature (2 wheels);Cane - single point;BSC/3in1;Shower seat;Grab bars - tub/shower          Prior Functioning/Environment Prior Level of Function : Independent/Modified Independent             Mobility Comments: has been using a cane predominantly but admits to recent falls ADLs Comments: was Ind with ADLs/selfcare        OT Problem List: Decreased strength;Impaired balance (sitting and/or  standing);Decreased safety awareness;Decreased activity tolerance;Decreased knowledge of use of DME or AE      OT Treatment/Interventions: Self-care/ADL training;Patient/family education;Balance training;Therapeutic activities;DME and/or AE instruction    OT Goals(Current goals can be found in the care plan section) Acute Rehab OT Goals Patient Stated Goal: go home OT Goal Formulation: With patient Time For Goal Achievement: 09/30/21 Potential to Achieve Goals: Good ADL Goals Pt Will Perform Grooming: with set-up;with supervision;standing Pt Will Perform Upper Body Dressing: with set-up;with modified independence;sitting Pt Will Perform Lower Body Dressing: with min guard assist;sit to/from stand Pt Will Transfer to Toilet: with min guard assist;with supervision;ambulating Pt Will Perform Toileting - Clothing Manipulation and hygiene: with supervision  OT Frequency: Min 2X/week   Barriers to D/C:            Co-evaluation              AM-PAC OT "6 Clicks" Daily Activity     Outcome Measure Help from another person eating meals?: None Help from another person taking care of personal grooming?: A Little Help from another person toileting, which includes using toliet, bedpan, or urinal?: A Little Help from another person bathing (including washing, rinsing, drying)?: A Lot Help from another person to put on and taking off regular upper body clothing?: A Little Help from another person to put on and taking off regular lower body clothing?: A Lot 6 Click Score: 17  End of Session Equipment Utilized During Treatment: Rolling walker (2 wheels);Gait belt  Activity Tolerance: Patient limited by fatigue Patient left: in bed;with call bell/phone within reach;with bed alarm set  OT Visit Diagnosis: Unsteadiness on feet (R26.81);Other abnormalities of gait and mobility (R26.89);Muscle weakness (generalized) (M62.81)                Time: 6256-3893 OT Time Calculation (min): 27  min Charges:  OT General Charges $OT Visit: 1 Visit OT Evaluation $OT Eval Moderate Complexity: 1 Mod OT Treatments $Therapeutic Activity: 8-22 mins   Britt Bottom 09/16/2021, 12:42 PM

## 2021-09-17 ENCOUNTER — Telehealth: Payer: Self-pay

## 2021-09-17 DIAGNOSIS — C3491 Malignant neoplasm of unspecified part of right bronchus or lung: Secondary | ICD-10-CM | POA: Diagnosis not present

## 2021-09-17 DIAGNOSIS — Z515 Encounter for palliative care: Secondary | ICD-10-CM

## 2021-09-17 DIAGNOSIS — W19XXXA Unspecified fall, initial encounter: Secondary | ICD-10-CM | POA: Diagnosis not present

## 2021-09-17 DIAGNOSIS — E860 Dehydration: Secondary | ICD-10-CM | POA: Diagnosis not present

## 2021-09-17 DIAGNOSIS — Z66 Do not resuscitate: Secondary | ICD-10-CM

## 2021-09-17 DIAGNOSIS — Z7189 Other specified counseling: Secondary | ICD-10-CM

## 2021-09-17 DIAGNOSIS — E43 Unspecified severe protein-calorie malnutrition: Secondary | ICD-10-CM

## 2021-09-17 DIAGNOSIS — R531 Weakness: Secondary | ICD-10-CM | POA: Diagnosis not present

## 2021-09-17 LAB — CBC WITH DIFFERENTIAL/PLATELET
Abs Immature Granulocytes: 0.01 10*3/uL (ref 0.00–0.07)
Basophils Absolute: 0 10*3/uL (ref 0.0–0.1)
Basophils Relative: 0 %
Eosinophils Absolute: 0 10*3/uL (ref 0.0–0.5)
Eosinophils Relative: 1 %
HCT: 28.1 % — ABNORMAL LOW (ref 39.0–52.0)
Hemoglobin: 9.3 g/dL — ABNORMAL LOW (ref 13.0–17.0)
Immature Granulocytes: 0 %
Lymphocytes Relative: 5 %
Lymphs Abs: 0.2 10*3/uL — ABNORMAL LOW (ref 0.7–4.0)
MCH: 31.4 pg (ref 26.0–34.0)
MCHC: 33.1 g/dL (ref 30.0–36.0)
MCV: 94.9 fL (ref 80.0–100.0)
Monocytes Absolute: 0.4 10*3/uL (ref 0.1–1.0)
Monocytes Relative: 12 %
Neutro Abs: 2.6 10*3/uL (ref 1.7–7.7)
Neutrophils Relative %: 82 %
Platelets: 85 10*3/uL — ABNORMAL LOW (ref 150–400)
RBC: 2.96 MIL/uL — ABNORMAL LOW (ref 4.22–5.81)
RDW: 15.1 % (ref 11.5–15.5)
WBC: 3.2 10*3/uL — ABNORMAL LOW (ref 4.0–10.5)
nRBC: 0 % (ref 0.0–0.2)

## 2021-09-17 LAB — COMPREHENSIVE METABOLIC PANEL
ALT: 8 U/L (ref 0–44)
AST: 11 U/L — ABNORMAL LOW (ref 15–41)
Albumin: 2.7 g/dL — ABNORMAL LOW (ref 3.5–5.0)
Alkaline Phosphatase: 39 U/L (ref 38–126)
Anion gap: 6 (ref 5–15)
BUN: 19 mg/dL (ref 8–23)
CO2: 26 mmol/L (ref 22–32)
Calcium: 7.9 mg/dL — ABNORMAL LOW (ref 8.9–10.3)
Chloride: 105 mmol/L (ref 98–111)
Creatinine, Ser: 1.13 mg/dL (ref 0.61–1.24)
GFR, Estimated: >60 mL/min (ref >60)
Glucose, Bld: 106 mg/dL — ABNORMAL HIGH (ref 70–99)
Potassium: 3.8 mmol/L (ref 3.5–5.1)
Sodium: 137 mmol/L (ref 135–145)
Total Bilirubin: 0.7 mg/dL (ref 0.3–1.2)
Total Protein: 4.7 g/dL — ABNORMAL LOW (ref 6.5–8.1)

## 2021-09-17 LAB — MAGNESIUM: Magnesium: 1.9 mg/dL (ref 1.7–2.4)

## 2021-09-17 LAB — PHOSPHORUS: Phosphorus: 2.8 mg/dL (ref 2.5–4.6)

## 2021-09-17 MED ORDER — ALBUMIN HUMAN 25 % IV SOLN
50.0000 g | Freq: Once | INTRAVENOUS | Status: AC
  Administered 2021-09-17: 21:00:00 50 g via INTRAVENOUS
  Filled 2021-09-17: qty 200

## 2021-09-17 MED ORDER — BOOST / RESOURCE BREEZE PO LIQD CUSTOM
1.0000 | ORAL | Status: DC
  Administered 2021-09-18 – 2021-09-21 (×4): 1 via ORAL

## 2021-09-17 MED ORDER — ENSURE ENLIVE PO LIQD
237.0000 mL | ORAL | Status: DC
  Administered 2021-09-17 – 2021-09-21 (×5): 237 mL via ORAL

## 2021-09-17 MED ORDER — SODIUM CHLORIDE 0.9 % IV SOLN: INTRAVENOUS | Status: DC

## 2021-09-17 NOTE — Progress Notes (Signed)
Physical Therapy Treatment Patient Details Name: Gregory Hodges MRN: [id number] DOB: Aug 06, 1941 Today's Date: 09/17/2021   History of Present Illness Gregory Hodges is a 80 y.o. 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  Applied TEDS. Assisted OOB to amb required increased time and + 2 assist for safety.  General bed mobility comments: pt self able to sit EOB with HOB elevated and use of rail. General transfer comment: 25% VC's on proper hand placement and safety with turns.  General Gait Details: required + 2 assist for safety due to Hypotension.  Recliner following behind as well. Orthostatic BP's   Supine      BP 94/61  HR 84 EOB          BP 97/65  HR 102 Standing   BP 77/52  HR 107 Amb 20 feet  BP 74/55  HR 113 Amb 50 feet   BP 77/53  HR 102   Pt ASYMPTOMATIC throughout.  Tolerated distance well. Pt will need ST Rehab at SNF to address his mobility deficits and strength.   Recommendations for follow up therapy are one component of a multi-disciplinary discharge planning process, led by the attending physician.  Recommendations may be updated based on patient status, additional functional criteria and insurance authorization.  Follow Up Recommendations  Skilled nursing-short term rehab (<3 hours/day)     Assistance Recommended at Discharge Frequent or constant Supervision/Assistance  Equipment Recommendations  None recommended by PT    Recommendations for Other Services       Precautions / Restrictions Precautions Precautions: Fall Precaution Comments: orthostatic     Mobility  Bed Mobility Overal bed mobility: Needs Assistance Bed Mobility: Supine to Sit     Supine  to sit: Min guard     General bed mobility comments: pt self able to sit EOB with HOB elevated and use of rail.    Transfers Overall transfer level: Needs assistance Equipment used: Rolling walker (2 wheels)   Sit to Stand: Min guard;Min assist           General transfer comment: 25% VC's on proper hand placement and safety with turns    Ambulation/Gait Ambulation/Gait assistance: Min guard;Min assist Gait Distance (Feet): 75 Feet Assistive device: Rolling walker (2 wheels);Rollator (4 wheels) Gait Pattern/deviations: Step-through pattern;Decreased step length - right;Decreased step length - left;Shuffle;Trunk flexed Gait velocity: decr     General Gait Details: required + 2 assist for safety due to Hypotension.  Recliner following behind as well.   Stairs             Wheelchair Mobility    Modified Rankin (Stroke Patients Only)       Balance                                            Cognition Arousal/Alertness: Awake/alert   Overall Cognitive Status: Within Functional Limits for tasks assessed                                 General Comments:  AxO x 3 very pleasant        Exercises      General Comments        Pertinent Vitals/Pain Pain Assessment: No/denies pain    Home Living                          Prior Function            PT Goals (current goals can now be found in the care plan section) Progress towards PT goals: Progressing toward goals    Frequency    Min 3X/week      PT Plan Current plan remains appropriate    Co-evaluation              AM-PAC PT "6 Clicks" Mobility   Outcome Measure  Help needed turning from your back to your side while in a flat bed without using bedrails?: A Little Help needed moving from lying on your back to sitting on the side of a flat bed without using bedrails?: A Little Help needed moving to and from a bed to a chair (including a  wheelchair)?: A Little Help needed standing up from a chair using your arms (e.g., wheelchair or bedside chair)?: A Little Help needed to walk in hospital room?: A Lot Help needed climbing 3-5 steps with a railing? : Total 6 Click Score: 15    End of Session Equipment Utilized During Treatment: Gait belt Activity Tolerance: Treatment limited secondary to medical complications (Comment) Patient left: in chair;with call bell/phone within reach;with chair alarm set Nurse Communication: Mobility status PT Visit Diagnosis: Unsteadiness on feet (R26.81);Muscle weakness (generalized) (M62.81)     Time: 4888-9169 PT Time Calculation (min) (ACUTE ONLY): 23 min  Charges:  $Gait Training: 8-22 mins $Therapeutic Activity: 8-22 mins                     Rica Koyanagi  PTA Acute  Rehabilitation Services Pager      [phone] Office      [phone]

## 2021-09-17 NOTE — Progress Notes (Signed)
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  Patient had reported 1 to 2 weeks of generalized weakness and absence of any acute focal weakness.  He had generalized weakness with difficulty with independent completion of his ADLs with progressive difficulty ambulation resulting in multiple falls.  Yesterday's fall he struck the floor with his left hip and had sharp left nonradiating hip discomfort.  He was able to wear weight on the left lower extremity but had no exacerbation of his left hip.  The falls were not associate with any loss of consciousness and patient admits that he did not hit his head.  He is on daily aspirin and blood thinners at home and PT was placed yesterday by his outpatient provider along with a four-wheel walker although it has not been delivered yet.  Given his recurrent weakness and recurrent falls and brought into the hospital for observation.  Patient improved and PT OT recommending home health however patient's family had significant concerns for safe returning home given that patient's recurrent falls and because he remains somewhat altered.  Psychiatry was consulted for capacity evaluation.  Patient was given IV fluid hydration with improvement in his renal function.  He had underwent work-up in the ED and an EKG as well as an MRI of the brain which showed that the enhancing lesions in the right cerebral hemisphere, right frontal lobe and right medial temporal lobe were no longer identified and there is no evidence of any intracranial metastatic disease.   The EDP discussed with on-call oncologist who recommended admission for generalized weakness.  He was given a liter normal saline bolus and then placed on continuous normal saline at 125 mL/hr and then reduce to 75 MLS per hour.   Upon reevaluation today patient was orthostatic and was given another normal saline bolus and TED hose.  Psychiatry evaluated and deemed the patient have capacity to make medical decisions.  PT OT reevaluating and now recommending SNF.  Patient is now agreeable to SNF.  His labs are improving and he continues to be significantly weak and orthostatic so we will repeat his orthostatic vital signs again in the morning continue maintenance IV fluid hydration for now.  Nutritionist evaluation still pending   Patient's renal function is relatively stable and his metabolic acidosis is improved   Subjective: Afebrile overnight A/O x4, patient frustrated that he is not been discharged to SNF.  Patient's GOAL is to get to rehab and then get home to spend as much time at home and with family as he has left.   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 Alfredia Ferguson oncology was consulted and recommended observation in hospital, however there is no  oncology note in the chart for this admission. - 12/21 we will contact oncology in a.m. patient still pancytopenic.  Given that patient is being discharged to SNF and is immunocompromised are they comfortable.  Neupogen prior to discharge?  Patient feels comfortable with this plan  Generalized weakness with recurrent falls and physical  deconditioning -Multifactorial metastatic cancer to brain, deconditioning, chemotherapy, radiation therapy to brain. -He has had 1 to 2 weeks of progressive generalized weakness and absence of any focal neurologic deficit -MRI brain performed as above with no acute ischemic infarct and showed improvement in his metastatic processes -PT/OT recommends SNF- -Orthostatic vital signs were checked; patient orthostatic. - 12/21 orthostatic vitals every shift - 12/21 given mets to adrenal glands a.m. cortisol pending -TSH was normal -12/19 psychiatry states patient has capacity to make medical decisions   Recurrent falls with unwitnessed fall in the ED -Patient has had progressive generalized weakness and recurrent falls -MRI brain as above -Had a unwitnessed fall in the ED so we will obtain a stat head CT scan -He has metastatic disease with a consequence of going under chemotherapy.  Likely his falls have been in the setting of dehydration given his poor p.o. intake and so we will give him IV fluid hydration and continue and given another liter bolus -Continue with fall precautions and urinalysis was unremarkable -TSH was unremarkable -Follow-up on repeat head CT scan for fall and showed no acute intracranial abnormality -PT OT now recommending SNF and he is agreeable  Orthostatic hypotension - Essential HTN.  DC all BP medication -Check orthostatic vital signs and they were positive - See dehydration - 12/21 albumin 50 g x 1 -12/21 we will await findings of a.m. cortisol: If low start Solu-Cortef   Agitation mild confusion, improved -12/21 resolved  [place] metabolic acidosis -95/28 resolved - Strict in and out - Daily weight  Protein calorie malnutrition with weight loss that is unintentional -Has been on Megace and diagnosed with small cell cancer -Estimated body mass index is 21.24 kg/m as calculated from the following:   Height as of this encounter: 5' 9.5" (1.765 m).   Weight  as of this encounter: 66.2 kg. -Nutrition was consulted and prealbumin was normal    Dehydration -Has had poor p.o. intake -12/21 DC sodium bicarb -12/21 normal saline 50m/hr  AKI -Improving in the setting of IV fluid hydration -Patient's BUNs/creatinine went from 36/1.41 -> 29/1.14 -> -> 26/1.05 -> and is now 19/1.24 -Continue with IV fluid hydration as above -Nephrotoxic medications, contrast dyes, hypotension and renally dose medications - Lab Results  Component Value Date   CREATININE 1.13 09/17/2021   CREATININE 1.24 09/16/2021   CREATININE 1.05 09/15/2021   CREATININE 1.14 09/14/2021   CREATININE 1.41 (H) 09/13/2021  ]   Hyperlipidemia -Continue with home statin   GERD -Continue PPI   Normocytic anemia, chronic(baseline HgB10-13) -Check anemia panel in the AM  -Continue to monitor for signs and symptoms bleeding; currently no overt bleeding noted Lab Results  Component Value Date   HGB 9.3 (L) 09/17/2021   HGB 9.2 (L) 09/16/2021   HGB 9.7 (L) 09/15/2021   HGB 11.6 (L) 09/14/2021   HGB 11.0 (L) 09/13/2021     Thrombocytopenia -Patient's platelet count went from 111 -> 95 -> 87 -Continue to monitor for signs and symptoms of bleeding; currently no overt bleeding noted -Repeat CBC in a.m.   Pancytopenia -All the patient's counts are down today and WBC is 3.0, hemoglobin/hematocrit is now 9.2/27.7, and platelet  count is 87 -Continue to monitor and trend and repeat CBC in a.m.   Acute urinary retention -Had greater than 540 mL yesterday so Foley catheter was placed -Getting IV fluid resuscitation and maintenance  -Foley inserted and try a trial of void in the morning -12/21 strict in and out - 12/21 Daily weight   Tobacco abuse -Smoking cessation counseling given and has been smoking greater then 30 years -Continue nicotine patch and nicotine gum  Goals of care - 12/21 Palliative Care Consult:healthteam advantage his insurance is requesting he have a  palliative care consult for snf placement. 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    DVT prophylaxis: SCD Code Status: DNR Family Communication:  Status is: Inpatient    Dispo: The patient is from: Home              Anticipated d/c is to: SNF              Anticipated d/c date is: 2 days              Patient currently is not medically stable to d/c.      Consultants:  Psychiatry  Procedures/Significant Events:     I have personally reviewed and interpreted all radiology studies and my findings are as above.  VENTILATOR SETTINGS:    Cultures   Antimicrobials:    Devices    LINES / TUBES:      Continuous Infusions:  sodium bicarbonate 150 mEq in D5W infusion 75 mL/hr at 09/16/21 1934     Objective: Vitals:   09/16/21 1238 09/16/21 1957 09/17/21 0500 09/17/21 0541  BP: 109/60 (!) 107/58  107/69  Pulse: 63 66  63  Resp: 16 20  18   Temp: 98.6 F (37 C) 99 F (37.2 C)  (!) 97.4 F (36.3 C)  TempSrc: Oral Oral  Oral  SpO2: 96% 95%  94%  Weight:   66.9 kg   Height:        Intake/Output Summary (Last 24 hours) at 09/17/2021 1856 Last data filed at 09/17/2021 0544 Gross per 24 hour  Intake 480 ml  Output 2975 ml  Net -2495 ml   Filed Weights   09/15/21 0500 09/16/21 0539 09/17/21 0500  Weight: 64.9 kg 66.2 kg 66.9 kg    Examination:  General: A/O x4 No acute respiratory distress Eyes: negative scleral hemorrhage, negative anisocoria, negative icterus ENT: Negative Runny nose, negative gingival bleeding, Neck:  Negative scars, masses, torticollis, lymphadenopathy, JVD Lungs: Clear to auscultation bilaterally without wheezes or crackles Cardiovascular: Regular rate and rhythm without murmur gallop or rub normal S1 and S2 Abdomen: negative abdominal pain, nondistended, positive soft, bowel sounds, no rebound,  no ascites, no appreciable mass Extremities: No significant cyanosis, clubbing, or edema bilateral lower extremities Skin: Negative rashes, lesions, ulcers Psychiatric:  Negative depression, negative anxiety, negative fatigue, negative mania  Central nervous system:  Cranial nerves II through XII intact, tongue/uvula midline, all extremities muscle strength 5/5, sensation intact throughout, negative dysarthria, negative expressive aphasia, negative receptive aphasia.  .     Data Reviewed: Care during the described time interval was provided by me .  I have reviewed this patient's available data, including medical history, events of note, physical examination, and all test results as part of my evaluation.   CBC: Recent Labs  Lab 09/13/21 1447 09/14/21 0520 09/15/21 0333 09/16/21 1142 09/17/21 3149  WBC 4.5 5.1 3.4* 3.0* 3.2*  NEUTROABS 3.7 4.3 2.8 2.5 2.6  HGB 11.0* 11.6* 9.7* 9.2* 9.3*  HCT 34.2* 35.5* 30.2* 27.7* 28.1*  MCV 96.1 96.7 97.1 94.9 94.9  PLT 100* 111* 95* 87* 85*   Basic Metabolic Panel: Recent Labs  Lab 09/13/21 1447 09/14/21 0520 09/15/21 0333 09/16/21 1142 09/17/21 0339  NA 140 139 136 136 137  K 4.6 4.6 4.3 3.6 3.8  CL 115* 114* 115* 108 105  CO2 18* 17* 17* 23 26  GLUCOSE 90 89 80 87 106*  BUN 36* 29* 26* 19 19  CREATININE 1.41* 1.14 1.05 1.24 1.13  CALCIUM 8.8* 8.7* 8.3* 7.7* 7.9*  MG  --  2.0 1.8 1.9 1.9  PHOS  --   --  2.8 2.7 2.8   GFR: Estimated Creatinine Clearance: 51 mL/min (by C-G formula based on SCr of 1.13 mg/dL). Liver Function Tests: Recent Labs  Lab 09/13/21 1447 09/14/21 0520 09/15/21 0333 09/16/21 1142 09/17/21 0339  AST 11* 14* 14* 13* 11*  ALT 9 8 9 8 8   ALKPHOS 46 45 39 37* 39  BILITOT 0.5 1.0 0.9 0.7 0.7  PROT 5.7* 6.1* 4.9* 4.6* 4.7*  ALBUMIN 3.5 3.6 2.9* 2.7* 2.7*   No results for input(s): LIPASE, AMYLASE in the last 168 hours. No results for input(s): AMMONIA in the last 168 hours. Coagulation  Profile: Recent Labs  Lab 09/14/21 0520  INR 1.2   Cardiac Enzymes: Recent Labs  Lab 09/14/21 0520  CKTOTAL 135   BNP (last 3 results) No results for input(s): PROBNP in the last 8760 hours. HbA1C: No results for input(s): HGBA1C in the last 72 hours. CBG: No results for input(s): GLUCAP in the last 168 hours. Lipid Profile: No results for input(s): CHOL, HDL, LDLCALC, TRIG, CHOLHDL, LDLDIRECT in the last 72 hours. Thyroid Function Tests: No results for input(s): TSH, T4TOTAL, FREET4, T3FREE, THYROIDAB in the last 72 hours. Anemia Panel: No results for input(s): VITAMINB12, FOLATE, FERRITIN, TIBC, IRON, RETICCTPCT in the last 72 hours. Urine analysis:    Component Value Date/Time   COLORURINE YELLOW 09/13/2021 2145   APPEARANCEUR CLOUDY (A) 09/13/2021 2145   LABSPEC 1.023 09/13/2021 2145   PHURINE 5.0 09/13/2021 2145   GLUCOSEU NEGATIVE 09/13/2021 2145   HGBUR NEGATIVE 09/13/2021 2145   BILIRUBINUR NEGATIVE 09/13/2021 2145   KETONESUR NEGATIVE 09/13/2021 2145   PROTEINUR NEGATIVE 09/13/2021 2145   UROBILINOGEN 0.2 07/22/2010 1113   NITRITE NEGATIVE 09/13/2021 2145   LEUKOCYTESUR NEGATIVE 09/13/2021 2145   Sepsis Labs: @LABRCNTIP (procalcitonin:4,lacticidven:4)  ) Recent Results (from the past 240 hour(s))  Culture, Urine     Status: None   Collection Time: 09/09/21 11:27 AM   Specimen: Urine, Clean Catch  Result Value Ref Range Status   Specimen Description   Final    URINE, CLEAN CATCH Performed at [facility], [place] [address], [place], [place] [address]    Special Requests   Final    NONE Performed at [facility], [place] [address], [place], [place] [address]    Culture   Final    NO GROWTH Performed at [facility], [place] [address], [place], [place] [address]    Report Status 09/10/2021 FINAL  Final  Resp Panel by RT-PCR (Flu A&B, Covid) Nasopharyngeal Swab     Status: None   Collection Time:  09/13/21  8:46 PM   Specimen: Nasopharyngeal Swab; Nasopharyngeal(NP) swabs in vial transport medium  Result Value Ref Range  Status   SARS Coronavirus 2 by RT PCR NEGATIVE NEGATIVE Final    Comment: (NOTE) SARS-CoV-2 target nucleic acids are NOT DETECTED.  The SARS-CoV-2 RNA is generally detectable in upper respiratory specimens during the acute phase of infection. The lowest concentration of SARS-CoV-2 viral copies this assay can detect is 138 copies/mL. A negative result does not preclude SARS-Cov-2 infection and should not be used as the sole basis for treatment or other patient management decisions. A negative result may occur with  improper specimen collection/handling, submission of specimen other than nasopharyngeal swab, presence of viral mutation(s) within the areas targeted by this assay, and inadequate number of viral copies(<138 copies/mL). A negative result must be combined with clinical observations, patient history, and epidemiological information. The expected result is Negative.  Fact Sheet for Patients:  EntrepreneurPulse.com.au  Fact Sheet for Healthcare Providers:  IncredibleEmployment.be  This test is no t yet approved or cleared by the [place] FDA and  has been authorized for detection and/or diagnosis of SARS-CoV-2 by FDA under an Emergency Use Authorization (EUA). This EUA will remain  in effect (meaning this test can be used) for the duration of the COVID-19 declaration under Section 564(b)(1) of the Act, 21 U.S.C.section 360bbb-3(b)(1), unless the authorization is terminated  or revoked sooner.       Influenza A by PCR NEGATIVE NEGATIVE Final   Influenza B by PCR NEGATIVE NEGATIVE Final    Comment: (NOTE) The Xpert Xpress SARS-CoV-2/FLU/RSV plus assay is intended as an aid in the diagnosis of influenza from Nasopharyngeal swab specimens and should not be used as a sole basis for treatment. Nasal washings  and aspirates are unacceptable for Xpert Xpress SARS-CoV-2/FLU/RSV testing.  Fact Sheet for Patients: EntrepreneurPulse.com.au  Fact Sheet for Healthcare Providers: IncredibleEmployment.be  This test is not yet approved or cleared by the [place] FDA and has been authorized for detection and/or diagnosis of SARS-CoV-2 by FDA under an Emergency Use Authorization (EUA). This EUA will remain in effect (meaning this test can be used) for the duration of the COVID-19 declaration under Section 564(b)(1) of the Act, 21 U.S.C. section 360bbb-3(b)(1), unless the authorization is terminated or revoked.  Performed at [facility], [place] [address], [place], [place] [address]          Radiology Studies: No results found.      Scheduled Meds:  Chlorhexidine Gluconate Cloth  6 each Topical Daily   megestrol  400 mg Oral BID   nicotine  21 mg Transdermal Daily   pantoprazole  40 mg Oral Daily   rosuvastatin  10 mg Oral QPM   sulfamethoxazole-trimethoprim  1 tablet Oral BID   Continuous Infusions:  sodium bicarbonate 150 mEq in D5W infusion 75 mL/hr at 09/16/21 1934     LOS: 2 days   The patient is critically ill with multiple organ systems failure and requires high complexity decision making for assessment and support, frequent evaluation and titration of therapies, application of advanced monitoring technologies and extensive interpretation of multiple databases. Critical Care Time devoted to patient care services described in this note  Time spent: 40 minutes     Omayra Tulloch, Geraldo Docker, MD [facility]   If 7PM-7AM, please contact night-coverage 09/17/2021, 9:09 AM

## 2021-09-17 NOTE — Progress Notes (Signed)
Initial Nutrition Assessment  DOCUMENTATION CODES:   Not applicable  INTERVENTION:  - will order Boost Breeze once/day, each supplement provides 250 kcal and 9 grams of protein. - will order Ensure Enlive once/day each supplement provides 350 kcal and 20 grams of protein. - complete NFPE when feasible.    NUTRITION DIAGNOSIS:   Increased nutrient needs related to acute illness, cancer and cancer related treatments, chronic illness as evidenced by estimated needs.  GOAL:   Patient will meet greater than or equal to 90% of their needs  MONITOR:   PO intake, Supplement acceptance, Labs, Weight trends  REASON FOR ASSESSMENT:   Malnutrition Screening Tool, Consult Assessment of nutrition requirement/status  ASSESSMENT:   80 year old male with a medical history of small cell carcinoma of the R lung with metastasis to the brain and adrenal gland undergoing chemo, HTN, HLD, PVD, AAA, Crohns disease, MI, and chronic tobacco. Patient presented to the ED due to generalized weakness and increased difficulty ambulating and completing ADLs. He has experienced multiple falls. On the day prior to hospitalization he fell and hip his L hip and had subsequent sharp L hip pain.  Unable to see patient x2 attempts. Documentation in flow sheet indicates he has been eating mainly 80-100% at meals over the past 3 days with intakes today being 85% of breakfast and 90% of lunch. Megace started on date of admission, 12/17, for this hospitalization although patient did start this medication PTA.   He is being followed by a RD at the [facility] and has been followed for the past 6 months with most recent assessment on 09/09/21.   Weight today is 147 lb and weight on 12/17 was documented as 144 lb. Weight on 08/12/21 was 158 lb which indicates 11 lb weight loss (7% body weight) in 1 month; significant for time frame.    Labs reviewed; Ca: 7.9 mg/dl.  Medications reviewed; 400 mg megace BID, 40 mg oral  protonix/day,   IVF; D5-150 mEq sodium bicarb @ 75 ml/hr (306 kcal/24 hrs).      NUTRITION - FOCUSED PHYSICAL EXAM:  Unable to complete at this time.  Diet Order:   Diet Order             Diet regular Room service appropriate? Yes; Fluid consistency: Thin  Diet effective now                   EDUCATION NEEDS:   No education needs have been identified at this time  Skin:  Skin Assessment: Reviewed RN Assessment  Last BM:  PTA/unknown  Height:   Ht Readings from Last 1 Encounters:  09/13/21 5' 9.5" (1.765 m)    Weight:   Wt Readings from Last 1 Encounters:  09/17/21 66.9 kg     Estimated Nutritional Needs:  Kcal:  2050-2250 kcal Protein:  100-120 grams Fluid:  >/= 2 L/day      Jarome Matin, MS, RD, LDN, CNSC Inpatient Clinical Dietitian RD pager # available in [place]  After hours/weekend pager # available in [facility]

## 2021-09-17 NOTE — Telephone Encounter (Signed)
Pt called [facility] from the hospital requesting Dr. Julien Nordmann "make the hospital discharge him". Pt has been advised to discuss his discharge concerns with his hospital providers. Pt states he will talk to Dr. Sherral Hammers for an explanation of why he is still "trapped in the hospital".

## 2021-09-17 NOTE — Consult Note (Signed)
Palliative Care Consult Note                                  Date: 09/17/2021   Patient Name: Gregory Hodges  DOB: 08/06/1941  MRN: [id number]  Age / Sex: 80 y.o., male  PCP: [name], MD Referring Physician: Allie Bossier, MD  Reason for Consultation: Establishing goals of care  HPI/Patient Profile: Palliative Care consult requested for goals of care discussion in this 80 y.o. 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 He was admitted on 09/13/2021 from home with generalized weakness.   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 and placement of bilateral axillofemoral grafts 07/22/10 [facility])   Cancer ([place])    Skin   Crohn's disease ([place])    E coli bacteremia    History of kidney stones    Hyperlipemia    Hypertension    "denies"   Myocardial infarction ([place]) 08/11/2005   s/p Horizon study stent D1   Peripheral vascular disease ([place]) June 2011   SCLC (small cell lung carcinoma) ([place]) dx'd 10/2020   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   I recently had a virtual visit with patient on 09/12/2021 as outpatient at the [facility]. I reviewed reviewed concepts of Palliative Care. Values and goals of care important  to patient and family were attempted to be elicited.  Gregory Hodges reports since our last discussion he continued to struggle with ongoing weakness and falls. This has been new for him over the past month as he was able to ambulate and perform ADLs independently prior to onset of weakness. He did require some occasional assistance due to fatigue around chemo treatments but otherwise felt his quality of life was sufficient.    We discussed His current illness and what it means in the larger context of His on-going co-morbidities. Natural disease trajectory and expectations were discussed.  Gregory Hodges verbalized understanding of current illness and co-morbidities. He is remaining hopeful for continued improvement/stability with plans to continue pursuing any and all recommended treatments.   He speaks to pending plans for rehab at [facility] which he is hopeful for. He is clear in expressed goals to continue to treat and hopeful regain some strength to return home and to his quality of life that he had previously. He knows he may have a new baseline but feels that there is still room for some improvement. His appetite is improving as he was originally concerned about this.   I discussed the importance of continued conversation with family and their medical providers regarding overall plan of care and treatment options, ensuring decisions are within the context of the patients values and GOCs.  Questions and concerns were addressed.  Patient was encouraged to call with questions or concerns.  PMT will continue to support holistically as  needed.  Life Review: Gregory Hodges shares he lives in the home with his wife of more than 50 years.  They have 2 children and 2 grandchildren.  He is a retired Journalist, newspaper.    Objective:   Primary Diagnoses: Present on Admission:  Metabolic acidosis, normal anion gap (NAG)  Protein calorie malnutrition (HCC)  Dehydration  Acute prerenal azotemia  Hyperlipemia   Hypertension  Small cell lung cancer, right (HCC)   Scheduled Meds:  Chlorhexidine Gluconate Cloth  6 each Topical Daily   [START ON 09/18/2021] feeding supplement  1 Container Oral Q24H   feeding supplement  237 mL Oral Q24H   megestrol  400 mg Oral BID   nicotine  21 mg Transdermal Daily   pantoprazole  40 mg Oral Daily   rosuvastatin  10 mg Oral QPM   sulfamethoxazole-trimethoprim  1 tablet Oral BID    Continuous Infusions:  sodium bicarbonate 150 mEq in D5W infusion 75 mL/hr at 09/17/21 1329    PRN Meds: acetaminophen **OR** acetaminophen, nicotine polacrilex, ondansetron (ZOFRAN) IV, sodium chloride flush  Allergies  Allergen Reactions   Oxycodone Nausea And Vomiting    Pt states he can not take any narcotic pain pills   Codeine Nausea And Vomiting    All Narcotics   Lisinopril     Throat swell up   Oxycodone Hcl Nausea And Vomiting    Review of Systems  Neurological:  Positive for weakness.       Falls  Unless otherwise noted, a complete review of systems is negative.  Physical Exam General: NAD Cardiovascular: regular rate and rhythm Pulmonary: clear ant fields Abdomen: soft, nontender, + bowel sounds Extremities: no edema, no joint deformities Skin: no rashes, warm and dry Neurological: AAO x3, mood appropriate   Vital Signs:  BP (!) 86/52 (BP Location: Left Arm)    Pulse 87    Temp 98.1 F (36.7 C) (Oral)    Resp 16    Ht 5' 9.5" (1.765 m)    Wt 66.9 kg    SpO2 96%    BMI 21.47 kg/m  Pain Scale: 0-10 POSS *See Group Information*: 1-Acceptable,Awake and alert Pain Score: 0-No pain  SpO2: SpO2: 96 % O2 Device:SpO2: 96 % O2 Flow Rate: .   IO: Intake/output summary:  Intake/Output Summary (Last 24 hours) at 09/17/2021 1618 Last data filed at 09/17/2021 1300 Gross per 24 hour  Intake 600 ml  Output 2750 ml  Net -2150 ml    LBM: Last BM Date: 09/16/21 Baseline Weight: Weight: 65.3 kg Most recent weight: Weight: 66.9 kg      Palliative  Assessment/Data: PPS 40%   Advanced Care Planning:   Primary Decision Maker: PATIENT and wife (HCPOA)-Carol Weinfeld   Code Status/Advance Care Planning: DNR  A discussion was had today regarding advanced directives. Concepts specific to code status, artifical feeding and hydration, continued IV antibiotics and rehospitalization was had.    I discussed with patient at length his current full code status with consideration to his current illness, co-morbidities, and goals of care. Gregory Hodges expresses wishes for DNR/DNI. Education provided on what DNR would look like. He verbalizes understanding again confirming wishes.   Patient's wife is a retired Forensic psychologist. They have completed advanced directives to include his wishes. He is not interested in life-sustaining measures including artificial feeding/PEG. MOST form introduced with plans to further discuss and complete on tomorrow.   The difference between a aggressive medical intervention path and a palliative comfort care path was  discussed.  Gregory Hodges is clear in expressed wishes to continue to treat aggressively with DNR limitations. Hopeful for improvement with rehab.   I am currently involved with patient's care and follow him at the [facility] for outpatient palliative support. I will plan to continue to follow him s/p discharge. If he goes to SNF we will plan virtual follow-up until he is able to come in person. Patient is aware and in agreement.   Decisions/Changes to ACP: DNR/DNI No artificial feedings/PEG  Assessment & Plan:   SUMMARY OF RECOMMENDATIONS   DNR/DNI-as requested and confirmed by patient (form completed) MOST introduced with plans for additional discussions on tomorrow.  Continue with current plan of care. Gregory Hodges is clear in expressed wishes to continue to treat aggressively. His appetite is somewhat improving. His quality of life a month prior was sufficient however now decreased due to onset of generalized  weakness and falls. He is remaining hopeful for some improvement which can come with SNF rehabilitation (pending approval). He worries he will continue to decline without sufficient PT and is appropriately concerned that home PT may not be able to serve the needs to get him to a better state an afford him the improvement he is hopeful for.  I am currently following patient outpatient at [facility] with plans to continue ongoing support. We will plan to have virtual follow-up outpatient if he discharges to SNF rehab until he is able to return for follow-up in person.  PMT will continue to support and follow as needed. Please call team line with urgent needs.  Symptom Management:  Per Attending  Palliative Prophylaxis:  Bowel Regimen and ongoing rehab support   Additional Recommendations (Limitations, Scope, Preferences): Full Scope Treatment, No Artificial Feeding, and DNR/DNI,   Psycho-social/Spiritual:  Desire for further Chaplaincy support: no Additional Recommendations:  Ongoing goals of care discussions, Rehab  Prognosis:  Guarded- in the setting of generalized weakness, recurrent falls, decreased appetite, thrombocytopenia, pancytopenia, hypotension, physical deconditioned, metastatic SCLC, and AKI.   Discharge Planning:  [place] for rehab with Palliative care service follow-up   Discussed with: Dr. Sherral Hammers.     Patient expressed understanding and was in agreement with this plan.    Time Total: 55 min.   Visit consisted of counseling and education dealing with the complex and emotionally intense issues of symptom management and palliative care in the setting of serious and potentially life-threatening illness.Greater than 50%  of this time was spent counseling and coordinating care related to the above assessment and plan.  Signed by:  Alda Lea, AGPCNP-BC Palliative Medicine Team  Phone: [phone] Pager: [phone] Amion: Bjorn Pippin    Thank you for allowing the Palliative Medicine Team to assist in the care of this patient. Please utilize secure chat with additional questions, if there is no response within 30 minutes please call the above phone number. Palliative Medicine Team providers are available by phone from 7am to 5pm daily and can be reached through the team cell phone.  Should this patient require assistance outside of these hours, please call the patient's attending physician.

## 2021-09-17 NOTE — Progress Notes (Signed)
Physical Therapy  Orthostatic BP's  Supine      BP 94/61  HR 84 EOB          BP 97/65  HR 102 Standing   BP 77/52  HR 107 Amb 20 feet  BP 74/55  HR 113 Amb 50 feet   BP 77/53  HR 102  Pt ASYMPTOMATIC throughout.  Tolerated distance well.  Min Assist for balance and weakness.  Still rec SNF.  See PN note.  Rica Koyanagi  PTA Acute  Rehabilitation Services Pager      [phone] Office      [phone]

## 2021-09-17 NOTE — TOC Progression Note (Signed)
Transition of Care [facility]) - Progression Note    Patient Details  Name: Gregory Hodges MRN: [id number] Date of Birth: 03/23/1941  Transition of [facility]) CM/SW Contact  Leeroy Cha, RN Phone Number: 09/17/2021, 9:18 AM  Clinical Narrative:    Precert and authorization for [facility] started with health team advantage.  They are requesting a palliative care consult.  Due to history, will let Dr. Sherral Hammers be aware of this.   Expected Discharge Plan: Home/Self Care Barriers to Discharge: Continued Medical Work up  Expected Discharge Plan and Services Expected Discharge Plan: Home/Self Care   Discharge Planning Services: CM Consult   Living arrangements for the past 2 months: Single Family Home                                       Social Determinants of Health (SDOH) Interventions    Readmission Risk Interventions No flowsheet data found.

## 2021-09-18 DIAGNOSIS — E46 Unspecified protein-calorie malnutrition: Secondary | ICD-10-CM

## 2021-09-18 DIAGNOSIS — E2740 Unspecified adrenocortical insufficiency: Secondary | ICD-10-CM | POA: Diagnosis present

## 2021-09-18 DIAGNOSIS — C3491 Malignant neoplasm of unspecified part of right bronchus or lung: Secondary | ICD-10-CM

## 2021-09-18 LAB — METHYLMALONIC ACID, SERUM: Methylmalonic Acid, Quantitative: 232 nmol/L (ref 0–378)

## 2021-09-18 LAB — CBC WITH DIFFERENTIAL/PLATELET
Abs Immature Granulocytes: 0.02 10*3/uL (ref 0.00–0.07)
Basophils Absolute: 0 10*3/uL (ref 0.0–0.1)
Basophils Relative: 0 %
Eosinophils Absolute: 0 10*3/uL (ref 0.0–0.5)
Eosinophils Relative: 1 %
HCT: 26 % — ABNORMAL LOW (ref 39.0–52.0)
Hemoglobin: 8.6 g/dL — ABNORMAL LOW (ref 13.0–17.0)
Immature Granulocytes: 1 %
Lymphocytes Relative: 4 %
Lymphs Abs: 0.1 10*3/uL — ABNORMAL LOW (ref 0.7–4.0)
MCH: 31.9 pg (ref 26.0–34.0)
MCHC: 33.1 g/dL (ref 30.0–36.0)
MCV: 96.3 fL (ref 80.0–100.0)
Monocytes Absolute: 0.3 10*3/uL (ref 0.1–1.0)
Monocytes Relative: 11 %
Neutro Abs: 2.3 10*3/uL (ref 1.7–7.7)
Neutrophils Relative %: 83 %
Platelets: 78 10*3/uL — ABNORMAL LOW (ref 150–400)
RBC: 2.7 MIL/uL — ABNORMAL LOW (ref 4.22–5.81)
RDW: 14.9 % (ref 11.5–15.5)
WBC: 2.8 10*3/uL — ABNORMAL LOW (ref 4.0–10.5)
nRBC: 0 % (ref 0.0–0.2)

## 2021-09-18 LAB — COMPREHENSIVE METABOLIC PANEL
ALT: 7 U/L (ref 0–44)
AST: 11 U/L — ABNORMAL LOW (ref 15–41)
Albumin: 3.4 g/dL — ABNORMAL LOW (ref 3.5–5.0)
Alkaline Phosphatase: 32 U/L — ABNORMAL LOW (ref 38–126)
Anion gap: 5 (ref 5–15)
BUN: 16 mg/dL (ref 8–23)
CO2: 26 mmol/L (ref 22–32)
Calcium: 8.3 mg/dL — ABNORMAL LOW (ref 8.9–10.3)
Chloride: 107 mmol/L (ref 98–111)
Creatinine, Ser: 1 mg/dL (ref 0.61–1.24)
GFR, Estimated: >60 mL/min (ref >60)
Glucose, Bld: 101 mg/dL — ABNORMAL HIGH (ref 70–99)
Potassium: 3.7 mmol/L (ref 3.5–5.1)
Sodium: 138 mmol/L (ref 135–145)
Total Bilirubin: 0.7 mg/dL (ref 0.3–1.2)
Total Protein: 5 g/dL — ABNORMAL LOW (ref 6.5–8.1)

## 2021-09-18 LAB — PHOSPHORUS: Phosphorus: 2.9 mg/dL (ref 2.5–4.6)

## 2021-09-18 LAB — MAGNESIUM: Magnesium: 1.7 mg/dL (ref 1.7–2.4)

## 2021-09-18 LAB — CORTISOL-AM, BLOOD: Cortisol - AM: 2.1 ug/dL — ABNORMAL LOW (ref 6.7–22.6)

## 2021-09-18 MED ORDER — POTASSIUM CHLORIDE CRYS ER 20 MEQ PO TBCR
40.0000 meq | EXTENDED_RELEASE_TABLET | Freq: Once | ORAL | Status: AC
  Administered 2021-09-18: 10:00:00 40 meq via ORAL
  Filled 2021-09-18: qty 2

## 2021-09-18 MED ORDER — MAGNESIUM SULFATE 2 GM/50ML IV SOLN
2.0000 g | Freq: Once | INTRAVENOUS | Status: AC
  Administered 2021-09-18: 10:00:00 2 g via INTRAVENOUS
  Filled 2021-09-18: qty 50

## 2021-09-18 MED ORDER — HYDROCORTISONE 5 MG PO TABS
5.0000 mg | ORAL_TABLET | Freq: Every day | ORAL | Status: DC
  Administered 2021-09-18 – 2021-09-22 (×5): 5 mg via ORAL
  Filled 2021-09-18 (×6): qty 1

## 2021-09-18 NOTE — Progress Notes (Signed)
HEMATOLOGY-ONCOLOGY PROGRESS NOTE  SUBJECTIVE: Gregory Hodges is followed by our office for extensive stage small cell lung cancer.  He started systemic chemotherapy with carboplatin for an AUC of 5 on day 1, etoposide 100 mg per metered squared on days 1, 2, and 3, with Cosela 240 mg per metered squared before chemotherapy in addition to Imfinzi 1500 mg IV every 3 weeks with day one of the chemotherapy.  His first dose of chemotherapy was given on 12/24/2020.  He is status post 11 cycles of treatment.  Starting cycle #5 he has been receiving maintenance treatment with Imfinzi every 4 weeks.  Now admitted to the hospital due to generalized weakness.  He was also having difficulty with ambulation and multiple falls.  He was seen by physical therapy and initially they recommended home health physical therapy but family had significant concerns for safe return to home due to the recurrent falls.  PT/OT has reevaluated and are now recommending SNF.  He continues to have orthostasis.  Dietitian following due to poor appetite/weight loss.  I met with the patient in his hospital room today.  He is frustrated that he is still in the hospital.  He hopes to go to SNF for rehab.  He does not feel he can go home in his current condition.  States that he does have some intermittent dizziness from time to time.  He was able to ambulate in the hallway yesterday.  He offers no other complaints this morning.  Oncology History  Small cell lung cancer, right ([place])  12/03/2020 Initial Diagnosis   Small cell lung cancer, right ([place])   12/03/2020 Cancer Staging   Staging form: Lung, AJCC 8th Edition - Clinical: Stage IVB (cT2a, cN2, cM1c) - Signed by Curt Bears, MD on 12/03/2020    12/31/2020 -  Chemotherapy   Patient is on Treatment Plan : LUNG SMALL CELL EXTENSIVE STAGE Durvalumab + Carboplatin D1 + Etoposide D1-3 q21d x 4 Cycles / Durvalumab q28d        REVIEW OF SYSTEMS:   Constitutional: Denies fevers, chills  Eyes:  Denies blurriness of vision Ears, nose, mouth, throat, and face: Denies mucositis or sore throat Respiratory: Denies cough, dyspnea or wheezes Cardiovascular: Denies palpitation, chest discomfort Gastrointestinal:  Denies nausea, heartburn or change in bowel habits Skin: Denies abnormal skin rashes Lymphatics: Denies new lymphadenopathy or easy bruising Neurological: Reports intermittent dizziness Behavioral/Psych: Mood is stable, no new changes  Extremities: No lower extremity edema All other systems were reviewed with the patient and are negative.  I have reviewed the past medical history, past surgical history, social history and family history with the patient and they are unchanged from previous note.   PHYSICAL EXAMINATION: ECOG PERFORMANCE STATUS: 2 - Symptomatic, <50% confined to bed  Vitals:   09/17/21 2021 09/18/21 0601  BP: 97/61 127/70  Pulse: 74 65  Resp: 18 18  Temp: 99.6 F (37.6 C) 98.7 F (37.1 C)  SpO2: 94% 91%   Filed Weights   09/16/21 0539 09/17/21 0500 09/18/21 0601  Weight: 66.2 kg 66.9 kg 66.4 kg    Intake/Output from previous day: 12/21 0701 - 12/22 0700 In: 3748.8 [P.O.:840; I.V.:2908.8] Out: 2900 [Urine:2900]  GENERAL:alert, no distress and comfortable SKIN: skin color, texture, turgor are normal, no rashes or significant lesions EYES: normal, Conjunctiva are pink and non-injected, sclera clear OROPHARYNX:no exudate, no erythema and lips, buccal mucosa, and tongue normal  LUNGS: clear to auscultation and percussion with normal breathing effort HEART: regular rate & rhythm  and no murmurs and no lower extremity edema ABDOMEN:abdomen soft, non-tender and normal bowel sounds NEURO: alert & oriented x 3 with fluent speech, no focal motor/sensory deficits  LABORATORY DATA:  I have reviewed the data as listed CMP Latest Ref Rng & Units 09/18/2021 09/17/2021 09/16/2021  Glucose 70 - 99 mg/dL 101(H) 106(H) 87  BUN 8 - 23 mg/dL $Remove'16 19 19  'hXvAnMf$ Creatinine  0.61 - 1.24 mg/dL 1.00 1.13 1.24  Sodium 135 - 145 mmol/L 138 137 136  Potassium 3.5 - 5.1 mmol/L 3.7 3.8 3.6  Chloride 98 - 111 mmol/L 107 105 108  CO2 22 - 32 mmol/L $RemoveB'26 26 23  'TLvGzfpC$ Calcium 8.9 - 10.3 mg/dL 8.3(L) 7.9(L) 7.7(L)  Total Protein 6.5 - 8.1 g/dL 5.0(L) 4.7(L) 4.6(L)  Total Bilirubin 0.3 - 1.2 mg/dL 0.7 0.7 0.7  Alkaline Phos 38 - 126 U/L 32(L) 39 37(L)  AST 15 - 41 U/L 11(L) 11(L) 13(L)  ALT 0 - 44 U/L $Remo'7 8 8    'fQgfd$ Lab Results  Component Value Date   WBC 2.8 (L) 09/18/2021   HGB 8.6 (L) 09/18/2021   HCT 26.0 (L) 09/18/2021   MCV 96.3 09/18/2021   PLT 78 (L) 09/18/2021   NEUTROABS 2.3 09/18/2021    DG Pelvis 1-2 Views  Result Date: 09/13/2021 CLINICAL DATA:  Pain after fall. EXAM: PELVIS - 1-2 VIEW COMPARISON:  None. FINDINGS: There is no evidence of pelvic fracture or diastasis. No pelvic bone lesions are seen. IMPRESSION: Negative. Electronically Signed   By: Dorise Bullion III M.D.   On: 09/13/2021 17:56   DG Forearm Left  Result Date: 09/14/2021 CLINICAL DATA:  Fall on the floor. Abrasion on the posterior aspect of the left elbow EXAM: LEFT FOREARM - 2 VIEW COMPARISON:  None. FINDINGS: There is no evidence of fracture or other focal bone lesions. Soft tissues are unremarkable. IMPRESSION: No acute fracture or dislocation. Electronically Signed   By: Keane Police D.O.   On: 09/14/2021 17:27   CT HEAD WO CONTRAST (5MM)  Result Date: 09/14/2021 CLINICAL DATA:  Recent fall, initial encounter EXAM: CT HEAD WITHOUT CONTRAST TECHNIQUE: Contiguous axial images were obtained from the base of the skull through the vertex without intravenous contrast. COMPARISON:  09/09/2021 FINDINGS: Brain: No evidence of acute infarction, hemorrhage, hydrocephalus, extra-axial collection or mass lesion/mass effect. Chronic atrophic and ischemic changes are noted and stable from the prior exam. Vascular: No hyperdense vessel or unexpected calcification. Skull: Normal. Negative for fracture or focal  lesion. Sinuses/Orbits: No acute finding. Other: None. IMPRESSION: Chronic atrophic and ischemic changes without acute abnormality. Electronically Signed   By: Inez Catalina M.D.   On: 09/14/2021 18:13   CT Head Wo Contrast  Result Date: 09/09/2021 CLINICAL DATA:  [place], hit head EXAM: CT HEAD WITHOUT CONTRAST TECHNIQUE: Contiguous axial images were obtained from the base of the skull through the vertex without intravenous contrast. COMPARISON:  08/29/2021 FINDINGS: Brain: No acute infarct or hemorrhage. Lateral ventricles and midline structures are stable. Chronic small vessel ischemic changes are again seen throughout the periventricular and subcortical white matter. No acute extra-axial fluid collections. No mass effect. Vascular: Stable atherosclerosis.  No hyperdense vessel. Skull: Normal. Negative for fracture or focal lesion. Sinuses/Orbits: No acute finding. Other: None. IMPRESSION: 1. Stable head CT, no acute intracranial process. Electronically Signed   By: Randa Ngo M.D.   On: 09/09/2021 16:55   CT Head Wo Contrast  Result Date: 08/29/2021 CLINICAL DATA:  Mental status change. Leg weakness for 2  days. History of small-cell lung cancer. EXAM: CT HEAD WITHOUT CONTRAST TECHNIQUE: Contiguous axial images were obtained from the base of the skull through the vertex without intravenous contrast. COMPARISON:  MR head 05/26/2021 FINDINGS: Brain: No evidence of acute infarction, hemorrhage, hydrocephalus, extra-axial collection or mass lesion/mass effect. Hypodensities in the periventricular and subcortical white matter, consistent with chronic small-vessel ischemic changes. Vascular: No hyperdense vessel or unexpected calcification. Skull: Normal. Negative for fracture or focal lesion. Sinuses/Orbits: No acute finding. Other: None. IMPRESSION: No acute intracranial abnormality. Electronically Signed   By: Sherron Ales M.D.   On: 08/29/2021 10:56   CT Cervical Spine Wo Contrast  Result Date:  09/09/2021 CLINICAL DATA:  [place], hit head EXAM: CT CERVICAL SPINE WITHOUT CONTRAST TECHNIQUE: Multidetector CT imaging of the cervical spine was performed without intravenous contrast. Multiplanar CT image reconstructions were also generated. COMPARISON:  None. FINDINGS: Alignment: There is reversal of cervical lordosis centered at C4, likely due to multilevel facet hypertrophy and spondylosis. Otherwise alignment is anatomic. Skull base and vertebrae: No acute fracture. No primary bone lesion or focal pathologic process. Soft tissues and spinal canal: No prevertebral fluid or swelling. No visible canal hematoma. Marked atherosclerosis at the carotid bifurcations. Right internal jugular catheter partially visualized. Disc levels: Prominent hypertrophic changes at the C1-C2 interface. Mild spondylosis from C3 through C6. Prominent facet hypertrophic changes at C2-3 and C3-4. Upper chest: Airway is patent. Emphysematous changes are seen at the lung apices. Stable 4 mm left apical nodule. Other: Reconstructed images demonstrate no additional findings. IMPRESSION: 1. No acute cervical spine fracture. 2. Multilevel spondylosis and facet hypertrophy. Electronically Signed   By: Sharlet Salina M.D.   On: 09/09/2021 16:57   MR Brain W and Wo Contrast  Result Date: 09/13/2021 CLINICAL DATA:  Weakness and fatigue, history of lung cancer EXAM: MRI HEAD WITHOUT AND WITH CONTRAST TECHNIQUE: Multiplanar, multiecho pulse sequences of the brain and surrounding structures were obtained without and with intravenous contrast. CONTRAST:  3mL GADAVIST GADOBUTROL 1 MMOL/ML IV SOLN COMPARISON:  CT head 09/09/2021, MR head 05/16/2021 FINDINGS: Brain: There is no evidence of acute intracranial hemorrhage, extra-axial fluid collection, or acute infarct. There is mild-to-moderate global parenchymal volume loss. Patchy FLAIR signal abnormality throughout the subcortical and periventricular white matter likely reflects sequela of moderate  chronic white matter microangiopathy. Foci of SWI signal dropout in the left frontal lobe anteriorly and left temporal lobe laterally are unchanged consistent with old blood products probably related to prior metastatic disease. The previously seen enhancing lesion in the right cerebellar hemisphere is no longer identified. SWI signal dropout is seen in this location consistent with old blood products related to the previously metastatic lesion. The previously seen enhancing lesion in the medial right temporal lobe is also no longer identified. There was likely a lesion n in the medial right frontal lobe on the prior study which is also no longer present. There is no evidence of active intracranial metastatic disease on the current study. Vascular: Normal flow voids. Skull and upper cervical spine: Normal marrow signal. Sinuses/Orbits: The paranasal sinuses are clear. The globes and orbits are unremarkable. Other: None. IMPRESSION: 1. Previously seen enhancing lesions in the right cerebellar hemisphere, right frontal lobe, and right medial temporal lobe are no longer identified. No evidence of active intracranial metastatic disease on the current study. 2. No other acute intracranial pathology. 3. Unchanged global parenchymal volume loss and moderate chronic white matter microangiopathy. Electronically Signed   By: Selena Lesser.D.  On: 09/13/2021 19:04   DG Chest Port 1 View  Result Date: 09/13/2021 CLINICAL DATA:  Altered level of consciousness. EXAM: PORTABLE CHEST 1 VIEW COMPARISON:  Chest x-ray 08/29/2021. FINDINGS: Right chest port catheter tip projects over the mid SVC, unchanged. The cardiomediastinal silhouette is stable and within normal limits. Large hiatal hernia is again noted. The lungs are clear. There is no pleural effusion or pneumothorax. No acute fractures are seen. IMPRESSION: 1. No active disease. 2. Stable large hiatal hernia. Electronically Signed   By: Ronney Asters M.D.   On:  09/13/2021 15:17   DG Chest Portable 1 View  Result Date: 08/29/2021 CLINICAL DATA:  Altered mental status. EXAM: PORTABLE CHEST 1 VIEW COMPARISON:  Chest radiograph 01/23/2021; CT chest 08/11/2021 FINDINGS: Right IJ power injectable port central venous catheter tip projects at the level of the mid SVC. The heart size and mediastinal contours are within normal limits. Aortic calcifications. Both lungs are clear. Hiatal hernia. Sclerotic lesion an old fracture of the posterior right seventh rib, better appreciated on recent cross-sectional imaging. No acute osseous abnormality. Surgical clips overlie the upper chest wall bilaterally. IMPRESSION: 1. No acute cardiopulmonary abnormality. 2. Sclerotic lesion and healing fracture of the posterior right seventh rib, better appreciated on recent cross-sectional imaging. 3. Aortic Atherosclerosis (ICD10-I70.0). 4. Hiatal hernia, stable. Electronically Signed   By: Ileana Roup M.D.   On: 08/29/2021 09:51   DG Hand Complete Right  Result Date: 09/09/2021 CLINICAL DATA:  Fall, hand pain, laceration EXAM: RIGHT HAND - COMPLETE 3+ VIEW COMPARISON:  None. FINDINGS: No acute bony abnormality. Specifically, no fracture, subluxation, or dislocation. Joint spaces maintained. Old ulnar styloid fracture. Soft tissues are intact. IMPRESSION: No acute bony abnormality. Electronically Signed   By: Rolm Baptise M.D.   On: 09/09/2021 17:29   DG Hip Unilat W or Wo Pelvis 2-3 Views Left  Result Date: 09/13/2021 CLINICAL DATA:  Pain with weight-bearing. EXAM: DG HIP (WITH OR WITHOUT PELVIS) 2-3V LEFT COMPARISON:  None. FINDINGS: There is no evidence of hip fracture or dislocation. Mild degenerative changes are noted in the form of acetabular sclerosis. Marked severity vascular calcification is seen. IMPRESSION: 1. No acute fracture or dislocation. 2. Mild degenerative changes. Electronically Signed   By: Virgina Norfolk M.D.   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  The patient is currently undergoing systemic chemotherapy with carboplatin for AUC of 5 on day 1, etoposide 100 Mg/M2 on days 1, 2 and 3 with Cosela on the days of the chemotherapy as well as Imfinzi every 3 weeks.  Status post 11 cycles.  Starting from cycle #5 the patient is on maintenance treatment with Imfinzi 1500 Mg IV every 4 weeks. He completed whole brain irradiation under the care of Dr. Lisbeth Renshaw.  He is now admitted for generalized weakness.  He is also having orthostasis.  He is awaiting placement at SNF.  I reviewed his oncologic history with him as well as recommendations from his medical oncologist.  We discussed the  incurable nature of his malignancy and that treatment is palliative.  However, he has been doing well with treatment overall with the exception of weakness and poor appetite and would benefit from short-term rehab.  We also plan to have our dietitian follow him at the cancer center.  We recommend that he proceed with his restaging CT of the chest/abdomen/pelvis as previously scheduled in early January 2023.  If he is stable to improved disease, we plan to continue maintenance Imfinzi.  We also discussed his MRI of the brain results which showed that the previously treated lesions were not evident on the current study.  He will continue to follow-up with radiation oncology.  I am unclear as to why he is having intermittent dizziness.  There is no clear cause noted on the MRI.  Again, I  think he would benefit from short-term rehab.   Future Appointments  Date Time Provider [place]  10/03/2021 12:30 PM [facility] 1 [facility] [place]  10/08/2021 11:00 AM [facility] [place] FLUSH [facility] None  10/08/2021 11:30 AM Heilingoetter, Cassandra L, PA-C [facility] None  10/08/2021 12:30 PM [facility] INFUSION [facility] None  10/08/2021 12:45 PM [facility] PALLIATIVE CARE [facility] None  10/08/2021  1:45 PM Morrell Riddle, RD [facility] None  11/05/2021 10:45 AM [facility] [place] FLUSH [facility] None  11/05/2021 11:15 AM Curt Bears, MD [facility] None  11/05/2021 12:00 PM [facility] INFUSION [facility] None  11/26/2021 11:00 AM [facility] [place] FLUSH [facility] None  11/26/2021 11:30 AM Heilingoetter, Cassandra L, PA-C [facility] None  11/26/2021 12:15 PM [facility] INFUSION [facility] None      LOS: 3 days   Mikey Bussing, DNP, AGPCNP-BC, AOCNP 09/18/21

## 2021-09-18 NOTE — Progress Notes (Signed)
Occupational Therapy Treatment Patient Details Name: Gregory Hodges MRN: [id number] DOB: 05/09/1941 Today's Date: 09/18/2021   History of present illness DEVARIO BUCKLEW is a 80 y.o. 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 Min assist for mild LOBs with ambulation with walker (to the right) and verbal cues on technique to use walker. Patient verbalized understanding of all education.    Recommendations for follow up therapy are one component of a multi-disciplinary discharge planning process, led by the attending physician.  Recommendations may be updated based on patient status, additional functional criteria and insurance authorization.    Follow Up Recommendations  Skilled nursing-short term rehab (<3 hours/day)    Assistance Recommended at Discharge    Equipment Recommendations  None recommended by OT    Recommendations for Other Services      Precautions / Restrictions Precautions Precautions: Fall Precaution Comments: orthostatic Restrictions Weight Bearing Restrictions: No       Mobility Bed Mobility Overal bed mobility: Needs Assistance Bed Mobility: Supine to Sit     Supine to sit: Supervision;HOB elevated          Transfers   Equipment used: Rolling walker (2 wheels) Transfers: Sit to/from Stand Sit to Stand: Min assist           General transfer comment: MIn assist to stand with verbal cues for technique. Predominantly min guard with RW to ambulate in hall with some min assist to correct mild LOB to the right. Verbal cues to hand and feet placement with walker.     Balance Overall balance  assessment: Needs assistance Sitting-balance support: No upper extremity supported;Feet supported Sitting balance-Leahy Scale: Good   Postural control: Right lateral lean Standing balance support: Bilateral upper extremity supported;Reliant on assistive device for balance Standing balance-Leahy Scale: Poor                             ADL either performed or assessed with clinical judgement   ADL                                              Extremity/Trunk Assessment              Vision Baseline Vision/History: 1 Wears glasses Patient Visual Report: No change from baseline     Perception     Praxis      Cognition Arousal/Alertness: Awake/alert Behavior During Therapy: WFL for tasks assessed/performed Overall Cognitive Status: Within Functional Limits for tasks assessed                                            Exercises Other Exercises Other Exercises: Encouarged to perform hip hikes, knee extension and UE movement while seated in recliner.   Shoulder Instructions       General Comments      Pertinent Vitals/ Pain       Pain Assessment: No/denies pain  Home  Living                                          Prior Functioning/Environment              Frequency  Min 2X/week        Progress Toward Goals  OT Goals(current goals can now be found in the care plan section)  Progress towards OT goals: Progressing toward goals  Acute Rehab OT Goals Patient Stated Goal: get stronger and go home OT Goal Formulation: With patient Time For Goal Achievement: 09/30/21 Potential to Achieve Goals: Good  Plan Discharge plan remains appropriate    Co-evaluation                 AM-PAC OT "6 Clicks" Daily Activity     Outcome Measure   Help from another person eating meals?: None Help from another person taking care of personal grooming?: A Little Help from another person toileting,  which includes using toliet, bedpan, or urinal?: A Little Help from another person bathing (including washing, rinsing, drying)?: A Lot Help from another person to put on and taking off regular upper body clothing?: A Little Help from another person to put on and taking off regular lower body clothing?: A Lot 6 Click Score: 17    End of Session Equipment Utilized During Treatment: Rolling walker (2 wheels)  OT Visit Diagnosis: Unsteadiness on feet (R26.81);Other abnormalities of gait and mobility (R26.89);Muscle weakness (generalized) (M62.81)   Activity Tolerance Patient tolerated treatment well   Patient Left with call bell/phone within reach;with chair alarm set;in chair   Nurse Communication Mobility status        Time: 1424-1440 OT Time Calculation (min): 16 min  Charges: OT General Charges $OT Visit: 1 Visit OT Treatments $Therapeutic Activity: 8-22 mins  Derl Barrow, OTR/L [place]  Office [phone] Pager: [place] 09/18/2021, 2:55 PM

## 2021-09-18 NOTE — TOC Progression Note (Signed)
Transition of Care [facility]) - Progression Note    Patient Details  Name: Gregory Hodges MRN: [id number] Date of Birth: 08/09/1941  Transition of [facility]) CM/SW Contact  Leeroy Cha, RN Phone Number: 09/18/2021, 9:38 AM  Clinical Narrative:    [facility].  What are patient;s short and long term goals if patient fails rehab. Spoke with the wife and Patient, Patient wants to get stronger so he can go home and spend what time he has with his family and be at the most optimum level of being.  Patient is very upset that he has not been approved for rehab and does not understand because he is will to cooperate with therapy.Patient has not been on chemo for 6 months according to Dr. Julien Nordmann.   Tct-Tammy message left to please call back.  Expected Discharge Plan: Home/Self Care Barriers to Discharge: Continued Medical Work up  Expected Discharge Plan and Services Expected Discharge Plan: Home/Self Care   Discharge Planning Services: CM Consult   Living arrangements for the past 2 months: Single Family Home                                       Social Determinants of Health (SDOH) Interventions    Readmission Risk Interventions No flowsheet data found.

## 2021-09-18 NOTE — Progress Notes (Signed)
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  Patient had reported 1 to 2 weeks of generalized weakness and absence of any acute focal weakness.  He had generalized weakness with difficulty with independent completion of his ADLs with progressive difficulty ambulation resulting in multiple falls.  Yesterday's fall he struck the floor with his left hip and had sharp left nonradiating hip discomfort.  He was able to wear weight on the left lower extremity but had no exacerbation of his left hip.  The falls were not associate with any loss of consciousness and patient admits that he did not hit his head.  He is on daily aspirin and blood thinners at home and PT was placed yesterday by his outpatient provider along with a four-wheel walker although it has not been delivered yet.  Given his recurrent weakness and recurrent falls and brought into the hospital for observation.  Patient improved and PT OT recommending home health however patient's family had significant concerns for safe returning home given that patient's recurrent falls and because he remains somewhat altered.  Psychiatry was consulted for capacity evaluation.  Patient was given IV fluid hydration with improvement in his renal function.  He had underwent work-up in the ED and an EKG as well as an MRI of the brain which showed that the enhancing lesions in the right cerebral hemisphere, right frontal lobe and right medial temporal lobe were no longer identified and there is no evidence of any intracranial metastatic disease.   The EDP discussed with on-call oncologist who recommended admission for generalized weakness.  He was given a liter normal saline bolus and then placed on continuous normal saline at 125 mL/hr and then reduce to 75 MLS per hour.   Upon reevaluation today patient was orthostatic and was given another normal saline bolus and TED hose.  Psychiatry evaluated and deemed the patient have capacity to make medical decisions.  PT OT reevaluating and now recommending SNF.  Patient is now agreeable to SNF.  His labs are improving and he continues to be significantly weak and orthostatic so we will repeat his orthostatic vital signs again in the morning continue maintenance IV fluid hydration for now.  Nutritionist evaluation still pending   Patient's renal function is relatively stable and his metabolic acidosis is improved   Subjective: 12/22 A/O x4, resting comfortably in bed.    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 Alfredia Ferguson oncology was consulted and recommended observation in hospital, however there is no oncology note in the chart for this admission. - 12/21 we will contact oncology in a.m. patient still pancytopenic.  Given that patient is being discharged to  SNF and is immunocompromised are they comfortable.  Neupogen prior to discharge?  Patient feels comfortable with this plan  Generalized weakness with recurrent falls and physical deconditioning -Multifactorial metastatic cancer to brain, deconditioning, chemotherapy, radiation therapy to brain. -He has  had 1 to 2 weeks of progressive generalized weakness and absence of any focal neurologic deficit -MRI brain performed as above with no acute ischemic infarct and showed improvement in his metastatic processes -PT/OT recommends SNF- -Orthostatic vital signs were checked; patient orthostatic. - 12/21 orthostatic vitals every shift - 12/21 given mets to adrenal glands a.m. cortisol pending -TSH was normal -12/19 psychiatry states patient has capacity to make medical decisions   Recurrent falls with unwitnessed fall in the ED -Patient has had progressive generalized weakness and recurrent falls -MRI brain as above -Had a unwitnessed fall in the ED so we will obtain a stat head CT scan -He has metastatic disease with a consequence of going under chemotherapy.  Likely his falls have been in the setting of dehydration given his poor p.o. intake and so we will give him IV fluid hydration and continue and given another liter bolus -Continue with fall precautions and urinalysis was unremarkable -TSH was unremarkable -Follow-up on repeat head CT scan for fall and showed no acute intracranial abnormality -PT OT now recommending SNF and he is agreeable  Orthostatic hypotension - Essential HTN.  DC all BP medication -Check orthostatic vital signs and they were positive - See dehydration - 12/21 albumin 50 g x 1  Adrenal insufficiency - 12/22 AM cortisol= 2.1 low - Most likely secondary to mets to his adrenal glands - 12/22 Hydrocortisone 5 mg daily  -12/21 we will await findings of a.m. cortisol: If low start Solu-Cortef   Agitation mild confusion, improved -12/21 resolved  [place] metabolic acidosis -75/64 resolved - Strict in and out - Daily weight  Protein calorie malnutrition with weight loss that is unintentional -Has been on Megace and diagnosed with small cell cancer -Estimated body mass index is 21.24 kg/m as calculated from the following:   Height as of this encounter: 5' 9.5"  (1.765 m).   Weight as of this encounter: 66.2 kg. -Nutrition was consulted and prealbumin was normal    Dehydration -Has had poor p.o. intake -12/21 DC sodium bicarb -12/21 normal saline 11m/hr  AKI -Improving in the setting of IV fluid hydration -Patient's BUNs/creatinine went from 36/1.41 -> 29/1.14 -> -> 26/1.05 -> and is now 19/1.24 -Continue with IV fluid hydration as above -Nephrotoxic medications, contrast dyes, hypotension and renally dose medications - Lab Results  Component Value Date   CREATININE 1.00 09/18/2021   CREATININE 1.13 09/17/2021   CREATININE 1.24 09/16/2021   CREATININE 1.05 09/15/2021   CREATININE 1.14 09/14/2021     Hyperlipidemia -Continue with home statin   GERD -Continue PPI   Normocytic anemia, chronic(baseline HgB10-13) -Check anemia panel in the AM  -Continue to monitor for signs and symptoms bleeding; currently no overt bleeding noted Lab Results  Component Value Date   HGB 8.6 (L) 09/18/2021   HGB 9.3 (L) 09/17/2021   HGB 9.2 (L) 09/16/2021   HGB 9.7 (L) 09/15/2021   HGB 11.6 (L) 09/14/2021     Thrombocytopenia -Patient's platelet count went from 111 -> 95 -> 87 -Continue to monitor for signs and symptoms of bleeding; currently no overt bleeding noted   Pancytopenia -All the patient's counts are down today and WBC is 3.0, hemoglobin/hematocrit is now 9.2/27.7, and platelet count is 87 -12/22 discussed case  with Dr. Earlie Server oncology and after reviewing all current lab findings, is okay with patient being discharged to SNF.  Plan is for patient to continue immunotherapy.   Acute urinary retention -Had greater than 540 mL yesterday so Foley catheter was placed -Getting IV fluid resuscitation and maintenance  -Foley inserted and try a trial of void in the morning -12/21 strict in and out -917.9m - 12/21 Daily weight Filed Weights   09/16/21 0539 09/17/21 0500 09/18/21 0601  Weight: 66.2 kg 66.9 kg 66.4 kg     Hypokalemia -  Potassium goal> 4 - 12/22 K. Dur 40 mEq  Hypomagnesmia - Magnesium goal> 2 - Magnesium IV 2 g   Tobacco abuse -Smoking cessation counseling given and has been smoking greater then 30 years -Continue nicotine patch and nicotine gum  Goals of care - 12/21 [facility]iative Care [facility] his insurance is requesting he have a palliative care consult for snf placement. 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 -12/22 after long discussion with patient would like to be discharged home with outpatient physical therapy.  Will consult with LCSW to determine how to discharge patient with 2 to 3 weeks of outpatient PT.    DVT prophylaxis: SCD Code Status: DNR Family Communication:  Status is: Inpatient    Dispo: The patient is from: Home              Anticipated d/c is to: SNF              Anticipated d/c date is: 2 days              Patient currently is not medically stable to d/c.      Consultants:  Psychiatry  Procedures/Significant Events:     I have personally reviewed and interpreted all radiology studies and my findings are as above.  VENTILATOR SETTINGS:    Cultures   Antimicrobials:    Devices    LINES / TUBES:      Continuous Infusions:  sodium chloride 75 mL/hr at 09/18/21 1522     Objective: Vitals:   09/17/21 2021 09/18/21 0601 09/18/21 1232 09/18/21 2027  BP: 97/61 127/70 (!) 89/56 101/60  Pulse: 74 65 81 76  Resp: 18 18 18 18   Temp: 99.6 F (37.6 C) 98.7 F (37.1 C) 97.9 F (36.6 C) 98 F (36.7 C)  TempSrc: Oral Oral Oral Oral  SpO2: 94% 91% 93% 93%  Weight:  66.4 kg    Height:        Intake/Output Summary (Last 24 hours) at 09/18/2021 2055 Last data filed at 09/18/2021 1926 Gross per 24 hour  Intake 1953.75 ml  Output 2600 ml  Net -646.25 ml   Filed Weights   09/16/21 0539  09/17/21 0500 09/18/21 0601  Weight: 66.2 kg 66.9 kg 66.4 kg    Examination:  General: A/O x4 No acute respiratory distress Eyes: negative scleral hemorrhage, negative anisocoria, negative icterus ENT: Negative Runny nose, negative gingival bleeding, Neck:  Negative scars, masses, torticollis, lymphadenopathy, JVD Lungs: Clear to auscultation bilaterally without wheezes or crackles Cardiovascular: Regular rate and rhythm without murmur gallop or rub normal S1 and S2 Abdomen: negative abdominal pain, nondistended, positive soft, bowel sounds, no rebound, no ascites, no appreciable mass Extremities: No significant cyanosis, clubbing, or edema bilateral lower extremities Skin: Negative rashes, lesions, ulcers Psychiatric:  Negative depression, negative anxiety, negative fatigue, negative mania  Central nervous system:  Cranial nerves II through XII intact, tongue/uvula midline, all extremities muscle strength 5/5, sensation intact throughout, negative dysarthria, negative expressive aphasia, negative receptive aphasia.  .     Data Reviewed: Care during the described time interval was provided by me .  I have reviewed this patient's available data, including medical history, events of note, physical examination, and all test results as part of my evaluation.   CBC: Recent Labs  Lab 09/14/21 0520 09/15/21 0333 09/16/21 1142 09/17/21 0339 09/18/21 0342  WBC 5.1 3.4* 3.0* 3.2* 2.8*  NEUTROABS 4.3 2.8 2.5 2.6 2.3  HGB 11.6* 9.7* 9.2* 9.3* 8.6*  HCT 35.5* 30.2* 27.7* 28.1* 26.0*  MCV 96.7 97.1 94.9 94.9 96.3  PLT 111* 95* 87* 85* 78*   Basic Metabolic Panel: Recent Labs  Lab 09/14/21 0520 09/15/21 0333 09/16/21 1142 09/17/21 0339 09/18/21 0342  NA 139 136 136 137 138  K 4.6 4.3 3.6 3.8 3.7  CL 114* 115* 108 105 107  CO2 17* 17* 23 26 26   GLUCOSE 89 80 87 106* 101*  BUN 29* 26* 19 19 16   CREATININE 1.14 1.05 1.24 1.13 1.00  CALCIUM 8.7* 8.3* 7.7* 7.9* 8.3*  MG 2.0 1.8 1.9  1.9 1.7  PHOS  --  2.8 2.7 2.8 2.9   GFR: Estimated Creatinine Clearance: 57.2 mL/min (by C-G formula based on SCr of 1 mg/dL). Liver Function Tests: Recent Labs  Lab 09/14/21 0520 09/15/21 0333 09/16/21 1142 09/17/21 0339 09/18/21 0342  AST 14* 14* 13* 11* 11*  ALT 8 9 8 8 7   ALKPHOS 45 39 37* 39 32*  BILITOT 1.0 0.9 0.7 0.7 0.7  PROT 6.1* 4.9* 4.6* 4.7* 5.0*  ALBUMIN 3.6 2.9* 2.7* 2.7* 3.4*   No results for input(s): LIPASE, AMYLASE in the last 168 hours. No results for input(s): AMMONIA in the last 168 hours. Coagulation Profile: Recent Labs  Lab 09/14/21 0520  INR 1.2   Cardiac Enzymes: Recent Labs  Lab 09/14/21 0520  CKTOTAL 135   BNP (last 3 results) No results for input(s): PROBNP in the last 8760 hours. HbA1C: No results for input(s): HGBA1C in the last 72 hours. CBG: No results for input(s): GLUCAP in the last 168 hours. Lipid Profile: No results for input(s): CHOL, HDL, LDLCALC, TRIG, CHOLHDL, LDLDIRECT in the last 72 hours. Thyroid Function Tests: No results for input(s): TSH, T4TOTAL, FREET4, T3FREE, THYROIDAB in the last 72 hours. Anemia Panel: No results for input(s): VITAMINB12, FOLATE, FERRITIN, TIBC, IRON, RETICCTPCT in the last 72 hours. Urine analysis:    Component Value Date/Time   COLORURINE YELLOW 09/13/2021 2145   APPEARANCEUR CLOUDY (A) 09/13/2021 2145   LABSPEC 1.023 09/13/2021 2145   PHURINE 5.0 09/13/2021 2145   GLUCOSEU NEGATIVE 09/13/2021 2145   HGBUR NEGATIVE 09/13/2021 2145   BILIRUBINUR NEGATIVE 09/13/2021 2145   KETONESUR NEGATIVE 09/13/2021 2145   PROTEINUR NEGATIVE 09/13/2021 2145   UROBILINOGEN 0.2 07/22/2010 1113   NITRITE NEGATIVE 09/13/2021 2145   LEUKOCYTESUR NEGATIVE 09/13/2021 2145   Sepsis Labs: @LABRCNTIP (procalcitonin:4,lacticidven:4)  ) Recent Results (from the past 240 hour(s))  Culture, Urine     Status: None   Collection Time: 09/09/21 11:27 AM   Specimen: Urine, Clean Catch  Result Value Ref Range  Status   Specimen Description   Final    URINE, CLEAN CATCH Performed at [facility], [place] [address], [place], [place] [address]    Special Requests   Final    NONE Performed at  [facility], [place] [address], [place], [place] [address]    Culture   Final    NO GROWTH Performed at [facility], [place] [address], [place], [place] [address]    Report Status 09/10/2021 FINAL  Final  Resp Panel by RT-PCR (Flu A&B, Covid) Nasopharyngeal Swab     Status: None   Collection Time: 09/13/21  8:46 PM   Specimen: Nasopharyngeal Swab; Nasopharyngeal(NP) swabs in vial transport medium  Result Value Ref Range Status   SARS Coronavirus 2 by RT PCR NEGATIVE NEGATIVE Final    Comment: (NOTE) SARS-CoV-2 target nucleic acids are NOT DETECTED.  The SARS-CoV-2 RNA is generally detectable in upper respiratory specimens during the acute phase of infection. The lowest concentration of SARS-CoV-2 viral copies this assay can detect is 138 copies/mL. A negative result does not preclude SARS-Cov-2 infection and should not be used as the sole basis for treatment or other patient management decisions. A negative result may occur with  improper specimen collection/handling, submission of specimen other than nasopharyngeal swab, presence of viral mutation(s) within the areas targeted by this assay, and inadequate number of viral copies(<138 copies/mL). A negative result must be combined with clinical observations, patient history, and epidemiological information. The expected result is Negative.  Fact Sheet for Patients:  EntrepreneurPulse.com.au  Fact Sheet for Healthcare Providers:  IncredibleEmployment.be  This test is no t yet approved or cleared by the [place] FDA and  has been authorized for detection and/or diagnosis of SARS-CoV-2 by FDA under an Emergency Use Authorization (EUA). This EUA will remain  in  effect (meaning this test can be used) for the duration of the COVID-19 declaration under Section 564(b)(1) of the Act, 21 U.S.C.section 360bbb-3(b)(1), unless the authorization is terminated  or revoked sooner.       Influenza A by PCR NEGATIVE NEGATIVE Final   Influenza B by PCR NEGATIVE NEGATIVE Final    Comment: (NOTE) The Xpert Xpress SARS-CoV-2/FLU/RSV plus assay is intended as an aid in the diagnosis of influenza from Nasopharyngeal swab specimens and should not be used as a sole basis for treatment. Nasal washings and aspirates are unacceptable for Xpert Xpress SARS-CoV-2/FLU/RSV testing.  Fact Sheet for Patients: EntrepreneurPulse.com.au  Fact Sheet for Healthcare Providers: IncredibleEmployment.be  This test is not yet approved or cleared by the [place] FDA and has been authorized for detection and/or diagnosis of SARS-CoV-2 by FDA under an Emergency Use Authorization (EUA). This EUA will remain in effect (meaning this test can be used) for the duration of the COVID-19 declaration under Section 564(b)(1) of the Act, 21 U.S.C. section 360bbb-3(b)(1), unless the authorization is terminated or revoked.  Performed at [facility], [place] [address], [place], [place] [address]          Radiology Studies: No results found.      Scheduled Meds:  Chlorhexidine Gluconate Cloth  6 each Topical Daily   feeding supplement  1 Container Oral Q24H   feeding supplement  237 mL Oral Q24H   hydrocortisone  5 mg Oral Daily   megestrol  400 mg Oral BID   nicotine  21 mg Transdermal Daily   pantoprazole  40 mg Oral Daily   rosuvastatin  10 mg Oral QPM   sulfamethoxazole-trimethoprim  1 tablet Oral BID   Continuous Infusions:  sodium chloride 75 mL/hr at 09/18/21 1522     LOS: 3 days   The patient is critically ill with multiple organ systems failure and requires  high complexity decision making for assessment  and support, frequent evaluation and titration of therapies, application of advanced monitoring technologies and extensive interpretation of multiple databases. Critical Care Time devoted to patient care services described in this note  Time spent: 40 minutes     Kadar Chance, Geraldo Docker, MD [facility]   If 7PM-7AM, please contact night-coverage 09/18/2021, 8:55 PM

## 2021-09-18 NOTE — Care Management Important Message (Signed)
Important Message  Patient Details IM Letter placed in Patients room. Name: Gregory Hodges MRN: [id number] Date of Birth: [birth year]   Medicare Important Message Given:  Yes     Kerin Salen 09/18/2021, 11:03 AM

## 2021-09-18 NOTE — Progress Notes (Signed)
°   09/18/21 0601  Vitals  Temp 98.7 F (37.1 C)  Temp Source Oral  BP 127/70  MAP (mmHg) 84  BP Location Left Arm  BP Method Automatic  Patient Position (if appropriate) Lying  Pulse Rate 65  Pulse Rate Source Monitor  Resp 18  MEWS COLOR  MEWS Score Color Green  Orthostatic Lying   BP- Lying 127/70  Pulse- Lying 65  Orthostatic Sitting  BP- Sitting 97/61  Pulse- Sitting 68  Orthostatic Standing at 0 minutes  BP- Standing at 0 minutes 91/57  Pulse- Standing at 0 minutes 66  Orthostatic Standing at 3 minutes  BP- Standing at 3 minutes  (pt unable to stand that long this am)  Oxygen Therapy  SpO2 91 %  Height and Weight  Weight 66.4 kg  BMI (Calculated) 21.31  MEWS Score  MEWS Temp 0  MEWS Systolic 0  MEWS Pulse 0  MEWS RR 0  MEWS LOC 0  MEWS Score 0   Orthostatic vital signs performed at 0600 am by night RN. Night RN stated that patient was not able to stand for 3 minutes to check BP and HR.  Layla Maw, RN

## 2021-09-18 NOTE — TOC Progression Note (Signed)
Transition of Care [facility]) - Progression Note    Patient Details  Name: Gregory Hodges MRN: [id number] Date of Birth: 1940-10-09  [facility] of [facility] CM/SW Contact  Leeroy Cha, RN Phone Number: 09/18/2021, 8:58 AM  Clinical Narrative:    Note to Dr.v Sherral Hammers at [phone] md at [facility] wants to do a peer to peer with you this am by 10:00.  Is it to see whether or not rehab is going to help this patient as long as he is on chemotherapy.  Please call at your earliest convenience.  Number to call is [phone].   Expected Discharge Plan: Home/Self Care Barriers to Discharge: Continued Medical Work up  Expected Discharge Plan and Services Expected Discharge Plan: Home/Self Care   Discharge Planning Services: CM Consult   Living arrangements for the past 2 months: Single Family Home                                       Social Determinants of Health (SDOH) Interventions    Readmission Risk Interventions No flowsheet data found.

## 2021-09-19 ENCOUNTER — Telehealth: Payer: Self-pay

## 2021-09-19 LAB — CBC WITH DIFFERENTIAL/PLATELET
Abs Immature Granulocytes: 0.02 10*3/uL (ref 0.00–0.07)
Basophils Absolute: 0 10*3/uL (ref 0.0–0.1)
Basophils Relative: 0 %
Eosinophils Absolute: 0 10*3/uL (ref 0.0–0.5)
Eosinophils Relative: 1 %
HCT: 28.3 % — ABNORMAL LOW (ref 39.0–52.0)
Hemoglobin: 9.2 g/dL — ABNORMAL LOW (ref 13.0–17.0)
Immature Granulocytes: 1 %
Lymphocytes Relative: 3 %
Lymphs Abs: 0.1 10*3/uL — ABNORMAL LOW (ref 0.7–4.0)
MCH: 31.8 pg (ref 26.0–34.0)
MCHC: 32.5 g/dL (ref 30.0–36.0)
MCV: 97.9 fL (ref 80.0–100.0)
Monocytes Absolute: 0.4 10*3/uL (ref 0.1–1.0)
Monocytes Relative: 11 %
Neutro Abs: 2.9 10*3/uL (ref 1.7–7.7)
Neutrophils Relative %: 84 %
Platelets: 90 10*3/uL — ABNORMAL LOW (ref 150–400)
RBC: 2.89 MIL/uL — ABNORMAL LOW (ref 4.22–5.81)
RDW: 14.9 % (ref 11.5–15.5)
WBC: 3.4 10*3/uL — ABNORMAL LOW (ref 4.0–10.5)
nRBC: 0 % (ref 0.0–0.2)

## 2021-09-19 LAB — COMPREHENSIVE METABOLIC PANEL
ALT: 8 U/L (ref 0–44)
AST: 10 U/L — ABNORMAL LOW (ref 15–41)
Albumin: 3.1 g/dL — ABNORMAL LOW (ref 3.5–5.0)
Alkaline Phosphatase: 36 U/L — ABNORMAL LOW (ref 38–126)
Anion gap: 3 — ABNORMAL LOW (ref 5–15)
BUN: 22 mg/dL (ref 8–23)
CO2: 22 mmol/L (ref 22–32)
Calcium: 8.3 mg/dL — ABNORMAL LOW (ref 8.9–10.3)
Chloride: 111 mmol/L (ref 98–111)
Creatinine, Ser: 1.08 mg/dL (ref 0.61–1.24)
GFR, Estimated: >60 mL/min (ref >60)
Glucose, Bld: 103 mg/dL — ABNORMAL HIGH (ref 70–99)
Potassium: 4.9 mmol/L (ref 3.5–5.1)
Sodium: 136 mmol/L (ref 135–145)
Total Bilirubin: 0.6 mg/dL (ref 0.3–1.2)
Total Protein: 4.9 g/dL — ABNORMAL LOW (ref 6.5–8.1)

## 2021-09-19 LAB — MAGNESIUM: Magnesium: 2 mg/dL (ref 1.7–2.4)

## 2021-09-19 LAB — PHOSPHORUS: Phosphorus: 2.9 mg/dL (ref 2.5–4.6)

## 2021-09-19 MED ORDER — ALBUMIN HUMAN 25 % IV SOLN
25.0000 g | Freq: Once | INTRAVENOUS | Status: AC
  Administered 2021-09-19: 22:00:00 25 g via INTRAVENOUS
  Filled 2021-09-19: qty 50
  Filled 2021-09-19: qty 100

## 2021-09-19 NOTE — Plan of Care (Signed)
?  Problem: Clinical Measurements: ?Goal: Will remain free from infection ?Outcome: Progressing ?Goal: Diagnostic test results will improve ?Outcome: Progressing ?Goal: Respiratory complications will improve ?Outcome: Progressing ?  ?

## 2021-09-19 NOTE — Progress Notes (Signed)
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  Patient had reported 1 to 2 weeks of generalized weakness and absence of any acute focal weakness.  He had generalized weakness with difficulty with independent completion of his ADLs with progressive difficulty ambulation resulting in multiple falls.  Yesterday's fall he struck the floor with his left hip and had sharp left nonradiating hip discomfort.  He was able to wear weight on the left lower extremity but had no exacerbation of his left hip.  The falls were not associate with any loss of consciousness and patient admits that he did not hit his head.  He is on daily aspirin and blood thinners at home and PT was placed yesterday by his outpatient provider along with a four-wheel walker although it has not been delivered yet.  Given his recurrent weakness and recurrent falls and brought into the hospital for observation.  Patient improved and PT OT recommending home health however patient's family had significant concerns for safe returning home given that patient's recurrent falls and because he remains somewhat altered.  Psychiatry was consulted for capacity evaluation.  Patient was given IV fluid hydration with improvement in his renal function.  He had underwent work-up in the ED and an EKG as well as an MRI of the brain which showed that the enhancing lesions in the right cerebral hemisphere, right frontal lobe and right medial temporal lobe were no longer identified and there is no evidence of any intracranial metastatic disease.   The EDP discussed with on-call oncologist who recommended admission for generalized weakness.  He was given a liter normal saline bolus and then placed on continuous normal saline at 125 mL/hr and then reduce to 75 MLS per hour.   Upon reevaluation today patient was orthostatic and was given another normal saline bolus and TED hose.  Psychiatry evaluated and deemed the patient have capacity to make medical decisions.  PT OT reevaluating and now recommending SNF.  Patient is now agreeable to SNF.  His labs are improving and he continues to be significantly weak and orthostatic so we will repeat his orthostatic vital signs again in the morning continue maintenance IV fluid hydration for now.  Nutritionist evaluation still pending   Patient's renal function is relatively stable and his metabolic acidosis is improved   Subjective: 12/23 afebrile overnight A/O x4.  Resting comfortably in the bed.  Disappointed that he does not have a bed at [facility] today.   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 Alfredia Ferguson oncology was consulted and recommended observation in hospital, however there is no oncology note in the chart for this admission. - 12/21 we  will contact oncology in a.m. patient still pancytopenic.  Given that patient is being discharged to SNF and is immunocompromised are they comfortable.  Neupogen prior to discharge?  Patient feels comfortable with this plan  Generalized weakness with recurrent falls and physical deconditioning -Multifactorial metastatic  cancer to brain, deconditioning, chemotherapy, radiation therapy to brain. -He has had 1 to 2 weeks of progressive generalized weakness and absence of any focal neurologic deficit -MRI brain performed as above with no acute ischemic infarct and showed improvement in his metastatic processes -PT/OT recommends SNF- -Orthostatic vital signs were checked; patient orthostatic. - 12/21 orthostatic vitals every shift - 12/21 given mets to adrenal glands a.m. cortisol; consistent with adrenal insufficiency -TSH was normal -12/19 psychiatry states patient has capacity to make medical decisions   Recurrent falls with unwitnessed fall in the ED -Patient has had progressive generalized weakness and recurrent falls -MRI brain as above -Had a unwitnessed fall in the ED so we will obtain a stat head CT scan -He has metastatic disease with a consequence of going under chemotherapy.  Likely his falls have been in the setting of dehydration given his poor p.o. intake and so we will give him IV fluid hydration and continue and given another liter bolus -Continue with fall precautions and urinalysis was unremarkable -TSH was unremarkable -Follow-up on repeat head CT scan for fall and showed no acute intracranial abnormality -PT OT now recommending SNF and he is agreeable  Orthostatic hypotension - Essential HTN.  DC all BP medication -Check orthostatic vital signs and they were positive - See dehydration - 12/21 albumin 50 g x 1 -12/23 patient orthostatic on 12/22 however just started hydrocortisone. -12/23 albumin 25 g x 1  Adrenal insufficiency - 12/22 AM cortisol= 2.1 low - Most likely secondary to mets to his adrenal glands - 12/22 Hydrocortisone 5 mg daily    Agitation mild confusion, improved -12/21 resolved  [place] metabolic acidosis -28/78 resolved - Strict in and out -3.1 L - Daily weight Filed Weights   09/17/21 0500 09/18/21 0601 09/19/21 0500  Weight: 66.9 kg 66.4 kg 68.2 kg      Protein calorie malnutrition with weight loss that is unintentional -Has been on Megace and diagnosed with small cell cancer -Estimated body mass index is 21.24 kg/m as calculated from the following:   Height as of this encounter: 5' 9.5" (1.765 m).   Weight as of this encounter: 66.2 kg. -Nutrition was consulted and prealbumin was normal    Dehydration -Has had poor p.o. intake -12/21 DC sodium bicarb -12/21 normal saline 38m/hr  AKI -Improving in the setting of IV fluid hydration -Patient's BUNs/creatinine went from 36/1.41 -> 29/1.14 -> -> 26/1.05 -> and is now 19/1.24 -Continue with IV fluid hydration as above -Nephrotoxic medications, contrast dyes, hypotension and renally dose medications- Lab Results  Component Value Date   CREATININE 1.08 09/19/2021   CREATININE 1.00 09/18/2021   CREATININE 1.13 09/17/2021   CREATININE 1.24 09/16/2021   CREATININE 1.05 09/15/2021  -Resolved   Hyperlipidemia -Crestor 10 mg daily   GERD -Continue PPI   Normocytic anemia, chronic(baseline HgB10-13) -Check anemia panel in the AM  -Continue to monitor for signs and symptoms bleeding; currently no overt bleeding noted Lab Results  Component Value Date   HGB 9.2 (L) 09/19/2021   HGB 8.6 (L) 09/18/2021   HGB 9.3 (L) 09/17/2021   HGB 9.2 (L) 09/16/2021   HGB 9.7 (L) 09/15/2021     Thrombocytopenia -Patient's platelet count went from 111 ->  95 -> 87 -Continue to monitor for signs and symptoms of bleeding; currently no overt bleeding noted  Latest Reference Range & Units 09/15/21 03:33 09/16/21 11:42 09/17/21 03:39 09/18/21 03:42 09/19/21 03:46  Platelets 150 - 400 K/uL 95 (L) 87 (L) 85 (L) 78 (L) 90 (L)  (L): Data is abnormally low -Stable   Pancytopenia -All the patient's counts are down today and WBC is 3.0, hemoglobin/hematocrit is now 9.2/27.7, and platelet count is 87 -12/22 discussed case with Dr. Earlie Server oncology and after reviewing all current lab findings, is okay  with patient being discharged to SNF.  Plan is for patient to continue immunotherapy.   Acute urinary retention -Had greater than 540 mL yesterday so Foley catheter was placed -Getting IV fluid resuscitation and maintenance  -Foley inserted and try a trial of void in the morning -See metabolic acidosis    Hypokalemia - Potassium goal> 4 - 12/22 K. Dur 40 mEq  Hypomagnesmia - Magnesium goal> 2   Tobacco abuse -Smoking cessation counseling given and has been smoking greater then 30 years -Continue nicotine patch and nicotine gum  Goals of care - 12/21 Palliative Care Consult:healthteam advantage his insurance is requesting he have a palliative care consult for snf placement. 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 -12/22 after long discussion with patient would like to be discharged home with outpatient physical therapy.  Will consult with LCSW to determine how to discharge patient with 2 to 3 weeks of outpatient PT. -12/23 insurance has approved SNF, [facility] has accepted patient.  Just awaiting bed at [facility]    DVT prophylaxis: SCD Code Status: DNR Family Communication: 12/23 spoke with Thurmond Butts (wife) discussed plan of care answered all questions Status is: Inpatient    Dispo: The patient is from: Home              Anticipated d/c is to: SNF              Anticipated d/c date is: 2 days              Patient currently is not medically stable to d/c.      Consultants:  Psychiatry  Procedures/Significant Events:     I have personally reviewed and interpreted all radiology studies and my findings are as above.  VENTILATOR SETTINGS:    Cultures   Antimicrobials:    Devices    LINES / TUBES:      Continuous Infusions:  sodium chloride 75 mL/hr at 09/18/21 1522     Objective: Vitals:   09/18/21 1232 09/18/21 2027  09/19/21 0459 09/19/21 0500  BP: (!) 89/56 101/60 134/75   Pulse: 81 76 74   Resp: 18 18 18    Temp: 97.9 F (36.6 C) 98 F (36.7 C) 98.1 F (36.7 C)   TempSrc: Oral Oral Oral   SpO2: 93% 93% 95%   Weight:    68.2 kg  Height:        Intake/Output Summary (Last 24 hours) at 09/19/2021 1146 Last data filed at 09/19/2021 1610 Gross per 24 hour  Intake 1020 ml  Output 2950 ml  Net -1930 ml    Filed Weights   09/17/21 0500 09/18/21 0601 09/19/21 0500  Weight: 66.9 kg 66.4 kg 68.2 kg    Examination:  General: A/O x4 No acute respiratory distress Eyes: negative scleral hemorrhage, negative anisocoria, negative icterus ENT: Negative Runny  nose, negative gingival bleeding, Neck:  Negative scars, masses, torticollis, lymphadenopathy, JVD Lungs: Clear to auscultation bilaterally without wheezes or crackles Cardiovascular: Regular rate and rhythm without murmur gallop or rub normal S1 and S2 Abdomen: negative abdominal pain, nondistended, positive soft, bowel sounds, no rebound, no ascites, no appreciable mass Extremities: No significant cyanosis, clubbing, or edema bilateral lower extremities Skin: Negative rashes, lesions, ulcers Psychiatric:  Negative depression, negative anxiety, negative fatigue, negative mania  Central nervous system:  Cranial nerves II through XII intact, tongue/uvula midline, all extremities muscle strength 5/5, sensation intact throughout, negative dysarthria, negative expressive aphasia, negative receptive aphasia.  .     Data Reviewed: Care during the described time interval was provided by me .  I have reviewed this patient's available data, including medical history, events of note, physical examination, and all test results as part of my evaluation.   CBC: Recent Labs  Lab 09/15/21 0333 09/16/21 1142 09/17/21 0339 09/18/21 0342 09/19/21 0346  WBC 3.4* 3.0* 3.2* 2.8* 3.4*  NEUTROABS 2.8 2.5 2.6 2.3 2.9  HGB 9.7* 9.2* 9.3* 8.6* 9.2*  HCT 30.2*  27.7* 28.1* 26.0* 28.3*  MCV 97.1 94.9 94.9 96.3 97.9  PLT 95* 87* 85* 78* 90*    Basic Metabolic Panel: Recent Labs  Lab 09/15/21 0333 09/16/21 1142 09/17/21 0339 09/18/21 0342 09/19/21 0346  NA 136 136 137 138 136  K 4.3 3.6 3.8 3.7 4.9  CL 115* 108 105 107 111  CO2 17* 23 26 26 22   GLUCOSE 80 87 106* 101* 103*  BUN 26* 19 19 16 22   CREATININE 1.05 1.24 1.13 1.00 1.08  CALCIUM 8.3* 7.7* 7.9* 8.3* 8.3*  MG 1.8 1.9 1.9 1.7 2.0  PHOS 2.8 2.7 2.8 2.9 2.9    GFR: Estimated Creatinine Clearance: 54.4 mL/min (by C-G formula based on SCr of 1.08 mg/dL). Liver Function Tests: Recent Labs  Lab 09/15/21 0333 09/16/21 1142 09/17/21 0339 09/18/21 0342 09/19/21 0346  AST 14* 13* 11* 11* 10*  ALT 9 8 8 7 8   ALKPHOS 39 37* 39 32* 36*  BILITOT 0.9 0.7 0.7 0.7 0.6  PROT 4.9* 4.6* 4.7* 5.0* 4.9*  ALBUMIN 2.9* 2.7* 2.7* 3.4* 3.1*    No results for input(s): LIPASE, AMYLASE in the last 168 hours. No results for input(s): AMMONIA in the last 168 hours. Coagulation Profile: Recent Labs  Lab 09/14/21 0520  INR 1.2    Cardiac Enzymes: Recent Labs  Lab 09/14/21 0520  CKTOTAL 135    BNP (last 3 results) No results for input(s): PROBNP in the last 8760 hours. HbA1C: No results for input(s): HGBA1C in the last 72 hours. CBG: No results for input(s): GLUCAP in the last 168 hours. Lipid Profile: No results for input(s): CHOL, HDL, LDLCALC, TRIG, CHOLHDL, LDLDIRECT in the last 72 hours. Thyroid Function Tests: No results for input(s): TSH, T4TOTAL, FREET4, T3FREE, THYROIDAB in the last 72 hours. Anemia Panel: No results for input(s): VITAMINB12, FOLATE, FERRITIN, TIBC, IRON, RETICCTPCT in the last 72 hours. Urine analysis:    Component Value Date/Time   COLORURINE YELLOW 09/13/2021 2145   APPEARANCEUR CLOUDY (A) 09/13/2021 2145   LABSPEC 1.023 09/13/2021 2145   PHURINE 5.0 09/13/2021 2145   GLUCOSEU NEGATIVE 09/13/2021 2145   HGBUR NEGATIVE 09/13/2021 2145    BILIRUBINUR NEGATIVE 09/13/2021 2145   KETONESUR NEGATIVE 09/13/2021 2145   PROTEINUR NEGATIVE 09/13/2021 2145   UROBILINOGEN 0.2 07/22/2010 1113   NITRITE NEGATIVE 09/13/2021 2145   LEUKOCYTESUR NEGATIVE 09/13/2021 2145   Sepsis  Labs: @LABRCNTIP (procalcitonin:4,lacticidven:4)  ) Recent Results (from the past 240 hour(s))  Resp Panel by RT-PCR (Flu A&B, Covid) Nasopharyngeal Swab     Status: None   Collection Time: 09/13/21  8:46 PM   Specimen: Nasopharyngeal Swab; Nasopharyngeal(NP) swabs in vial transport medium  Result Value Ref Range Status   SARS Coronavirus 2 by RT PCR NEGATIVE NEGATIVE Final    Comment: (NOTE) SARS-CoV-2 target nucleic acids are NOT DETECTED.  The SARS-CoV-2 RNA is generally detectable in upper respiratory specimens during the acute phase of infection. The lowest concentration of SARS-CoV-2 viral copies this assay can detect is 138 copies/mL. A negative result does not preclude SARS-Cov-2 infection and should not be used as the sole basis for treatment or other patient management decisions. A negative result may occur with  improper specimen collection/handling, submission of specimen other than nasopharyngeal swab, presence of viral mutation(s) within the areas targeted by this assay, and inadequate number of viral copies(<138 copies/mL). A negative result must be combined with clinical observations, patient history, and epidemiological information. The expected result is Negative.  Fact Sheet for Patients:  EntrepreneurPulse.com.au  Fact Sheet for Healthcare Providers:  IncredibleEmployment.be  This test is no t yet approved or cleared by the [place] FDA and  has been authorized for detection and/or diagnosis of SARS-CoV-2 by FDA under an Emergency Use Authorization (EUA). This EUA will remain  in effect (meaning this test can be used) for the duration of the COVID-19 declaration under Section 564(b)(1) of  the Act, 21 U.S.C.section 360bbb-3(b)(1), unless the authorization is terminated  or revoked sooner.       Influenza A by PCR NEGATIVE NEGATIVE Final   Influenza B by PCR NEGATIVE NEGATIVE Final    Comment: (NOTE) The Xpert Xpress SARS-CoV-2/FLU/RSV plus assay is intended as an aid in the diagnosis of influenza from Nasopharyngeal swab specimens and should not be used as a sole basis for treatment. Nasal washings and aspirates are unacceptable for Xpert Xpress SARS-CoV-2/FLU/RSV testing.  Fact Sheet for Patients: EntrepreneurPulse.com.au  Fact Sheet for Healthcare Providers: IncredibleEmployment.be  This test is not yet approved or cleared by the [place] FDA and has been authorized for detection and/or diagnosis of SARS-CoV-2 by FDA under an Emergency Use Authorization (EUA). This EUA will remain in effect (meaning this test can be used) for the duration of the COVID-19 declaration under Section 564(b)(1) of the Act, 21 U.S.C. section 360bbb-3(b)(1), unless the authorization is terminated or revoked.  Performed at [facility], [place] [address], [place], [place] [address]          Radiology Studies: No results found.      Scheduled Meds:  Chlorhexidine Gluconate Cloth  6 each Topical Daily   feeding supplement  1 Container Oral Q24H   feeding supplement  237 mL Oral Q24H   hydrocortisone  5 mg Oral Daily   megestrol  400 mg Oral BID   nicotine  21 mg Transdermal Daily   pantoprazole  40 mg Oral Daily   rosuvastatin  10 mg Oral QPM   sulfamethoxazole-trimethoprim  1 tablet Oral BID   Continuous Infusions:  sodium chloride 75 mL/hr at 09/18/21 1522     LOS: 4 days   The patient is critically ill with multiple organ systems failure and requires high complexity decision making for assessment and support, frequent evaluation and titration of therapies, application of advanced monitoring technologies and  extensive interpretation of multiple databases. Critical Care Time devoted to patient care services described  in this note  Time spent: 40 minutes     Lucrecia Mcphearson, Geraldo Docker, MD [facility]   If 7PM-7AM, please contact night-coverage 09/19/2021, 11:46 AM

## 2021-09-19 NOTE — Progress Notes (Signed)
Physical Therapy Treatment Patient Details Name: Gregory Hodges MRN: [id number] DOB: [birth year] Today's Date: 09/19/2021   History of Present Illness Gregory Hodges is a 80 y.o. 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    PT Comments    Pt assisted with ambulating short distance in hallway.  Pt with lateral right lean upon standing and with ambulating requiring assist for stability.  Pt anticipates d/c to SNF and eager for d/c as soon as possible.   Recommendations for follow up therapy are one component of a multi-disciplinary discharge planning process, led by the attending physician.  Recommendations may be updated based on patient status, additional functional criteria and insurance authorization.  Follow Up Recommendations  Skilled nursing-short term rehab (<3 hours/day)     Assistance Recommended at Discharge Frequent or constant Supervision/Assistance  Equipment Recommendations  None recommended by PT    Recommendations for Other Services       Precautions / Restrictions Precautions Precautions: Fall Precaution Comments: orthostatic     Mobility  Bed Mobility Overal bed mobility: Needs Assistance Bed Mobility: Supine to Sit;Sit to Supine     Supine to sit: Supervision;HOB elevated Sit to supine: Supervision;HOB elevated   General bed mobility comments: increased time and effort upon return to bed    Transfers Overall transfer level: Needs assistance Equipment used: Rolling walker (2 wheels) Transfers: Sit to/from Stand Sit to Stand: Min assist           General transfer comment: verbal cues for hand placement, assist to rise and steady    Ambulation/Gait Ambulation/Gait assistance: Min assist Gait Distance  (Feet): 100 Feet Assistive device: Rolling walker (2 wheels) Gait Pattern/deviations: Step-through pattern;Decreased stride length;Decreased weight shift to left       General Gait Details: pt with increased right lateral lean and weight shifted right; only able to correct a little with cues; assist for stability; pt denies dizziness   Stairs             Wheelchair Mobility    Modified Rankin (Stroke Patients Only)       Balance Overall balance assessment: Needs assistance       Postural control: Right lateral lean Standing balance support: Bilateral upper extremity supported;Reliant on assistive device for balance Standing balance-Leahy Scale: Poor                              Cognition Arousal/Alertness: Awake/alert Behavior During Therapy: WFL for tasks assessed/performed Overall Cognitive Status: Within Functional Limits for tasks assessed                                          Exercises      General Comments        Pertinent Vitals/Pain Pain Assessment: No/denies pain    Home Living                          Prior Function            PT Goals (current goals can now be found in the care plan section) Progress towards PT goals: Progressing toward goals  Frequency    Min 2X/week      PT Plan Current plan remains appropriate    Co-evaluation              AM-PAC PT "6 Clicks" Mobility   Outcome Measure  Help needed turning from your back to your side while in a flat bed without using bedrails?: A Little Help needed moving from lying on your back to sitting on the side of a flat bed without using bedrails?: A Little Help needed moving to and from a bed to a chair (including a wheelchair)?: A Lot Help needed standing up from a chair using your arms (e.g., wheelchair or bedside chair)?: A Lot Help needed to walk in hospital room?: A Lot Help needed climbing 3-5 steps with a railing? :  Total 6 Click Score: 13    End of Session Equipment Utilized During Treatment: Gait belt Activity Tolerance: Patient tolerated treatment well Patient left: in bed;with call bell/phone within reach Nurse Communication: Mobility status PT Visit Diagnosis: Unsteadiness on feet (R26.81);Muscle weakness (generalized) (M62.81)     Time: 2878-6767 PT Time Calculation (min) (ACUTE ONLY): 19 min  Charges:  $Gait Training: 8-22 mins                    Jannette Spanner PT, DPT Acute Rehabilitation Services Pager: [phone] Office: [place] 09/19/2021, 3:54 PM

## 2021-09-19 NOTE — Telephone Encounter (Signed)
Pt called today wanting to be discharged from the hospital. Pt educated that it is the hospital provider that discharges patients, not Dr.Mohamed. pt instructed to discuss with his provider in the hospital as well as his case managers. Pt had no further questions at this time.

## 2021-09-19 NOTE — TOC Progression Note (Addendum)
Transition of Care [facility]) - Progression Note    Patient Details  Name: TY BUNTROCK MRN: [id number] Date of Birth: 30-Oct-1940  Transition of [facility] CM/SW Contact  Leeroy Cha, RN Phone Number: 09/19/2021, 8:42 AM  Clinical Narrative:    [facility]in-message left for return call pt ready to go to snf. [facility]-no bed available today maybe over the weekend, dr. Sherral Hammers made aware.  Expected Discharge Plan: Home/Self Care Barriers to Discharge: Continued Medical Work up  Expected Discharge Plan and Services Expected Discharge Plan: Home/Self Care   Discharge Planning Services: CM Consult   Living arrangements for the past 2 months: Single Family Home                                       Social Determinants of Health (SDOH) Interventions    Readmission Risk Interventions No flowsheet data found.

## 2021-09-20 DIAGNOSIS — W19XXXA Unspecified fall, initial encounter: Secondary | ICD-10-CM | POA: Diagnosis not present

## 2021-09-20 DIAGNOSIS — R531 Weakness: Secondary | ICD-10-CM | POA: Diagnosis not present

## 2021-09-20 DIAGNOSIS — N19 Unspecified kidney failure: Secondary | ICD-10-CM | POA: Diagnosis not present

## 2021-09-20 DIAGNOSIS — E860 Dehydration: Secondary | ICD-10-CM | POA: Diagnosis not present

## 2021-09-20 LAB — PHOSPHORUS: Phosphorus: 3.1 mg/dL (ref 2.5–4.6)

## 2021-09-20 LAB — CBC WITH DIFFERENTIAL/PLATELET
Abs Immature Granulocytes: 0.05 10*3/uL (ref 0.00–0.07)
Basophils Absolute: 0 10*3/uL (ref 0.0–0.1)
Basophils Relative: 0 %
Eosinophils Absolute: 0 10*3/uL (ref 0.0–0.5)
Eosinophils Relative: 1 %
HCT: 28.5 % — ABNORMAL LOW (ref 39.0–52.0)
Hemoglobin: 9.4 g/dL — ABNORMAL LOW (ref 13.0–17.0)
Immature Granulocytes: 2 %
Lymphocytes Relative: 5 %
Lymphs Abs: 0.2 10*3/uL — ABNORMAL LOW (ref 0.7–4.0)
MCH: 32.4 pg (ref 26.0–34.0)
MCHC: 33 g/dL (ref 30.0–36.0)
MCV: 98.3 fL (ref 80.0–100.0)
Monocytes Absolute: 0.3 10*3/uL (ref 0.1–1.0)
Monocytes Relative: 8 %
Neutro Abs: 2.6 10*3/uL (ref 1.7–7.7)
Neutrophils Relative %: 84 %
Platelets: 90 10*3/uL — ABNORMAL LOW (ref 150–400)
RBC: 2.9 MIL/uL — ABNORMAL LOW (ref 4.22–5.81)
RDW: 15.2 % (ref 11.5–15.5)
WBC: 3.1 10*3/uL — ABNORMAL LOW (ref 4.0–10.5)
nRBC: 0 % (ref 0.0–0.2)

## 2021-09-20 LAB — COMPREHENSIVE METABOLIC PANEL WITH GFR
ALT: 8 U/L (ref 0–44)
AST: 12 U/L — ABNORMAL LOW (ref 15–41)
Albumin: 3.3 g/dL — ABNORMAL LOW (ref 3.5–5.0)
Alkaline Phosphatase: 31 U/L — ABNORMAL LOW (ref 38–126)
Anion gap: 6 (ref 5–15)
BUN: 20 mg/dL (ref 8–23)
CO2: 21 mmol/L — ABNORMAL LOW (ref 22–32)
Calcium: 8.6 mg/dL — ABNORMAL LOW (ref 8.9–10.3)
Chloride: 113 mmol/L — ABNORMAL HIGH (ref 98–111)
Creatinine, Ser: 0.89 mg/dL (ref 0.61–1.24)
GFR, Estimated: >60 mL/min
Glucose, Bld: 123 mg/dL — ABNORMAL HIGH (ref 70–99)
Potassium: 4.1 mmol/L (ref 3.5–5.1)
Sodium: 140 mmol/L (ref 135–145)
Total Bilirubin: 0.6 mg/dL (ref 0.3–1.2)
Total Protein: 5.2 g/dL — ABNORMAL LOW (ref 6.5–8.1)

## 2021-09-20 LAB — MAGNESIUM: Magnesium: 1.9 mg/dL (ref 1.7–2.4)

## 2021-09-20 NOTE — Progress Notes (Signed)
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  Patient had reported 1 to 2 weeks of generalized weakness and absence of any acute focal weakness.  He had generalized weakness with difficulty with independent completion of his ADLs with progressive difficulty ambulation resulting in multiple falls.  Yesterday's fall he struck the floor with his left hip and had sharp left nonradiating hip discomfort.  He was able to wear weight on the left lower extremity but had no exacerbation of his left hip.  The falls were not associate with any loss of consciousness and patient admits that he did not hit his head.  He is on daily aspirin and blood thinners at home and PT was placed yesterday by his outpatient provider along with a four-wheel walker although it has not been delivered yet.  Given his recurrent weakness and recurrent falls and brought into the hospital for observation.  Patient improved and PT OT recommending home health however patient's family had significant concerns for safe returning home given that patient's recurrent falls and because he remains somewhat altered.  Psychiatry was consulted for capacity evaluation.  Patient was given IV fluid hydration with improvement in his renal function.  He had underwent work-up in the ED and an EKG as well as an MRI of the brain which showed that the enhancing lesions in the right cerebral hemisphere, right frontal lobe and right medial temporal lobe  were no longer identified and there is no evidence of any intracranial metastatic disease.  The EDP discussed with on-call oncologist who recommended admission for generalized weakness.  He was given a liter normal saline bolus and then placed on continuous normal saline at 125 mL/hr and then reduce to 75 MLS per hour and is now off of fluids  During his hospitalization he was found to have orthostatic hypotension.  He was given another normal saline bolus and placed on TED hose.  Psychiatry evaluated and patient was deemed having capacity to make his medical decisions.  His renal function is improved but his cortisol level was checked and is consistent with adrenal insufficiency so he started on hydrocortisone.  He was orthostatic on 09/18/2021 but then started on hydrocortisone and also given albumin 25 g x 1.  We will repeat orthostatic vital signs in the morning  Assessment & Plan:   Principal Problem:   Generalized weakness Active Problems:   Small cell lung cancer, right ([place])   Fall at home, initial encounter   Metabolic acidosis, normal anion gap (NAG)   Protein calorie malnutrition (HCC)   Dehydration   Acute prerenal azotemia   Hyperlipemia   Hypertension   Adrenal insufficiency (HCC)  Generalized weakness with recurrent falls and physical deconditioning -He has had 1 to 2 weeks of progressive generalized weakness and absence of any focal neurologic deficit -MRI brain performed as above with no acute ischemic infarct and showed improvement in his metastatic processes -Getting IV fluid hydration and his generalized weakness is likely multifactorial -Oncology was consulted and recommended observation in  hospital -Patient is given PT OT consults which recommended home health initially but now recommending SNF for discharge given how deconditioned the patient and because of the fall risk -Orthostatic vital signs were checked and patient was orthostatic and did drop so he was given  another normal saline bolus and placed on maintenance IV fluid hydration as below -TSH was normal -Recommending checking orthostatic vital signs every shift.  Given patient's mets to his adrenal glands he is found to have adrenal insufficiency and likely the cause of his falls -Prealbumin was normal but his albumin level has now trended downward to -Nutrition consulted for poor p.o. intake -Psychiatry evaluated and patient has capacity make some medical decisions  Recurrent falls with unwitnessed fall in the ED -Patient has had progressive generalized weakness and recurrent falls -MRI brain as above -Had a unwitnessed fall in the ED so we will obtain a stat head CT scan -He has metastatic disease with a consequence of going under chemotherapy.  Likely his falls have been in the setting of dehydration given his poor p.o. intake.  He was given IV fluid hydration and this improved his numbers a little bit but his recurrent falls are likely in the setting of adrenal metastasis with low cortisol level; remains on IV fluid hydration with normal saline at 75 MLS per hour -Check orthostatic vital signs and they were positive -Oncology will be checking on the patient -Continue with fall precautions and urinalysis was unremarkable -TSH was unremarkable -Follow-up on repeat head CT scan for fall and showed no acute intracranial abnormality -PT OT now recommending SNF and he is agreeable  Agitation mild confusion, improved and patient is at baseline -On my exam he appeared to be alert and oriented x3 however wife and daughter states that he is not as baseline -He is holding onto his urine possibly so we will check a bladder scan -Repeat head CT as above -Psychiatry has been consulted for capacity evaluation and patient does have the capacity make medical decisions -We will continue to monitor closely  [place]ong[place] metabolic acidosis -IV fluid hydration is now stopped given that his nongap metabolic acidosis  is improved -CO2 is now 21, anion gap is 6, chloride level is 113 -Currently getting normal saline at 75 mils per hour -Continuing to monitor and trend and repeat CMP in a.m. with strict I's and O's   Protein calorie malnutrition with weight loss that is unintentional -Has been on Megace and diagnosed with small cell cancer -Estimated body mass index is 21.79 kg/m as calculated from the following:   Height as of this encounter: 5' 9.5" (1.765 m).   Weight as of this encounter: 67.9 kg. -Nutrition was consulted and prealbumin was normal    Dehydration -Hydration has been stopped and will continue strict I's and O's and daily weights.  Sodium bicarbonate drip has been discontinued he is now on normal saline at 75 MLS per hour which will now stop   AKI -Improving in the setting of IV fluid hydration -Patient's BUNs/creatinine went from 36/1.41 -> 20/0.89 -Continue with IV fluid hydration as above -Avoid nephrotoxic medications, contrast dyes, hypotension and renally dose medications -Repeat CMP in the a.m.   Essential Hypertension -Continue to monitor blood pressures per protocol -Takes atenolol in outpatient setting but this was held given his borderline bradycardia -Blood pressure remains on the lower side and he had some orthostatic hypotension so we will continue to hold his home medications -Blood pressure reading was 117/66   Hyperlipidemia -Continue  with home statin  HLD -C/w Rousvastatin 10 mg po Daily    GERD -Continue PPI   Normocytic anemia, chronic -Patient's baseline hemoglobin is within 10-13 -Patient Hgb/Hct is not 9.4/28.5  -Check anemia panel in the AM  -Continue to monitor for signs and symptoms bleeding; currently no overt bleeding noted -Repeat CBC in the a.m.  Thrombocytopenia -Patient's platelet count went from 111 -> 95 -> 87 -> 85 -> 78 -> 90 -> 90 -Continue to monitor for signs and symptoms of bleeding; currently no overt bleeding noted -Repeat  CBC in a.m.  Pancytopenia -All the patient's counts are down but stable as WBC is 3.1, Hgb/Hct is 9.4/28.5, Platelet was 90 -Continue to monitor and trend and repeat CBC in a.m.  Acute Urinary Retention -Getting IVF with NS  -Foley inserted and try a trial of void in the morning   Tobacco Abuse -Smoking cessation counseling given and has been smoking greater then 30 years -Continue nicotine patch and nicotine gum  DVT prophylaxis: SCDs Code Status: FULL CODE Family Communication: Discussed with wife at bedside Disposition Plan: PT/OT recommending SNF  Status is: Inpatient   The patient will require care spanning > 2 midnights and should be moved to inpatient because: Patient will require SNF   Consultants:  Psychiatry Medical Oncology Palliative care  Procedures: MRI  Antimicrobials:  Anti-infectives (From admission, onward)    Start     Dose/Rate Route Frequency Ordered Stop   09/14/21 1000  sulfamethoxazole-trimethoprim (BACTRIM DS) 800-160 MG per tablet 1 tablet        1 tablet Oral 2 times daily 09/13/21 2107          Subjective: Seen and examined at bedside and he was on the phone and had no complaints.  Still awaiting bed at clots.  Denies any chest pain or lightheadedness or dizziness.  No other concerns or complaints this time.  Objective: Vitals:   09/19/21 2030 09/20/21 0509 09/20/21 0512 09/20/21 1538  BP: 109/74 107/71  117/66  Pulse: 80 76  79  Resp: 16 18  20   Temp: 98 F (36.7 C) 97.6 F (36.4 C)  98 F (36.7 C)  TempSrc: Oral Oral  Oral  SpO2: 94% 95%  93%  Weight:   67.9 kg   Height:        Intake/Output Summary (Last 24 hours) at 09/20/2021 2023 Last data filed at 09/20/2021 1926 Gross per 24 hour  Intake 2682.55 ml  Output 3000 ml  Net -317.45 ml    Filed Weights   09/18/21 0601 09/19/21 0500 09/20/21 0512  Weight: 66.4 kg 68.2 kg 67.9 kg   Examination: Physical Exam:  Constitutional: Thin chronically ill-appearing Caucasian  male currently no acute distress sitting the chair at bedside talking on the phone Eyes: Lids and conjunctivae normal, sclerae anicteric  ENMT: External Ears, Nose appear normal. Grossly normal hearing. Mucous membranes are moist.  Neck: Appears normal, supple, no cervical masses, normal ROM, no appreciable thyromegaly, no appreciable JVD Respiratory: Diminished to auscultation bilaterally, no wheezing, rales, rhonchi or crackles. Normal respiratory effort and patient is not tachypenic. No accessory muscle use.  Unlabored breathing Cardiovascular: RRR, no murmurs / rubs / gallops. S1 and S2 auscultated.  No appreciable extremity edema Abdomen: Soft, non-tender, non-distended. Bowel sounds positive.  GU: Deferred.  Foley catheter is in place Musculoskeletal: No clubbing / cyanosis of digits/nails. No joint deformity upper and lower extremities.  Skin: No rashes, lesions, ulcers. No induration; Warm and dry.  Neurologic: CN  2-12 grossly intact with no focal deficits. Romberg sign and cerebellar reflexes not assessed.  Psychiatric: Normal judgment and insight. Alert and oriented x 3. Normal mood and appropriate affect.    Data Reviewed: I have personally reviewed following labs and imaging studies  CBC: Recent Labs  Lab 09/16/21 1142 09/17/21 0339 09/18/21 0342 09/19/21 0346 09/20/21 0435  WBC 3.0* 3.2* 2.8* 3.4* 3.1*  NEUTROABS 2.5 2.6 2.3 2.9 2.6  HGB 9.2* 9.3* 8.6* 9.2* 9.4*  HCT 27.7* 28.1* 26.0* 28.3* 28.5*  MCV 94.9 94.9 96.3 97.9 98.3  PLT 87* 85* 78* 90* 90*    Basic Metabolic Panel: Recent Labs  Lab 09/16/21 1142 09/17/21 0339 09/18/21 0342 09/19/21 0346 09/20/21 0435  NA 136 137 138 136 140  K 3.6 3.8 3.7 4.9 4.1  CL 108 105 107 111 113*  CO2 23 26 26 22  21*  GLUCOSE 87 106* 101* 103* 123*  BUN 19 19 16 22 20   CREATININE 1.24 1.13 1.00 1.08 0.89  CALCIUM 7.7* 7.9* 8.3* 8.3* 8.6*  MG 1.9 1.9 1.7 2.0 1.9  PHOS 2.7 2.8 2.9 2.9 3.1    GFR: Estimated Creatinine  Clearance: 65.7 mL/min (by C-G formula based on SCr of 0.89 mg/dL). Liver Function Tests: Recent Labs  Lab 09/16/21 1142 09/17/21 0339 09/18/21 0342 09/19/21 0346 09/20/21 0435  AST 13* 11* 11* 10* 12*  ALT 8 8 7 8 8   ALKPHOS 37* 39 32* 36* 31*  BILITOT 0.7 0.7 0.7 0.6 0.6  PROT 4.6* 4.7* 5.0* 4.9* 5.2*  ALBUMIN 2.7* 2.7* 3.4* 3.1* 3.3*    No results for input(s): LIPASE, AMYLASE in the last 168 hours. No results for input(s): AMMONIA in the last 168 hours. Coagulation Profile: Recent Labs  Lab 09/14/21 0520  INR 1.2    Cardiac Enzymes: Recent Labs  Lab 09/14/21 0520  CKTOTAL 135    BNP (last 3 results) No results for input(s): PROBNP in the last 8760 hours. HbA1C: No results for input(s): HGBA1C in the last 72 hours. CBG: No results for input(s): GLUCAP in the last 168 hours. Lipid Profile: No results for input(s): CHOL, HDL, LDLCALC, TRIG, CHOLHDL, LDLDIRECT in the last 72 hours. Thyroid Function Tests: No results for input(s): TSH, T4TOTAL, FREET4, T3FREE, THYROIDAB in the last 72 hours.  Anemia Panel: No results for input(s): VITAMINB12, FOLATE, FERRITIN, TIBC, IRON, RETICCTPCT in the last 72 hours. Sepsis Labs: No results for input(s): PROCALCITON, LATICACIDVEN in the last 168 hours.  Recent Results (from the past 240 hour(s))  Resp Panel by RT-PCR (Flu A&B, Covid) Nasopharyngeal Swab     Status: None   Collection Time: 09/13/21  8:46 PM   Specimen: Nasopharyngeal Swab; Nasopharyngeal(NP) swabs in vial transport medium  Result Value Ref Range Status   SARS Coronavirus 2 by RT PCR NEGATIVE NEGATIVE Final    Comment: (NOTE) SARS-CoV-2 target nucleic acids are NOT DETECTED.  The SARS-CoV-2 RNA is generally detectable in upper respiratory specimens during the acute phase of infection. The lowest concentration of SARS-CoV-2 viral copies this assay can detect is 138 copies/mL. A negative result does not preclude SARS-Cov-2 infection and should not be used  as the sole basis for treatment or other patient management decisions. A negative result may occur with  improper specimen collection/handling, submission of specimen other than nasopharyngeal swab, presence of viral mutation(s) within the areas targeted by this assay, and inadequate number of viral copies(<138 copies/mL). A negative result must be combined with clinical observations, patient history, and epidemiological information.  The expected result is Negative.  Fact Sheet for Patients:  EntrepreneurPulse.com.au  Fact Sheet for Healthcare Providers:  IncredibleEmployment.be  This test is no t yet approved or cleared by the [place] FDA and  has been authorized for detection and/or diagnosis of SARS-CoV-2 by FDA under an Emergency Use Authorization (EUA). This EUA will remain  in effect (meaning this test can be used) for the duration of the COVID-19 declaration under Section 564(b)(1) of the Act, 21 U.S.C.section 360bbb-3(b)(1), unless the authorization is terminated  or revoked sooner.       Influenza A by PCR NEGATIVE NEGATIVE Final   Influenza B by PCR NEGATIVE NEGATIVE Final    Comment: (NOTE) The Xpert Xpress SARS-CoV-2/FLU/RSV plus assay is intended as an aid in the diagnosis of influenza from Nasopharyngeal swab specimens and should not be used as a sole basis for treatment. Nasal washings and aspirates are unacceptable for Xpert Xpress SARS-CoV-2/FLU/RSV testing.  Fact Sheet for Patients: EntrepreneurPulse.com.au  Fact Sheet for Healthcare Providers: IncredibleEmployment.be  This test is not yet approved or cleared by the [place] FDA and has been authorized for detection and/or diagnosis of SARS-CoV-2 by FDA under an Emergency Use Authorization (EUA). This EUA will remain in effect (meaning this test can be used) for the duration of the COVID-19 declaration under Section 564(b)(1)  of the Act, 21 U.S.C. section 360bbb-3(b)(1), unless the authorization is terminated or revoked.  Performed at [facility], [place] [address] [place], [place] [address]      RN Pressure Injury Documentation:     Estimated body mass index is 21.79 kg/m as calculated from the following:   Height as of this encounter: 5' 9.5" (1.765 m).   Weight as of this encounter: 67.9 kg.  Malnutrition Type: Nutrition Problem: Increased nutrient needs Etiology: acute illness, cancer and cancer related treatments, chronic illness Malnutrition Characteristics: Signs/Symptoms: estimated needs Nutrition Interventions: Interventions: Ensure Enlive (each supplement provides 350kcal and 20 grams of protein), Boost Breeze  Radiology Studies: No results found.  Scheduled Meds:  Chlorhexidine Gluconate Cloth  6 each Topical Daily   feeding supplement  1 Container Oral Q24H   feeding supplement  237 mL Oral Q24H   hydrocortisone  5 mg Oral Daily   megestrol  400 mg Oral BID   nicotine  21 mg Transdermal Daily   pantoprazole  40 mg Oral Daily   rosuvastatin  10 mg Oral QPM   sulfamethoxazole-trimethoprim  1 tablet Oral BID   Continuous Infusions:  sodium chloride 75 mL/hr at 09/20/21 1805    LOS: 5 days   Kerney Elbe, DO [facility] PAGER is on AMION  If 7PM-7AM, please contact night-coverage [email or web address]

## 2021-09-20 NOTE — TOC Progression Note (Signed)
Transition of Care [facility]) - Progression Note    Patient Details  Name: Gregory Hodges MRN: [id number] Date of Birth: [birth year]  Transition of Care [facility]) CM/SW Contact  Ross Ludwig, [place] Phone Number: 09/20/2021, 10:09 AM  Clinical Narrative:     CSW spoke to [place] at [facility], she confirmed there are no beds available for this patient this weekend.  To check back on Monday.   Expected Discharge Plan: Home/Self Care Barriers to Discharge: Continued Medical Work up  Expected Discharge Plan and Services Expected Discharge Plan: Home/Self Care   Discharge Planning Services: CM Consult   Living arrangements for the past 2 months: Single Family Home                                       Social Determinants of Health (SDOH) Interventions    Readmission Risk Interventions No flowsheet data found.

## 2021-09-20 NOTE — Plan of Care (Signed)
  Problem: Education: Goal: Knowledge of General Education information will improve Description: Including pain rating scale, medication(s)/side effects and non-pharmacologic comfort measures Outcome: Progressing   Problem: Clinical Measurements: Goal: Diagnostic test results will improve Outcome: Progressing   Problem: Activity: Goal: Risk for activity intolerance will decrease Outcome: Progressing   

## 2021-09-21 DIAGNOSIS — N19 Unspecified kidney failure: Secondary | ICD-10-CM | POA: Diagnosis not present

## 2021-09-21 DIAGNOSIS — R531 Weakness: Secondary | ICD-10-CM | POA: Diagnosis not present

## 2021-09-21 DIAGNOSIS — E860 Dehydration: Secondary | ICD-10-CM | POA: Diagnosis not present

## 2021-09-21 DIAGNOSIS — W19XXXA Unspecified fall, initial encounter: Secondary | ICD-10-CM | POA: Diagnosis not present

## 2021-09-21 LAB — COMPREHENSIVE METABOLIC PANEL
ALT: 11 U/L (ref 0–44)
AST: 9 U/L — ABNORMAL LOW (ref 15–41)
Albumin: 3 g/dL — ABNORMAL LOW (ref 3.5–5.0)
Alkaline Phosphatase: 34 U/L — ABNORMAL LOW (ref 38–126)
Anion gap: 3 — ABNORMAL LOW (ref 5–15)
BUN: 22 mg/dL (ref 8–23)
CO2: 21 mmol/L — ABNORMAL LOW (ref 22–32)
Calcium: 8.6 mg/dL — ABNORMAL LOW (ref 8.9–10.3)
Chloride: 114 mmol/L — ABNORMAL HIGH (ref 98–111)
Creatinine, Ser: 0.93 mg/dL (ref 0.61–1.24)
GFR, Estimated: >60 mL/min (ref >60)
Glucose, Bld: 97 mg/dL (ref 70–99)
Potassium: 4.1 mmol/L (ref 3.5–5.1)
Sodium: 138 mmol/L (ref 135–145)
Total Bilirubin: 0.6 mg/dL (ref 0.3–1.2)
Total Protein: 4.9 g/dL — ABNORMAL LOW (ref 6.5–8.1)

## 2021-09-21 LAB — CBC WITH DIFFERENTIAL/PLATELET
Abs Immature Granulocytes: 0.03 10*3/uL (ref 0.00–0.07)
Basophils Absolute: 0 10*3/uL (ref 0.0–0.1)
Basophils Relative: 0 %
Eosinophils Absolute: 0 10*3/uL (ref 0.0–0.5)
Eosinophils Relative: 1 %
HCT: 29.6 % — ABNORMAL LOW (ref 39.0–52.0)
Hemoglobin: 9.5 g/dL — ABNORMAL LOW (ref 13.0–17.0)
Immature Granulocytes: 1 %
Lymphocytes Relative: 4 %
Lymphs Abs: 0.1 10*3/uL — ABNORMAL LOW (ref 0.7–4.0)
MCH: 31.9 pg (ref 26.0–34.0)
MCHC: 32.1 g/dL (ref 30.0–36.0)
MCV: 99.3 fL (ref 80.0–100.0)
Monocytes Absolute: 0.3 10*3/uL (ref 0.1–1.0)
Monocytes Relative: 8 %
Neutro Abs: 3.1 10*3/uL (ref 1.7–7.7)
Neutrophils Relative %: 86 %
Platelets: 86 10*3/uL — ABNORMAL LOW (ref 150–400)
RBC: 2.98 MIL/uL — ABNORMAL LOW (ref 4.22–5.81)
RDW: 15.3 % (ref 11.5–15.5)
WBC: 3.6 10*3/uL — ABNORMAL LOW (ref 4.0–10.5)
nRBC: 0 % (ref 0.0–0.2)

## 2021-09-21 LAB — RETICULOCYTES
Immature Retic Fract: 28.5 % — ABNORMAL HIGH (ref 2.3–15.9)
RBC.: 2.99 MIL/uL — ABNORMAL LOW (ref 4.22–5.81)
Retic Count, Absolute: 88.2 10*3/uL (ref 19.0–186.0)
Retic Ct Pct: 3 % (ref 0.4–3.1)

## 2021-09-21 LAB — VITAMIN B12: Vitamin B-12: 95 pg/mL — ABNORMAL LOW (ref 180–914)

## 2021-09-21 LAB — IRON AND TIBC
Iron: 64 ug/dL (ref 45–182)
Saturation Ratios: 38 % (ref 17.9–39.5)
TIBC: 170 ug/dL — ABNORMAL LOW (ref 250–450)
UIBC: 106 ug/dL

## 2021-09-21 LAB — FOLATE: Folate: 5.5 ng/mL — ABNORMAL LOW (ref >5.9)

## 2021-09-21 LAB — PHOSPHORUS: Phosphorus: 2.9 mg/dL (ref 2.5–4.6)

## 2021-09-21 LAB — MAGNESIUM: Magnesium: 1.8 mg/dL (ref 1.7–2.4)

## 2021-09-21 LAB — FERRITIN: Ferritin: 158 ng/mL (ref 24–336)

## 2021-09-21 MED ORDER — CYANOCOBALAMIN 1000 MCG/ML IJ SOLN
1000.0000 ug | Freq: Once | INTRAMUSCULAR | Status: AC
  Administered 2021-09-21: 13:00:00 1000 ug via INTRAMUSCULAR
  Filled 2021-09-21: qty 1

## 2021-09-21 MED ORDER — FOLIC ACID 1 MG PO TABS
1.0000 mg | ORAL_TABLET | Freq: Every day | ORAL | Status: DC
  Administered 2021-09-21 – 2021-09-22 (×2): 1 mg via ORAL
  Filled 2021-09-21 (×2): qty 1

## 2021-09-21 MED ORDER — SODIUM CHLORIDE 0.9 % IV BOLUS
1000.0000 mL | Freq: Once | INTRAVENOUS | Status: AC
  Administered 2021-09-21: 11:00:00 1000 mL via INTRAVENOUS

## 2021-09-21 NOTE — Progress Notes (Signed)
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  Patient had reported 1 to 2 weeks of generalized weakness and absence of any acute focal weakness.  He had generalized weakness with difficulty with independent completion of his ADLs with progressive difficulty ambulation resulting in multiple falls.  Yesterday's fall he struck the floor with his left hip and had sharp left nonradiating hip discomfort.  He was able to wear weight on the left lower extremity but had no exacerbation of his left hip.  The falls were not associate with any loss of consciousness and patient admits that he did not hit his head.  He is on daily aspirin and blood thinners at home and PT was placed yesterday by his outpatient provider along with a four-wheel walker although it has not been delivered yet.  Given his recurrent weakness and recurrent falls and brought into the hospital for observation.  Patient improved and PT OT recommending home health however patient's family had significant concerns for safe returning home given that patient's recurrent falls and because he remains somewhat altered.  Psychiatry was consulted for capacity evaluation.  Patient was given IV fluid hydration with improvement in his renal function.  He had underwent work-up in the ED and an EKG as well as an MRI of the brain which showed that the enhancing lesions in the right cerebral hemisphere, right frontal lobe and right medial temporal lobe  were no longer identified and there is no evidence of any intracranial metastatic disease.  The EDP discussed with on-call oncologist who recommended admission for generalized weakness.  He was given a liter normal saline bolus and then placed on continuous normal saline at 125 mL/hr and then reduce to 75 MLS per hour and is now off of fluids  During his hospitalization he was found to have orthostatic hypotension.  He was given another normal saline bolus and placed on TED hose.  Psychiatry evaluated and patient was deemed having capacity to make his medical decisions.  His renal function is improved but his cortisol level was checked and is consistent with adrenal insufficiency so he started on hydrocortisone.  He was orthostatic on 09/18/2021 but then started on hydrocortisone and also given albumin 25 g x 1.  Repeat orthostatic vital signs again still show that his orthostatic  Assessment & Plan:   Principal Problem:   Generalized weakness Active Problems:   Small cell lung cancer, right ([place])   Fall at home, initial encounter   Metabolic acidosis, normal anion gap (NAG)   Protein calorie malnutrition (HCC)   Dehydration   Acute prerenal azotemia   Hyperlipemia   Hypertension   Adrenal insufficiency (HCC)  Generalized weakness with recurrent falls and physical deconditioning likely in the setting of recurrent orthostatic hypotension from adrenal insufficiency -He has had 1 to 2 weeks of progressive generalized weakness and absence of any focal neurologic deficit -MRI brain performed as above with no acute ischemic infarct and showed improvement in his metastatic processes -Getting IV fluid hydration and his  generalized weakness is likely multifactorial -Oncology was consulted and recommended observation in hospital -Patient is given PT OT consults which recommended home health initially but now recommending SNF for discharge given how deconditioned the patient and because of the fall  risk -Orthostatic vital signs were checked and the patient again was orthostatic and did drop so was given another normal saline bolus of 1 L and then placed on thigh-high TED hose and then order for an abdominal binder -TSH was normal -Recommending checking orthostatic vital signs every shift.  Given patient's mets to his adrenal glands he is found to have adrenal insufficiency and likely the cause of his falls -Prealbumin was normal but his albumin level has now trended downward to -Nutrition consulted for poor p.o. intake -Psychiatry evaluated and patient has capacity make some medical decisions  Recurrent falls with unwitnessed fall in the ED -Patient has had progressive generalized weakness and recurrent falls -MRI brain as above -Had a unwitnessed fall in the ED so we will obtain a stat head CT scan -He has metastatic disease with a consequence of going under chemotherapy.  Likely his falls have been in the setting of dehydration given his poor p.o. intake.  He was given IV fluid hydration and this improved his numbers a little bit but his recurrent falls are likely in the setting of adrenal metastasis with low cortisol level; remains on IV fluid hydration with normal saline at 75 MLS per hour but this is now stopped.  We will 1 L normal saline bolus -Check orthostatic vital signs and they were positive -Oncology will be checking on the patient -Continue with fall precautions and urinalysis was unremarkable -TSH was unremarkable -Follow-up on repeat head CT scan for fall and showed no acute intracranial abnormality -PT OT now recommending SNF and he is agreeable  Agitation mild confusion, improved and patient is at baseline -On my exam he appeared to be alert and oriented x3 however wife and daughter states that he is not as baseline -He is holding onto his urine possibly so we will check a bladder scan -Repeat head CT as above -Psychiatry has been consulted for capacity evaluation and  patient does have the capacity make medical decisions -We will continue to monitor closely  [place]ong[place] metabolic acidosis -IV fluid hydration is now stopped given that his nongap metabolic acidosis is improved -CO2 is now 21, anion gap is 3, chloride level is 114 -Currently getting normal saline at 75 mils per hour -Continuing to monitor and trend and repeat CMP in a.m. with strict I's and O's   Protein calorie malnutrition with weight loss that is unintentional -Has been on Megace and diagnosed with small cell cancer -Estimated body mass index is 21.82 kg/m as calculated from the following:   Height as of this encounter: 5' 9.5" (1.765 m).   Weight as of this encounter: 68 kg. -Nutrition was consulted and prealbumin was normal    Dehydration -Hydration has been stopped and will continue strict I's and O's and daily weights.  Sodium bicarbonate drip has been discontinued and he was transitioned to normal saline but we have also discontinued his normal saline -We will give a liter bolus given his orthostatic hypotension   AKI -Improving in the setting of IV fluid hydration -Patient's BUNs/creatinine went from 36/1.41 -> 20/0.89 and today was 22/0.93 -Continue with IV fluid hydration as above -Avoid nephrotoxic medications, contrast dyes, hypotension and renally dose medications -Repeat CMP in the a.m.   Essential Hypertension -Continue to monitor  blood pressures per protocol -Takes atenolol in outpatient setting but this was held given his borderline bradycardia -Blood pressure remains on the lower side and he had some orthostatic hypotension so we will continue to hold his home medications -Blood pressure reading was 130/71   Hyperlipidemia -Continue with home statin  HLD -C/w Rousvastatin 10 mg po Daily    GERD -Continue PPI   Normocytic anemia, chronic -Patient's baseline hemoglobin is within 10-13 -Patient Hgb/Hct is stable at 9.5/29.6 -Checked anemia panel showed an iron  level of 64, U IBC of 106, TIBC 170, saturation ratio 38%, ferritin level 158, folate of 5.5, vitamin B12 level of 95 -We will start vitamin B12 supplementation as well as folate supplementation; will give 1000 mcg of vitamin B12 IM and then 1 mg of folic acid daily -Continue to monitor for signs and symptoms bleeding; currently no overt bleeding noted -Repeat CBC in the a.m.  Thrombocytopenia -Patient's platelet count went from 111 -> 95 -> 87 -> 85 -> 78 -> 90 -> 90 -> 86 -Continue to monitor for signs and symptoms of bleeding; currently no overt bleeding noted -Repeat CBC in a.m.  Pancytopenia -All the patient's counts are down but stable as WBC is 3.6, Hgb/Hct is 9.5/29.6, Platelet is now 86 -Continue to monitor and trend and repeat CBC in a.m.  Acute Urinary Retention -IV fluid resuscitation now stopped but will give a 1 L bolus given his orthostatic hypotension -Foley inserted and try a trial of void in the morning -Patient is -5.122 L since admission   Tobacco Abuse -Smoking cessation counseling given and has been smoking greater then 30 years -Continue nicotine patch and nicotine gum  DVT prophylaxis: SCDs Code Status: FULL CODE Family Communication: Discussed with wife at bedside Disposition Plan: PT/OT recommending SNF and anticipating discharging in the next 24 to 48 hours  Status is: Inpatient   The patient will require care spanning > 2 midnights and should be moved to inpatient because: Patient will require SNF   Consultants:  Psychiatry Medical Oncology Palliative care  Procedures: MRI  Antimicrobials:  Anti-infectives (From admission, onward)    Start     Dose/Rate Route Frequency Ordered Stop   09/14/21 1000  sulfamethoxazole-trimethoprim (BACTRIM DS) 800-160 MG per tablet 1 tablet        1 tablet Oral 2 times daily 09/13/21 2107          Subjective: Seen and examined at bedside he was little agitated today.  Wanted to go home and see if he can go  back to SNF in the morning.  No chest pain or lightheadedness or dizziness.  Was orthostatic again today so we will give him another normal saline bolus and ordered thigh-high TED hose and an abdominal binder.  No other concerns or complaints at this time.  Objective: Vitals:   09/20/21 0512 09/20/21 1538 09/20/21 2149 09/21/21 0506  BP:  117/66 109/71 130/71  Pulse:  79 84 75  Resp:  20 18 18   Temp:  98 F (36.7 C) 98.3 F (36.8 C) (!) 97.4 F (36.3 C)  TempSrc:  Oral Oral Oral  SpO2:  93% 95% 96%  Weight: 67.9 kg   68 kg  Height:        Intake/Output Summary (Last 24 hours) at 09/21/2021 1250 Last data filed at 09/21/2021 1038 Gross per 24 hour  Intake 2544.77 ml  Output 4500 ml  Net -1955.23 ml    Filed Weights   09/19/21 0500 09/20/21 0512 09/21/21  5631  Weight: 68.2 kg 67.9 kg 68 kg   Examination: Physical Exam:  Constitutional: Thin chronically ill-appearing Caucasian male currently no acute distress appears little agitated lying in the bed Eyes: Lids and conjunctivae normal, sclerae anicteric  ENMT: External Ears, Nose appear normal. Grossly normal hearing. Mucous membranes are moist.  Neck: Appears normal, supple, no cervical masses, normal ROM, no appreciable thyromegaly; no appreciable JVD Respiratory: Diminished to auscultation bilaterally, no wheezing, rales, rhonchi or crackles. Normal respiratory effort and patient is not tachypenic. No accessory muscle use.  Unlabored breathing Cardiovascular: RRR, no murmurs / rubs / gallops. S1 and S2 auscultated.  No appreciable extremity edema Abdomen: Soft, non-tender, non-distended. Bowel sounds positive.  GU: Deferred.  Foley catheter is in place Musculoskeletal: No clubbing / cyanosis of digits/nails. No joint deformity upper and lower extremities.  Skin: No rashes, lesions, ulcers on limited skin evaluation. No induration; Warm and dry.  Neurologic: CN 2-12 grossly intact with no focal deficits. Romberg sign and  cerebellar reflexes not assessed.  Psychiatric: Normal judgment and insight. Alert and oriented x 3.  A little agitated mood and appropriate affect.   Data Reviewed: I have personally reviewed following labs and imaging studies  CBC: Recent Labs  Lab 09/17/21 0339 09/18/21 0342 09/19/21 0346 09/20/21 0435 09/21/21 0340  WBC 3.2* 2.8* 3.4* 3.1* 3.6*  NEUTROABS 2.6 2.3 2.9 2.6 3.1  HGB 9.3* 8.6* 9.2* 9.4* 9.5*  HCT 28.1* 26.0* 28.3* 28.5* 29.6*  MCV 94.9 96.3 97.9 98.3 99.3  PLT 85* 78* 90* 90* 86*    Basic Metabolic Panel: Recent Labs  Lab 09/17/21 0339 09/18/21 0342 09/19/21 0346 09/20/21 0435 09/21/21 0340  NA 137 138 136 140 138  K 3.8 3.7 4.9 4.1 4.1  CL 105 107 111 113* 114*  CO2 26 26 22  21* 21*  GLUCOSE 106* 101* 103* 123* 97  BUN 19 16 22 20 22   CREATININE 1.13 1.00 1.08 0.89 0.93  CALCIUM 7.9* 8.3* 8.3* 8.6* 8.6*  MG 1.9 1.7 2.0 1.9 1.8  PHOS 2.8 2.9 2.9 3.1 2.9    GFR: Estimated Creatinine Clearance: 63 mL/min (by C-G formula based on SCr of 0.93 mg/dL). Liver Function Tests: Recent Labs  Lab 09/17/21 0339 09/18/21 0342 09/19/21 0346 09/20/21 0435 09/21/21 0340  AST 11* 11* 10* 12* 9*  ALT 8 7 8 8 11   ALKPHOS 39 32* 36* 31* 34*  BILITOT 0.7 0.7 0.6 0.6 0.6  PROT 4.7* 5.0* 4.9* 5.2* 4.9*  ALBUMIN 2.7* 3.4* 3.1* 3.3* 3.0*    No results for input(s): LIPASE, AMYLASE in the last 168 hours. No results for input(s): AMMONIA in the last 168 hours. Coagulation Profile: No results for input(s): INR, PROTIME in the last 168 hours.  Cardiac Enzymes: No results for input(s): CKTOTAL, CKMB, CKMBINDEX, TROPONINI in the last 168 hours.  BNP (last 3 results) No results for input(s): PROBNP in the last 8760 hours. HbA1C: No results for input(s): HGBA1C in the last 72 hours. CBG: No results for input(s): GLUCAP in the last 168 hours. Lipid Profile: No results for input(s): CHOL, HDL, LDLCALC, TRIG, CHOLHDL, LDLDIRECT in the last 72 hours. Thyroid  Function Tests: No results for input(s): TSH, T4TOTAL, FREET4, T3FREE, THYROIDAB in the last 72 hours.  Anemia Panel: Recent Labs    09/21/21 0340  VITAMINB12 95*  FOLATE 5.5*  FERRITIN 158  TIBC 170*  IRON 64  RETICCTPCT 3.0   Sepsis Labs: No results for input(s): PROCALCITON, LATICACIDVEN in the last 168 hours.  Recent Results (from the past 240 hour(s))  Resp Panel by RT-PCR (Flu A&B, Covid) Nasopharyngeal Swab     Status: None   Collection Time: 09/13/21  8:46 PM   Specimen: Nasopharyngeal Swab; Nasopharyngeal(NP) swabs in vial transport medium  Result Value Ref Range Status   SARS Coronavirus 2 by RT PCR NEGATIVE NEGATIVE Final    Comment: (NOTE) SARS-CoV-2 target nucleic acids are NOT DETECTED.  The SARS-CoV-2 RNA is generally detectable in upper respiratory specimens during the acute phase of infection. The lowest concentration of SARS-CoV-2 viral copies this assay can detect is 138 copies/mL. A negative result does not preclude SARS-Cov-2 infection and should not be used as the sole basis for treatment or other patient management decisions. A negative result may occur with  improper specimen collection/handling, submission of specimen other than nasopharyngeal swab, presence of viral mutation(s) within the areas targeted by this assay, and inadequate number of viral copies(<138 copies/mL). A negative result must be combined with clinical observations, patient history, and epidemiological information. The expected result is Negative.  Fact Sheet for Patients:  EntrepreneurPulse.com.au  Fact Sheet for Healthcare Providers:  IncredibleEmployment.be  This test is no t yet approved or cleared by the [place] FDA and  has been authorized for detection and/or diagnosis of SARS-CoV-2 by FDA under an Emergency Use Authorization (EUA). This EUA will remain  in effect (meaning this test can be used) for the duration of the COVID-19  declaration under Section 564(b)(1) of the Act, 21 U.S.C.section 360bbb-3(b)(1), unless the authorization is terminated  or revoked sooner.       Influenza A by PCR NEGATIVE NEGATIVE Final   Influenza B by PCR NEGATIVE NEGATIVE Final    Comment: (NOTE) The Xpert Xpress SARS-CoV-2/FLU/RSV plus assay is intended as an aid in the diagnosis of influenza from Nasopharyngeal swab specimens and should not be used as a sole basis for treatment. Nasal washings and aspirates are unacceptable for Xpert Xpress SARS-CoV-2/FLU/RSV testing.  Fact Sheet for Patients: EntrepreneurPulse.com.au  Fact Sheet for Healthcare Providers: IncredibleEmployment.be  This test is not yet approved or cleared by the [place] FDA and has been authorized for detection and/or diagnosis of SARS-CoV-2 by FDA under an Emergency Use Authorization (EUA). This EUA will remain in effect (meaning this test can be used) for the duration of the COVID-19 declaration under Section 564(b)(1) of the Act, 21 U.S.C. section 360bbb-3(b)(1), unless the authorization is terminated or revoked.  Performed at [facility], [place] [address] [place], [place] [address]      RN Pressure Injury Documentation:     Estimated body mass index is 21.82 kg/m as calculated from the following:   Height as of this encounter: 5' 9.5" (1.765 m).   Weight as of this encounter: 68 kg.  Malnutrition Type: Nutrition Problem: Increased nutrient needs Etiology: acute illness, cancer and cancer related treatments, chronic illness Malnutrition Characteristics: Signs/Symptoms: estimated needs Nutrition Interventions: Interventions: Ensure Enlive (each supplement provides 350kcal and 20 grams of protein), Boost Breeze  Radiology Studies: No results found.  Scheduled Meds:  Chlorhexidine Gluconate Cloth  6 each Topical Daily   feeding supplement  1 Container Oral Q24H   feeding  supplement  237 mL Oral Q24H   hydrocortisone  5 mg Oral Daily   megestrol  400 mg Oral BID   nicotine  21 mg Transdermal Daily   pantoprazole  40 mg Oral Daily   rosuvastatin  10 mg Oral QPM   sulfamethoxazole-trimethoprim  1 tablet Oral  BID   Continuous Infusions:  sodium chloride      LOS: 6 days   Kerney Elbe, DO [facility] PAGER is on [place]  If 7PM-7AM, please contact night-coverage [email or web address]

## 2021-09-22 DIAGNOSIS — E2740 Unspecified adrenocortical insufficiency: Secondary | ICD-10-CM | POA: Diagnosis not present

## 2021-09-22 DIAGNOSIS — I951 Orthostatic hypotension: Secondary | ICD-10-CM | POA: Diagnosis not present

## 2021-09-22 DIAGNOSIS — F4322 Adjustment disorder with anxiety: Secondary | ICD-10-CM | POA: Diagnosis not present

## 2021-09-22 DIAGNOSIS — R079 Chest pain, unspecified: Secondary | ICD-10-CM | POA: Diagnosis not present

## 2021-09-22 DIAGNOSIS — Z7401 Bed confinement status: Secondary | ICD-10-CM | POA: Diagnosis not present

## 2021-09-22 DIAGNOSIS — Z7901 Long term (current) use of anticoagulants: Secondary | ICD-10-CM | POA: Diagnosis not present

## 2021-09-22 DIAGNOSIS — F17200 Nicotine dependence, unspecified, uncomplicated: Secondary | ICD-10-CM | POA: Diagnosis not present

## 2021-09-22 DIAGNOSIS — N19 Unspecified kidney failure: Secondary | ICD-10-CM | POA: Diagnosis not present

## 2021-09-22 DIAGNOSIS — E46 Unspecified protein-calorie malnutrition: Secondary | ICD-10-CM | POA: Diagnosis not present

## 2021-09-22 DIAGNOSIS — M2550 Pain in unspecified joint: Secondary | ICD-10-CM | POA: Diagnosis not present

## 2021-09-22 DIAGNOSIS — E860 Dehydration: Secondary | ICD-10-CM | POA: Diagnosis not present

## 2021-09-22 DIAGNOSIS — C3490 Malignant neoplasm of unspecified part of unspecified bronchus or lung: Secondary | ICD-10-CM | POA: Diagnosis not present

## 2021-09-22 DIAGNOSIS — R52 Pain, unspecified: Secondary | ICD-10-CM | POA: Diagnosis not present

## 2021-09-22 DIAGNOSIS — E872 Acidosis, unspecified: Secondary | ICD-10-CM | POA: Diagnosis not present

## 2021-09-22 DIAGNOSIS — R338 Other retention of urine: Secondary | ICD-10-CM | POA: Diagnosis not present

## 2021-09-22 DIAGNOSIS — E7800 Pure hypercholesterolemia, unspecified: Secondary | ICD-10-CM

## 2021-09-22 DIAGNOSIS — R110 Nausea: Secondary | ICD-10-CM | POA: Diagnosis not present

## 2021-09-22 DIAGNOSIS — A000 Cholera due to Vibrio cholerae 01, biovar cholerae: Secondary | ICD-10-CM | POA: Diagnosis not present

## 2021-09-22 DIAGNOSIS — R7989 Other specified abnormal findings of blood chemistry: Secondary | ICD-10-CM | POA: Diagnosis not present

## 2021-09-22 DIAGNOSIS — W19XXXA Unspecified fall, initial encounter: Secondary | ICD-10-CM | POA: Diagnosis not present

## 2021-09-22 DIAGNOSIS — S31000A Unspecified open wound of lower back and pelvis without penetration into retroperitoneum, initial encounter: Secondary | ICD-10-CM | POA: Diagnosis not present

## 2021-09-22 DIAGNOSIS — E612 Magnesium deficiency: Secondary | ICD-10-CM | POA: Diagnosis not present

## 2021-09-22 DIAGNOSIS — K219 Gastro-esophageal reflux disease without esophagitis: Secondary | ICD-10-CM | POA: Diagnosis not present

## 2021-09-22 DIAGNOSIS — R296 Repeated falls: Secondary | ICD-10-CM | POA: Diagnosis not present

## 2021-09-22 DIAGNOSIS — R531 Weakness: Secondary | ICD-10-CM | POA: Diagnosis not present

## 2021-09-22 DIAGNOSIS — C3491 Malignant neoplasm of unspecified part of right bronchus or lung: Secondary | ICD-10-CM | POA: Diagnosis not present

## 2021-09-22 DIAGNOSIS — I10 Essential (primary) hypertension: Secondary | ICD-10-CM | POA: Diagnosis not present

## 2021-09-22 DIAGNOSIS — E785 Hyperlipidemia, unspecified: Secondary | ICD-10-CM | POA: Diagnosis not present

## 2021-09-22 DIAGNOSIS — C7931 Secondary malignant neoplasm of brain: Secondary | ICD-10-CM | POA: Diagnosis not present

## 2021-09-22 DIAGNOSIS — Z79899 Other long term (current) drug therapy: Secondary | ICD-10-CM | POA: Diagnosis not present

## 2021-09-22 LAB — CBC WITH DIFFERENTIAL/PLATELET
Abs Immature Granulocytes: 0.03 10*3/uL (ref 0.00–0.07)
Basophils Absolute: 0 10*3/uL (ref 0.0–0.1)
Basophils Relative: 0 %
Eosinophils Absolute: 0 10*3/uL (ref 0.0–0.5)
Eosinophils Relative: 1 %
HCT: 29.5 % — ABNORMAL LOW (ref 39.0–52.0)
Hemoglobin: 9.4 g/dL — ABNORMAL LOW (ref 13.0–17.0)
Immature Granulocytes: 1 %
Lymphocytes Relative: 5 %
Lymphs Abs: 0.2 10*3/uL — ABNORMAL LOW (ref 0.7–4.0)
MCH: 32 pg (ref 26.0–34.0)
MCHC: 31.9 g/dL (ref 30.0–36.0)
MCV: 100.3 fL — ABNORMAL HIGH (ref 80.0–100.0)
Monocytes Absolute: 0.3 10*3/uL (ref 0.1–1.0)
Monocytes Relative: 9 %
Neutro Abs: 2.8 10*3/uL (ref 1.7–7.7)
Neutrophils Relative %: 84 %
Platelets: 89 10*3/uL — ABNORMAL LOW (ref 150–400)
RBC: 2.94 MIL/uL — ABNORMAL LOW (ref 4.22–5.81)
RDW: 15.9 % — ABNORMAL HIGH (ref 11.5–15.5)
WBC: 3.4 10*3/uL — ABNORMAL LOW (ref 4.0–10.5)
nRBC: 0 % (ref 0.0–0.2)

## 2021-09-22 LAB — COMPREHENSIVE METABOLIC PANEL
ALT: 10 U/L (ref 0–44)
AST: 10 U/L — ABNORMAL LOW (ref 15–41)
Albumin: 3.1 g/dL — ABNORMAL LOW (ref 3.5–5.0)
Alkaline Phosphatase: 34 U/L — ABNORMAL LOW (ref 38–126)
Anion gap: 4 — ABNORMAL LOW (ref 5–15)
BUN: 19 mg/dL (ref 8–23)
CO2: 20 mmol/L — ABNORMAL LOW (ref 22–32)
Calcium: 8.5 mg/dL — ABNORMAL LOW (ref 8.9–10.3)
Chloride: 114 mmol/L — ABNORMAL HIGH (ref 98–111)
Creatinine, Ser: 1.03 mg/dL (ref 0.61–1.24)
GFR, Estimated: >60 mL/min (ref >60)
Glucose, Bld: 95 mg/dL (ref 70–99)
Potassium: 4.2 mmol/L (ref 3.5–5.1)
Sodium: 138 mmol/L (ref 135–145)
Total Bilirubin: 0.7 mg/dL (ref 0.3–1.2)
Total Protein: 4.9 g/dL — ABNORMAL LOW (ref 6.5–8.1)

## 2021-09-22 LAB — RESP PANEL BY RT-PCR (FLU A&B, COVID) ARPGX2
Influenza A by PCR: NEGATIVE
Influenza B by PCR: NEGATIVE
SARS Coronavirus 2 by RT PCR: NEGATIVE

## 2021-09-22 LAB — PHOSPHORUS: Phosphorus: 3.2 mg/dL (ref 2.5–4.6)

## 2021-09-22 LAB — MAGNESIUM: Magnesium: 1.8 mg/dL (ref 1.7–2.4)

## 2021-09-22 MED ORDER — SODIUM CHLORIDE 0.9 % IV BOLUS
1000.0000 mL | Freq: Once | INTRAVENOUS | Status: AC
  Administered 2021-09-22: 13:00:00 1000 mL via INTRAVENOUS

## 2021-09-22 MED ORDER — SODIUM CHLORIDE 0.9 % IV SOLN: INTRAVENOUS | Status: DC | PRN

## 2021-09-22 MED ORDER — ENSURE ENLIVE PO LIQD: 237.0000 mL | ORAL | 12 refills | Status: DC

## 2021-09-22 MED ORDER — MAGNESIUM SULFATE 2 GM/50ML IV SOLN
2.0000 g | Freq: Once | INTRAVENOUS | Status: AC
  Administered 2021-09-22: 12:00:00 2 g via INTRAVENOUS
  Filled 2021-09-22: qty 50

## 2021-09-22 MED ORDER — NICOTINE 21 MG/24HR TD PT24: 21.0000 mg | MEDICATED_PATCH | Freq: Every day | TRANSDERMAL | 0 refills | Status: DC

## 2021-09-22 MED ORDER — HEPARIN SOD (PORK) LOCK FLUSH 100 UNIT/ML IV SOLN
500.0000 [IU] | INTRAVENOUS | Status: AC | PRN
  Administered 2021-09-22: 15:00:00 500 [IU]

## 2021-09-22 MED ORDER — SODIUM CHLORIDE 0.9 % IV BOLUS
1000.0000 mL | Freq: Once | INTRAVENOUS | Status: AC
  Administered 2021-09-22: 10:00:00 1000 mL via INTRAVENOUS

## 2021-09-22 MED ORDER — FOLIC ACID 1 MG PO TABS: 1.0000 mg | ORAL_TABLET | Freq: Every day | ORAL | 0 refills | Status: DC

## 2021-09-22 MED ORDER — HYDROCORTISONE 5 MG PO TABS: 5.0000 mg | ORAL_TABLET | Freq: Every day | ORAL | 0 refills | Status: DC

## 2021-09-22 NOTE — Progress Notes (Signed)
Inpatient Rehab Admissions Coordinator:   Per therapist request,  patient was screened for CIR candidacy by Clemens Catholic, MS, CCC-SLP. I do not believe Pt.'s insurance will approve CIR for his diagnoses so I will not pursue consult at this time. Please contact me any with questions.  Clemens Catholic, [place], [place] Admissions Coordinator  [phone] ([place]) [phone] (office)

## 2021-09-22 NOTE — Progress Notes (Signed)
Physical Therapy Treatment Patient Details Name: Gregory Hodges MRN: [id number] DOB: 20-Nov-1940 Today's Date: 09/22/2021   History of Present Illness Gregory Hodges is a 80 y.o. male  who is admitted to [facility] on 09/13/2021 with generalized weakness. 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 Pt tolerates ambulation in hallway with RW, narrow BOS, generally unsteady with ambulation without overt LOB. Pt tolerates standing exercises and STS for strengthening, requires HHA for steadying. Pt denies pain, tolerates remaining up in recliner at EOS.   Supine BP: 112/65 Sitting BP: 113/66 Standing 3 min BP: 106/60    Recommendations for follow up therapy are one component of a multi-disciplinary discharge planning process, led by the attending physician.  Recommendations may be updated based on patient status, additional functional criteria and insurance authorization.  Follow Up Recommendations  Skilled nursing-short term rehab (<3 hours/day)     Assistance Recommended at Discharge Frequent or constant Supervision/Assistance  Equipment Recommendations  None recommended by PT    Recommendations for Other Services       Precautions / Restrictions Precautions Precautions: Fall Precaution Comments: monitor BP Restrictions Weight Bearing Restrictions: No     Mobility  Bed Mobility Overal bed mobility: Needs Assistance Bed Mobility: Supine to Sit  Supine to sit: Supervision;HOB elevated  General bed mobility comments: increasead time, heavy use of bedrail to upright trunk and scoot out to EOB    Transfers Overall transfer level: Needs assistance Equipment used: Rolling walker (2 wheels) Transfers: Sit to/from Stand Sit to Stand: Min  guard  General transfer comment: VC for hand placement, initially requiring elevated bed height, progress to regaular height with use of UEs    Ambulation/Gait Ambulation/Gait assistance: Min assist Gait Distance (Feet): 100 Feet Assistive device: Rolling walker (2 wheels) Gait Pattern/deviations: Step-through pattern;Decreased stride length;Narrow base of support Gait velocity: decreased  General Gait Details: narrow BOS with mild unsteadiness, increased time with turns, VC for increased bil foot clearance due to occasional shuffling step progression, pt denies dizziness   Stairs             Wheelchair Mobility    Modified Rankin (Stroke Patients Only)       Balance Overall balance assessment: Needs assistance  Sitting balance-Leahy Scale: Poor  Standing balance support: Bilateral upper extremity supported;Reliant on assistive device for balance;During functional activity Standing balance-Leahy Scale: Poor     Cognition Arousal/Alertness: Awake/alert Behavior During Therapy: WFL for tasks assessed/performed Overall Cognitive Status: Within Functional Limits for tasks assessed     Exercises General Exercises - Lower Extremity Heel Raises: Standing;AROM;Strengthening;Both;10 reps (RW for steadying) Other Exercises Other Exercises: STS, 5 reps with BUE assisting on recliner arm rests + 5 reps with BUE across chest Other Exercises: Mini lunges forward then return to feet together, 5 reps with single UE supprot for steadying    General Comments        Pertinent Vitals/Pain Pain Assessment: No/denies pain    Home Living                          Prior Function            PT Goals (current goals can now be found in the care plan section) Acute Rehab PT  Goals Patient Stated Goal: Regain IND PT Goal Formulation: With patient Time For Goal Achievement: 09/28/21 Potential to Achieve Goals: Good Progress towards PT goals: Progressing toward goals     Frequency    Min 2X/week      PT Plan Current plan remains appropriate    Co-evaluation              AM-PAC PT "6 Clicks" Mobility   Outcome Measure  Help needed turning from your back to your side while in a flat bed without using bedrails?: A Little Help needed moving from lying on your back to sitting on the side of a flat bed without using bedrails?: A Little Help needed moving to and from a bed to a chair (including a wheelchair)?: A Little Help needed standing up from a chair using your arms (e.g., wheelchair or bedside chair)?: A Little Help needed to walk in hospital room?: A Little Help needed climbing 3-5 steps with a railing? : A Lot 6 Click Score: 17    End of Session Equipment Utilized During Treatment: Gait belt Activity Tolerance: Patient tolerated treatment well Patient left: in chair;with call bell/phone within reach;with chair alarm set Nurse Communication: Mobility status PT Visit Diagnosis: Unsteadiness on feet (R26.81);Muscle weakness (generalized) (M62.81)     Time: 2841-3244 PT Time Calculation (min) (ACUTE ONLY): 36 min  Charges:  $Gait Training: 8-22 mins $Therapeutic Exercise: 8-22 mins                      Tori Vergene Marland PT, DPT 09/22/21, 11:37 AM

## 2021-09-22 NOTE — Progress Notes (Signed)
Report called to [facility]. Tele removed. Port deaccessed by IV team. Pt dressed in personal clothing. Awaiting PTAR transport.

## 2021-09-22 NOTE — Discharge Summary (Signed)
Physician Discharge Summary  TAHSIN BENYO ZOX:[id number] DOB: November 03, 1940 DOA: 09/13/2021  PCP: [name], MD  Admit date: 09/13/2021 Discharge date: 09/22/2021  Admitted From: Home Disposition: SNF  Recommendations for Outpatient Follow-up:  Follow up with PCP in 1-2 weeks Follow up with Medical Oncology within 1-2 weeks  Please obtain CMP/CBC, Mag, Phos in one week Please follow up on the following pending results:  Home Health: No  Equipment/Devices: None    Discharge Condition: Stable  CODE STATUS: DO NOT RESUSCITATE   Diet recommendation: Regular Diet   Brief/Interim Summary: The patient is a 80 year old Caucasian male with a past medical history significant for but not limited to 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  Patient had reported 1 to 2 weeks of generalized weakness and absence of any acute focal weakness.  He had generalized weakness with difficulty with independent completion of his ADLs with progressive difficulty ambulation resulting in multiple falls.  Yesterday's fall he struck the floor with his left hip and had sharp left nonradiating hip discomfort.  He was able to wear weight on the left lower extremity but had no exacerbation of his left hip.  The falls were not associate with any loss of consciousness and patient admits that he did not hit his head.  He is on daily aspirin and blood thinners at home and PT was placed yesterday by his outpatient provider along with a four-wheel walker although it has not been delivered yet.  Given his recurrent weakness and recurrent falls and brought into the hospital for observation.  Patient improved and PT OT recommending home health  however patient's family had significant concerns for safe returning home given that patient's recurrent falls and because he remains somewhat altered.  Psychiatry was consulted for capacity evaluation.  Patient was given IV fluid hydration with improvement in his renal function.  He had underwent work-up in the ED and an EKG as well as an MRI of the brain which showed that the enhancing lesions in the right cerebral hemisphere, right frontal lobe and right medial temporal lobe were no longer identified and there is no evidence of any intracranial metastatic disease.  The EDP discussed with on-call oncologist who recommended admission for generalized weakness.  He was given a liter normal saline bolus and then placed on continuous normal saline at 125 mL/hr and then reduce to 75 MLS per hour and is now off of fluids   During his hospitalization he was found to have orthostatic hypotension.  He was given another normal saline bolus and placed on TED hose.  Psychiatry evaluated and patient was deemed having capacity to make his medical decisions.  His renal function is improved but his cortisol level was checked and is consistent with adrenal insufficiency so he started on hydrocortisone.  He was orthostatic on 09/18/2021 but then started on hydrocortisone and also given albumin 25 g x 1.  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 insufficiency -He has had 1 to 2 weeks of progressive generalized  weakness and absence of any focal neurologic deficit -MRI brain performed as above with no acute ischemic infarct and showed improvement in his metastatic processes -Getting IV fluid hydration and his generalized weakness is likely multifactorial -Oncology was consulted and recommended observation in hospital -Patient is given PT OT consults which recommended home health initially but now recommending SNF for discharge given how deconditioned the patient and because of the fall risk -Orthostatic vital signs were checked and the patient again was orthostatic yesterday and this AM and did drop so was given another normal saline bolus of 1 L and then placed on thigh-high TED hose and then order for an abdominal binder -TSH was normal -Recommending checking orthostatic vital signs every shift.  Given patient's mets to his adrenal glands he is found to have adrenal insufficiency and likely the cause of his falls and has been started on Hydrocortisone 5 mg  -Prealbumin was normal but his albumin level has now trended downward to -Nutrition consulted for poor p.o. intake -Psychiatry evaluated and patient has capacity make some medical decisions -Follow up with PCP, Medical Oncology and Endocrinology at D/C   Recurrent falls with unwitnessed fall in the ED -Patient has had progressive generalized weakness and recurrent falls -MRI brain as above -Had a unwitnessed fall in the ED so we will obtain a stat head CT scan -He has metastatic disease with a consequence of going under chemotherapy.  Likely his falls have been in the setting of dehydration given his poor p.o. intake.  He was given IV fluid hydration and this improved his numbers a little bit but his recurrent falls are likely in the setting of adrenal metastasis with low cortisol level; remainded on IV fluid hydration with normal saline at 75 MLS per hour but this is now stopped.  We will give 1 L normal saline bolus again this AM given Orthostatics  this AM  -Check orthostatic vital signs and they were positive initially; Repeat this AM showed he was orthostatic but after another bolus he was not Orthostatic as BP was as follows: Supine BP: 112/65 Sitting BP: 113/66 Standing 3 min BP: 106/60 -Oncology will be checking on the patient and will follow up in the outpatient setting  -Continue with fall precautions and urinalysis was unremarkable -TSH was unremarkable -Follow-up on repeat head CT scan for fall and showed no acute intracranial abnormality -PT OT now recommending SNF and he is agreeable   Agitation mild confusion, improved and patient is at baseline -On my exam he appeared to be alert and oriented x3 however wife and daughter states that he is not as baseline -He is holding onto his urine possibly so we will check a bladder scan -Repeat head CT as above -Psychiatry has been consulted for capacity evaluation and patient does have the capacity make medical decisions -We will continue to monitor closely   [place] metabolic acidosis -IV fluid hydration is now stopped given that his nongap metabolic acidosis is improved -CO2 is now 20, anion gap is 4, chloride level is 114 -Currently getting normal saline at 75 mils per hour -Continuing to monitor and trend and repeat CMP in a.m. with strict I's and O's   Protein calorie malnutrition with weight loss that is  unintentional -Has been on Megace and diagnosed with small cell cancer -Estimated body mass index is 21.82 kg/m as calculated from the following:   Height as of this encounter: 5' 9.5" (1.765 m).   Weight as of this encounter: 68 kg. -Nutrition was consulted and prealbumin was normal -Nutritionist consulted and recommending boost breeze once a day and Ensure Enlive once a day    Dehydration -Hydration has been stopped and will continue strict I's and O's and daily weights.  Sodium bicarbonate drip has been discontinued and he was transitioned to normal saline but we have  also discontinued his normal saline -We will give another liter bolus given his orthostatic hypotension   AKI -Improving in the setting of IV fluid hydration -Patient's BUNs/creatinine went from 36/1.41 -> 20/0.89 -> 22/0.93 -> 19/1.03 -Continue with IV fluid hydration as above and will give a bolus prior to D/C  -Avoid nephrotoxic medications, contrast dyes, hypotension and renally dose medications -Repeat CMP in the a.m.   Essential Hypertension -Continue to monitor blood pressures per protocol -Takes atenolol in outpatient setting but this was held given his borderline bradycardia -Blood pressure remains on the lower side and he had some orthostatic hypotension so we will continue to hold his home medications -Blood pressure reading was 102/61   HLD -C/w Rousvastatin 10 mg po Daily    GERD -Continue PPI   Normocytic anemia, chronic -Patient's baseline hemoglobin is within 10-13 -Patient Hgb/Hct is stable at 9.4/29.5 -Checked anemia panel showed an iron level of 64, U IBC of 106, TIBC 170, saturation ratio 38%, ferritin level 158, folate of 5.5, vitamin B12 level of 95 -We will start vitamin B12 supplementation as well as folate supplementation; will give 1000 mcg of vitamin B12 IM and then 1 mg of folic acid daily -Continue to monitor for signs and symptoms bleeding; currently no overt bleeding noted -Repeat CBC in the a.m.   Thrombocytopenia -Patient's platelet count went from 111 -> 95 -> 87 -> 85 -> 78 -> 90 -> 90 -> 86 -> 89 -Continue to monitor for signs and symptoms of bleeding; currently no overt bleeding noted -Repeat CBC in a.m.   Pancytopenia -All the patient's counts are down but stable as WBC is 3.4, Hgb/Hct is 9.4/29.5, Platelet is now 89 -Continue to monitor and trend and repeat CBC within 1 week   Acute Urinary Retention -IV fluid resuscitation now stopped but will give a 1 L bolus given his orthostatic hypotension -Foley removed now for TOV; Checking  Bladder scan after foley was discontinued  -Patient is -5.392 L since admission -If still retaining will need reinsertion of Catheter and outpatient follow up for TOV   Tobacco Abuse -Smoking cessation counseling given and has been smoking greater then 30 years -Continue nicotine patch and nicotine gum   Discharge Instructions Discharge Instructions     Call MD for:  difficulty breathing, headache or visual disturbances   Complete by: As directed    Call MD for:  extreme fatigue   Complete by: As directed    Call MD for:  hives   Complete by: As directed    Call MD for:  persistant dizziness or light-headedness   Complete by: As directed    Call MD for:  persistant nausea and vomiting   Complete by: As directed    Call MD for:  redness, tenderness, or signs of infection (pain, swelling, redness, odor or green/yellow discharge around incision site)   Complete by: As directed  Call MD for:  severe uncontrolled pain   Complete by: As directed    Call MD for:  temperature >100.4   Complete by: As directed    Diet general   Complete by: As directed    Discharge instructions   Complete by: As directed    You were cared for by a hospitalist during your hospital stay. If you have any questions about your discharge medications or the care you received while you were in the hospital after you are discharged, you can call the unit and ask to speak with the hospitalist on call if the hospitalist that took care of you is not available. Once you are discharged, your primary care physician will handle any further medical issues. Please note that NO REFILLS for any discharge medications will be authorized once you are discharged, as it is imperative that you return to your primary care physician (or establish a relationship with a primary care physician if you do not have one) for your aftercare needs so that they can reassess your need for medications and monitor your lab values.  Follow up with  PCP, Medical Oncology, and Endocrinology within 1-2 weeks. Take all medications as prescribed. If symptoms change or worsen please return to the ED for evaluation   Increase activity slowly   Complete by: As directed       Allergies as of 09/22/2021       Reactions   Oxycodone Nausea And Vomiting   Pt states he can not take any narcotic pain pills   Codeine Nausea And Vomiting   All Narcotics   Lisinopril    Throat swell up   Oxycodone Hcl Nausea And Vomiting        Medication List     STOP taking these medications    atenolol 25 MG tablet Commonly known as: TENORMIN   prochlorperazine 10 MG tablet Commonly known as: COMPAZINE       TAKE these medications    acetaminophen 500 MG tablet Commonly known as: TYLENOL Take 500 mg by mouth every 6 (six) hours as needed for moderate pain.   aspirin 81 MG tablet Take 81 mg by mouth at bedtime.   calcium carbonate 500 MG chewable tablet Commonly known as: TUMS - dosed in mg elemental calcium Chew 2 tablets by mouth daily as needed for indigestion or heartburn.   diphenhydrAMINE 25 MG tablet Commonly known as: BENADRYL Take 50 mg by mouth daily as needed for allergies.   feeding supplement Liqd Take 237 mLs by mouth daily.   ferrous sulfate 325 (65 FE) MG tablet Take 325 mg by mouth every Monday, Wednesday, and Friday.   folic acid 1 MG tablet Commonly known as: FOLVITE Take 1 tablet (1 mg total) by mouth daily. Start taking on: September 23, 2021   hydrocortisone 5 MG tablet Commonly known as: CORTEF Take 1 tablet (5 mg total) by mouth daily. Start taking on: September 23, 2021   lidocaine-prilocaine cream Commonly known as: EMLA Apply to the Port-A-Cath site 30-60 minutes before treatment   megestrol 625 MG/5ML suspension Commonly known as: Megace ES Take 5 mLs (625 mg total) by mouth daily.   nicotine 21 mg/24hr patch Commonly known as: NICODERM CQ - dosed in mg/24 hours Place 1 patch (21 mg total)  onto the skin daily. Start taking on: September 23, 2021   nitroGLYCERIN 0.4 MG SL tablet Commonly known as: NITROSTAT Place 0.4 mg under the tongue every 5 (five) minutes as needed for  chest pain.   omeprazole 10 MG capsule Commonly known as: PRILOSEC Take 10 mg by mouth daily.   ondansetron 8 MG tablet Commonly known as: ZOFRAN Take 1 tablet (8 mg total) by mouth every 8 (eight) hours as needed for nausea or vomiting.   oxymetazoline 0.05 % nasal spray Commonly known as: AFRIN Place 1 spray into both nostrils 2 (two) times daily as needed for congestion.   rosuvastatin 10 MG tablet Commonly known as: CRESTOR Take 10 mg by mouth every evening.   sulfamethoxazole-trimethoprim 800-160 MG tablet Commonly known as: BACTRIM DS Take 1 tablet by mouth 2 (two) times daily.               Durable Medical Equipment  (From admission, onward)           Start     Ordered   09/14/21 1422  For home use only DME 3 n 1  Once        09/14/21 1421            Follow-up Information     [name], MD. Call.   Specialty: Family Medicine Why: Follow up within 1-2 weeks Contact information: [place] [address] [phone]         Curt Bears, MD. Call.   Specialty: Oncology Why: Follow up within 1-2 weeks Contact information: [place] [address] [phone]                Allergies  Allergen Reactions   Oxycodone Nausea And Vomiting    Pt states he can not take any narcotic pain pills   Codeine Nausea And Vomiting    All Narcotics   Lisinopril     Throat swell up   Oxycodone Hcl Nausea And Vomiting   Consultations: Medical Oncology  Procedures/Studies: DG Pelvis 1-2 Views  Result Date: 09/13/2021 CLINICAL DATA:  Pain after fall. EXAM: PELVIS - 1-2 VIEW COMPARISON:  None. FINDINGS: There is no evidence of pelvic fracture or diastasis. No pelvic bone lesions are seen.  IMPRESSION: Negative. Electronically Signed   By: Dorise Bullion III M.D.   On: 09/13/2021 17:56   DG Forearm Left  Result Date: 09/14/2021 CLINICAL DATA:  Fall on the floor. Abrasion on the posterior aspect of the left elbow EXAM: LEFT FOREARM - 2 VIEW COMPARISON:  None. FINDINGS: There is no evidence of fracture or other focal bone lesions. Soft tissues are unremarkable. IMPRESSION: No acute fracture or dislocation. Electronically Signed   By: Keane Police D.O.   On: 09/14/2021 17:27   CT HEAD WO CONTRAST (5MM)  Result Date: 09/14/2021 CLINICAL DATA:  Recent fall, initial encounter EXAM: CT HEAD WITHOUT CONTRAST TECHNIQUE: Contiguous axial images were obtained from the base of the skull through the vertex without intravenous contrast. COMPARISON:  09/09/2021 FINDINGS: Brain: No evidence of acute infarction, hemorrhage, hydrocephalus, extra-axial collection or mass lesion/mass effect. Chronic atrophic and ischemic changes are noted and stable from the prior exam. Vascular: No hyperdense vessel or unexpected calcification. Skull: Normal. Negative for fracture or focal lesion. Sinuses/Orbits: No acute finding. Other: None. IMPRESSION: Chronic atrophic and ischemic changes without acute abnormality. Electronically Signed   By: Inez Catalina M.D.   On: 09/14/2021 18:13   CT Head Wo Contrast  Result Date: 09/09/2021 CLINICAL DATA:  [place], hit head EXAM: CT HEAD WITHOUT CONTRAST TECHNIQUE: Contiguous axial images were obtained from the base of the skull through the vertex without intravenous contrast.  COMPARISON:  08/29/2021 FINDINGS: Brain: No acute infarct or hemorrhage. Lateral ventricles and midline structures are stable. Chronic small vessel ischemic changes are again seen throughout the periventricular and subcortical white matter. No acute extra-axial fluid collections. No mass effect. Vascular: Stable atherosclerosis.  No hyperdense vessel. Skull: Normal. Negative for fracture or focal lesion.  Sinuses/Orbits: No acute finding. Other: None. IMPRESSION: 1. Stable head CT, no acute intracranial process. Electronically Signed   By: Randa Ngo M.D.   On: 09/09/2021 16:55   CT Head Wo Contrast  Result Date: 08/29/2021 CLINICAL DATA:  Mental status change. Leg weakness for 2 days. History of small-cell lung cancer. EXAM: CT HEAD WITHOUT CONTRAST TECHNIQUE: Contiguous axial images were obtained from the base of the skull through the vertex without intravenous contrast. COMPARISON:  MR head 05/26/2021 FINDINGS: Brain: No evidence of acute infarction, hemorrhage, hydrocephalus, extra-axial collection or mass lesion/mass effect. Hypodensities in the periventricular and subcortical white matter, consistent with chronic small-vessel ischemic changes. Vascular: No hyperdense vessel or unexpected calcification. Skull: Normal. Negative for fracture or focal lesion. Sinuses/Orbits: No acute finding. Other: None. IMPRESSION: No acute intracranial abnormality. Electronically Signed   By: Ileana Roup M.D.   On: 08/29/2021 10:56   CT Cervical Spine Wo Contrast  Result Date: 09/09/2021 CLINICAL DATA:  [place], hit head EXAM: CT CERVICAL SPINE WITHOUT CONTRAST TECHNIQUE: Multidetector CT imaging of the cervical spine was performed without intravenous contrast. Multiplanar CT image reconstructions were also generated. COMPARISON:  None. FINDINGS: Alignment: There is reversal of cervical lordosis centered at C4, likely due to multilevel facet hypertrophy and spondylosis. Otherwise alignment is anatomic. Skull base and vertebrae: No acute fracture. No primary bone lesion or focal pathologic process. Soft tissues and spinal canal: No prevertebral fluid or swelling. No visible canal hematoma. Marked atherosclerosis at the carotid bifurcations. Right internal jugular catheter partially visualized. Disc levels: Prominent hypertrophic changes at the C1-C2 interface. Mild spondylosis from C3 through C6. Prominent facet  hypertrophic changes at C2-3 and C3-4. Upper chest: Airway is patent. Emphysematous changes are seen at the lung apices. Stable 4 mm left apical nodule. Other: Reconstructed images demonstrate no additional findings. IMPRESSION: 1. No acute cervical spine fracture. 2. Multilevel spondylosis and facet hypertrophy. Electronically Signed   By: Randa Ngo M.D.   On: 09/09/2021 16:57   MR Brain W and Wo Contrast  Result Date: 09/13/2021 CLINICAL DATA:  Weakness and fatigue, history of lung cancer EXAM: MRI HEAD WITHOUT AND WITH CONTRAST TECHNIQUE: Multiplanar, multiecho pulse sequences of the brain and surrounding structures were obtained without and with intravenous contrast. CONTRAST:  56m GADAVIST GADOBUTROL 1 MMOL/ML IV SOLN COMPARISON:  CT head 09/09/2021, MR head 05/16/2021 FINDINGS: Brain: There is no evidence of acute intracranial hemorrhage, extra-axial fluid collection, or acute infarct. There is mild-to-moderate global parenchymal volume loss. Patchy FLAIR signal abnormality throughout the subcortical and periventricular white matter likely reflects sequela of moderate chronic white matter microangiopathy. Foci of SWI signal dropout in the left frontal lobe anteriorly and left temporal lobe laterally are unchanged consistent with old blood products probably related to prior metastatic disease. The previously seen enhancing lesion in the right cerebellar hemisphere is no longer identified. SWI signal dropout is seen in this location consistent with old blood products related to the previously metastatic lesion. The previously seen enhancing lesion in the medial right temporal lobe is also no longer identified. There was likely a lesion n in the medial right frontal lobe on the prior study which is also no  longer present. There is no evidence of active intracranial metastatic disease on the current study. Vascular: Normal flow voids. Skull and upper cervical spine: Normal marrow signal. Sinuses/Orbits:  The paranasal sinuses are clear. The globes and orbits are unremarkable. Other: None. IMPRESSION: 1. Previously seen enhancing lesions in the right cerebellar hemisphere, right frontal lobe, and right medial temporal lobe are no longer identified. No evidence of active intracranial metastatic disease on the current study. 2. No other acute intracranial pathology. 3. Unchanged global parenchymal volume loss and moderate chronic white matter microangiopathy. Electronically Signed   By: Valetta Mole M.D.   On: 09/13/2021 19:04   DG Chest Port 1 View  Result Date: 09/13/2021 CLINICAL DATA:  Altered level of consciousness. EXAM: PORTABLE CHEST 1 VIEW COMPARISON:  Chest x-ray 08/29/2021. FINDINGS: Right chest port catheter tip projects over the mid SVC, unchanged. The cardiomediastinal silhouette is stable and within normal limits. Large hiatal hernia is again noted. The lungs are clear. There is no pleural effusion or pneumothorax. No acute fractures are seen. IMPRESSION: 1. No active disease. 2. Stable large hiatal hernia. Electronically Signed   By: Ronney Asters M.D.   On: 09/13/2021 15:17   DG Chest Portable 1 View  Result Date: 08/29/2021 CLINICAL DATA:  Altered mental status. EXAM: PORTABLE CHEST 1 VIEW COMPARISON:  Chest radiograph 01/23/2021; CT chest 08/11/2021 FINDINGS: Right IJ power injectable port central venous catheter tip projects at the level of the mid SVC. The heart size and mediastinal contours are within normal limits. Aortic calcifications. Both lungs are clear. Hiatal hernia. Sclerotic lesion an old fracture of the posterior right seventh rib, better appreciated on recent cross-sectional imaging. No acute osseous abnormality. Surgical clips overlie the upper chest wall bilaterally. IMPRESSION: 1. No acute cardiopulmonary abnormality. 2. Sclerotic lesion and healing fracture of the posterior right seventh rib, better appreciated on recent cross-sectional imaging. 3. Aortic Atherosclerosis  (ICD10-I70.0). 4. Hiatal hernia, stable. Electronically Signed   By: Ileana Roup M.D.   On: 08/29/2021 09:51   DG Hand Complete Right  Result Date: 09/09/2021 CLINICAL DATA:  Fall, hand pain, laceration EXAM: RIGHT HAND - COMPLETE 3+ VIEW COMPARISON:  None. FINDINGS: No acute bony abnormality. Specifically, no fracture, subluxation, or dislocation. Joint spaces maintained. Old ulnar styloid fracture. Soft tissues are intact. IMPRESSION: No acute bony abnormality. Electronically Signed   By: Rolm Baptise M.D.   On: 09/09/2021 17:29   DG Hip Unilat W or Wo Pelvis 2-3 Views Left  Result Date: 09/13/2021 CLINICAL DATA:  Pain with weight-bearing. EXAM: DG HIP (WITH OR WITHOUT PELVIS) 2-3V LEFT COMPARISON:  None. FINDINGS: There is no evidence of hip fracture or dislocation. Mild degenerative changes are noted in the form of acetabular sclerosis. Marked severity vascular calcification is seen. IMPRESSION: 1. No acute fracture or dislocation. 2. Mild degenerative changes. Electronically Signed   By: Virgina Norfolk M.D.   On: 09/13/2021 19:48     Subjective: Seen and examined at bedside and was doing well and had just walk with therapy.  Denies any chest pain or shortness of breath or lightheadedness or dizziness. Repeat orthostatic vital signs done by the therapist and he did not drop.  He felt well and was wanting to go to SNF now.  Discharge Exam: Vitals:   09/22/21 0917 09/22/21 1223  BP: 122/67 102/61  Pulse: 83 86  Resp:  19  Temp:  98.3 F (36.8 C)  SpO2: 95% 93%   Vitals:   09/22/21 0545 09/22/21 0640 09/22/21  0017 09/22/21 1223  BP: 127/77  122/67 102/61  Pulse: 74  83 86  Resp:  17  19  Temp: 98.9 F (37.2 C)   98.3 F (36.8 C)  TempSrc: Oral   Oral  SpO2: 94%  95% 93%  Weight:      Height:       General: Pt is alert, awake, not in acute distress Cardiovascular: Diminished, S1/S2 +, no rubs, no gallops; Unlabored breathing  Respiratory: CTA bilaterally, no wheezing, no  rhonchi Abdominal: Soft, NT, ND, bowel sounds + Extremities: no edema, no cyanosis  The results of significant diagnostics from this hospitalization (including imaging, microbiology, ancillary and laboratory) are listed below for reference.    Microbiology: Recent Results (from the past 240 hour(s))  Resp Panel by RT-PCR (Flu A&B, Covid) Nasopharyngeal Swab     Status: None   Collection Time: 09/13/21  8:46 PM   Specimen: Nasopharyngeal Swab; Nasopharyngeal(NP) swabs in vial transport medium  Result Value Ref Range Status   SARS Coronavirus 2 by RT PCR NEGATIVE NEGATIVE Final    Comment: (NOTE) SARS-CoV-2 target nucleic acids are NOT DETECTED.  The SARS-CoV-2 RNA is generally detectable in upper respiratory specimens during the acute phase of infection. The lowest concentration of SARS-CoV-2 viral copies this assay can detect is 138 copies/mL. A negative result does not preclude SARS-Cov-2 infection and should not be used as the sole basis for treatment or other patient management decisions. A negative result may occur with  improper specimen collection/handling, submission of specimen other than nasopharyngeal swab, presence of viral mutation(s) within the areas targeted by this assay, and inadequate number of viral copies(<138 copies/mL). A negative result must be combined with clinical observations, patient history, and epidemiological information. The expected result is Negative.  Fact Sheet for Patients:  EntrepreneurPulse.com.au  Fact Sheet for Healthcare Providers:  IncredibleEmployment.be  This test is no t yet approved or cleared by the [place] FDA and  has been authorized for detection and/or diagnosis of SARS-CoV-2 by FDA under an Emergency Use Authorization (EUA). This EUA will remain  in effect (meaning this test can be used) for the duration of the COVID-19 declaration under Section 564(b)(1) of the Act, 21 U.S.C.section  360bbb-3(b)(1), unless the authorization is terminated  or revoked sooner.       Influenza A by PCR NEGATIVE NEGATIVE Final   Influenza B by PCR NEGATIVE NEGATIVE Final    Comment: (NOTE) The Xpert Xpress SARS-CoV-2/FLU/RSV plus assay is intended as an aid in the diagnosis of influenza from Nasopharyngeal swab specimens and should not be used as a sole basis for treatment. Nasal washings and aspirates are unacceptable for Xpert Xpress SARS-CoV-2/FLU/RSV testing.  Fact Sheet for Patients: EntrepreneurPulse.com.au  Fact Sheet for Healthcare Providers: IncredibleEmployment.be  This test is not yet approved or cleared by the [place] FDA and has been authorized for detection and/or diagnosis of SARS-CoV-2 by FDA under an Emergency Use Authorization (EUA). This EUA will remain in effect (meaning this test can be used) for the duration of the COVID-19 declaration under Section 564(b)(1) of the Act, 21 U.S.C. section 360bbb-3(b)(1), unless the authorization is terminated or revoked.  Performed at [facility], [place] [address] [place], [place] [address]   Resp Panel by RT-PCR (Flu A&B, Covid) Nasopharyngeal Swab     Status: None   Collection Time: 09/22/21 11:32 AM   Specimen: Nasopharyngeal Swab; Nasopharyngeal(NP) swabs in vial transport medium  Result Value Ref Range Status   SARS Coronavirus  2 by RT PCR NEGATIVE NEGATIVE Final    Comment: (NOTE) SARS-CoV-2 target nucleic acids are NOT DETECTED.  The SARS-CoV-2 RNA is generally detectable in upper respiratory specimens during the acute phase of infection. The lowest concentration of SARS-CoV-2 viral copies this assay can detect is 138 copies/mL. A negative result does not preclude SARS-Cov-2 infection and should not be used as the sole basis for treatment or other patient management decisions. A negative result may occur with  improper specimen collection/handling,  submission of specimen other than nasopharyngeal swab, presence of viral mutation(s) within the areas targeted by this assay, and inadequate number of viral copies(<138 copies/mL). A negative result must be combined with clinical observations, patient history, and epidemiological information. The expected result is Negative.  Fact Sheet for Patients:  EntrepreneurPulse.com.au  Fact Sheet for Healthcare Providers:  IncredibleEmployment.be  This test is no t yet approved or cleared by the [place] FDA and  has been authorized for detection and/or diagnosis of SARS-CoV-2 by FDA under an Emergency Use Authorization (EUA). This EUA will remain  in effect (meaning this test can be used) for the duration of the COVID-19 declaration under Section 564(b)(1) of the Act, 21 U.S.C.section 360bbb-3(b)(1), unless the authorization is terminated  or revoked sooner.       Influenza A by PCR NEGATIVE NEGATIVE Final   Influenza B by PCR NEGATIVE NEGATIVE Final    Comment: (NOTE) The Xpert Xpress SARS-CoV-2/FLU/RSV plus assay is intended as an aid in the diagnosis of influenza from Nasopharyngeal swab specimens and should not be used as a sole basis for treatment. Nasal washings and aspirates are unacceptable for Xpert Xpress SARS-CoV-2/FLU/RSV testing.  Fact Sheet for Patients: EntrepreneurPulse.com.au  Fact Sheet for Healthcare Providers: IncredibleEmployment.be  This test is not yet approved or cleared by the [place] FDA and has been authorized for detection and/or diagnosis of SARS-CoV-2 by FDA under an Emergency Use Authorization (EUA). This EUA will remain in effect (meaning this test can be used) for the duration of the COVID-19 declaration under Section 564(b)(1) of the Act, 21 U.S.C. section 360bbb-3(b)(1), unless the authorization is terminated or revoked.  Performed at [facility], [place] [address], [place], [place] [address]     Labs: BNP (last 3 results) No results for input(s): BNP in the last 8760 hours. Basic Metabolic Panel: Recent Labs  Lab 09/18/21 0342 09/19/21 0346 09/20/21 0435 09/21/21 0340 09/22/21 0338  NA 138 136 140 138 138  K 3.7 4.9 4.1 4.1 4.2  CL 107 111 113* 114* 114*  CO2 26 22 21* 21* 20*  GLUCOSE 101* 103* 123* 97 95  BUN 16 22 20 22 19   CREATININE 1.00 1.08 0.89 0.93 1.03  CALCIUM 8.3* 8.3* 8.6* 8.6* 8.5*  MG 1.7 2.0 1.9 1.8 1.8  PHOS 2.9 2.9 3.1 2.9 3.2   Liver Function Tests: Recent Labs  Lab 09/18/21 0342 09/19/21 0346 09/20/21 0435 09/21/21 0340 09/22/21 0338  AST 11* 10* 12* 9* 10*  ALT 7 8 8 11 10   ALKPHOS 32* 36* 31* 34* 34*  BILITOT 0.7 0.6 0.6 0.6 0.7  PROT 5.0* 4.9* 5.2* 4.9* 4.9*  ALBUMIN 3.4* 3.1* 3.3* 3.0* 3.1*   No results for input(s): LIPASE, AMYLASE in the last 168 hours. No results for input(s): AMMONIA in the last 168 hours. CBC: Recent Labs  Lab 09/18/21 0342 09/19/21 0346 09/20/21 0435 09/21/21 0340 09/22/21 0338  WBC 2.8* 3.4* 3.1* 3.6* 3.4*  NEUTROABS 2.3 2.9 2.6 3.1 2.8  HGB 8.6* 9.2* 9.4* 9.5* 9.4*  HCT 26.0* 28.3* 28.5* 29.6* 29.5*  MCV 96.3 97.9 98.3 99.3 100.3*  PLT 78* 90* 90* 86* 89*   Cardiac Enzymes: No results for input(s): CKTOTAL, CKMB, CKMBINDEX, TROPONINI in the last 168 hours. BNP: Invalid input(s): POCBNP CBG: No results for input(s): GLUCAP in the last 168 hours. D-Dimer No results for input(s): DDIMER in the last 72 hours. Hgb A1c No results for input(s): HGBA1C in the last 72 hours. Lipid Profile No results for input(s): CHOL, HDL, LDLCALC, TRIG, CHOLHDL, LDLDIRECT in the last 72 hours. Thyroid function studies No results for input(s): TSH, T4TOTAL, T3FREE, THYROIDAB in the last 72 hours.  Invalid input(s): FREET3 Anemia work up Recent Labs    09/21/21 0340  VITAMINB12 95*  FOLATE 5.5*  FERRITIN 158  TIBC 170*  IRON 64  RETICCTPCT 3.0    Urinalysis    Component Value Date/Time   COLORURINE YELLOW 09/13/2021 2145   APPEARANCEUR CLOUDY (A) 09/13/2021 2145   LABSPEC 1.023 09/13/2021 2145   PHURINE 5.0 09/13/2021 2145   GLUCOSEU NEGATIVE 09/13/2021 2145   HGBUR NEGATIVE 09/13/2021 2145   BILIRUBINUR NEGATIVE 09/13/2021 2145   KETONESUR NEGATIVE 09/13/2021 2145   PROTEINUR NEGATIVE 09/13/2021 2145   UROBILINOGEN 0.2 07/22/2010 1113   NITRITE NEGATIVE 09/13/2021 2145   LEUKOCYTESUR NEGATIVE 09/13/2021 2145   Sepsis Labs Invalid input(s): PROCALCITONIN,  WBC,  LACTICIDVEN Microbiology Recent Results (from the past 240 hour(s))  Resp Panel by RT-PCR (Flu A&B, Covid) Nasopharyngeal Swab     Status: None   Collection Time: 09/13/21  8:46 PM   Specimen: Nasopharyngeal Swab; Nasopharyngeal(NP) swabs in vial transport medium  Result Value Ref Range Status   SARS Coronavirus 2 by RT PCR NEGATIVE NEGATIVE Final    Comment: (NOTE) SARS-CoV-2 target nucleic acids are NOT DETECTED.  The SARS-CoV-2 RNA is generally detectable in upper respiratory specimens during the acute phase of infection. The lowest concentration of SARS-CoV-2 viral copies this assay can detect is 138 copies/mL. A negative result does not preclude SARS-Cov-2 infection and should not be used as the sole basis for treatment or other patient management decisions. A negative result may occur with  improper specimen collection/handling, submission of specimen other than nasopharyngeal swab, presence of viral mutation(s) within the areas targeted by this assay, and inadequate number of viral copies(<138 copies/mL). A negative result must be combined with clinical observations, patient history, and epidemiological information. The expected result is Negative.  Fact Sheet for Patients:  EntrepreneurPulse.com.au  Fact Sheet for Healthcare Providers:  IncredibleEmployment.be  This test is no t yet approved or cleared by  the [place] FDA and  has been authorized for detection and/or diagnosis of SARS-CoV-2 by FDA under an Emergency Use Authorization (EUA). This EUA will remain  in effect (meaning this test can be used) for the duration of the COVID-19 declaration under Section 564(b)(1) of the Act, 21 U.S.C.section 360bbb-3(b)(1), unless the authorization is terminated  or revoked sooner.       Influenza A by PCR NEGATIVE NEGATIVE Final   Influenza B by PCR NEGATIVE NEGATIVE Final    Comment: (NOTE) The Xpert Xpress SARS-CoV-2/FLU/RSV plus assay is intended as an aid in the diagnosis of influenza from Nasopharyngeal swab specimens and should not be used as a sole basis for treatment. Nasal washings and aspirates are unacceptable for Xpert Xpress SARS-CoV-2/FLU/RSV testing.  Fact Sheet for Patients: EntrepreneurPulse.com.au  Fact Sheet for Healthcare Providers: IncredibleEmployment.be  This test is not yet approved or  cleared by the [place] and has been authorized for detection and/or diagnosis of SARS-CoV-2 by FDA under an Emergency Use Authorization (EUA). This EUA will remain in effect (meaning this test can be used) for the duration of the COVID-19 declaration under Section 564(b)(1) of the Act, 21 U.S.C. section 360bbb-3(b)(1), unless the authorization is terminated or revoked.  Performed at [facility], [place] [address], [place], [place] [address]   Resp Panel by RT-PCR (Flu A&B, Covid) Nasopharyngeal Swab     Status: None   Collection Time: 09/22/21 11:32 AM   Specimen: Nasopharyngeal Swab; Nasopharyngeal(NP) swabs in vial transport medium  Result Value Ref Range Status   SARS Coronavirus 2 by RT PCR NEGATIVE NEGATIVE Final    Comment: (NOTE) SARS-CoV-2 target nucleic acids are NOT DETECTED.  The SARS-CoV-2 RNA is generally detectable in upper respiratory specimens during the acute phase of infection. The  lowest concentration of SARS-CoV-2 viral copies this assay can detect is 138 copies/mL. A negative result does not preclude SARS-Cov-2 infection and should not be used as the sole basis for treatment or other patient management decisions. A negative result may occur with  improper specimen collection/handling, submission of specimen other than nasopharyngeal swab, presence of viral mutation(s) within the areas targeted by this assay, and inadequate number of viral copies(<138 copies/mL). A negative result must be combined with clinical observations, patient history, and epidemiological information. The expected result is Negative.  Fact Sheet for Patients:  EntrepreneurPulse.com.au  Fact Sheet for Healthcare Providers:  IncredibleEmployment.be  This test is no t yet approved or cleared by the [place] FDA and  has been authorized for detection and/or diagnosis of SARS-CoV-2 by FDA under an Emergency Use Authorization (EUA). This EUA will remain  in effect (meaning this test can be used) for the duration of the COVID-19 declaration under Section 564(b)(1) of the Act, 21 U.S.C.section 360bbb-3(b)(1), unless the authorization is terminated  or revoked sooner.       Influenza A by PCR NEGATIVE NEGATIVE Final   Influenza B by PCR NEGATIVE NEGATIVE Final    Comment: (NOTE) The Xpert Xpress SARS-CoV-2/FLU/RSV plus assay is intended as an aid in the diagnosis of influenza from Nasopharyngeal swab specimens and should not be used as a sole basis for treatment. Nasal washings and aspirates are unacceptable for Xpert Xpress SARS-CoV-2/FLU/RSV testing.  Fact Sheet for Patients: EntrepreneurPulse.com.au  Fact Sheet for Healthcare Providers: IncredibleEmployment.be  This test is not yet approved or cleared by the [place] FDA and has been authorized for detection and/or diagnosis of SARS-CoV-2 by FDA under  an Emergency Use Authorization (EUA). This EUA will remain in effect (meaning this test can be used) for the duration of the COVID-19 declaration under Section 564(b)(1) of the Act, 21 U.S.C. section 360bbb-3(b)(1), unless the authorization is terminated or revoked.  Performed at [facility], [place] [address] [place], [place] [address]    Time coordinating discharge: 35 minutes  SIGNED:  Kerney Elbe, DO [facility] 09/22/2021, 12:35 PM Pager is on [place]  If 7PM-7AM, please contact night-coverage [email or web address]

## 2021-09-22 NOTE — TOC Progression Note (Addendum)
Transition of Care [facility]) - Progression Note    Patient Details  Name: Gregory Hodges MRN: [id number] Date of Birth: 1941/09/15  [facility] CM/SW Contact  Leeroy Cha, RN Phone Number: 09/22/2021, 11:40 AM  Clinical Narrative:    TCT-LYNETTE IN ADMISSION AT [facility]= wcb WITH BED OPPORTUNITY Dc packet with forms left at the unit clerk desk with formation and dnr form/ COVID TEST IS NEGATIVE. RN to call ptar when ready. Expected Discharge Plan: Home/Self Care Barriers to Discharge: Continued Medical Work up  Expected Discharge Plan and Services Expected Discharge Plan: Home/Self Care   Discharge Planning Services: CM Consult   Living arrangements for the past 2 months: Single Family Home                                       Social Determinants of Health (SDOH) Interventions    Readmission Risk Interventions No flowsheet data found.

## 2021-09-23 ENCOUNTER — Inpatient Hospital Stay: Admission: RE | Admit: 2021-09-23 | Payer: PPO | Source: Ambulatory Visit

## 2021-09-23 DIAGNOSIS — E872 Acidosis, unspecified: Secondary | ICD-10-CM | POA: Diagnosis not present

## 2021-09-23 DIAGNOSIS — C3490 Malignant neoplasm of unspecified part of unspecified bronchus or lung: Secondary | ICD-10-CM | POA: Diagnosis not present

## 2021-09-23 DIAGNOSIS — R296 Repeated falls: Secondary | ICD-10-CM | POA: Diagnosis not present

## 2021-09-23 DIAGNOSIS — C7931 Secondary malignant neoplasm of brain: Secondary | ICD-10-CM | POA: Diagnosis not present

## 2021-09-23 NOTE — Addendum Note (Signed)
Addended by: Pincus Large on: 09/23/2021 11:30 AM   Modules accepted: Orders

## 2021-09-24 DIAGNOSIS — S31000A Unspecified open wound of lower back and pelvis without penetration into retroperitoneum, initial encounter: Secondary | ICD-10-CM | POA: Diagnosis not present

## 2021-09-25 ENCOUNTER — Telehealth: Payer: Self-pay

## 2021-09-25 NOTE — Telephone Encounter (Addendum)
Pts wife called to advise pt is now in the [facility]. She further states they are trying to stabilize his BP so he can receive PT. She states his BP dropping is likely what was causing his falls.

## 2021-09-29 DIAGNOSIS — E612 Magnesium deficiency: Secondary | ICD-10-CM | POA: Diagnosis not present

## 2021-09-29 DIAGNOSIS — A000 Cholera due to Vibrio cholerae 01, biovar cholerae: Secondary | ICD-10-CM | POA: Diagnosis not present

## 2021-09-29 DIAGNOSIS — Z79899 Other long term (current) drug therapy: Secondary | ICD-10-CM | POA: Diagnosis not present

## 2021-10-01 DIAGNOSIS — S31000A Unspecified open wound of lower back and pelvis without penetration into retroperitoneum, initial encounter: Secondary | ICD-10-CM | POA: Diagnosis not present

## 2021-10-03 ENCOUNTER — Telehealth: Payer: Self-pay | Admitting: Internal Medicine

## 2021-10-03 ENCOUNTER — Ambulatory Visit (HOSPITAL_COMMUNITY): Payer: PPO

## 2021-10-03 ENCOUNTER — Telehealth: Payer: Self-pay

## 2021-10-03 NOTE — Telephone Encounter (Signed)
Pts wife called back stating they were advised by [facility] that his BP is the priority at this time and he needs to have this under control and stabilized before returning to the [facility]. She advised she is aware of the 10/20/21 r/s appt and will call back if the pt will not be able to make the appt.

## 2021-10-03 NOTE — Telephone Encounter (Signed)
I have left a detailed VM for pts wife to discuss pts status at the SNF and the status of the SNF transporting the pt here to the [facility] for his appts. I advised we will r/s the pt 2 weeks from now unless he states otherwise.

## 2021-10-03 NOTE — Telephone Encounter (Signed)
Sch per 1/6 secure chat, left msg

## 2021-10-06 DIAGNOSIS — I700 Atherosclerosis of aorta: Secondary | ICD-10-CM | POA: Diagnosis not present

## 2021-10-06 DIAGNOSIS — C3491 Malignant neoplasm of unspecified part of right bronchus or lung: Secondary | ICD-10-CM | POA: Diagnosis not present

## 2021-10-06 DIAGNOSIS — D696 Thrombocytopenia, unspecified: Secondary | ICD-10-CM | POA: Diagnosis not present

## 2021-10-06 DIAGNOSIS — I951 Orthostatic hypotension: Secondary | ICD-10-CM | POA: Diagnosis not present

## 2021-10-06 DIAGNOSIS — E785 Hyperlipidemia, unspecified: Secondary | ICD-10-CM | POA: Diagnosis not present

## 2021-10-06 DIAGNOSIS — E46 Unspecified protein-calorie malnutrition: Secondary | ICD-10-CM | POA: Diagnosis not present

## 2021-10-06 DIAGNOSIS — K219 Gastro-esophageal reflux disease without esophagitis: Secondary | ICD-10-CM | POA: Diagnosis not present

## 2021-10-06 DIAGNOSIS — M4050 Lordosis, unspecified, site unspecified: Secondary | ICD-10-CM | POA: Diagnosis not present

## 2021-10-06 DIAGNOSIS — C7972 Secondary malignant neoplasm of left adrenal gland: Secondary | ICD-10-CM | POA: Diagnosis not present

## 2021-10-06 DIAGNOSIS — M1612 Unilateral primary osteoarthritis, left hip: Secondary | ICD-10-CM | POA: Diagnosis not present

## 2021-10-06 DIAGNOSIS — J439 Emphysema, unspecified: Secondary | ICD-10-CM | POA: Diagnosis not present

## 2021-10-06 DIAGNOSIS — C7971 Secondary malignant neoplasm of right adrenal gland: Secondary | ICD-10-CM | POA: Diagnosis not present

## 2021-10-06 DIAGNOSIS — C7931 Secondary malignant neoplasm of brain: Secondary | ICD-10-CM | POA: Diagnosis not present

## 2021-10-06 DIAGNOSIS — E860 Dehydration: Secondary | ICD-10-CM | POA: Diagnosis not present

## 2021-10-06 DIAGNOSIS — E872 Acidosis, unspecified: Secondary | ICD-10-CM | POA: Diagnosis not present

## 2021-10-06 DIAGNOSIS — K449 Diaphragmatic hernia without obstruction or gangrene: Secondary | ICD-10-CM | POA: Diagnosis not present

## 2021-10-06 DIAGNOSIS — M47812 Spondylosis without myelopathy or radiculopathy, cervical region: Secondary | ICD-10-CM | POA: Diagnosis not present

## 2021-10-06 DIAGNOSIS — R001 Bradycardia, unspecified: Secondary | ICD-10-CM | POA: Diagnosis not present

## 2021-10-06 DIAGNOSIS — N179 Acute kidney failure, unspecified: Secondary | ICD-10-CM | POA: Diagnosis not present

## 2021-10-06 DIAGNOSIS — L89152 Pressure ulcer of sacral region, stage 2: Secondary | ICD-10-CM | POA: Diagnosis not present

## 2021-10-06 DIAGNOSIS — D630 Anemia in neoplastic disease: Secondary | ICD-10-CM | POA: Diagnosis not present

## 2021-10-06 DIAGNOSIS — I10 Essential (primary) hypertension: Secondary | ICD-10-CM | POA: Diagnosis not present

## 2021-10-06 DIAGNOSIS — Z792 Long term (current) use of antibiotics: Secondary | ICD-10-CM | POA: Diagnosis not present

## 2021-10-06 DIAGNOSIS — D61810 Antineoplastic chemotherapy induced pancytopenia: Secondary | ICD-10-CM | POA: Diagnosis not present

## 2021-10-08 ENCOUNTER — Ambulatory Visit: Payer: PPO

## 2021-10-08 ENCOUNTER — Ambulatory Visit: Payer: PPO | Admitting: Physician Assistant

## 2021-10-08 ENCOUNTER — Encounter: Payer: PPO | Admitting: Dietician

## 2021-10-08 ENCOUNTER — Encounter: Payer: PPO | Admitting: Nurse Practitioner

## 2021-10-08 ENCOUNTER — Other Ambulatory Visit: Payer: PPO

## 2021-10-08 DIAGNOSIS — Z09 Encounter for follow-up examination after completed treatment for conditions other than malignant neoplasm: Secondary | ICD-10-CM | POA: Diagnosis not present

## 2021-10-08 DIAGNOSIS — K219 Gastro-esophageal reflux disease without esophagitis: Secondary | ICD-10-CM | POA: Diagnosis not present

## 2021-10-08 DIAGNOSIS — R531 Weakness: Secondary | ICD-10-CM | POA: Diagnosis not present

## 2021-10-08 DIAGNOSIS — I959 Hypotension, unspecified: Secondary | ICD-10-CM | POA: Diagnosis not present

## 2021-10-08 DIAGNOSIS — C3490 Malignant neoplasm of unspecified part of unspecified bronchus or lung: Secondary | ICD-10-CM | POA: Diagnosis not present

## 2021-10-10 DIAGNOSIS — D61810 Antineoplastic chemotherapy induced pancytopenia: Secondary | ICD-10-CM | POA: Diagnosis not present

## 2021-10-10 DIAGNOSIS — K219 Gastro-esophageal reflux disease without esophagitis: Secondary | ICD-10-CM | POA: Diagnosis not present

## 2021-10-10 DIAGNOSIS — E46 Unspecified protein-calorie malnutrition: Secondary | ICD-10-CM | POA: Diagnosis not present

## 2021-10-10 DIAGNOSIS — E785 Hyperlipidemia, unspecified: Secondary | ICD-10-CM | POA: Diagnosis not present

## 2021-10-10 DIAGNOSIS — L89152 Pressure ulcer of sacral region, stage 2: Secondary | ICD-10-CM | POA: Diagnosis not present

## 2021-10-10 DIAGNOSIS — C7971 Secondary malignant neoplasm of right adrenal gland: Secondary | ICD-10-CM | POA: Diagnosis not present

## 2021-10-10 DIAGNOSIS — D630 Anemia in neoplastic disease: Secondary | ICD-10-CM | POA: Diagnosis not present

## 2021-10-10 DIAGNOSIS — Z792 Long term (current) use of antibiotics: Secondary | ICD-10-CM | POA: Diagnosis not present

## 2021-10-10 DIAGNOSIS — C3491 Malignant neoplasm of unspecified part of right bronchus or lung: Secondary | ICD-10-CM | POA: Diagnosis not present

## 2021-10-10 DIAGNOSIS — C7931 Secondary malignant neoplasm of brain: Secondary | ICD-10-CM | POA: Diagnosis not present

## 2021-10-10 DIAGNOSIS — I700 Atherosclerosis of aorta: Secondary | ICD-10-CM | POA: Diagnosis not present

## 2021-10-10 DIAGNOSIS — C7972 Secondary malignant neoplasm of left adrenal gland: Secondary | ICD-10-CM | POA: Diagnosis not present

## 2021-10-10 DIAGNOSIS — I951 Orthostatic hypotension: Secondary | ICD-10-CM | POA: Diagnosis not present

## 2021-10-10 DIAGNOSIS — J439 Emphysema, unspecified: Secondary | ICD-10-CM | POA: Diagnosis not present

## 2021-10-10 DIAGNOSIS — M1612 Unilateral primary osteoarthritis, left hip: Secondary | ICD-10-CM | POA: Diagnosis not present

## 2021-10-10 DIAGNOSIS — R001 Bradycardia, unspecified: Secondary | ICD-10-CM | POA: Diagnosis not present

## 2021-10-10 DIAGNOSIS — M4050 Lordosis, unspecified, site unspecified: Secondary | ICD-10-CM | POA: Diagnosis not present

## 2021-10-10 DIAGNOSIS — E860 Dehydration: Secondary | ICD-10-CM | POA: Diagnosis not present

## 2021-10-10 DIAGNOSIS — M47812 Spondylosis without myelopathy or radiculopathy, cervical region: Secondary | ICD-10-CM | POA: Diagnosis not present

## 2021-10-10 DIAGNOSIS — E872 Acidosis, unspecified: Secondary | ICD-10-CM | POA: Diagnosis not present

## 2021-10-10 DIAGNOSIS — D696 Thrombocytopenia, unspecified: Secondary | ICD-10-CM | POA: Diagnosis not present

## 2021-10-10 DIAGNOSIS — N179 Acute kidney failure, unspecified: Secondary | ICD-10-CM | POA: Diagnosis not present

## 2021-10-10 DIAGNOSIS — K449 Diaphragmatic hernia without obstruction or gangrene: Secondary | ICD-10-CM | POA: Diagnosis not present

## 2021-10-10 DIAGNOSIS — I10 Essential (primary) hypertension: Secondary | ICD-10-CM | POA: Diagnosis not present

## 2021-10-13 DIAGNOSIS — K219 Gastro-esophageal reflux disease without esophagitis: Secondary | ICD-10-CM | POA: Diagnosis not present

## 2021-10-13 DIAGNOSIS — K449 Diaphragmatic hernia without obstruction or gangrene: Secondary | ICD-10-CM | POA: Diagnosis not present

## 2021-10-13 DIAGNOSIS — M4050 Lordosis, unspecified, site unspecified: Secondary | ICD-10-CM | POA: Diagnosis not present

## 2021-10-13 DIAGNOSIS — Z792 Long term (current) use of antibiotics: Secondary | ICD-10-CM | POA: Diagnosis not present

## 2021-10-13 DIAGNOSIS — E860 Dehydration: Secondary | ICD-10-CM | POA: Diagnosis not present

## 2021-10-13 DIAGNOSIS — E785 Hyperlipidemia, unspecified: Secondary | ICD-10-CM | POA: Diagnosis not present

## 2021-10-13 DIAGNOSIS — D61810 Antineoplastic chemotherapy induced pancytopenia: Secondary | ICD-10-CM | POA: Diagnosis not present

## 2021-10-13 DIAGNOSIS — I10 Essential (primary) hypertension: Secondary | ICD-10-CM | POA: Diagnosis not present

## 2021-10-13 DIAGNOSIS — N179 Acute kidney failure, unspecified: Secondary | ICD-10-CM | POA: Diagnosis not present

## 2021-10-13 DIAGNOSIS — D630 Anemia in neoplastic disease: Secondary | ICD-10-CM | POA: Diagnosis not present

## 2021-10-13 DIAGNOSIS — R001 Bradycardia, unspecified: Secondary | ICD-10-CM | POA: Diagnosis not present

## 2021-10-13 DIAGNOSIS — L89152 Pressure ulcer of sacral region, stage 2: Secondary | ICD-10-CM | POA: Diagnosis not present

## 2021-10-13 DIAGNOSIS — E46 Unspecified protein-calorie malnutrition: Secondary | ICD-10-CM | POA: Diagnosis not present

## 2021-10-13 DIAGNOSIS — M1612 Unilateral primary osteoarthritis, left hip: Secondary | ICD-10-CM | POA: Diagnosis not present

## 2021-10-13 DIAGNOSIS — C3491 Malignant neoplasm of unspecified part of right bronchus or lung: Secondary | ICD-10-CM | POA: Diagnosis not present

## 2021-10-13 DIAGNOSIS — I951 Orthostatic hypotension: Secondary | ICD-10-CM | POA: Diagnosis not present

## 2021-10-13 DIAGNOSIS — M47812 Spondylosis without myelopathy or radiculopathy, cervical region: Secondary | ICD-10-CM | POA: Diagnosis not present

## 2021-10-13 DIAGNOSIS — E872 Acidosis, unspecified: Secondary | ICD-10-CM | POA: Diagnosis not present

## 2021-10-13 DIAGNOSIS — D696 Thrombocytopenia, unspecified: Secondary | ICD-10-CM | POA: Diagnosis not present

## 2021-10-13 DIAGNOSIS — C7971 Secondary malignant neoplasm of right adrenal gland: Secondary | ICD-10-CM | POA: Diagnosis not present

## 2021-10-13 DIAGNOSIS — I700 Atherosclerosis of aorta: Secondary | ICD-10-CM | POA: Diagnosis not present

## 2021-10-13 DIAGNOSIS — C7931 Secondary malignant neoplasm of brain: Secondary | ICD-10-CM | POA: Diagnosis not present

## 2021-10-13 DIAGNOSIS — J439 Emphysema, unspecified: Secondary | ICD-10-CM | POA: Diagnosis not present

## 2021-10-13 DIAGNOSIS — C7972 Secondary malignant neoplasm of left adrenal gland: Secondary | ICD-10-CM | POA: Diagnosis not present

## 2021-10-20 ENCOUNTER — Inpatient Hospital Stay: Payer: PPO | Admitting: Nurse Practitioner

## 2021-10-20 ENCOUNTER — Inpatient Hospital Stay: Payer: PPO | Admitting: Physician Assistant

## 2021-10-20 ENCOUNTER — Other Ambulatory Visit: Payer: PPO

## 2021-10-20 ENCOUNTER — Inpatient Hospital Stay: Payer: PPO

## 2021-10-28 ENCOUNTER — Telehealth: Payer: Self-pay

## 2021-10-28 NOTE — Telephone Encounter (Signed)
Pts wife, [name], called to cancel his 11/05/21 appts and advised the pt is still going through PT and is still too weak to come for his appts. She also asked if Dr. Orland Mustard can submit an order for port flushes because their home health nurse stated she needs the order   [name] would like to know if his appt with Dr. Julien Nordmann should be a telephone appt for now? I advised I would confirm and call her back. I have cancelled his PF w/labs.

## 2021-10-28 NOTE — Telephone Encounter (Signed)
I spoke to [place], pts wife, and advised as indicated. I have also cancelled the 11/05/21 appt with Dr. Julien Nordmann as well. [name] expressed understanding of this information.

## 2021-10-29 DIAGNOSIS — E785 Hyperlipidemia, unspecified: Secondary | ICD-10-CM | POA: Diagnosis not present

## 2021-10-29 DIAGNOSIS — R002 Palpitations: Secondary | ICD-10-CM | POA: Diagnosis not present

## 2021-10-29 DIAGNOSIS — R001 Bradycardia, unspecified: Secondary | ICD-10-CM | POA: Diagnosis not present

## 2021-10-29 DIAGNOSIS — N179 Acute kidney failure, unspecified: Secondary | ICD-10-CM | POA: Diagnosis not present

## 2021-10-29 DIAGNOSIS — D61810 Antineoplastic chemotherapy induced pancytopenia: Secondary | ICD-10-CM | POA: Diagnosis not present

## 2021-10-29 DIAGNOSIS — M4050 Lordosis, unspecified, site unspecified: Secondary | ICD-10-CM | POA: Diagnosis not present

## 2021-10-29 DIAGNOSIS — D630 Anemia in neoplastic disease: Secondary | ICD-10-CM | POA: Diagnosis not present

## 2021-10-29 DIAGNOSIS — Z792 Long term (current) use of antibiotics: Secondary | ICD-10-CM | POA: Diagnosis not present

## 2021-10-29 DIAGNOSIS — I951 Orthostatic hypotension: Secondary | ICD-10-CM | POA: Diagnosis not present

## 2021-10-29 DIAGNOSIS — J439 Emphysema, unspecified: Secondary | ICD-10-CM | POA: Diagnosis not present

## 2021-10-29 DIAGNOSIS — I10 Essential (primary) hypertension: Secondary | ICD-10-CM | POA: Diagnosis not present

## 2021-10-29 DIAGNOSIS — K219 Gastro-esophageal reflux disease without esophagitis: Secondary | ICD-10-CM | POA: Diagnosis not present

## 2021-10-29 DIAGNOSIS — I700 Atherosclerosis of aorta: Secondary | ICD-10-CM | POA: Diagnosis not present

## 2021-10-29 DIAGNOSIS — C7971 Secondary malignant neoplasm of right adrenal gland: Secondary | ICD-10-CM | POA: Diagnosis not present

## 2021-10-29 DIAGNOSIS — C3491 Malignant neoplasm of unspecified part of right bronchus or lung: Secondary | ICD-10-CM | POA: Diagnosis not present

## 2021-10-29 DIAGNOSIS — E872 Acidosis, unspecified: Secondary | ICD-10-CM | POA: Diagnosis not present

## 2021-10-29 DIAGNOSIS — D696 Thrombocytopenia, unspecified: Secondary | ICD-10-CM | POA: Diagnosis not present

## 2021-10-29 DIAGNOSIS — C7931 Secondary malignant neoplasm of brain: Secondary | ICD-10-CM | POA: Diagnosis not present

## 2021-10-29 DIAGNOSIS — E860 Dehydration: Secondary | ICD-10-CM | POA: Diagnosis not present

## 2021-10-29 DIAGNOSIS — E46 Unspecified protein-calorie malnutrition: Secondary | ICD-10-CM | POA: Diagnosis not present

## 2021-10-29 DIAGNOSIS — K449 Diaphragmatic hernia without obstruction or gangrene: Secondary | ICD-10-CM | POA: Diagnosis not present

## 2021-10-29 DIAGNOSIS — C7972 Secondary malignant neoplasm of left adrenal gland: Secondary | ICD-10-CM | POA: Diagnosis not present

## 2021-10-29 DIAGNOSIS — M1612 Unilateral primary osteoarthritis, left hip: Secondary | ICD-10-CM | POA: Diagnosis not present

## 2021-10-29 DIAGNOSIS — M47812 Spondylosis without myelopathy or radiculopathy, cervical region: Secondary | ICD-10-CM | POA: Diagnosis not present

## 2021-10-29 DIAGNOSIS — L89152 Pressure ulcer of sacral region, stage 2: Secondary | ICD-10-CM | POA: Diagnosis not present

## 2021-10-31 ENCOUNTER — Other Ambulatory Visit: Payer: Self-pay | Admitting: Medical Oncology

## 2021-10-31 ENCOUNTER — Telehealth: Payer: Self-pay | Admitting: Medical Oncology

## 2021-10-31 NOTE — Telephone Encounter (Signed)
Gregory Hodges will flush pt port a cath next tuesday  and at that visit pt will be discharged from nursing. Order sent to [facility] to sign .  Gregory Hodges [phone].

## 2021-11-05 ENCOUNTER — Ambulatory Visit: Payer: PPO

## 2021-11-05 ENCOUNTER — Other Ambulatory Visit: Payer: PPO

## 2021-11-05 ENCOUNTER — Inpatient Hospital Stay: Payer: PPO | Admitting: Internal Medicine

## 2021-11-05 ENCOUNTER — Telehealth: Payer: Self-pay | Admitting: Dietician

## 2021-11-05 ENCOUNTER — Inpatient Hospital Stay: Payer: PPO | Attending: Neurological Surgery | Admitting: Dietician

## 2021-11-05 NOTE — Telephone Encounter (Signed)
Nutrition Follow-up:  Patient receiving Imfinzi for metastatic SCLC. Last infusion on 09/09/21.  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 Patient subsequently discharged to nursing facility for rehab.   Spoke with patient via telephone. He reports continued weakness and fatigue since returning home from nursing facility. Patient reports his appetite is improving. He notes eating better than the previous few weeks. Patient reports 3 meals/day and drinking a couple of Boost. Patient would appreciate receiving coupons via mail. Patient is receiving HH PT, reports therapist to be arriving at any moment.    Medications: Megace, Hydrocortisone, Folic acid  Labs: 75/30 labs reviewed   Anthropometrics: Last weight 151 lb 10.8 oz on 12/26 improved. Patient weighed 145 lb 14.4 oz on 12/13   NUTRITION DIAGNOSIS: Food and nutrition related knowledge deficit    INTERVENTION:  Encouraged high calorie, high protein foods - pt has shake recipes Continue drinking 2-3 Boost Plus/equivalent daily - will mail coupons Encouraged snacks in between meals and at bedtime - pt has handout with ideas Continue Megace as prescribed  Patient has contact information     MONITORING, EVALUATION, GOAL: weight trends, intake    NEXT VISIT: To be scheduled with treatment plan

## 2021-11-13 DIAGNOSIS — I10 Essential (primary) hypertension: Secondary | ICD-10-CM | POA: Diagnosis not present

## 2021-11-13 DIAGNOSIS — E785 Hyperlipidemia, unspecified: Secondary | ICD-10-CM | POA: Diagnosis not present

## 2021-11-13 DIAGNOSIS — E860 Dehydration: Secondary | ICD-10-CM | POA: Diagnosis not present

## 2021-11-13 DIAGNOSIS — C7931 Secondary malignant neoplasm of brain: Secondary | ICD-10-CM | POA: Diagnosis not present

## 2021-11-13 DIAGNOSIS — C7972 Secondary malignant neoplasm of left adrenal gland: Secondary | ICD-10-CM | POA: Diagnosis not present

## 2021-11-13 DIAGNOSIS — K219 Gastro-esophageal reflux disease without esophagitis: Secondary | ICD-10-CM | POA: Diagnosis not present

## 2021-11-13 DIAGNOSIS — E872 Acidosis, unspecified: Secondary | ICD-10-CM | POA: Diagnosis not present

## 2021-11-13 DIAGNOSIS — I700 Atherosclerosis of aorta: Secondary | ICD-10-CM | POA: Diagnosis not present

## 2021-11-13 DIAGNOSIS — L89152 Pressure ulcer of sacral region, stage 2: Secondary | ICD-10-CM | POA: Diagnosis not present

## 2021-11-13 DIAGNOSIS — K449 Diaphragmatic hernia without obstruction or gangrene: Secondary | ICD-10-CM | POA: Diagnosis not present

## 2021-11-13 DIAGNOSIS — C3491 Malignant neoplasm of unspecified part of right bronchus or lung: Secondary | ICD-10-CM | POA: Diagnosis not present

## 2021-11-13 DIAGNOSIS — D696 Thrombocytopenia, unspecified: Secondary | ICD-10-CM | POA: Diagnosis not present

## 2021-11-13 DIAGNOSIS — M4050 Lordosis, unspecified, site unspecified: Secondary | ICD-10-CM | POA: Diagnosis not present

## 2021-11-13 DIAGNOSIS — R001 Bradycardia, unspecified: Secondary | ICD-10-CM | POA: Diagnosis not present

## 2021-11-13 DIAGNOSIS — E46 Unspecified protein-calorie malnutrition: Secondary | ICD-10-CM | POA: Diagnosis not present

## 2021-11-13 DIAGNOSIS — I951 Orthostatic hypotension: Secondary | ICD-10-CM | POA: Diagnosis not present

## 2021-11-13 DIAGNOSIS — C7971 Secondary malignant neoplasm of right adrenal gland: Secondary | ICD-10-CM | POA: Diagnosis not present

## 2021-11-13 DIAGNOSIS — D61810 Antineoplastic chemotherapy induced pancytopenia: Secondary | ICD-10-CM | POA: Diagnosis not present

## 2021-11-13 DIAGNOSIS — M1612 Unilateral primary osteoarthritis, left hip: Secondary | ICD-10-CM | POA: Diagnosis not present

## 2021-11-13 DIAGNOSIS — D630 Anemia in neoplastic disease: Secondary | ICD-10-CM | POA: Diagnosis not present

## 2021-11-13 DIAGNOSIS — Z792 Long term (current) use of antibiotics: Secondary | ICD-10-CM | POA: Diagnosis not present

## 2021-11-13 DIAGNOSIS — J439 Emphysema, unspecified: Secondary | ICD-10-CM | POA: Diagnosis not present

## 2021-11-13 DIAGNOSIS — M47812 Spondylosis without myelopathy or radiculopathy, cervical region: Secondary | ICD-10-CM | POA: Diagnosis not present

## 2021-11-13 DIAGNOSIS — N179 Acute kidney failure, unspecified: Secondary | ICD-10-CM | POA: Diagnosis not present

## 2021-11-18 DIAGNOSIS — R531 Weakness: Secondary | ICD-10-CM | POA: Diagnosis not present

## 2021-11-18 DIAGNOSIS — C7931 Secondary malignant neoplasm of brain: Secondary | ICD-10-CM | POA: Diagnosis not present

## 2021-11-18 DIAGNOSIS — C3490 Malignant neoplasm of unspecified part of unspecified bronchus or lung: Secondary | ICD-10-CM | POA: Diagnosis not present

## 2021-11-18 DIAGNOSIS — I951 Orthostatic hypotension: Secondary | ICD-10-CM | POA: Diagnosis not present

## 2021-11-20 DIAGNOSIS — N390 Urinary tract infection, site not specified: Secondary | ICD-10-CM | POA: Diagnosis not present

## 2021-11-20 DIAGNOSIS — R399 Unspecified symptoms and signs involving the genitourinary system: Secondary | ICD-10-CM | POA: Diagnosis not present

## 2021-11-21 DIAGNOSIS — K219 Gastro-esophageal reflux disease without esophagitis: Secondary | ICD-10-CM | POA: Diagnosis not present

## 2021-11-21 DIAGNOSIS — I10 Essential (primary) hypertension: Secondary | ICD-10-CM | POA: Diagnosis not present

## 2021-11-21 DIAGNOSIS — E785 Hyperlipidemia, unspecified: Secondary | ICD-10-CM | POA: Diagnosis not present

## 2021-11-21 DIAGNOSIS — I2510 Atherosclerotic heart disease of native coronary artery without angina pectoris: Secondary | ICD-10-CM | POA: Diagnosis not present

## 2021-11-24 ENCOUNTER — Telehealth: Payer: Self-pay | Admitting: Nurse Practitioner

## 2021-11-24 NOTE — Telephone Encounter (Signed)
Cancelled per 2/27 pt wife request, pt wife says they did not know about appt and wanted to cancel. I offered to help them reschedule but she did not want to reschedule at this time.

## 2021-11-26 ENCOUNTER — Inpatient Hospital Stay: Payer: PPO | Admitting: Dietician

## 2021-11-26 ENCOUNTER — Inpatient Hospital Stay: Payer: PPO

## 2021-11-26 ENCOUNTER — Encounter: Payer: PPO | Admitting: Nurse Practitioner

## 2021-11-26 ENCOUNTER — Inpatient Hospital Stay: Payer: PPO | Admitting: Physician Assistant

## 2021-11-26 DIAGNOSIS — C7972 Secondary malignant neoplasm of left adrenal gland: Secondary | ICD-10-CM | POA: Diagnosis not present

## 2021-11-26 DIAGNOSIS — J439 Emphysema, unspecified: Secondary | ICD-10-CM | POA: Diagnosis not present

## 2021-11-26 DIAGNOSIS — R001 Bradycardia, unspecified: Secondary | ICD-10-CM | POA: Diagnosis not present

## 2021-11-26 DIAGNOSIS — D61810 Antineoplastic chemotherapy induced pancytopenia: Secondary | ICD-10-CM | POA: Diagnosis not present

## 2021-11-26 DIAGNOSIS — E872 Acidosis, unspecified: Secondary | ICD-10-CM | POA: Diagnosis not present

## 2021-11-26 DIAGNOSIS — R296 Repeated falls: Secondary | ICD-10-CM | POA: Diagnosis not present

## 2021-11-26 DIAGNOSIS — M4050 Lordosis, unspecified, site unspecified: Secondary | ICD-10-CM | POA: Diagnosis not present

## 2021-11-26 DIAGNOSIS — C7931 Secondary malignant neoplasm of brain: Secondary | ICD-10-CM | POA: Diagnosis not present

## 2021-11-26 DIAGNOSIS — I10 Essential (primary) hypertension: Secondary | ICD-10-CM | POA: Diagnosis not present

## 2021-11-26 DIAGNOSIS — L89152 Pressure ulcer of sacral region, stage 2: Secondary | ICD-10-CM | POA: Diagnosis not present

## 2021-11-26 DIAGNOSIS — R531 Weakness: Secondary | ICD-10-CM | POA: Diagnosis not present

## 2021-11-26 DIAGNOSIS — D630 Anemia in neoplastic disease: Secondary | ICD-10-CM | POA: Diagnosis not present

## 2021-11-26 DIAGNOSIS — N179 Acute kidney failure, unspecified: Secondary | ICD-10-CM | POA: Diagnosis not present

## 2021-11-26 DIAGNOSIS — E785 Hyperlipidemia, unspecified: Secondary | ICD-10-CM | POA: Diagnosis not present

## 2021-11-26 DIAGNOSIS — K219 Gastro-esophageal reflux disease without esophagitis: Secondary | ICD-10-CM | POA: Diagnosis not present

## 2021-11-26 DIAGNOSIS — M1612 Unilateral primary osteoarthritis, left hip: Secondary | ICD-10-CM | POA: Diagnosis not present

## 2021-11-26 DIAGNOSIS — C3491 Malignant neoplasm of unspecified part of right bronchus or lung: Secondary | ICD-10-CM | POA: Diagnosis not present

## 2021-11-26 DIAGNOSIS — D696 Thrombocytopenia, unspecified: Secondary | ICD-10-CM | POA: Diagnosis not present

## 2021-11-26 DIAGNOSIS — C7971 Secondary malignant neoplasm of right adrenal gland: Secondary | ICD-10-CM | POA: Diagnosis not present

## 2021-11-26 DIAGNOSIS — K449 Diaphragmatic hernia without obstruction or gangrene: Secondary | ICD-10-CM | POA: Diagnosis not present

## 2021-11-26 DIAGNOSIS — M47812 Spondylosis without myelopathy or radiculopathy, cervical region: Secondary | ICD-10-CM | POA: Diagnosis not present

## 2021-11-26 DIAGNOSIS — E46 Unspecified protein-calorie malnutrition: Secondary | ICD-10-CM | POA: Diagnosis not present

## 2021-11-26 DIAGNOSIS — E860 Dehydration: Secondary | ICD-10-CM | POA: Diagnosis not present

## 2021-11-26 DIAGNOSIS — I951 Orthostatic hypotension: Secondary | ICD-10-CM | POA: Diagnosis not present

## 2021-11-26 DIAGNOSIS — Z792 Long term (current) use of antibiotics: Secondary | ICD-10-CM | POA: Diagnosis not present

## 2021-11-26 DIAGNOSIS — I700 Atherosclerosis of aorta: Secondary | ICD-10-CM | POA: Diagnosis not present

## 2021-11-28 ENCOUNTER — Telehealth: Payer: Self-pay | Admitting: Radiation Therapy

## 2021-11-28 DIAGNOSIS — E46 Unspecified protein-calorie malnutrition: Secondary | ICD-10-CM | POA: Diagnosis not present

## 2021-11-28 DIAGNOSIS — R531 Weakness: Secondary | ICD-10-CM | POA: Diagnosis not present

## 2021-11-28 DIAGNOSIS — E785 Hyperlipidemia, unspecified: Secondary | ICD-10-CM | POA: Diagnosis not present

## 2021-11-28 DIAGNOSIS — C3491 Malignant neoplasm of unspecified part of right bronchus or lung: Secondary | ICD-10-CM | POA: Diagnosis not present

## 2021-11-28 NOTE — Telephone Encounter (Signed)
Called to see if [name] has improved since his return home and able to come in for a follow-up brain MRI in March. [name] requested that we not set up any additional testing for her husband. He is not well enough to leave the home for visits. I will forward this message to Dr. Ida Rogue team so they are aware and also include palliative care.  ? ?Mont Dutton R.T.(R)(T) ?Radiation Special Procedures Navigator  ?

## 2021-12-04 ENCOUNTER — Telehealth: Payer: Self-pay

## 2021-12-04 NOTE — Telephone Encounter (Signed)
Spoke with patient's spouse [name] and scheduled a in person Palliative Consult for 12/05/21 @ 3 PM.  ? ?Consent obtained; updated Outlook/Netsmart/Team List and Epic.  ? ?

## 2021-12-05 ENCOUNTER — Encounter: Payer: Self-pay | Admitting: Family Medicine

## 2021-12-05 ENCOUNTER — Other Ambulatory Visit: Payer: Self-pay

## 2021-12-05 ENCOUNTER — Other Ambulatory Visit: Payer: Self-pay | Admitting: Family Medicine

## 2021-12-05 VITALS — BP 134/76 | HR 82 | Temp 97.8°F | Resp 20 | Wt 140.0 lb

## 2021-12-05 DIAGNOSIS — Z515 Encounter for palliative care: Secondary | ICD-10-CM

## 2021-12-05 DIAGNOSIS — I951 Orthostatic hypotension: Secondary | ICD-10-CM

## 2021-12-05 DIAGNOSIS — R5383 Other fatigue: Secondary | ICD-10-CM

## 2021-12-05 DIAGNOSIS — E43 Unspecified severe protein-calorie malnutrition: Secondary | ICD-10-CM

## 2021-12-05 NOTE — Progress Notes (Signed)
[facility] Consult Note Telephone: [phone]  Fax: [phone]   Date of encounter: 12/05/21 4:19 PM PATIENT NAME: Gregory Hodges [id number]   There are no phone numbers on file. DOB: January 06, 1941 MRN: [id number] PRIMARY CARE PROVIDER:    [name], MD,  [place] [address] [phone]  REFERRING PROVIDER:   [name], MD [address] [place],  [place] [address] [phone]  RESPONSIBLE PARTY:    Contact Information     Name Relation Home Work Mobile   Ra[place], Bartoletti [phone]  [phone]        I met face to face with patient and wife at home. Palliative Care was asked to follow this patient by consultation request of  [name], MD to address advance care planning and complex medical decision making. This is the initial visit.          ASSESSMENT, SYMPTOM MANAGEMENT AND PLAN / RECOMMENDATIONS:   Palliative Care Encounter Discussed goals of care and MOST, has advance directive. Refer to Hospice.  2.  Orthostatic hypotension Continue Midodrine TID and encourage fluid intake. Slow position change.  3.  Protein Calorie Malnutrition, severe 24 lb weight loss since 04/2021, despite use of Megace.  4.   Caregiver fatigue Spouse overwhelmed, looking for in home help-SW referral Address possible respite stay   Follow up Palliative Care Visit: Palliative care will refer to [facility] for further care.  This visit was coded based on medical decision making (MDM).  PPS: 30%  HOSPICE ELIGIBILITY/DIAGNOSIS:  Metastatic small cell lung cancer Dr Gregory Hodges agrees with eligibility for Hospice.  Chief Complaint:  [facility] received a referral to follow up with patient for chronic disease management in setting of SCLC with metastasis to brain and adrenals, advance directive and  defining/refining goals of care.   HISTORY OF PRESENT ILLNESS:  Gregory Hodges is a 81 y.o. 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 Had chemotherapy, immunotherapy and extensive radiation to brain and adrenals for mets and began to get weak and having repetitive falls.  Has had bowel incontinence, still continent of urine.  Sometimes leans and falls backwards. Leans to the right, weakness. Was getting care for pressure sores on his buttocks through [facility] and has been discharged.  No pain currently, sometimes has some back pain and buttocks pain at area of pressure ulcer.  Tylenol seems to help pain.  Sleeps ok, wakes up every couple of hours to void.  Currently cancer is "dormant" but not in remission and usually very aggressive per wife with no further treatment options.  Has to have Midodrine TID to treat orthostatic hypotension with adrenal insufficiency.  No trouble with blood sugar.  Still currently smokes. No CP or SOB, coughing or choking after eating or drinking.  Appetite is poor despite use of Megace and continues to have weight loss. Wife expresses desire for some help in home and they both want pt to remain in the home.  Discussed options for hospice with aide for personal care but advised both that any caregivers through Hospice would come in and out.  Wife appears tearful and states she has been sleeping on the couch in the same room with spouse to be able to assist since January and he is unable to stand without assistance. Both are agreeable to seeking hospice services.  History obtained from review of EMR, discussion with primary team, and interview with family, facility staff/caregiver and/or Gregory Hodges.  I reviewed available labs, medications, imaging, studies and related documents from the EMR.  Records reviewed and summarized above.   ROS General: NAD EYES: denies vision changes ENMT: denies dysphagia Cardiovascular: denies chest pain, denies DOE,  some lightheadedness on standing Pulmonary: denies cough, denies increased SOB Abdomen: endorses poor appetite and constipation, endorses incontinence of bowel GU: denies dysuria, endorses continence of urine MSK:  endorses increased weakness with repetitive falls Skin: has wound on buttocks Neurological: denies pain, denies insomnia Psych: Endorses depressed mood Heme/lymph/immuno: endorses bruises of BUE , no abnormal bleeding in stools  Physical Exam: Current and past weights: In August 2022 weight was 164 lbs 12.8 ounces, current weight 140 lbs. Constitutional: NAD General: frail appearing, cachectic EYES: anicteric sclera, lids intact, no discharge  ENMT: intact hearing, oral mucous membranes moist, dentition intact CV: S1S2, RRR with LUSB murmur, no LE edema Pulmonary: CTAB, no increased work of breathing, intermittent dry cough, room air Abdomen: normo-active BS + 4 quadrants, soft and non tender, no ascites GU: deferred MSK: noted sarcopenia/muscle atrophy and fat wasting in all 4 extremities with prominent ribs and vertebrae, moves all extremities, able to stand/pivot or ambulate < 25 feet assisted Skin: warm and dry, ecchymoses of BUE Neuro:  noted generalized weakness and poor sitting balance, no cognitive impairment Psych: non-anxious/depressed affect, A and O x 3 Hem/lymph/immuno: widespread bruising of BUE  CURRENT PROBLEM LIST:  Patient Active Problem List   Diagnosis Date Noted   Adrenal insufficiency ([facility]) 09/18/2021   Generalized weakness 09/13/2021   Fall at home, initial encounter 22/97/9892   Metabolic acidosis, normal anion gap (NAG) 09/13/2021   Protein calorie malnutrition ([facility]) 09/13/2021   Dehydration 09/13/2021   Acute prerenal azotemia 09/13/2021   Hyperlipemia    Hypertension    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[facility]) 05/29/2021   Brain metastases ([facility]) 03/11/2021   Difficulty urinating 01/22/2021   Hypotension 01/22/2021   Port-A-Cath in place  12/31/2020   Small cell lung cancer, right ([facility]) 12/03/2020   Encounter for antineoplastic chemotherapy 12/03/2020   Encounter for antineoplastic immunotherapy 12/03/2020   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   E coli bacteremia    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[facility])    Peripheral vascular disease, unspecified ([facility]) 10/09/2013   Yeast infection 01/26/2013   Atherosclerosis of native arteries of the extremities with intermittent claudication 03/21/2012   Wound drainage 01/13/2012   Hematuria 01/09/2011   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 08/20/2010   ACUTE AND SUBACUTE BACTERIAL ENDOCARDITIS 08/20/2010   AAA 08/20/2010   INF&INFLAM REACT DUE UNSPEC DEVICE IMPLANT&GRAFT 08/20/2010   PAST MEDICAL HISTORY:  Active Ambulatory Problems    Diagnosis Date Noted   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 08/20/2010   ACUTE AND SUBACUTE BACTERIAL ENDOCARDITIS 08/20/2010   AAA 08/20/2010   INF&INFLAM REACT DUE UNSPEC DEVICE IMPLANT&GRAFT 08/20/2010   Hematuria 01/09/2011   Wound drainage 01/13/2012   Atherosclerosis of native arteries of the extremities with intermittent claudication 03/21/2012   Yeast infection 01/26/2013   Peripheral vascular disease, unspecified ([facility]) 10/09/2013   E coli bacteremia    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HCC)    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   Small cell lung cancer, right ([facility]) 12/03/2020   Encounter for antineoplastic chemotherapy 12/03/2020   Encounter for antineoplastic immunotherapy 12/03/2020   Port-A-Cath in place 12/31/2020   Difficulty urinating 01/22/2021   Hypotension 01/22/2021   Brain metastases ([facility])  03/11/2021   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[facility]) 05/29/2021   Generalized weakness 09/13/2021   Fall at home, initial encounter 98/07/9146   Metabolic acidosis, normal anion gap (NAG) 09/13/2021   Protein calorie malnutrition ([facility]) 09/13/2021   Dehydration 09/13/2021   Acute prerenal azotemia 09/13/2021   Hyperlipemia    Hypertension    Adrenal insufficiency  ([facility]) 09/18/2021   Resolved Ambulatory Problems    Diagnosis Date Noted   No Resolved Ambulatory Problems   Past Medical History:  Diagnosis Date   AAA (abdominal aortic aneurysm)    Cancer ([facility])    Crohn's disease ([facility])    History of kidney stones    Myocardial infarction ([facility]) 08/11/2005   Peripheral vascular disease ([facility]) June 2011   SCLC (small cell lung carcinoma) ([facility]) dx'd 10/2020   SOCIAL HX:  Social History   Tobacco Use   Smoking status: Every Day    Packs/day: 1.00    Years: 30.00    Pack years: 30.00    Types: Cigarettes   Smokeless tobacco: Never  Substance Use Topics   Alcohol use: Not Currently    Comment: special ocassional   FAMILY HX: No family history on file.     Preferred Pharmacy: ALLERGIES:  Allergies  Allergen Reactions   Oxycodone Nausea And Vomiting    Pt states he can not take any narcotic pain pills   Codeine Nausea And Vomiting    All Narcotics   Lisinopril     Throat swell up   Oxycodone Hcl Nausea And Vomiting     PERTINENT MEDICATIONS:  Outpatient Encounter Medications as of 12/05/2021  Medication Sig   acetaminophen (TYLENOL) 500 MG tablet Take 500 mg by mouth every 6 (six) hours as needed for moderate pain.   aspirin 81 MG tablet Take 81 mg by mouth at bedtime.   calcium carbonate (TUMS - DOSED IN MG ELEMENTAL CALCIUM) 500 MG chewable tablet Chew 2 tablets by mouth daily as needed for indigestion or heartburn.   diphenhydrAMINE (BENADRYL) 25 MG tablet Take 50 mg by mouth daily as needed for allergies.   feeding supplement (ENSURE ENLIVE / ENSURE PLUS) LIQD Take 237 mLs by mouth daily.   ferrous sulfate 325 (65 FE) MG tablet Take 325 mg by mouth every Monday, Wednesday, and Friday.   folic acid (FOLVITE) 1 MG tablet Take 1 tablet (1 mg total) by mouth daily.   hydrocortisone (CORTEF) 5 MG tablet Take 1 tablet (5 mg total) by mouth daily.   lidocaine-prilocaine (EMLA) cream Apply to the Port-A-Cath site 30-60 minutes before  treatment   megestrol (MEGACE ES) 625 MG/5ML suspension Take 5 mLs (625 mg total) by mouth daily.   nicotine (NICODERM CQ - DOSED IN MG/24 HOURS) 21 mg/24hr patch Place 1 patch (21 mg total) onto the skin daily.   nitroGLYCERIN (NITROSTAT) 0.4 MG SL tablet Place 0.4 mg under the tongue every 5 (five) minutes as needed for chest pain.   omeprazole (PRILOSEC) 10 MG capsule Take 10 mg by mouth daily.   ondansetron (ZOFRAN) 8 MG tablet Take 1 tablet (8 mg total) by mouth every 8 (eight) hours as needed for nausea or vomiting.   oxymetazoline (AFRIN) 0.05 % nasal spray Place 1 spray into both nostrils 2 (two) times daily as needed for congestion.   rosuvastatin (CRESTOR) 10 MG tablet Take 10 mg by mouth every evening.   sulfamethoxazole-trimethoprim (BACTRIM DS) 800-160 MG tablet Take 1 tablet by mouth 2 (two) times daily.   No facility-administered encounter  medications on file as of 12/05/2021.     ---------------------------------------------------------------------------------------------------------------------------------------------------------------------------------------------------------------- Advance Care Planning/Goals of Care: Goals include to maximize quality of life and symptom management. Patient gave his permission to discuss.Our advance care planning conversation included a discussion about:    The value and importance of advance care planning  Exploration of goals of care in the event of a sudden injury or illness-wants to remain at home, does not wish to have CPR or intubation but wanted to review MOST and think about it.  Review of MOST form and that does not replace his living will but is the order used to enforce his wishes with medical providers.  CODE STATUS: Wants to be DNR/DNI but wanted time to consider MOST    Thank you for the opportunity to participate in the care of Mr. Lansdowne.  The palliative care team will continue to follow. Please call our office at  [phone] if we can be of additional assistance.   Marijo Conception, FNP-C  COVID-19 PATIENT SCREENING TOOL Asked and negative response unless otherwise noted:  Have you had symptoms of covid, tested positive or been in contact with someone with symptoms/positive test in the past 5-10 days?  no

## 2021-12-06 ENCOUNTER — Telehealth: Payer: Self-pay | Admitting: Family Medicine

## 2021-12-06 ENCOUNTER — Encounter: Payer: Self-pay | Admitting: Family Medicine

## 2021-12-06 DIAGNOSIS — R5383 Other fatigue: Secondary | ICD-10-CM | POA: Insufficient documentation

## 2021-12-06 NOTE — Telephone Encounter (Signed)
TCT wife and advised that pt is eligible for Hospice, confirmed Dr [name] of [facility] is PCP.  Advised that we have to contact PCP on Monday for his agreement with Hospice eligibility and then she will receive a call from Hospice to set up admission visit.  She states she has a dental appointment on Tuesday and has a senior coming to sit with pt while she goes.  She states they had a bad night with pt being confused "in a different way".  Pt is trying to get up independently.  Advised she can call main number for Palliative if she has concerns until admitted to Hospice services.  She verbalized appreciation for the call. ?Damaris Hippo FNP-C ?

## 2021-12-08 ENCOUNTER — Telehealth: Payer: Self-pay

## 2021-12-08 DIAGNOSIS — L8990 Pressure ulcer of unspecified site, unspecified stage: Secondary | ICD-10-CM | POA: Diagnosis not present

## 2021-12-08 DIAGNOSIS — C7931 Secondary malignant neoplasm of brain: Secondary | ICD-10-CM | POA: Diagnosis not present

## 2021-12-08 DIAGNOSIS — C3490 Malignant neoplasm of unspecified part of unspecified bronchus or lung: Secondary | ICD-10-CM | POA: Diagnosis not present

## 2021-12-08 NOTE — Telephone Encounter (Signed)
I called [name] on Friday as our office was informed that his wife stated he was declining and had cancelled appts. He let me know that [facility]'s Palliative team was coming out to see him. [facility]'s note shows they are going to be transitioning to Hospice. Our office would be glad to help any way we can.  ?

## 2021-12-27 DEATH — deceased
